# Patient Record
Sex: Male | Born: 1935 | Race: White | Hispanic: No | Marital: Married | Smoking: Former smoker
Health system: Southern US, Community
[De-identification: ages and names within clinical notes are randomized; demographics above are authoritative.]

## PROBLEM LIST (undated history)

## (undated) DIAGNOSIS — M25512 Pain in left shoulder: Secondary | ICD-10-CM

## (undated) DIAGNOSIS — I10 Essential (primary) hypertension: Secondary | ICD-10-CM

## (undated) DIAGNOSIS — M1990 Unspecified osteoarthritis, unspecified site: Secondary | ICD-10-CM

## (undated) DIAGNOSIS — I480 Paroxysmal atrial fibrillation: Secondary | ICD-10-CM

## (undated) DIAGNOSIS — H9190 Unspecified hearing loss, unspecified ear: Secondary | ICD-10-CM

## (undated) DIAGNOSIS — F419 Anxiety disorder, unspecified: Secondary | ICD-10-CM

## (undated) HISTORY — PX: HIP CLOSED REDUCTION: SHX983

## (undated) HISTORY — DX: Essential (primary) hypertension: I10

## (undated) HISTORY — PX: JOINT REPLACEMENT: SHX530

## (undated) HISTORY — DX: Paroxysmal atrial fibrillation: I48.0

## (undated) HISTORY — PX: APPENDECTOMY: SHX54

## (undated) HISTORY — PX: TONSILLECTOMY: SUR1361

## (undated) HISTORY — PX: HERNIA REPAIR: SHX51

---

## 2000-04-23 ENCOUNTER — Encounter: Payer: Self-pay | Admitting: Family Medicine

## 2000-04-23 ENCOUNTER — Encounter: Admission: RE | Admit: 2000-04-23 | Discharge: 2000-04-23 | Payer: Self-pay | Admitting: Family Medicine

## 2001-07-11 ENCOUNTER — Ambulatory Visit (HOSPITAL_COMMUNITY): Admission: RE | Admit: 2001-07-11 | Discharge: 2001-07-11 | Payer: Self-pay | Admitting: Gastroenterology

## 2001-07-11 ENCOUNTER — Encounter (INDEPENDENT_AMBULATORY_CARE_PROVIDER_SITE_OTHER): Payer: Self-pay

## 2001-10-31 ENCOUNTER — Ambulatory Visit (HOSPITAL_COMMUNITY): Admission: RE | Admit: 2001-10-31 | Discharge: 2001-10-31 | Payer: Self-pay | Admitting: Gastroenterology

## 2001-10-31 ENCOUNTER — Encounter: Payer: Self-pay | Admitting: Gastroenterology

## 2002-10-19 ENCOUNTER — Encounter: Payer: Self-pay | Admitting: Family Medicine

## 2002-10-19 ENCOUNTER — Ambulatory Visit (HOSPITAL_COMMUNITY): Admission: RE | Admit: 2002-10-19 | Discharge: 2002-10-19 | Payer: Self-pay | Admitting: Family Medicine

## 2002-11-27 ENCOUNTER — Ambulatory Visit (HOSPITAL_COMMUNITY): Admission: RE | Admit: 2002-11-27 | Discharge: 2002-11-27 | Payer: Self-pay

## 2004-03-25 ENCOUNTER — Encounter: Admission: RE | Admit: 2004-03-25 | Discharge: 2004-03-25 | Payer: Self-pay | Admitting: Family Medicine

## 2004-10-27 ENCOUNTER — Encounter: Admission: RE | Admit: 2004-10-27 | Discharge: 2004-10-27 | Payer: Self-pay | Admitting: Family Medicine

## 2004-11-18 ENCOUNTER — Encounter: Admission: RE | Admit: 2004-11-18 | Discharge: 2004-11-18 | Payer: Self-pay | Admitting: Family Medicine

## 2005-04-28 ENCOUNTER — Encounter: Admission: RE | Admit: 2005-04-28 | Discharge: 2005-04-28 | Payer: Self-pay | Admitting: Family Medicine

## 2005-06-10 ENCOUNTER — Inpatient Hospital Stay (HOSPITAL_COMMUNITY): Admission: RE | Admit: 2005-06-10 | Discharge: 2005-06-15 | Payer: Self-pay | Admitting: Orthopedic Surgery

## 2005-06-10 ENCOUNTER — Ambulatory Visit: Payer: Self-pay | Admitting: Physical Medicine & Rehabilitation

## 2007-01-28 ENCOUNTER — Encounter: Admission: RE | Admit: 2007-01-28 | Discharge: 2007-01-28 | Payer: Self-pay | Admitting: Family Medicine

## 2007-04-12 ENCOUNTER — Emergency Department (HOSPITAL_COMMUNITY): Admission: EM | Admit: 2007-04-12 | Discharge: 2007-04-12 | Payer: Self-pay | Admitting: Emergency Medicine

## 2007-09-12 ENCOUNTER — Ambulatory Visit (HOSPITAL_COMMUNITY): Admission: EM | Admit: 2007-09-12 | Discharge: 2007-09-12 | Payer: Self-pay | Admitting: Emergency Medicine

## 2007-10-28 ENCOUNTER — Ambulatory Visit (HOSPITAL_COMMUNITY): Admission: EM | Admit: 2007-10-28 | Discharge: 2007-10-28 | Payer: Self-pay | Admitting: Emergency Medicine

## 2007-11-17 ENCOUNTER — Inpatient Hospital Stay (HOSPITAL_COMMUNITY): Admission: RE | Admit: 2007-11-17 | Discharge: 2007-11-23 | Payer: Self-pay | Admitting: Orthopedic Surgery

## 2007-12-27 ENCOUNTER — Ambulatory Visit (HOSPITAL_COMMUNITY): Admission: EM | Admit: 2007-12-27 | Discharge: 2007-12-27 | Payer: Self-pay | Admitting: Emergency Medicine

## 2008-02-08 ENCOUNTER — Inpatient Hospital Stay (HOSPITAL_COMMUNITY): Admission: EM | Admit: 2008-02-08 | Discharge: 2008-02-14 | Payer: Self-pay | Admitting: Emergency Medicine

## 2008-02-14 ENCOUNTER — Ambulatory Visit: Payer: Self-pay | Admitting: Family Medicine

## 2010-03-16 HISTORY — PX: NM MYOVIEW LTD: HXRAD82

## 2010-07-29 NOTE — Op Note (Signed)
NAMECHOYA, TORNOW NO.:  [id number]   MEDICAL RECORD NO.:  [id number]          PATIENT TYPE:  INP   LOCATION:  0098                         FACILITY:  [facility]   PHYSICIAN:  Vanita Panda. Magnus Ivan, M.D.DATE OF BIRTH:  1927/03/21   DATE OF PROCEDURE:  10/28/2007  DATE OF DISCHARGE:  10/28/2007                               OPERATIVE REPORT   PREOPERATIVE DIAGNOSIS:  Right prosthetic hip dislocation.   POSTOPERATIVE DIAGNOSIS:  Right prosthetic hip dislocation.   PROCEDURE:  Closed reduction with manipulation of right prosthetic hip  dislocation under anesthesia.   ANESTHESIA:  General.   SURGEON:  Vanita Panda. Magnus Ivan, M.D.   COMPLICATIONS:  None.   INDICATIONS:  Briefly, Mr. Saunders is a 77 year old with a right total  hip replacement.  He has had three dislocations now of his right total  hip arthroplasty, with the last dislocation being in June 2009, and a  closed reduction with manipulation under anesthesia was performed then.  Today, he was sitting down on the ground to sit with a grandchild when  the hip popped out of place.  He had the inability to ambulate and  obvious dislocation of the hip, with shortening and internal rotation.  He was brought via EMS to the [facility] Emergency Room.  I saw him in  the emergency room and recommended that he have a closed reduction  performed.  X-rays were obtained and showed a prosthetic hip dislocation  with no obvious fracture.  Risks and benefits of closed reduction under  anesthesia were explained him, and he agreed to proceed with surgery.   PROCEDURE DESCRIPTION:  After informed consent was obtained, the  appropriate right leg was marked.  He was brought to the operating room  and placed supine on the operating room table.  General anesthesia was  then obtained.  Time-out was called, and he was identified as the  correct patient and the correct right hip.  Then, with his pelvis  stabilized, a  reduction maneuver was performed, and reduction was  performed with an audible and palpable click of the hip.  Under direct  fluoroscopic guidance, I then put the hip through a range of motion.  It  was stable.  His leg lengths were equal, and I placed his right knee in  a knee immobilizer.  He was awakened, extubated, and taken to the  recovery room in stable condition.  Postoperatively, we will allow him  to weightbear as tolerated with a knee immobilizer, with strict  adherence to posterior hip precautions.  Follow-up appointment will be  established in the office in 1-2 weeks.      Vanita Panda. Magnus Ivan, M.D.  Electronically Signed     CYB/MEDQ  D:  10/28/2007  T:  10/29/2007  Job:  [id number]

## 2010-07-29 NOTE — Op Note (Signed)
Tom Ortiz, Tom Ortiz              ACCOUNT NO.:  [id number]   MEDICAL RECORD NO.:  [id number]          PATIENT TYPE:  EMS   LOCATION:  ED                           FACILITY:  [facility]   PHYSICIAN:  Mark C. Ophelia Charter, M.D.    DATE OF BIRTH:  [birth year]   DATE OF PROCEDURE:  12/27/2007  DATE OF DISCHARGE:                               OPERATIVE REPORT   PREOPERATIVE DIAGNOSIS:  Right posterior hip dislocation.   POSTOPERATIVE DIAGNOSIS:  Right posterior hip dislocation.   PROCEDURE:  Closed reduction right total hip arthroplasty.   SURGEON:  Annell Greening, MD   ANESTHESIA:  GOT.   BRIEF HISTORY:  This 77 year old male had total hip arthroplasty by Dr.  Lajoyce Corners a couple years ago.  He has had a total of three dislocations  before this.  He has had a revision procedure with liner repositioning.  The patient was asleep lying on his left side and has right hip over the  front posture with his hip flexed and dislocated.   Preop x-ray showed posterior hip dislocation.  Good position of the  components by radiograph.   PROCEDURE:  After induction of general anesthesia with succinylcholine  given, longitudinal traction was applied with 90-90 positioning and the  hip popped in with a clunk.  There was good leg length, normal range of  motion, normal abduction, knee immobilizer applied and plain radiograph  showed the hip was reduced.  Knee immobilizer applied.  Transferred to  the recovery room.  He will stay in the knee immobilizer for 6 weeks.      Mark C. Ophelia Charter, M.D.  Electronically Signed     MCY/MEDQ  D:  12/27/2007  T:  12/28/2007  Job:  [id number]

## 2010-07-29 NOTE — Discharge Summary (Signed)
[name], Tom Ortiz              ACCOUNT NO.:  [id number]   MEDICAL RECORD NO.:  [id number]          PATIENT TYPE:  INP   LOCATION:  1306                         FACILITY:  [facility]   PHYSICIAN:  Hillery Aldo, M.D.   DATE OF BIRTH:  1927/10/28   DATE OF ADMISSION:  02/08/2008  DATE OF DISCHARGE:  02/14/2008                               DISCHARGE SUMMARY   PRIMARY CARE PHYSICIAN:  Shary Decamp, MD.   ORTHOPEDIC SURGEON:  Nadara Mustard, MD.   DISCHARGE DIAGNOSES:  1. T9-T10 compression fractures.  2. Frequent falls.  3. Failure to thrive with generalized weakness.  4. Altered mental status.  5. History of rheumatoid arthritis.  6. History of depression.  7. Small vessel cerebrovascular disease.   DISCHARGE MEDICATIONS:  1. Valium 2.5 mg p.o. b.i.d.  2. Senokot 1 tablet q.h.s.  3. Oxycodone 5 mg p.o. q.4 h. p.r.n.  4. Ambien 5 mg p.o. q.h.s. p.r.n.   CONSULTATIONS:  Nadara Mustard, MD of orthopedic surgery.   BRIEF ADMISSION HISTORY OF PRESENT ILLNESS:  The patient is a 77-year-  old male who presented to the hospital with a chief complaint of fall  and altered mental status.  The patient recently had a bilateral total  hip arthroplasty performed by Dr. Lajoyce Corners on November 17, 2007.  He was  found by his wife on the bathroom floor on the date of admission.  He  was subsequently brought to the emergency department where his wife  requested placement in a skilled nursing facility for rehab due to her  inability to adequately care for him at home.  The patient was noted to  have an unsteady gait and intermittent confusion, therefore was admitted  for further evaluation and workup.  For full details, please see the  dictated report done by Dr. Christella Noa.   PROCEDURES/DIAGNOSTIC STUDIES:  1. Two views of the thoracic spine on February 08, 2008 showed      possible new mild superior endplate compression deformities at T9-      T10 with normal alignment.  2. Right ankle films on  February 08, 2008 showed no acute osseous      findings.  3. Left hip films on February 08, 2008 showed no evidence of acute      fracture or dislocation status post bilateral total hip      arthroplasty.  4. Lumbar spine films on February 08, 2008 showed stable chronic      compression deformities and spondylosis with no evidence of acute      lumbar spine injury.  5. CT scan of the head on February 08, 2008 showed no acute      intracranial findings.  Mild chronic small vessel ischemic changes.  6. Chest x-ray on February 08, 2008 showed mild pulmonary vascular      prominence.   LABORATORY DATA:  Discharge laboratory values:  Vitamin B12 was 1296.  RBC folate was 773.  RPR was nonreactive.  TSH was 1.323.   HOSPITAL COURSE:  1. T9-T10 compression fracture:  The patient was evaluated in the      emergency  department by the orthopedic surgeon who recommended a      thoracic lumbar brace and VertAlign.  This was provided for the      patient and recommendations by physical and occupational therapist      were for skilled nursing home placement for intensive rehab.  No      further diagnostic imaging was recommended.  2. Frequent falls with failure to thrive:  Reversible causes of      dementia workup was unrevealing.  The patient's frequent falls and      failure to thrive were felt to be deconditioning in the setting of      a recent major surgery on his bilateral hips.  The patient would      benefit from skilled nursing home placement for rehab.  3. Altered mental status:  The patient's mental status alteration was      transient and likely related to pain or anxiolytic medications.  He      also has evidence of small vessel disease on CT scanning of the      head and likely has some underlying dementia.  4. History of rheumatoid arthritis:  The patient's pain is currently      well controlled.   DISPOSITION:  The patient is medically stable for transfer to a skilled  nursing  facility for further  intensive rehabilitation.   FOLLOW UP:  He should follow up with his primary care physician in 1-2  weeks' time.      Hillery Aldo, M.D.  Electronically Signed     CR/MEDQ  D:  02/14/2008  T:  02/14/2008  Job:  [id number]   cc:   Nadara Mustard, MD  Fax: [phone]   Shary Decamp, MD

## 2010-07-29 NOTE — Op Note (Signed)
NAMEJEFREY, RABURN NO.:  [id number]   MEDICAL RECORD NO.:  [id number]          PATIENT TYPE:  OBV   LOCATION:  0098                         FACILITY:  [facility]   PHYSICIAN:  Vanita Panda. Magnus Ivan, M.D.DATE OF BIRTH:  1927-11-18   DATE OF PROCEDURE:  09/12/2007  DATE OF DISCHARGE:                               OPERATIVE REPORT   PREOPERATIVE DIAGNOSIS:  Right prosthetic hip dislocation.   POSTOPERATIVE DIAGNOSIS:  Same.   PROCEDURE:  Closed reduction of right hip with manipulation under  general anesthesia.   SURGEON:  Doneen Poisson, MD   ANESTHESIA:  General.   COMPLICATIONS:  None.   INDICATIONS:  Briefly, Mr. Hausman is a 77 year old who has a right  total hip replacement that has become dislocated.  He had the original  hip placed in March 2007.  He had been doing well until it dislocated in  January 2009, and this required closed reduction maneuver.  He had been  doing well until today, when he was in his garage and he was slightly  bending over, getting onto a stool, when he felt a pop in his hip again.  He had inability to ambulate, and was brought via EMS to the [facility]  Emergency Room.  He was found to have a prosthetic hip dislocation  without evidence of fracture.  It was recommended he undergo closed  reduction of this under anesthesia.  The risks, benefits of this were  explained to him and well understood.  He agreed to proceed with  surgery.   PROCEDURE DESCRIPTION:  After informed consent was obtained, the  appropriate right leg was marked.  He was brought to the operating and  placed supine on the operating table.  General anesthesia was obtained  and a time-out was called to identify the correct patient, correct right  hip.  I then visualized the hip under direct fluoroscopy to look at the  position of the large hip ball.  I was able to perform a reduction  maneuver and get this reduced in the socket and direct  visualization,  under direct fluoroscopy put it through a range of motion.  It was  stable.  I then placed his leg in a knee immobilizer.  He was awakened,  extubated and taken to the recovery room in stable condition.  Postoperatively, I will have him follow up with Dr. Lajoyce Corners in the next  week.  He will continued the knee immobilizer at all times, except for a  shower, and will have him sleep with toes between his legs.      Vanita Panda. Magnus Ivan, M.D.  Electronically Signed     CYB/MEDQ  D:  09/12/2007  T:  09/12/2007  Job:  [id number]

## 2010-07-29 NOTE — H&P (Signed)
Tom Ortiz, Tom Ortiz              ACCOUNT NO.:  [id number]   MEDICAL RECORD NO.:  [id number]          PATIENT TYPE:  INP   LOCATION:  0113                         FACILITY:  [facility]   PHYSICIAN:  Herbie Saxon, MDDATE OF BIRTH:  September 23, 1927   DATE OF ADMISSION:  02/08/2008  DATE OF DISCHARGE:                              HISTORY & PHYSICAL   PRIMARY CARE PHYSICIAN:  Thayer Headings, M.D.   ORTHOPEDIC SURGEON:  Nadara Mustard, M.D.   CODE STATUS:  Full code.   PRESENTING COMPLAINT:  Fall with confusion, one day.   HISTORY OF PRESENTING COMPLAINT:  This 77 year old Caucasian male with a  history of rheumatoid arthritis, who is status post bilateral total hip  arthroplasty performed by Dr. Lajoyce Corners on November 17, 2007, was found by  his wife this morning on the bathroom floor.  He had apparently fallen  while trying to get to the bathroom the previous night.  He was confused  when found.  There was no obvious blunt trauma to the head, face, limbs,  or trunk.  Patient was subsequently put in the bed by the wife and  neighbor and brought to the emergency room subsequently.  Presented with  a complaint of any symptoms.  He denies any chest pain, shortness of  breath, palpitations.  No abdominal or genitourinary symptoms.  He has  had chronic intermittent right pedal swelling in the last 3 months.  Patient's wife lives alone with him.  She is keen on him being placed in  a nursing facility for rehab, as she cannot cope with him alone.  She  gives a history that he has frequent falls.  He has an unsteady gait and  is intermittently confused.   PAST MEDICAL HISTORY:  1. Rheumatoid arthritis.  2. Depression.   FAMILY HISTORY:  Mother had a CVA.  Father had rheumatoid arthritis.   SOCIAL HISTORY:  He lives with his wife.  There is no history of  alcohol, tobacco, or drug abuse.   MEDICATIONS:  Valium p.r.n.   ALLERGIES:  No known drug allergies.   PAST SURGICAL HISTORY:   Bilateral total hip arthroplasty, as previously  stated.   PHYSICAL EXAMINATION:  He is an elderly man not in acute distress.  Temperature is 100.5, pulse 95, respiratory rate 18, blood pressure  140/84.  Pupils are equal, round and reactive to light and accommodation.  Head  is normocephalic and atraumatic.  No scalp irregularity.  Oropharynx and  nasopharynx are clear.  Mucous membranes are moist.  NECK:  Supple.  No JVD or thyromegaly.  SPINE:  There is minimal tenderness over the thoracic spine.  Minimal  tenderness also over the lumbosacral spine.  Heart sounds 1 and 2, regular.  No murmurs, rubs or gallops.  CHEST:  Clinically clear.  No rales or rhonchi.  ABDOMEN:  Benign.  Bowel sounds present.  EXTREMITIES:  Reduced range of movement, especially in the right hip.  Power in the right lower extremity, 3/5.  Power in the left lower  extremity is +4/5.  Power in the right upper extremity is +4.  Power  in  the left upper extremity 4+.  There is no facial droop.  No nystagmus.  Speech is normal.  No pronator drift.  Patient is not oriented to time,  although he is oriented to person and place.  Peripheral pulses are  present.  Trace right pedal edema.   LABS:  WBC is 10.5, hematocrit 37, platelet count 256.  Troponin less  than 0.05.  INR is 1.4.  PTT is 41.  Urinalysis is negative.   X-RAYS:  1. The T-spine shows questionable new T9-10 compression fracture.  2. Right ankle x-ray is normal.  3. Left hip x-ray shows osteopenia.  Patient is status post bilateral      total hip arthroplasty.  4. Lumbosacral shows chronic compression deformities and spondylosis.  5. CT head is negative.  6. Chest x-ray shows no infiltrates.   EKG:  Normal sinus rhythm at 89 per minute.   CT head shows no acute abnormalities.   ASSESSMENT:  1. Altered mental status.  2. Failure to thrive.  3. Deconditioning.  4. Questionable new T9-10 compression fracture.  5. History of rheumatoid  arthritis.  6. Lumbosacral spondylosis.   Patient is to be admitted to a medical-surgical bed.  Will discuss the  patient's case with orthopedics, Dr. Lajoyce Corners, who advocating no MRI at  present.  He is advocating a lumbosacral spine brace and analgesics and  is also recommending rehab placement.  Patient is to be followed up with  him in a week or two as an outpatient once he is in a nursing facility.   MEDICATIONS:  1. Tylenol 650 mg p.o. p.r.n. for mild pain or fever.  2. Oxycodone 5 mg q.4-6h. p.r.n. moderate pain.  3. Dilaudid 1 mg IV q.4h. p.r.n. severe pain.  4. Lovenox 40 mg subcu daily for DVT prophylaxis.  5. Phenergan alternating with Reglan IV p.r.n. nausea.  6. Protonix 40 mg p.o. daily.   We will obtain his cardiac enzymes x2.  Check his EKG q.6h. x2.  Will  obtain his TSH, RPR, vitamin B12, and folate levels.  IV at keep vein  open.   DIET:  Regular as tolerated.   OXYGEN:  At 2-4 liters nasal cannula p.r.n.  Keep sats greater than 90.   He is to be on bedrest for the first 24 hours.  He is to be assessed for  PT/OT evaluation within the next 24 hours.  Also, speech therapy to  evaluate for increased swallowing evaluation.  He is to be on fall,  decubitus, aspiration, and seizure precautions.   His medications and tests were explained to his wife.  He and she  verbalized understanding.      Herbie Saxon, MD  Electronically Signed     MIO/MEDQ  D:  02/08/2008  T:  02/08/2008  Job:  [id number]   cc:   Thayer Headings, M.D.   Nadara Mustard, MD  Fax: [phone]

## 2010-07-29 NOTE — Op Note (Signed)
NAMEKALONJI, ZURAWSKI              ACCOUNT NO.:  [id number]   MEDICAL RECORD NO.:  [id number]          PATIENT TYPE:  INP   LOCATION:  5002                         FACILITY:  [facility]   PHYSICIAN:  Nadara Mustard, MD     DATE OF BIRTH:  [birth year]   DATE OF PROCEDURE:  11/17/2007  DATE OF DISCHARGE:                               OPERATIVE REPORT   PREOPERATIVE DIAGNOSIS:  Dislocating right hip 2 years status post total  hip arthroplasty.   POSTOPERATIVE DIAGNOSIS:  Dislocating right hip 2 years status post  total hip arthroplasty.   PROCEDURE:  Revision right total hip arthroplasty, 2 components, with a  62-mm DePuy Pinnacle acetabulum, 10-degree +4 poly liner, 36-mm head  plus a 12-mm neck.   SURGEON:  Nadara Mustard, MD   ASSISTANT:  Wende Neighbors, PA   ANESTHESIA:  General.   ESTIMATED BLOOD LOSS:  200 mL.   ANTIBIOTICS:  A 2 g of Kefzol.   DRAINS:  None.   COMPLICATIONS:  None.   DISPOSITION:  To PACU in stable condition.   INDICATIONS FOR PROCEDURE:  The patient is a 77 year old gentleman who  is 2 years status post total hip arthroplasty.  He has had 3 episodes of  dislocations most recently.  In the most recent one, the patient states  it curved when he went to sit down on the floor.  Due to his most recent  dislocations 2 years status post total hip arthroplasty, the patient  states he would like to proceed with revision total hip arthroplasty.  Risks and benefits were discussed including infection, neurovascular  injury, persistent pain, recurrent dislocation, need for additional  surgery.  The patient states he understands and wished to proceed at  this time.   DESCRIPTION OF PROCEDURE:  The patient was brought to the OR room #4 and  underwent a general anesthetic.  After adequate level of anesthesia was  obtained, the patient was placed in a left lateral decubitus position  with the right side up.  The right lower extremity was prepped using  DuraPrep and  draped in a sterile field.  Collier Flowers was used to cover all  exposed skin.  A posterolateral incision was made.  This was carried  down through the tensor fascia lata, which was split.  Examination  showed a significant amount of inflammation within the joint consistent  with the patient's history of rheumatoid arthritis.  The fluid was sent  for Gram-stain and cultures.  At the time of surgery, all cultures and  Gram-stains were negative.  The wound was irrigated with pulsatile  lavage and inflammatory tissue was removed from the knee joint.  The hip  was dislocated, and the femoral head and neck were removed.  Attention  was then focused on the acetabulum.  Using the acetabulum elevators, the  acetabulum was removed.  The acetabulum had a good coverage and the 62-  mm reamer was then used to ream down to bleeding viable subchondral  bone.  There was a good fit all the way around the acetabulum, and a 62-  mm Pinnacle acetabulum  was inserted with a trial 10-degree poly liner.  This was then tried with a 36-mm head with increasing femoral neck.  Neck offsets eventually went to a +12 head neck.  The patient had  flexion to 130 degrees.  This was stable.  He had full abduction and  internal rotation of 70 degrees and the hip was still stable.  Extension  and external rotation of the hip was also stable.  Shuck test showed  approximately 2 mm of shuck and this was stable with the shuck test.  The trial liner and trial femoral head and neck were removed.  The wound  was irrigated with pulsatile lavage throughout the case.  The 10-degree  poly liner was placed with the acetabulum at about 45 degrees of  abduction and 20 degrees of anteversion.  The poly liner was placed  superior and posterior with a 10-degree lip.  This was impacted.  The  head and neck were then inserted with a 36-mm head and a 12-mm neck.  This was inserted.  The hip was reduced.  Again placed through a full  range of motion, he  had flexion to 130 degrees.  He had full adduction  to 90 degrees and internal rotation of 70 degrees and the hip was  stable.  With the hip extended and externally rotated, the hip was also  stable.  Again, the wound was irrigated with pulsatile lavage.  The  capsule was closed.  The tensor fascia lata was closed using #1 Vicryl.  Subcu was closed using 2-0 Vicryl.  The skin was closed using Proximate  staples.  The wound was covered with a Mepilex dressing.  The patient  was extubated, transferred supine.  A pillow was placed between his  legs.  He was then taken to the PACU in stable condition.  Plan for  discharge to home.      Nadara Mustard, MD  Electronically Signed     MVD/MEDQ  D:  11/17/2007  T:  11/18/2007  Job:  [id number]

## 2010-08-01 NOTE — Discharge Summary (Signed)
Tom Ortiz, Tom Ortiz              ACCOUNT NO.:  [id number]   MEDICAL RECORD NO.:  [id number]          PATIENT TYPE:  INP   LOCATION:  5002                         FACILITY:  [facility]   PHYSICIAN:  Nadara Mustard, MD     DATE OF BIRTH:  Apr 02, 1927   DATE OF ADMISSION:  11/17/2007  DATE OF DISCHARGE:  11/23/2007                               DISCHARGE SUMMARY   FINAL DIAGNOSIS:  Unstable right total hip arthroplasty with multiple  dislocations.   PROCEDURE:  Revision total hip arthroplasty on the right with both the  acetabular and femoral components.   DISPOSITION:  Discharged to home in stable condition with home health  physical therapy.   PRESCRIPTION:  Vicodin and Coumadin.   FOLLOWUP:  In the office in 2 weeks.   HISTORY OF PRESENT ILLNESS:  The patient is a 77 year old gentleman who  is a several years status post total hip arthroplasty on the right.  The  patient started developing instability with dislocations of his hip, and  due to 3 dislocations, the patient was to proceed with revision total  hip arthroplasty.  The patient underwent a revision total hip  arthroplasty on November 17, 2007.  He had a 62-mm acetabulum placed  with a 10-degree liner with +4 polyethylene liner with a 36-mm head and  the posterior wall of the neck.  Cultures were obtained x2, which  ultimately were negative.   Postoperatively, the patient progressed slowly with his physical  therapy.  Radiographs showed stable alignment of his hip.  He continued  to work with physical therapy and was discharged to home in stable  condition on November 23, 2007, with follow up in the office in 2 weeks.      Nadara Mustard, MD  Electronically Signed     MVD/MEDQ  D:  01/04/2008  T:  01/04/2008  Job:  [id number]

## 2010-08-01 NOTE — Discharge Summary (Signed)
Tom Ortiz, Tom Ortiz              ACCOUNT NO.:  [id number]   MEDICAL RECORD NO.:  [id number]          PATIENT TYPE:  INP   LOCATION:  5023                         FACILITY:  [facility]   PHYSICIAN:  Nadara Mustard, MD     DATE OF BIRTH:  15-May-1927   DATE OF ADMISSION:  06/10/2005  DATE OF DISCHARGE:  06/15/2005                                 DISCHARGE SUMMARY   DIAGNOSIS:  Rheumatoid arthritis right hip.   PROCEDURE:  Right total hip arthroplasty.  Discharged to home in stable  condition.  Plan to follow up in the office in one week.   HISTORY OF PRESENT ILLNESS:  Patient is a 77 year old gentleman with  rheumatoid arthritis of the right hip.  Patient has failed conservative  care.  He is status post a left total hip arthroplasty in 1993 and presents  at this time for a right total hip arthroplasty.  Patient's hospital course  was essentially unremarkable.  He underwent a right total hip arthroplasty  on March 28.  He received DePuy components with metal on metal bearing  surfaces with a 62 mm acetabulum #14 femur 55 mm head with a +2 neck.  Patient received Kefzol for infection prophylaxis and Coumadin for DVT  prophylaxis.  Patient progressed well postoperatively with his physical  therapy.  Patient was felt to be safe for discharge to home with home health  therapy on April 2 with follow-up in the office in one week with Coumadin  for continued DVT prophylaxis.      Nadara Mustard, MD  Electronically Signed     MVD/MEDQ  D:  07/21/2005  T:  07/22/2005  Job:  [id number]

## 2010-08-01 NOTE — Op Note (Signed)
NAMEANTWANE, Tom Ortiz              ACCOUNT NO.:  [id number]   MEDICAL RECORD NO.:  [id number]          PATIENT TYPE:  INP   LOCATION:  2899                         FACILITY:  [facility]   PHYSICIAN:  Nadara Mustard, MD     DATE OF BIRTH:  Oct 16, 1927   DATE OF PROCEDURE:  06/10/2005  DATE OF DISCHARGE:                                 OPERATIVE REPORT   PREOP DIAGNOSIS:  Rheumatoid arthritis right hip.   POSTOP DIAGNOSIS:  Rheumatoid arthritis right hip.   PROCEDURE:  Right total hip arthroplasty with DePuy components 62 mm metal-  on-metal acetabulum.  A size 14 Corail femur 55 mm metal head with a +2 high  offset neck.   SURGEON:  Nadara Mustard, MD   ANESTHESIA:  General.   ESTIMATED BLOOD LOSS:  Minimal.   ANTIBIOTICS:  1 gram Kefzol.   DRAINS:  None.   COMPLICATIONS:  None.   DISPOSITION:  To PACU in stable condition.   INDICATIONS FOR PROCEDURE:  The patient is a 77 year old gentleman with  rheumatoid arthritis status post a left total hip arthroplasty.  The patient  has had persistent mechanical symptoms of his right hip unable to perform  activities of daily living due to pain and wish to proceed with total hip  arthroplasty at this time.  Risks and benefits were discussed including  infection, neurovascular injury, persistent pain, dislocation, DVT,  pulmonary embolus.  The patient states he understands and wished to proceed  at this time.   DESCRIPTION OF PROCEDURE:  The patient was brought to OR room 1 and  underwent a general anesthetic.  After adequate level of anesthesia  obtained, the patient was placed in the left lateral decubitus position with  the right side up and the right lower extremity was prepped using DuraPrep  and draped in a sterile field.  Collier Flowers was used to cover all exposed skin.  A  posterior lateral incision was made.  This was carried down.  The tensor  fascia lata was split and a Charnley retractor was placed.  The piriformis  and short  external rotators were tagged, cut and retracted.  The capsule was  teed, tagged, cut and retracted.  The sciatic nerve was protected throughout  the case.  The hip was dislocated and the femoral neck cut was made 1 cm  proximal to calcar in alignment with the femoral template.  Attention was  first paid to the acetabulum.  Large loose bodies were removed from the  acetabulum.  The labrum was excised.  The acetabulum was sequentially reamed  up to 62 mm.  The 62 mm ASR cup was inserted at approximately 45 degrees of  abduction and approximately a 20 degrees of flexion.  Attention was then  focused on the femur.  The femur was sequentially broached up to a size 14.  This was trialed and had good stability with flexion of the 110 degrees,  full abduction and internal rotation of approximately 50 degrees and this  was stable.  This was tried with both the standard and high offset and the  high offset  had more stability.  This trial was removed.  The final femoral  stem was inserted with a 55-mm head, high offset +2 neck.  Again the hip was  placed through full range of motion and there was improved range of motion.  There was flexion to 110 degrees, full abduction with internal rotation of  70 degrees and the hip was stable.  There was full extension and external  rotation.  The wound was irrigated with normal saline throughout the case.  The capsule was repaired using a #1 Ethibond, the piriformis and short  external rotators were reapproximated.  The tensor fascia lata was closed  using #1 Vicryl.  Subcu was closed using 2-0 Vicryl.  The skin was closed  using Proximate staples.  The wound was covered with Adaptic orthopedic  sponges, ABD dressing and Ioban drape.  The patient was extubated.  Knee  immobilizer was applied.  The leg lengths were equal and he was taken to  PACU in stable condition.      Nadara Mustard, MD  Electronically Signed     MVD/MEDQ  D:  06/10/2005  T:   06/11/2005  Job:  [id number]

## 2010-08-18 ENCOUNTER — Other Ambulatory Visit (HOSPITAL_COMMUNITY): Payer: Self-pay | Admitting: Orthopedic Surgery

## 2010-08-18 DIAGNOSIS — T84038A Mechanical loosening of other internal prosthetic joint, initial encounter: Secondary | ICD-10-CM

## 2010-08-18 DIAGNOSIS — Z96649 Presence of unspecified artificial hip joint: Secondary | ICD-10-CM

## 2010-08-18 DIAGNOSIS — M25551 Pain in right hip: Secondary | ICD-10-CM

## 2010-08-29 ENCOUNTER — Encounter (HOSPITAL_COMMUNITY)
Admission: RE | Admit: 2010-08-29 | Discharge: 2010-08-29 | Disposition: A | Discharge status: Home / Self Care | Payer: MEDICARE | Source: Ambulatory Visit | Attending: Orthopedic Surgery | Admitting: Orthopedic Surgery

## 2010-08-29 ENCOUNTER — Ambulatory Visit (HOSPITAL_COMMUNITY)
Admission: RE | Admit: 2010-08-29 | Discharge: 2010-08-29 | Disposition: A | Discharge status: Home / Self Care | Payer: MEDICARE | Source: Ambulatory Visit | Attending: Orthopedic Surgery | Admitting: Orthopedic Surgery

## 2010-08-29 DIAGNOSIS — M25559 Pain in unspecified hip: Secondary | ICD-10-CM | POA: Insufficient documentation

## 2010-08-29 DIAGNOSIS — Y831 Surgical operation with implant of artificial internal device as the cause of abnormal reaction of the patient, or of later complication, without mention of misadventure at the time of the procedure: Secondary | ICD-10-CM | POA: Insufficient documentation

## 2010-08-29 DIAGNOSIS — Z96649 Presence of unspecified artificial hip joint: Secondary | ICD-10-CM

## 2010-08-29 DIAGNOSIS — M25551 Pain in right hip: Secondary | ICD-10-CM

## 2010-08-29 DIAGNOSIS — T84039A Mechanical loosening of unspecified internal prosthetic joint, initial encounter: Secondary | ICD-10-CM | POA: Insufficient documentation

## 2010-08-29 DIAGNOSIS — T84038A Mechanical loosening of other internal prosthetic joint, initial encounter: Secondary | ICD-10-CM

## 2010-08-29 MED ORDER — TECHNETIUM TC 99M MEDRONATE IV KIT
25.0000 | PACK | Freq: Once | INTRAVENOUS | Status: AC | PRN
  Administered 2010-08-29: 25 via INTRAVENOUS

## 2010-12-12 LAB — DIFFERENTIAL
Basophils Absolute: 0
Basophils Relative: 0
Eosinophils Absolute: 0.1
Eosinophils Relative: 1
Lymphocytes Relative: 12
Lymphs Abs: 1.3
Monocytes Absolute: 0.7
Monocytes Relative: 6
Neutro Abs: 8.7 — ABNORMAL HIGH
Neutrophils Relative %: 81 — ABNORMAL HIGH

## 2010-12-12 LAB — CBC
HCT: 44.5
Hemoglobin: 14.8
MCHC: 33.1
MCV: 95.7
Platelets: 230
RBC: 4.65
RDW: 13.3
WBC: 10.7 — ABNORMAL HIGH

## 2010-12-12 LAB — POCT I-STAT, CHEM 8
BUN: 12
Calcium, Ion: 1.04 — ABNORMAL LOW
Chloride: 106
Creatinine, Ser: 1.2
Glucose, Bld: 120 — ABNORMAL HIGH
HCT: 44
Hemoglobin: 15
Potassium: 4.5
Sodium: 139
TCO2: 26

## 2010-12-12 LAB — PROTIME-INR
INR: 1.1
Prothrombin Time: 14.2

## 2010-12-12 LAB — APTT: aPTT: 34

## 2010-12-15 LAB — BASIC METABOLIC PANEL
BUN: 15
CO2: 30
Calcium: 8.3 — ABNORMAL LOW
Chloride: 107
Creatinine, Ser: 0.95
GFR calc Af Amer: >60
GFR calc non Af Amer: >60
Glucose, Bld: 96
Potassium: 4.7
Sodium: 142

## 2010-12-15 LAB — PROTIME-INR
INR: 1.3
Prothrombin Time: 16.1 — ABNORMAL HIGH

## 2010-12-15 LAB — CBC
HCT: 37 — ABNORMAL LOW
Hemoglobin: 12.3 — ABNORMAL LOW
MCHC: 33.2
MCV: 94.7
Platelets: 161
RBC: 3.91 — ABNORMAL LOW
RDW: 14.6
WBC: 8.8

## 2010-12-15 LAB — DIFFERENTIAL
Basophils Absolute: 0.1
Basophils Relative: 1
Eosinophils Absolute: 0
Eosinophils Relative: 0
Lymphocytes Relative: 15
Lymphs Abs: 1.3
Monocytes Absolute: 0.8
Monocytes Relative: 9
Neutro Abs: 6.5
Neutrophils Relative %: 74

## 2010-12-16 LAB — DIFFERENTIAL
Basophils Absolute: 0.1 10*3/uL (ref 0.0–0.1)
Basophils Relative: 1 % (ref 0–1)
Eosinophils Absolute: 0 10*3/uL (ref 0.0–0.7)
Eosinophils Relative: 0 % (ref 0–5)
Lymphocytes Relative: 12 % (ref 12–46)
Lymphs Abs: 1.3 10*3/uL (ref 0.7–4.0)
Monocytes Absolute: 1.2 10*3/uL — ABNORMAL HIGH (ref 0.1–1.0)
Monocytes Relative: 12 % (ref 3–12)
Neutro Abs: 7.9 10*3/uL — ABNORMAL HIGH (ref 1.7–7.7)
Neutrophils Relative %: 76 % (ref 43–77)

## 2010-12-16 LAB — URINALYSIS, ROUTINE W REFLEX MICROSCOPIC
Glucose, UA: NEGATIVE mg/dL
Hgb urine dipstick: NEGATIVE
Ketones, ur: NEGATIVE mg/dL
Leukocytes, UA: NEGATIVE
Nitrite: NEGATIVE
Protein, ur: 30 mg/dL — AB
Specific Gravity, Urine: 1.023 (ref 1.005–1.030)
Urobilinogen, UA: 1 mg/dL (ref 0.0–1.0)
pH: 6 (ref 5.0–8.0)

## 2010-12-16 LAB — VITAMIN B12 BINDING CAPACITY, BLOOD: Vitamin B12 Bind Capacity: 1296 pg/mL (ref 650–1340)

## 2010-12-16 LAB — BASIC METABOLIC PANEL
BUN: 12 mg/dL (ref 6–23)
CO2: 27 mEq/L (ref 19–32)
Calcium: 8.4 mg/dL (ref 8.4–10.5)
Chloride: 104 mEq/L (ref 96–112)
Creatinine, Ser: 0.9 mg/dL (ref 0.4–1.5)
GFR calc Af Amer: >60 mL/min (ref >60)
GFR calc non Af Amer: >60 mL/min (ref >60)
Glucose, Bld: 109 mg/dL — ABNORMAL HIGH (ref 70–99)
Potassium: 4.5 mEq/L (ref 3.5–5.1)
Sodium: 138 mEq/L (ref 135–145)

## 2010-12-16 LAB — CK TOTAL AND CKMB (NOT AT ARMC)
CK, MB: 0.4 ng/mL (ref 0.3–4.0)
CK, MB: 0.5 ng/mL (ref 0.3–4.0)
CK, MB: 0.7 ng/mL (ref 0.3–4.0)
CK, MB: 0.8 ng/mL (ref 0.3–4.0)
CK, MB: 1.1 ng/mL (ref 0.3–4.0)
Relative Index: INVALID (ref 0.0–2.5)
Relative Index: INVALID (ref 0.0–2.5)
Relative Index: INVALID (ref 0.0–2.5)
Relative Index: INVALID (ref 0.0–2.5)
Relative Index: INVALID (ref 0.0–2.5)
Total CK: 22 U/L (ref 7–232)
Total CK: 33 U/L (ref 7–232)
Total CK: 39 U/L (ref 7–232)
Total CK: 45 U/L (ref 7–232)
Total CK: 53 U/L (ref 7–232)

## 2010-12-16 LAB — CBC
HCT: 37.2 % — ABNORMAL LOW (ref 39.0–52.0)
Hemoglobin: 12.3 g/dL — ABNORMAL LOW (ref 13.0–17.0)
MCHC: 33.1 g/dL (ref 30.0–36.0)
MCV: 89.8 fL (ref 78.0–100.0)
Platelets: 256 10*3/uL (ref 150–400)
RBC: 4.15 MIL/uL — ABNORMAL LOW (ref 4.22–5.81)
RDW: 15.1 % (ref 11.5–15.5)
WBC: 10.5 10*3/uL (ref 4.0–10.5)

## 2010-12-16 LAB — POCT CARDIAC MARKERS
CKMB, poc: <1 ng/mL — ABNORMAL LOW (ref 1.0–8.0)
CKMB, poc: <1 ng/mL — ABNORMAL LOW (ref 1.0–8.0)
Myoglobin, poc: 134 ng/mL (ref 12–200)
Myoglobin, poc: 146 ng/mL (ref 12–200)
Troponin i, poc: <0.05 ng/mL (ref 0.00–0.09)
Troponin i, poc: <0.05 ng/mL (ref 0.00–0.09)

## 2010-12-16 LAB — RPR: RPR Ser Ql: NONREACTIVE

## 2010-12-16 LAB — APTT: aPTT: 41 seconds — ABNORMAL HIGH (ref 24–37)

## 2010-12-16 LAB — FOLATE RBC: RBC Folate: 773 ng/mL — ABNORMAL HIGH (ref 180–600)

## 2010-12-16 LAB — TSH: TSH: 1.323 u[IU]/mL (ref 0.350–4.500)

## 2010-12-16 LAB — PHOSPHORUS: Phosphorus: 3 mg/dL (ref 2.3–4.6)

## 2010-12-16 LAB — URINE MICROSCOPIC-ADD ON: Urine-Other: NONE SEEN

## 2010-12-16 LAB — MAGNESIUM: Magnesium: 2.1 mg/dL (ref 1.5–2.5)

## 2010-12-16 LAB — PROTIME-INR
INR: 1.4 (ref 0.00–1.49)
Prothrombin Time: 17.6 seconds — ABNORMAL HIGH (ref 11.6–15.2)

## 2010-12-16 LAB — CALCIUM: Calcium: 8.2 mg/dL — ABNORMAL LOW (ref 8.4–10.5)

## 2010-12-17 LAB — CBC
HCT: 30.2 — ABNORMAL LOW
HCT: 31.5 — ABNORMAL LOW
HCT: 33.7 — ABNORMAL LOW
Hemoglobin: 10.2 — ABNORMAL LOW
Hemoglobin: 10.4 — ABNORMAL LOW
Hemoglobin: 11.6 — ABNORMAL LOW
MCHC: 33.1
MCHC: 33.8
MCHC: 34.4
MCV: 93.3
MCV: 93.4
MCV: 95.5
Platelets: 163
Platelets: 169
Platelets: 207
RBC: 3.23 — ABNORMAL LOW
RBC: 3.3 — ABNORMAL LOW
RBC: 3.61 — ABNORMAL LOW
RDW: 13.4
RDW: 13.6
RDW: 13.9
WBC: 6.3
WBC: 7.2
WBC: 7.3

## 2010-12-17 LAB — BASIC METABOLIC PANEL
BUN: 10
BUN: 11
BUN: 12
CO2: 27
CO2: 28
CO2: 29
Calcium: 7.9 — ABNORMAL LOW
Calcium: 8 — ABNORMAL LOW
Calcium: 8.1 — ABNORMAL LOW
Chloride: 105
Chloride: 105
Chloride: 105
Creatinine, Ser: 0.93
Creatinine, Ser: 1.05
Creatinine, Ser: 1.15
GFR calc Af Amer: >60
GFR calc Af Amer: >60
GFR calc Af Amer: >60
GFR calc non Af Amer: >60
GFR calc non Af Amer: >60
GFR calc non Af Amer: >60
Glucose, Bld: 117 — ABNORMAL HIGH
Glucose, Bld: 125 — ABNORMAL HIGH
Glucose, Bld: 92
Potassium: 4.2
Potassium: 4.3
Potassium: 4.3
Sodium: 136
Sodium: 137
Sodium: 137

## 2010-12-17 LAB — BODY FLUID CULTURE
Culture: NO GROWTH
Gram Stain: NONE SEEN

## 2010-12-17 LAB — PROTIME-INR
INR: 1.3
INR: 2.2 — ABNORMAL HIGH
INR: 2.2 — ABNORMAL HIGH
INR: 2.7 — ABNORMAL HIGH
INR: 2.7 — ABNORMAL HIGH
INR: 3.3 — ABNORMAL HIGH
Prothrombin Time: 16.4 — ABNORMAL HIGH
Prothrombin Time: 25.5 — ABNORMAL HIGH
Prothrombin Time: 26.2 — ABNORMAL HIGH
Prothrombin Time: 30.8 — ABNORMAL HIGH
Prothrombin Time: 31 — ABNORMAL HIGH
Prothrombin Time: 36.4 — ABNORMAL HIGH

## 2010-12-17 LAB — SYNOVIAL CELL COUNT + DIFF, W/ CRYSTALS
Crystals, Fluid: NONE SEEN
Eosinophils-Synovial: 1
Lymphocytes-Synovial Fld: 28 — ABNORMAL HIGH
Monocyte-Macrophage-Synovial Fluid: 17 — ABNORMAL LOW
Neutrophil, Synovial: 54 — ABNORMAL HIGH
WBC, Synovial: 900 — ABNORMAL HIGH

## 2010-12-17 LAB — ANAEROBIC CULTURE: Gram Stain: NONE SEEN

## 2010-12-24 ENCOUNTER — Inpatient Hospital Stay (HOSPITAL_COMMUNITY): Admission: RE | Admit: 2010-12-24 | Payer: MEDICARE | Source: Ambulatory Visit | Admitting: Orthopedic Surgery

## 2010-12-26 NOTE — H&P (Signed)
NAMEKEY, CEN NO.:  [id number]  MEDICAL RECORD NO.:  [id number]  LOCATION:                               FACILITY:  [facility]  PHYSICIAN:  Ollen Gross, M.D.         DATE OF BIRTH:  DATE OF ADMISSION:  12/24/2010 DATE OF DISCHARGE:                             HISTORY & PHYSICAL   CHIEF COMPLAINT:  Right hip pain.  HISTORY OF PRESENT ILLNESS:  The patient is an 77 year old male who has been seen by Dr. Lequita Halt for ongoing continued right hip pain.  He has had previous right hip arthroplasty done about 6-7 years ago by Dr. Lajoyce Corners, ended up problems with multiple recurrent dislocations.  She had an acetabular revision done back in 2009 and still had subsequent dislocations following the revision.  His main problems, he has pain with weightbearing, has pain in his thigh and with any activity.  X-rays show what appears to be a loosening femoral component.  There is no migration, but lytic line around the entire stem, felt to be loose at this time.  Fortunately though he does not appear to be loosening any bone tissue, it is felt due to the loosening, he is definitely going to require some type of revision, revising the femoral component, possibly doing some to the acetabulum also if this is unstable on that side. Findings are discussed with the patient and he elected to proceed with surgery.  Risks and benefits have been discussed with Dr. Lequita Halt.  ALLERGIES:  No known drug allergies.  CURRENT MEDICATIONS:  He is on antidepressant pill, blood pressure pill in the form losartan, and also Valium.  PAST MEDICAL HISTORY: 1. Tinnitus. 2. Anxiety. 3. History of depression. 4. Hypertension. 5. Hemorrhoids. 6. Rheumatoid arthritis. 7. Childhood illnesses of measles and mumps.  PAST SURGICAL HISTORY:  Tonsillectomy in 1940, appendectomy in 1955, hernia repair in 1990.  He had a previous left total hip, had a right total hip done around 2007, and acetabular  revision of the right hip done in 2009.  FAMILY HISTORY:  Father with heart disease.  Mother with stroke.  SOCIAL HISTORY:  Married.  Past smoker.  No alcohol.  He does have a caregiver lined up.  He does have a living will healthcare power of attorney.  REVIEW OF SYSTEMS:  GENERAL APPEARANCE:  No fever, chills, or night sweats.  NEURO:  No seizure, syncope, or paralysis.  RESPIRATORY:  No shortness of breath, productive cough, or hemoptysis.  CARDIOVASCULAR: No chest pain, angina, or orthopnea.  GI:  No nausea, vomiting, diarrhea, or constipation.  GU:  No dysuria, hematuria, or discharge. MUSCULOSKELETAL:  Right hip.  PHYSICAL EXAMINATION:  VITAL SIGNS:  Pulse 76, respirations 12, blood pressure 136/82. GENERAL:  An 77 year old white male, well nourished, well developed, mildly anxious, accompanied by his wife.  He is alert, oriented, cooperative. HEENT:  Normocephalic, atraumatic.  Pupils are round and reactive.  EOMs intact. NECK:  Supple. CHEST:  Clear. HEART:  Regular rate and rhythm without murmur.  S1, S2 noted. ABDOMEN:  Soft, nontender.  Bowel sounds present. RECTAL/BREASTS/GENITALIA:  Not done, not pertinent to present illness. EXTREMITIES:  Right hip, flexion 90,  internal rotation 10, external rotation 10, abduction 20.  He does ambulate with an unsteady gait.  IMPRESSION:  Loosening right femoral component of right total hip.  PLAN:  The patient admitted to [facility] to undergo revision of the right total hip.  Surgery will be performed by Dr. Ollen Gross.     Alexzandrew L. Julien Girt, P.A.C.   ______________________________ Ollen Gross, M.D.    ALP/MEDQ  D:  12/24/2010  T:  12/25/2010  Job:  [id number]  cc:   Georgianne Fick, M.D. Fax: [phone]  Electronically Signed by Patrica Duel P.A.C. on 12/26/2010 06:43:33 AM Electronically Signed by Ollen Gross M.D. on 12/26/2010 04:28:33 PM

## 2011-03-25 ENCOUNTER — Emergency Department (HOSPITAL_COMMUNITY): Payer: MEDICARE | Admitting: *Deleted

## 2011-03-25 ENCOUNTER — Other Ambulatory Visit: Payer: Self-pay

## 2011-03-25 ENCOUNTER — Encounter (HOSPITAL_COMMUNITY)
Admission: EM | Disposition: A | Discharge status: Home / Self Care | Payer: Self-pay | Source: Home / Self Care | Attending: Emergency Medicine

## 2011-03-25 ENCOUNTER — Encounter (HOSPITAL_COMMUNITY): Payer: Self-pay | Admitting: Emergency Medicine

## 2011-03-25 ENCOUNTER — Ambulatory Visit (HOSPITAL_COMMUNITY)
Admission: EM | Admit: 2011-03-25 | Discharge: 2011-03-26 | Disposition: A | Discharge status: Home / Self Care | Payer: MEDICARE | Attending: Specialist | Admitting: Specialist

## 2011-03-25 ENCOUNTER — Encounter (HOSPITAL_COMMUNITY): Payer: Self-pay | Admitting: *Deleted

## 2011-03-25 ENCOUNTER — Emergency Department (HOSPITAL_COMMUNITY): Payer: MEDICARE

## 2011-03-25 DIAGNOSIS — S73006A Unspecified dislocation of unspecified hip, initial encounter: Secondary | ICD-10-CM

## 2011-03-25 DIAGNOSIS — T84029A Dislocation of unspecified internal joint prosthesis, initial encounter: Secondary | ICD-10-CM | POA: Insufficient documentation

## 2011-03-25 DIAGNOSIS — X58XXXA Exposure to other specified factors, initial encounter: Secondary | ICD-10-CM | POA: Insufficient documentation

## 2011-03-25 DIAGNOSIS — Z96649 Presence of unspecified artificial hip joint: Secondary | ICD-10-CM | POA: Insufficient documentation

## 2011-03-25 DIAGNOSIS — I10 Essential (primary) hypertension: Secondary | ICD-10-CM | POA: Insufficient documentation

## 2011-03-25 HISTORY — DX: Essential (primary) hypertension: I10

## 2011-03-25 LAB — PROTIME-INR
INR: 1.06 (ref 0.00–1.49)
Prothrombin Time: 14 seconds (ref 11.6–15.2)

## 2011-03-25 LAB — SAMPLE TO BLOOD BANK

## 2011-03-25 LAB — BASIC METABOLIC PANEL
BUN: 15 mg/dL (ref 6–23)
CO2: 26 mEq/L (ref 19–32)
Calcium: 8.9 mg/dL (ref 8.4–10.5)
Chloride: 99 mEq/L (ref 96–112)
Creatinine, Ser: 1.01 mg/dL (ref 0.50–1.35)
GFR calc Af Amer: 78 mL/min — ABNORMAL LOW (ref >90)
GFR calc non Af Amer: 68 mL/min — ABNORMAL LOW (ref >90)
Glucose, Bld: 95 mg/dL (ref 70–99)
Potassium: 4.5 mEq/L (ref 3.5–5.1)
Sodium: 135 mEq/L (ref 135–145)

## 2011-03-25 LAB — CBC
HCT: 38.5 % — ABNORMAL LOW (ref 39.0–52.0)
Hemoglobin: 12.4 g/dL — ABNORMAL LOW (ref 13.0–17.0)
MCH: 30.5 pg (ref 26.0–34.0)
MCHC: 32.2 g/dL (ref 30.0–36.0)
MCV: 94.6 fL (ref 78.0–100.0)
Platelets: 233 10*3/uL (ref 150–400)
RBC: 4.07 MIL/uL — ABNORMAL LOW (ref 4.22–5.81)
RDW: 13.3 % (ref 11.5–15.5)
WBC: 6.8 10*3/uL (ref 4.0–10.5)

## 2011-03-25 LAB — CARDIAC PANEL(CRET KIN+CKTOT+MB+TROPI)
CK, MB: 2.8 ng/mL (ref 0.3–4.0)
Relative Index: INVALID (ref 0.0–2.5)
Total CK: 41 U/L (ref 7–232)
Troponin I: <0.3 ng/mL (ref <0.30)

## 2011-03-25 LAB — APTT: aPTT: 36 seconds (ref 24–37)

## 2011-03-25 SURGERY — CLOSED MANIPULATION, JOINT, HIP
Anesthesia: General | Site: Hip | Laterality: Left | Wound class: Clean

## 2011-03-25 MED ORDER — SODIUM CHLORIDE 0.9 % IV SOLN: INTRAVENOUS | Status: DC

## 2011-03-25 MED ORDER — SUCCINYLCHOLINE CHLORIDE 20 MG/ML IJ SOLN
INTRAMUSCULAR | Status: DC | PRN
  Administered 2011-03-25: 150 mg via INTRAVENOUS

## 2011-03-25 MED ORDER — ONDANSETRON HCL 4 MG PO TABS
4.0000 mg | ORAL_TABLET | Freq: Four times a day (QID) | ORAL | Status: DC | PRN
  Filled 2011-03-25: qty 1

## 2011-03-25 MED ORDER — METHOCARBAMOL 500 MG PO TABS: 500.0000 mg | ORAL_TABLET | Freq: Four times a day (QID) | ORAL | Status: DC | PRN

## 2011-03-25 MED ORDER — METHOCARBAMOL 100 MG/ML IJ SOLN
500.0000 mg | Freq: Four times a day (QID) | INTRAVENOUS | Status: DC | PRN
  Filled 2011-03-25: qty 5

## 2011-03-25 MED ORDER — ASPIRIN 325 MG PO TABS
325.0000 mg | ORAL_TABLET | Freq: Every day | ORAL | Status: DC
  Administered 2011-03-26: 325 mg via ORAL
  Filled 2011-03-25 (×2): qty 1

## 2011-03-25 MED ORDER — MORPHINE SULFATE 2 MG/ML IJ SOLN: 1.0000 mg | INTRAMUSCULAR | Status: DC | PRN

## 2011-03-25 MED ORDER — PROPOFOL 10 MG/ML IV BOLUS
INTRAVENOUS | Status: DC | PRN
  Administered 2011-03-25: 20 mg via INTRAVENOUS
  Administered 2011-03-25: 180 mg via INTRAVENOUS

## 2011-03-25 MED ORDER — FENTANYL CITRATE 0.05 MG/ML IJ SOLN
50.0000 ug | Freq: Once | INTRAMUSCULAR | Status: AC
  Administered 2011-03-25: 50 ug via INTRAVENOUS
  Filled 2011-03-25: qty 2

## 2011-03-25 MED ORDER — METOCLOPRAMIDE HCL 5 MG PO TABS
5.0000 mg | ORAL_TABLET | Freq: Three times a day (TID) | ORAL | Status: DC | PRN
  Filled 2011-03-25: qty 2

## 2011-03-25 MED ORDER — METOCLOPRAMIDE HCL 5 MG/ML IJ SOLN: 5.0000 mg | Freq: Three times a day (TID) | INTRAMUSCULAR | Status: DC | PRN

## 2011-03-25 MED ORDER — ONDANSETRON HCL 4 MG/2ML IJ SOLN
4.0000 mg | Freq: Once | INTRAMUSCULAR | Status: AC
  Administered 2011-03-25: 4 mg via INTRAVENOUS
  Filled 2011-03-25: qty 2

## 2011-03-25 MED ORDER — MORPHINE SULFATE 4 MG/ML IJ SOLN
4.0000 mg | INTRAMUSCULAR | Status: AC
  Administered 2011-03-25: 4 mg via INTRAVENOUS
  Filled 2011-03-25: qty 1

## 2011-03-25 MED ORDER — ETOMIDATE 2 MG/ML IV SOLN
20.0000 mg | Freq: Once | INTRAVENOUS | Status: AC
  Administered 2011-03-25: 12 mg via INTRAVENOUS
  Filled 2011-03-25: qty 10

## 2011-03-25 MED ORDER — LACTATED RINGERS IV SOLN
INTRAVENOUS | Status: DC
  Administered 2011-03-25: via INTRAVENOUS

## 2011-03-25 MED ORDER — ONDANSETRON HCL 4 MG/2ML IJ SOLN
INTRAMUSCULAR | Status: DC | PRN
  Administered 2011-03-25: 4 mg via INTRAVENOUS

## 2011-03-25 MED ORDER — OXYCODONE-ACETAMINOPHEN 5-325 MG PO TABS: 1.0000 | ORAL_TABLET | ORAL | Status: DC | PRN

## 2011-03-25 MED ORDER — LIDOCAINE HCL (CARDIAC) 20 MG/ML IV SOLN
INTRAVENOUS | Status: DC | PRN
  Administered 2011-03-25: 50 mg via INTRAVENOUS

## 2011-03-25 MED ORDER — SODIUM CHLORIDE 0.9 % IV SOLN
Freq: Once | INTRAVENOUS | Status: AC
  Administered 2011-03-25: 20:00:00 via INTRAVENOUS

## 2011-03-25 MED ORDER — FENTANYL CITRATE 0.05 MG/ML IJ SOLN
INTRAMUSCULAR | Status: DC | PRN
  Administered 2011-03-25 (×2): 50 ug via INTRAVENOUS

## 2011-03-25 MED ORDER — ONDANSETRON HCL 4 MG/2ML IJ SOLN: 4.0000 mg | Freq: Four times a day (QID) | INTRAMUSCULAR | Status: DC | PRN

## 2011-03-25 MED ORDER — SODIUM CHLORIDE 0.9 % IV SOLN
INTRAVENOUS | Status: DC | PRN
  Administered 2011-03-25: 22:00:00 via INTRAVENOUS

## 2011-03-25 SURGICAL SUPPLY — 18 items
BANDAGE ADHESIVE 1X3 (GAUZE/BANDAGES/DRESSINGS) IMPLANT
CHLORAPREP W/TINT 26ML (MISCELLANEOUS) IMPLANT
CLOTH BEACON ORANGE TIMEOUT ST (SAFETY) ×2 IMPLANT
GLOVE BIO SURGEON STRL SZ 6.5 (GLOVE) ×1 IMPLANT
GLOVE BIOGEL PI IND STRL 6.5 (GLOVE) ×1 IMPLANT
GLOVE BIOGEL PI IND STRL 8 (GLOVE) ×1 IMPLANT
GLOVE BIOGEL PI INDICATOR 6.5 (GLOVE)
GLOVE BIOGEL PI INDICATOR 8 (GLOVE)
GLOVE ECLIPSE 6.5 STRL STRAW (GLOVE) ×1 IMPLANT
GLOVE SURG SS PI 8.0 STRL IVOR (GLOVE) ×2 IMPLANT
IMMOBILIZER KNEE 20 (SOFTGOODS) ×2
IMMOBILIZER KNEE 20 THIGH 36 (SOFTGOODS) IMPLANT
MANIFOLD NEPTUNE II (INSTRUMENTS) ×1 IMPLANT
NDL SAFETY ECLIPSE 18X1.5 (NEEDLE) IMPLANT
NEEDLE HYPO 18GX1.5 SHARP (NEEDLE)
POSITIONER SURGICAL ARM (MISCELLANEOUS) ×4 IMPLANT
SPONGE GAUZE 4X4 12PLY (GAUZE/BANDAGES/DRESSINGS) IMPLANT
SYR CONTROL 10ML LL (SYRINGE) IMPLANT

## 2011-03-25 NOTE — Op Note (Signed)
Preop: dislocated left hip  Postop: Same  Procedure: reduction of left hip  Beane  Dictation [id number]

## 2011-03-25 NOTE — ED Notes (Signed)
Pt was using toilet when tried to get up and fell back down onto toilet.  Pt has shortening and rotation of left leg.  Both hips have been replaced in past.  Has had multiple problems with right leg.

## 2011-03-25 NOTE — H&P (Signed)
Tom Ortiz is an 78 y.o. male.   Chief Complaint:l  Left hip pain HPI: dislocated left hip.  Past Medical History  Diagnosis Date  . Hypertension     Past Surgical History  Procedure Date  . Joint replacement     Bilateral hip    No family history on file. Social History:  does not have a smoking history on file. He does not have any smokeless tobacco history on file. His alcohol and drug histories not on file.  Allergies: No Known Allergies  Medications Prior to Admission  Medication Dose Route Frequency Provider Last Rate Last Dose  . 0.9 %  sodium chloride infusion   Intravenous Once Peter A. Patrica Duel, MD 75 mL/hr at 03/25/11 2001    . etomidate (AMIDATE) injection 20 mg  20 mg Intravenous Once Peter A. Patrica Duel, MD   12 mg at 03/25/11 2047  . fentaNYL (SUBLIMAZE) injection 50 mcg  50 mcg Intravenous Once Peter A. Patrica Duel, MD   50 mcg at 03/25/11 2031  . morphine 4 MG/ML injection 4 mg  4 mg Intravenous STAT Peter A. Patrica Duel, MD   4 mg at 03/25/11 2001  . ondansetron (ZOFRAN) injection 4 mg  4 mg Intravenous Once Peter A. Patrica Duel, MD   4 mg at 03/25/11 2000   No current outpatient prescriptions on file as of 03/25/2011.    Results for orders placed during the hospital encounter of 03/25/11 (from the past 48 hour(s))  CARDIAC PANEL(CRET KIN+CKTOT+MB+TROPI)     Status: Normal   Collection Time   03/25/11  8:00 PM      Component Value Range Comment   Total CK 41  7 - 232 (U/L)    CK, MB 2.8  0.3 - 4.0 (ng/mL)    Troponin I <0.30  <0.30 (ng/mL)    Relative Index RELATIVE INDEX IS INVALID  0.0 - 2.5    BASIC METABOLIC PANEL     Status: Abnormal   Collection Time   03/25/11  8:00 PM      Component Value Range Comment   Sodium 135  135 - 145 (mEq/L)    Potassium 4.5  3.5 - 5.1 (mEq/L)    Chloride 99  96 - 112 (mEq/L)    CO2 26  19 - 32 (mEq/L)    Glucose, Bld 95  70 - 99 (mg/dL)    BUN 15  6 - 23 (mg/dL)    Creatinine, Ser 1.61  0.50 - 1.35 (mg/dL)    Calcium 8.9  8.4 - 10.5  (mg/dL)    GFR calc non Af Amer 68 (*) >90 (mL/min)    GFR calc Af Amer 78 (*) >90 (mL/min)   CBC     Status: Abnormal   Collection Time   03/25/11  8:00 PM      Component Value Range Comment   WBC 6.8  4.0 - 10.5 (K/uL)    RBC 4.07 (*) 4.22 - 5.81 (MIL/uL)    Hemoglobin 12.4 (*) 13.0 - 17.0 (g/dL)    HCT 09.6 (*) 04.5 - 52.0 (%)    MCV 94.6  78.0 - 100.0 (fL)    MCH 30.5  26.0 - 34.0 (pg)    MCHC 32.2  30.0 - 36.0 (g/dL)    RDW 40.9  81.1 - 91.4 (%)    Platelets 233  150 - 400 (K/uL)   APTT     Status: Normal   Collection Time   03/25/11  8:00 PM  Component Value Range Comment   aPTT 36  24 - 37 (seconds)   PROTIME-INR     Status: Normal   Collection Time   03/25/11  8:00 PM      Component Value Range Comment   Prothrombin Time 14.0  11.6 - 15.2 (seconds)    INR 1.06  0.00 - 1.49    SAMPLE TO BLOOD BANK     Status: Normal   Collection Time   03/25/11  8:00 PM      Component Value Range Comment   Blood Bank Specimen SAMPLE AVAILABLE FOR TESTING      Sample Expiration 03/28/2011      Dg Chest 2 View  03/25/2011  *RADIOLOGY REPORT*  Clinical Data: Fall.  CHEST - 2 VIEW  Comparison: Chest x-ray from 02/08/2008  Findings: AP and lateral views of the chest shows low volumes. The lungs are clear without focal infiltrate, edema, pneumothorax or pleural effusion. Interstitial markings are diffusely coarsened with chronic features.  There is bibasilar atelectasis.  Low volume film accentuates the cardiopericardial silhouette. Bones are diffusely demineralized.  Compression deformity of 2 thoracic vertebral bodies is evident.  IMPRESSION: Underlying chronic interstitial changes.  No acute cardiopulmonary process.  Compression deformity to the thoracic vertebral bodies as seen on the T-spine study from 03/12/2011.  Original Report Authenticated By: ERIC A. MANSELL, M.D.   Dg Hip Complete Left  03/25/2011  *RADIOLOGY REPORT*  Clinical Data: Fall with left hip pain.  LEFT HIP - COMPLETE 2+ VIEW   Comparison: 02/08/2008  Findings: Frontal pelvis shows diffuse osteopenia.  The patient has bilateral hip replacements.  The femoral component of the left hip prosthesis is dislocated.  On the frontal projection, the femoral head component projects superior to the acetabular cup.  Cross- table lateral view again shows superior dislocation of the femoral component.  IMPRESSION: Dislocation of the femoral component of the left hip replacement.  Original Report Authenticated By: ERIC A. MANSELL, M.D.    Review of Systems  Constitutional: Negative.   HENT: Negative.   Eyes: Negative.   Respiratory: Negative.   Cardiovascular: Negative.   Gastrointestinal: Negative.   Genitourinary: Negative.   Musculoskeletal: Positive for joint pain.  Skin: Negative.   Neurological: Negative.   Endo/Heme/Allergies: Negative.   Psychiatric/Behavioral: Negative.     Blood pressure 166/93, pulse 88, temperature 97.9 F (36.6 C), temperature source Oral, resp. rate 18, SpO2 99.00%. Physical Exam  Constitutional: He appears well-developed.  HENT:  Head: Normocephalic.  Eyes: Pupils are equal, round, and reactive to light.  Neck: Normal range of motion.  Cardiovascular: Normal rate.   Respiratory: Effort normal.  GI: Soft.  Musculoskeletal: He exhibits tenderness.       Left hip short and externally rotated. NVI  Neurological: He is alert.  Skin: Skin is warm.  Psychiatric: He has a normal mood and affect.     Assessment/Plan Left hip dislocated. Plan relocation. BEANE,JEFFREY C 03/25/2011, 9:03 PM

## 2011-03-25 NOTE — ED Notes (Signed)
Attempt made for closed reduction in ED with etomidate.  Pt was arrousable with pain during procedure.  [place] used at 3 liters for supplemental oxygen.  Unsuccessful with multiple attempts.

## 2011-03-25 NOTE — Anesthesia Postprocedure Evaluation (Signed)
  Anesthesia Post-op Note  Patient: Tom Ortiz  Procedure(s) Performed:  CLOSED MANIPULATION HIP  Patient Location: PACU  Anesthesia Type: General  Level of Consciousness: awake, alert , oriented and patient cooperative  Airway and Oxygen Therapy: Patient Spontanous Breathing and Patient connected to nasal cannula oxygen  Post-op Pain: none  Post-op Assessment: Post-op Vital signs reviewed, Patient's Cardiovascular Status Stable, Respiratory Function Stable, Patent Airway, No signs of Nausea or vomiting, Adequate PO intake and Pain level controlled  Post-op Vital Signs: Reviewed and stable  Complications: No apparent anesthesia complications

## 2011-03-25 NOTE — Anesthesia Preprocedure Evaluation (Signed)
Anesthesia Evaluation  Patient identified by MRN, date of birth, ID band Patient awake  General Assessment Comment:Light lunch 13:30 Breakfast 11:30 Advanced years  Reviewed: Allergy & Precautions, H&P , NPO status , Patient's Chart, lab work & pertinent test results, reviewed documented beta blocker date and time   Airway Mallampati: II TM Distance: >3 FB Neck ROM: Full    Dental  (+) Teeth Intact   Pulmonary neg pulmonary ROS,  clear to auscultation        Cardiovascular hypertension, Pt. on medications Regular Normal Denies cardiac symptoms   Neuro/Psych Negative Neurological ROS  Negative Psych ROS   GI/Hepatic negative GI ROS, Neg liver ROS,   Endo/Other  Negative Endocrine ROS  Renal/GU negative Renal ROS  Genitourinary negative   Musculoskeletal  (+) Arthritis -, Rheumatoid disorders,    Abdominal   Peds negative pediatric ROS (+)  Hematology negative hematology ROS (+)   Anesthesia Other Findings   Reproductive/Obstetrics negative OB ROS                           Anesthesia Physical Anesthesia Plan  ASA: II  Anesthesia Plan: General   Post-op Pain Management:    Induction: Rapid sequence, Intravenous and Cricoid pressure planned  Airway Management Planned: Oral ETT  Additional Equipment:   Intra-op Plan:   Post-operative Plan: Extubation in OR  Informed Consent: I have reviewed the patients History and Physical, chart, labs and discussed the procedure including the risks, benefits and alternatives for the proposed anesthesia with the patient or authorized representative who has indicated his/her understanding and acceptance.     Plan Discussed with: CRNA and Surgeon  Anesthesia Plan Comments: (Pt refuses regional)        Anesthesia Quick Evaluation

## 2011-03-25 NOTE — Transfer of Care (Signed)
Immediate Anesthesia Transfer of Care Note  Patient: Tom Ortiz  Procedure(s) Performed:  CLOSED MANIPULATION HIP  Patient Location: PACU  Anesthesia Type: General  Level of Consciousness: sedated, patient cooperative and responds to stimulaton  Airway & Oxygen Therapy: Patient Spontanous Breathing and Patient connected to face mask oxgen  Post-op Assessment: Report given to PACU RN and Post -op Vital signs reviewed and stable  Post vital signs: Reviewed and stable  Complications: No apparent anesthesia complications

## 2011-03-25 NOTE — Preoperative (Signed)
Beta Blockers   Reason not to administer Beta Blockers:Not Applicable, pt not on home BB per pt and wife

## 2011-03-25 NOTE — ED Provider Notes (Signed)
History     CSN: [id number]  Arrival date & time 03/25/11  1912   First MD Initiated Contact with Patient 03/25/11 1914      No chief complaint on file. CC:  Fall with hip pain   Pleasant, 78 year old gentleman, who states he was outside in his back yard working and his friends. He had finished his work and had turned to walk back into the home. He states he slipped and fell down a hill landing on his left hip. He was unable to ambulate afterwards and complains of severe pain in his left hip. He does have previous hip or placement on the right that was done by Dr. Lajoyce Corners.   Patient denies any injury to his head or neck. He denies any preceding symptoms. Denies any numbness, weakness or tingling. Patient was brought in by ambulance and was given a total of 150 mcg of fentanyl with good relief of pain.  (Consider location/radiation/quality/duration/timing/severity/associated sxs/prior treatment) HPI  No past medical history on file.  No past surgical history on file.  No family history on file.  History  Substance Use Topics  . Smoking status: Not on file  . Smokeless tobacco: Not on file  . Alcohol Use: Not on file      Review of Systems  All other systems reviewed and are negative.    Allergies  Review of patient's allergies indicates not on file.  Home Medications  No current outpatient prescriptions on file.  There were no vitals taken for this visit.  Physical Exam  Nursing note and vitals reviewed. Constitutional: He is oriented to person, place, and time. He appears well-developed and well-nourished.  HENT:  Head: Normocephalic and atraumatic.  Eyes: Conjunctivae and EOM are normal. Pupils are equal, round, and reactive to light.  Neck: Neck supple.  Cardiovascular: Normal rate and regular rhythm.  Exam reveals no gallop and no friction rub.   No murmur heard. Pulmonary/Chest: Breath sounds normal. He has no wheezes. He has no rales. He exhibits no  tenderness.  Abdominal: Soft. Bowel sounds are normal. He exhibits no distension. There is no tenderness. There is no rebound and no guarding.  Musculoskeletal: Normal range of motion. He exhibits edema and tenderness.       Left hip is shortened and somewhat externally rotated. Skin is closed. Peripheral pulses are normal. Diffuse tenderness to palpation at the approximate left lateral hip.  Neurological: He is alert and oriented to person, place, and time. No cranial nerve deficit. Coordination normal.  Skin: Skin is warm and dry. No rash noted.  Psychiatric: He has a normal mood and affect.    ED Course  Procedures (including critical care time)  Labs Reviewed - No data to display No results found.   No diagnosis found.    MDM  Pt is seen and examined;  Initial history and physical completed.  Will follow.       Dg Chest 2 View  03/25/2011  *RADIOLOGY REPORT*  Clinical Data: Fall.  CHEST - 2 VIEW  Comparison: Chest x-ray from 02/08/2008  Findings: AP and lateral views of the chest shows low volumes. The lungs are clear without focal infiltrate, edema, pneumothorax or pleural effusion. Interstitial markings are diffusely coarsened with chronic features.  There is bibasilar atelectasis.  Low volume film accentuates the cardiopericardial silhouette. Bones are diffusely demineralized.  Compression deformity of 2 thoracic vertebral bodies is evident.  IMPRESSION: Underlying chronic interstitial changes.  No acute cardiopulmonary process.  Compression deformity to  the thoracic vertebral bodies as seen on the T-spine study from 03/12/2011.  Original Report Authenticated By: ERIC A. MANSELL, M.D.   Dg Hip Complete Left  03/25/2011  *RADIOLOGY REPORT*  Clinical Data: Fall with left hip pain.  LEFT HIP - COMPLETE 2+ VIEW  Comparison: 02/08/2008  Findings: Frontal pelvis shows diffuse osteopenia.  The patient has bilateral hip replacements.  The femoral component of the left hip prosthesis is  dislocated.  On the frontal projection, the femoral head component projects superior to the acetabular cup.  Cross- table lateral view again shows superior dislocation of the femoral component.  IMPRESSION: Dislocation of the femoral component of the left hip replacement.  Original Report Authenticated By: ERIC A. MANSELL, M.D.    X-rays reviewed. Dislocation of the left hip replacement. Will have patient consent for moderate sedation and attempt to relocate his prosthesis.    PROCEDURES:  Conscious sedation with etomidate.   Patient was consented, was performed during all risks, benefits, alternatives. Patient identification, was confirmed. Crash cart, suction cardiac continuous cardiac monitoring, and continuous pulse ox was done.  Other procedures:  Closed reduction of left hip prosthesis dislocation.  Unable to relocate   9:36 PM Discussed with Ortho. Will likely need replacement in the OR. Dr. Shelle Iron will be coming in to further evaluate. Patient    9:36 PM  Date: 03/25/2011  Rate: 78  Rhythm: none available  QRS Axis: normal  Intervals: normal  ST/T Wave abnormalities: normal  Conduction Disutrbances:none  Narrative Interpretation:   Old EKG Reviewed: none available    Results for orders placed during the hospital encounter of 03/25/11  CARDIAC PANEL(CRET KIN+CKTOT+MB+TROPI)      Component Value Range   Total CK 41  7 - 232 (U/L)   CK, MB 2.8  0.3 - 4.0 (ng/mL)   Troponin I <0.30  <0.30 (ng/mL)   Relative Index RELATIVE INDEX IS INVALID  0.0 - 2.5   BASIC METABOLIC PANEL      Component Value Range   Sodium 135  135 - 145 (mEq/L)   Potassium 4.5  3.5 - 5.1 (mEq/L)   Chloride 99  96 - 112 (mEq/L)   CO2 26  19 - 32 (mEq/L)   Glucose, Bld 95  70 - 99 (mg/dL)   BUN 15  6 - 23 (mg/dL)   Creatinine, Ser 0.86  0.50 - 1.35 (mg/dL)   Calcium 8.9  8.4 - 57.8 (mg/dL)   GFR calc non Af Amer 68 (*) >90 (mL/min)   GFR calc Af Amer 78 (*) >90 (mL/min)  CBC      Component  Value Range   WBC 6.8  4.0 - 10.5 (K/uL)   RBC 4.07 (*) 4.22 - 5.81 (MIL/uL)   Hemoglobin 12.4 (*) 13.0 - 17.0 (g/dL)   HCT 46.9 (*) 62.9 - 52.0 (%)   MCV 94.6  78.0 - 100.0 (fL)   MCH 30.5  26.0 - 34.0 (pg)   MCHC 32.2  30.0 - 36.0 (g/dL)   RDW 52.8  41.3 - 24.4 (%)   Platelets 233  150 - 400 (K/uL)  APTT      Component Value Range   aPTT 36  24 - 37 (seconds)  PROTIME-INR      Component Value Range   Prothrombin Time 14.0  11.6 - 15.2 (seconds)   INR 1.06  0.00 - 1.49   SAMPLE TO BLOOD BANK      Component Value Range   Blood Bank Specimen SAMPLE AVAILABLE FOR  TESTING     Sample Expiration 03/28/2011     Dg Chest 2 View  03/25/2011  *RADIOLOGY REPORT*  Clinical Data: Fall.  CHEST - 2 VIEW  Comparison: Chest x-ray from 02/08/2008  Findings: AP and lateral views of the chest shows low volumes. The lungs are clear without focal infiltrate, edema, pneumothorax or pleural effusion. Interstitial markings are diffusely coarsened with chronic features.  There is bibasilar atelectasis.  Low volume film accentuates the cardiopericardial silhouette. Bones are diffusely demineralized.  Compression deformity of 2 thoracic vertebral bodies is evident.  IMPRESSION: Underlying chronic interstitial changes.  No acute cardiopulmonary process.  Compression deformity to the thoracic vertebral bodies as seen on the T-spine study from 03/12/2011.  Original Report Authenticated By: ERIC A. MANSELL, M.D.   Dg Hip Complete Left  03/25/2011  *RADIOLOGY REPORT*  Clinical Data: Fall with left hip pain.  LEFT HIP - COMPLETE 2+ VIEW  Comparison: 02/08/2008  Findings: Frontal pelvis shows diffuse osteopenia.  The patient has bilateral hip replacements.  The femoral component of the left hip prosthesis is dislocated.  On the frontal projection, the femoral head component projects superior to the acetabular cup.  Cross- table lateral view again shows superior dislocation of the femoral component.  IMPRESSION: Dislocation of  the femoral component of the left hip replacement.  Original Report Authenticated By: ERIC A. MANSELL, M.D.          Peter A. Patrica Duel, MD 03/25/11 2138

## 2011-03-25 NOTE — ED Notes (Signed)
ZOX:[id number]<AV> Expected date:03/25/11<BR> Expected time: 6:52 PM<BR> Means of arrival:Ambulance<BR> Comments:<BR> EMS 41 GC, 84 yom L hip pain w shortening and rotation

## 2011-03-26 ENCOUNTER — Encounter (HOSPITAL_COMMUNITY): Payer: Self-pay | Admitting: *Deleted

## 2011-03-26 MED ORDER — TRAMADOL HCL 50 MG PO TABS: 50.0000 mg | ORAL_TABLET | Freq: Four times a day (QID) | ORAL | Status: DC | PRN

## 2011-03-26 MED ORDER — DIAZEPAM 5 MG PO TABS
5.0000 mg | ORAL_TABLET | Freq: Two times a day (BID) | ORAL | Status: DC
  Administered 2011-03-26: 5 mg via ORAL
  Filled 2011-03-26: qty 1

## 2011-03-26 MED ORDER — LOSARTAN POTASSIUM-HCTZ 50-12.5 MG PO TABS: 1.0000 | ORAL_TABLET | Freq: Every day | ORAL | Status: DC

## 2011-03-26 MED ORDER — PANTOPRAZOLE SODIUM 40 MG PO TBEC
40.0000 mg | DELAYED_RELEASE_TABLET | Freq: Every day | ORAL | Status: DC
  Administered 2011-03-26: 40 mg via ORAL
  Filled 2011-03-26: qty 1

## 2011-03-26 MED ORDER — PROMETHAZINE HCL 25 MG/ML IJ SOLN: 6.2500 mg | INTRAMUSCULAR | Status: DC | PRN

## 2011-03-26 MED ORDER — ASPIRIN 325 MG PO TABS: 325.0000 mg | ORAL_TABLET | Freq: Every day | ORAL | Status: DC

## 2011-03-26 MED ORDER — HYDROCHLOROTHIAZIDE 12.5 MG PO CAPS
12.5000 mg | ORAL_CAPSULE | Freq: Every day | ORAL | Status: DC
  Administered 2011-03-26: 12.5 mg via ORAL
  Filled 2011-03-26: qty 1

## 2011-03-26 MED ORDER — BUPROPION HCL ER (XL) 150 MG PO TB24
150.0000 mg | ORAL_TABLET | Freq: Two times a day (BID) | ORAL | Status: DC
Start: 2011-03-26 — End: 2011-03-26
  Administered 2011-03-26: 150 mg via ORAL
  Filled 2011-03-26 (×2): qty 1

## 2011-03-26 MED ORDER — HYDROMORPHONE HCL PF 1 MG/ML IJ SOLN: 0.2500 mg | INTRAMUSCULAR | Status: DC | PRN

## 2011-03-26 MED ORDER — PREDNISONE (PAK) 5 MG PO TABS: 5.0000 mg | ORAL_TABLET | Freq: Once | ORAL | Status: DC

## 2011-03-26 MED ORDER — PREDNISONE 5 MG PO TABS
5.0000 mg | ORAL_TABLET | Freq: Once | ORAL | Status: AC
  Administered 2011-03-26: 5 mg via ORAL
  Filled 2011-03-26: qty 1

## 2011-03-26 MED ORDER — LOSARTAN POTASSIUM 50 MG PO TABS
50.0000 mg | ORAL_TABLET | Freq: Every day | ORAL | Status: DC
  Administered 2011-03-26: 50 mg via ORAL
  Filled 2011-03-26: qty 1

## 2011-03-26 NOTE — Anesthesia Postprocedure Evaluation (Signed)
  Anesthesia Post-op Note  Patient: Tom Ortiz  Procedure(s) Performed:  CLOSED MANIPULATION HIP  Patient Location: PACU  Anesthesia Type: General  Level of Consciousness: oriented and sedated  Airway and Oxygen Therapy: Patient Spontanous Breathing and Patient connected to nasal cannula oxygen  Post-op Pain: mild  Post-op Assessment: Post-op Vital signs reviewed, Patient's Cardiovascular Status Stable, Respiratory Function Stable and Patent Airway  Post-op Vital Signs: stable  Complications: No apparent anesthesia complications

## 2011-03-26 NOTE — Progress Notes (Signed)
Physical Therapy Treatment Patient Details Name: Tom Ortiz MRN: [id number] DOB: 10/17/1927 Today's Date: 03/26/2011 [id number] 2 gt PT Assessment/Plan  PT - Assessment/Plan Comments on Treatment Session: reviewd THP with pt and wife; BP 158/90 pt slightly dizzy; RN notified PT Plan: Discharge plan remains appropriate PT Frequency: Min 6X/week Follow Up Recommendations: Home health PT Equipment Recommended: None recommended by PT PT Goals  Acute Rehab PT Goals PT Goal Formulation: With patient Time For Goal Achievement: 5 days Pt will go Supine/Side to Sit: with supervision PT Goal: Supine/Side to Sit - Progress: Progressing toward goal Pt will go Sit to Supine/Side: with supervision PT Goal: Sit to Supine/Side - Progress: Not met Pt will go Sit to Stand: with supervision PT Goal: Sit to Stand - Progress: Partly met Pt will go Stand to Sit: with supervision PT Goal: Stand to Sit - Progress: Partly met Pt will Ambulate: 51 - 150 feet;with supervision;with rolling walker PT Goal: Ambulate - Progress: Partly met Pt will Go Up / Down Stairs: 3-5 stairs;with min assist;with least restrictive assistive device;with rail(s) PT Goal: Up/Down Stairs - Progress: Met  PT Treatment Precautions/Restrictions  Precautions Precautions: Posterior Hip Precaution Booklet Issued:  (handout given) Precaution Comments: Educated pt on hip precautions Restrictions Weight Bearing Restrictions: Yes LLE Weight Bearing: Weight bearing as tolerated Mobility (including Balance) Bed Mobility Supine to Sit: 4: Min assist;3: Mod assist Supine to Sit Details (indicate cue type and reason): cues for THP Sitting - Scoot to Edge of Bed: 4: Min assist Transfers Sit to Stand: 4: Min assist;From chair/3-in-1;With upper extremity assist Sit to Stand Details (indicate cue type and reason): cues for hands and wt shift Stand to Sit: 4: Min assist;To chair/3-in-1 Stand to Sit Details: cues for THP and  hands Ambulation/Gait Ambulation/Gait Assistance: 4: Min assist Ambulation/Gait Assistance Details (indicate cue type and reason): cues for posture, RW distance from self Ambulation Distance (Feet): 50 Feet Assistive device: Rolling walker Gait Pattern: Step-to pattern Stairs: Yes Stairs Assistance: 4: Min assist Stairs Assistance Details (indicate cue type and reason): cues for sequence and THP Stair Management Technique: Two rails;Forwards;Step to pattern Number of Stairs: 4     Exercise    End of Session PT - End of Session Equipment Utilized During Treatment: Left knee immobilizer Activity Tolerance: Patient tolerated treatment well Patient left: in chair;with call bell in reach;with family/visitor present Nurse Communication:  (D/C needs) General Behavior During Session: [facility] for tasks performed Cognition: [facility] for tasks performed  [facility] 03/26/2011, 2:21 PM

## 2011-03-26 NOTE — Progress Notes (Signed)
Physical Therapy Evaluation Patient Details Name: Tom Ortiz MRN: [id number] DOB: 07-27-1927 Today's Date: 03/26/2011 [id number] Ev 2  Problem List: There is no problem list on file for this patient.   Past Medical History:  Past Medical History  Diagnosis Date  . Hypertension    Past Surgical History:  Past Surgical History  Procedure Date  . Joint replacement     Bilateral hip    PT Assessment/Plan/Recommendation PT Assessment Clinical Impression Statement: pt will benefit from PT to maximize independence PT Recommendation/Assessment: Patient will need skilled PT in the acute care venue PT Problem List: Decreased activity tolerance;Decreased mobility;Decreased knowledge of precautions;Decreased knowledge of use of DME PT Therapy Diagnosis : Difficulty walking PT Plan PT Frequency: Min 6X/week PT Treatment/Interventions: DME instruction;Gait training;Stair training;Functional mobility training;Therapeutic activities;Patient/family education PT Recommendation Follow Up Recommendations: Home health PT PT Goals  Acute Rehab PT Goals PT Goal Formulation: With patient Time For Goal Achievement: 5 days Pt will go Supine/Side to Sit: with supervision PT Goal: Supine/Side to Sit - Progress: Progressing toward goal Pt will go Sit to Supine/Side: with supervision PT Goal: Sit to Supine/Side - Progress: Not met Pt will go Sit to Stand: with supervision PT Goal: Sit to Stand - Progress: Progressing toward goal Pt will go Stand to Sit: with supervision PT Goal: Stand to Sit - Progress: Progressing toward goal Pt will Ambulate: 51 - 150 feet;with supervision;with rolling walker PT Goal: Ambulate - Progress: Progressing toward goal Pt will Go Up / Down Stairs: 3-5 stairs;with min assist;with least restrictive assistive device;with rail(s) PT Goal: Up/Down Stairs - Progress: Not met  PT Evaluation Precautions/Restrictions  Precautions Precautions: Posterior Hip Precaution  Comments: Educated pt on hip precautions Restrictions Weight Bearing Restrictions: Yes LLE Weight Bearing: Weight bearing as tolerated Prior Functioning  Home Living Lives With: Spouse Receives Help From: Family Type of Home: House Home Layout: One level Home Access: Stairs to enter Entrance Stairs-Rails: Can reach both Entrance Stairs-Number of Steps: 3 Home Adaptive Equipment: Walker - rolling;Bedside commode/3-in-1 Additional Comments: Has bedside commode from previous hip sx in 2007 Prior Function Level of Independence: Independent with basic ADLs;Requires assistive device for independence Driving: Yes Vocation: Retired Financial risk analyst Arousal/Alertness: Awake/alert Overall Cognitive Status: Appears within functional limits for tasks assessed Orientation Level: Oriented X4 Sensation/Coordination   Extremity Assessment RUE Assessment RUE Assessment: Exceptions to [facility] RUE Strength RLE Overall Strength Comments: Grossly WFL per functional assessment.  States that a nerve was severed during previous sx on R hip.  LLE Assessment LLE Assessment:  (grossly WFL for activities tested) Mobility (including Balance) Bed Mobility Supine to Sit: 4: Min assist;3: Mod assist Supine to Sit Details (indicate cue type and reason): cues for THP Sitting - Scoot to Edge of Bed: 4: Min assist Transfers Sit to Stand: 4: Min assist;From elevated surface;With upper extremity assist Sit to Stand Details (indicate cue type and reason): cues for hands and wt shift Stand to Sit: 4: Min assist;To chair/3-in-1 Stand to Sit Details: cues for THP and hands Ambulation/Gait Ambulation/Gait Assistance: 4: Min assist Ambulation/Gait Assistance Details (indicate cue type and reason): cues for posture, RW distance from self Ambulation Distance (Feet): 150 Feet Assistive device: Rolling walker Gait Pattern: Step-to pattern    Exercise    End of Session PT - End of Session Equipment Utilized  During Treatment: Left knee immobilizer Activity Tolerance: Patient tolerated treatment well Patient left: in chair General Behavior During Session: [facility] for tasks performed Cognition: [facility] for tasks performed  [facility]  03/26/2011, 11:06 AM

## 2011-03-26 NOTE — Progress Notes (Signed)
Physician Discharge Summary  Patient ID: Tom Ortiz MRN: [id number] DOB/AGE: Mar 27, 1927 78 y.o.  Admit date: 03/25/2011 Discharge date: 03/26/2011  Admission Diagnoses: HTN S/p THA dislocation  Discharge Diagnoses:  HTN S/p THA dislocation with closed reduction   Discharged Condition: good  Hospital Course: uneventful  Basename 03/25/11 2000  WBC 6.8  HCT 38.5*  PLT 233    Basename 03/25/11 2000  NA 135  K 4.5  CL 99  CO2 26  GLUCOSE 95  BUN 15  CREATININE 1.01  CALCIUM 8.9    Procedure Note: Preop: dislocated left hip  Postop: Same  Procedure: reduction of left hip  [place]  Dictation [id number]   Consults: PT   Disposition:   Discharge Orders    Future Orders Please Complete By Expires   Diet - low sodium heart healthy      Call MD / Call 911      Comments:   If you experience chest pain or shortness of breath, CALL 911 and be transported to the hospital emergency room.  If you develope a fever above 101 F, pus (white drainage) or increased drainage or redness at the wound, or calf pain, call your surgeon's office.   Constipation Prevention      Comments:   Drink plenty of fluids.  Prune juice may be helpful.  You may use a stool softener, such as Colace (over the counter) 100 mg twice a day.  Use MiraLax (over the counter) for constipation as needed.   Increase activity slowly as tolerated      Weight Bearing as taught in Physical Therapy      Comments:   Use a walker or crutches as instructed.   Discharge instructions      Comments:   Use knee immobilizer until follow up with Aluisio  Hip precautions.  Total Hip Protocol.      Current Discharge Medication List    START taking these medications   Details  aspirin 325 MG tablet Take 1 tablet (325 mg total) by mouth daily. Qty: 30 tablet, Refills: 0      CONTINUE these medications which have NOT CHANGED   Details  buPROPion (WELLBUTRIN XL) 150 MG 24 hr tablet Take 150 mg by mouth 2  (two) times daily.    cyclobenzaprine (FLEXERIL) 10 MG tablet Take 5-10 mg by mouth 3 (three) times daily as needed. Muscle spam    diazepam (VALIUM) 5 MG tablet Take 5 mg by mouth 2 (two) times daily.    losartan-hydrochlorothiazide (HYZAAR) 50-12.5 MG per tablet Take 1 tablet by mouth daily.    omeprazole (PRILOSEC) 20 MG capsule Take 20 mg by mouth daily.    predniSONE (STERAPRED UNI-PAK) 5 MG TABS Take 5 mg by mouth.    traMADol (ULTRAM) 50 MG tablet Take 50 mg by mouth every 6 (six) hours as needed.       Follow-up Information    Follow up with ALUISIO,FRANK V in 2 weeks.   Contact information:   [facility] [address] [place] [address] [phone]          Signed: Liam Graham. 03/26/2011, 9:44 AM

## 2011-03-26 NOTE — Progress Notes (Signed)
HOME HEALTH AGENCIES SERVING [place]   Agencies that are Medicare-Certified and are affiliated with [facility]  Telephone Number Address  [facility]   The [facility] has ownership interest in this company; however, you are under no obligation to use this agency. [phone] or  [phone] [address] [place], [place] [address]   Agencies that are Medicare-Certified and are not affiliated with [facility]                                                                                 Home Health Agency Telephone Number Address  [facility] [phone] Fax [phone] [address], [address] [place], [place]  [address]  [facility] [phone] or [phone] Fax [phone] [address] [place], [place] [address]  [facility] [phone] Fax [phone] [address] [place], [place] [address]  [facility] [phone] Fax [phone] [address], [address] [place], [place]  [address]  [facility] [phone] Fax [phone] [address], [address] [place], [place] [address]  [facility] [phone] [address] [place], [place] [address]  [facility] [phone]  [address] [place], [place] [address]  [facility] [phone] or [phone] Fax [phone] [address] [place], [place]  [address]  [facility] [phone] Fax [phone] [address] [place], [place]  [address]  [facility]  [phone] Fax [phone] [address] [place], [place] [address]               Agencies that are not Medicare-Certified and are not affiliated with [facility]   Home Health Agency Telephone Number Address  [facility] [phone] or [phone] Fax [phone] [address] [place], [place]  [address]  [facility] [phone] Fax [phone] [address] [place], [place]  [address]  [facility]  [phone] Fax [phone] [address] [place], [place] [address]  [facility] [phone] Fax [phone] [address] [place], [place] [address]  [facility] [phone] or [phone] Fax [phone] [address] [place], [place]  [address]  [facility] [phone] or [phone] Fax [phone] [address] [place], [place]  [address]  [facility] [phone] Fax [phone] [address] [place], [place]  [address]  [facility] [phone] [address] [place], [place]  [address]  [facility] [phone] Fax [phone] [address] [place], [place]  [address]  [facility] [phone] Fax [phone] [address] [place], [place]  [address]  [facility] [phone] Fax [phone] [address] [place], [place] [address]  [facility] [phone] Fax [phone] [address] [place],   [address]   

## 2011-03-26 NOTE — Progress Notes (Signed)
Pt and spouse verbalized understanding of discharge instructions. Pt assessment has not changed from am. Pt was given d/c forms and PT and case manager has seen pt.

## 2011-03-26 NOTE — Op Note (Signed)
NAMESAVIEN, MAMULA NO.:  [id number]  MEDICAL RECORD NO.:  [id number]  LOCATION:  [id number]                         FACILITY:  [facility]  PHYSICIAN:  Jene Every, M.D.    DATE OF BIRTH:  [birth year]  DATE OF PROCEDURE:  03/25/2011 DATE OF DISCHARGE:                              OPERATIVE REPORT   PREOPERATIVE DIAGNOSIS:  Dislocated left hip arthroplasty.  POSTOPERATIVE DIAGNOSIS:  Dislocated left hip arthroplasty.  PROCEDURE PERFORMED:  Closed reduction under anesthesia, left hip, stress radiographs.  ANESTHESIA:  General.  ASSISTANT:  None.  HISTORY:  78 year old status post total hip dislocated coming off the toilet today indicated for closed reduction.  Risks and benefits discussed of bleeding, including inability to reduce the dislocation, etc.  TECHNIQUE:  With the patient in supine position, after induction of adequate general anesthesia, I performed a gentle reduction maneuver on the left hip, which was externally rotated and shortened.  With gentle longitudinal traction and flexion and internal rotation, a gentle reduction was appreciated.  His leg-lengths were restored.  He had good pulses.  In the AP and frog leg lateral, there was concentric reduction of the components with no evidence of fracture or dissociation of the acetabular component or the femur.  An immobilizer was placed.  He was then extubated without difficulty and transported to recovery in satisfactory condition.  The patient tolerated the procedure.  No complications.     Jene Every, M.D.     Cordelia Pen  D:  03/25/2011  T:  03/26/2011  Job:  [id number]

## 2011-03-26 NOTE — Progress Notes (Signed)
Patient:  Tom Ortiz, Tom Ortiz   Account Number:  [id number]  Date Initiated:  03/26/2011  Documentation initiated by:  CRAFT,TERRI  Subjective/Objective Assessment:   78 yo male admitted 03/25/11 with hip pain  S/P hip reduction     Action/Plan:   D/C when medically stable   Anticipated DC Date:  03/26/2011   Anticipated DC Plan:  HOME W HOME HEALTH SERVICES      DC Planning Services  CM consult      [facility] Choice  HOME HEALTH   Choice offered to / List presented to:  C-1 Patient        HH arranged  HH-2 PT      [facility] agency  [facility]   Status of service:  Completed, signed off    Discharge Disposition:  HOME W HOME HEALTH SERVICES    Comments:  03/26/11, Kathi Der RNC-MNN, BSN, [phone], CM received referral.  CM spoke to pt and offered choice for [facility] services.  Pt has chosen [place] for [employer].  Debbie at [place] contacted and verbal confirmation of services received.

## 2011-03-27 NOTE — Discharge Summary (Signed)
Patient ID:  Tom Ortiz  MRN: [id number]  DOB/AGE: Jan 28, 1928 78 y.o.  Admit date: 03/25/2011  Discharge date: 03/26/2011  Admission Diagnoses:  HTN  S/p THA dislocation  Discharge Diagnoses:  HTN  S/p THA dislocation with closed reduction  Discharged Condition: good  Hospital Course: uneventful   Basename  03/25/11 2000   WBC  6.8   HCT  38.5*   PLT  233     Basename  03/25/11 2000   NA  135   K  4.5   CL  99   CO2  26   GLUCOSE  95   BUN  15   CREATININE  1.01   CALCIUM  8.9    Procedure Note:  Preop: dislocated left hip  Postop: Same  Procedure: reduction of left hip  [place]ane  Dictation [id number]  Consults: PT  Disposition:  Discharge Orders    Future Orders  Please Complete By  Expires    Diet - low sodium heart healthy      Call MD / Call 911      Comments:    If you experience chest pain or shortness of breath, CALL 911 and be transported to the hospital emergency room. If you develope a fever above 101 F, pus (white drainage) or increased drainage or redness at the wound, or calf pain, call your surgeon's office.    Constipation Prevention      Comments:    Drink plenty of fluids. Prune juice may be helpful. You may use a stool softener, such as Colace (over the counter) 100 mg twice a day. Use MiraLax (over the counter) for constipation as needed.    Increase activity slowly as tolerated      Weight Bearing as taught in Physical Therapy      Comments:    Use a walker or crutches as instructed.    Discharge instructions      Comments:    Use knee immobilizer until follow up with Aluisio  Hip precautions.  Total Hip Protocol.      Current Discharge Medication List     START taking these medications    Details   aspirin 325 MG tablet  Take 1 tablet (325 mg total) by mouth daily.  Qty: 30 tablet, Refills: 0       CONTINUE these medications which have NOT CHANGED    Details   buPROPion (WELLBUTRIN XL) 150 MG 24 hr tablet  Take 150 mg by mouth  2 (two) times daily.     cyclobenzaprine (FLEXERIL) 10 MG tablet  Take 5-10 mg by mouth 3 (three) times daily as needed. Muscle spam     diazepam (VALIUM) 5 MG tablet  Take 5 mg by mouth 2 (two) times daily.     losartan-hydrochlorothiazide (HYZAAR) 50-12.5 MG per tablet  Take 1 tablet by mouth daily.     omeprazole (PRILOSEC) 20 MG capsule  Take 20 mg by mouth daily.     predniSONE (STERAPRED UNI-PAK) 5 MG TABS  Take 5 mg by mouth.     traMADol (ULTRAM) 50 MG tablet  Take 50 mg by mouth every 6 (six) hours as needed.        Follow-up Information    Follow up with ALUISIO,FRANK V in 2 weeks.    Contact information:    [facility]  [address], [address]  [place] [address]  [phone]

## 2011-04-13 ENCOUNTER — Other Ambulatory Visit: Payer: Self-pay | Admitting: Internal Medicine

## 2011-04-13 DIAGNOSIS — R110 Nausea: Secondary | ICD-10-CM

## 2011-04-17 ENCOUNTER — Ambulatory Visit
Admission: RE | Admit: 2011-04-17 | Discharge: 2011-04-17 | Disposition: A | Discharge status: Home / Self Care | Payer: MEDICARE | Source: Ambulatory Visit | Attending: Internal Medicine | Admitting: Internal Medicine

## 2011-04-17 DIAGNOSIS — R110 Nausea: Secondary | ICD-10-CM

## 2011-06-11 ENCOUNTER — Encounter (HOSPITAL_COMMUNITY)
Admission: AD | Disposition: A | Discharge status: Home / Self Care | Payer: Self-pay | Source: Home / Self Care | Attending: Emergency Medicine

## 2011-06-11 ENCOUNTER — Emergency Department (HOSPITAL_COMMUNITY): Payer: MEDICARE

## 2011-06-11 ENCOUNTER — Observation Stay (HOSPITAL_COMMUNITY)
Admission: AD | Admit: 2011-06-11 | Discharge: 2011-06-12 | Disposition: A | Discharge status: Home / Self Care | Payer: MEDICARE | Attending: Emergency Medicine | Admitting: Emergency Medicine

## 2011-06-11 ENCOUNTER — Encounter (HOSPITAL_COMMUNITY): Payer: Self-pay | Admitting: Anesthesiology

## 2011-06-11 ENCOUNTER — Emergency Department (HOSPITAL_COMMUNITY): Payer: MEDICARE | Admitting: Anesthesiology

## 2011-06-11 ENCOUNTER — Encounter (HOSPITAL_COMMUNITY): Payer: Self-pay | Admitting: Emergency Medicine

## 2011-06-11 ENCOUNTER — Other Ambulatory Visit: Payer: Self-pay

## 2011-06-11 DIAGNOSIS — Z01812 Encounter for preprocedural laboratory examination: Secondary | ICD-10-CM | POA: Insufficient documentation

## 2011-06-11 DIAGNOSIS — Z01810 Encounter for preprocedural cardiovascular examination: Secondary | ICD-10-CM | POA: Insufficient documentation

## 2011-06-11 DIAGNOSIS — Z96649 Presence of unspecified artificial hip joint: Secondary | ICD-10-CM | POA: Insufficient documentation

## 2011-06-11 DIAGNOSIS — T84021A Dislocation of internal left hip prosthesis, initial encounter: Secondary | ICD-10-CM

## 2011-06-11 DIAGNOSIS — X500XXA Overexertion from strenuous movement or load, initial encounter: Secondary | ICD-10-CM | POA: Insufficient documentation

## 2011-06-11 DIAGNOSIS — Z79899 Other long term (current) drug therapy: Secondary | ICD-10-CM | POA: Insufficient documentation

## 2011-06-11 DIAGNOSIS — T84029A Dislocation of unspecified internal joint prosthesis, initial encounter: Principal | ICD-10-CM | POA: Insufficient documentation

## 2011-06-11 DIAGNOSIS — I10 Essential (primary) hypertension: Secondary | ICD-10-CM | POA: Insufficient documentation

## 2011-06-11 HISTORY — PX: HIP CLOSED REDUCTION: SHX983

## 2011-06-11 LAB — CBC
HCT: 40.8 % (ref 39.0–52.0)
Hemoglobin: 13.7 g/dL (ref 13.0–17.0)
MCH: 31.8 pg (ref 26.0–34.0)
MCHC: 33.6 g/dL (ref 30.0–36.0)
MCV: 94.7 fL (ref 78.0–100.0)
Platelets: 252 10*3/uL (ref 150–400)
RBC: 4.31 MIL/uL (ref 4.22–5.81)
RDW: 13.7 % (ref 11.5–15.5)
WBC: 8.4 10*3/uL (ref 4.0–10.5)

## 2011-06-11 LAB — BASIC METABOLIC PANEL
BUN: 17 mg/dL (ref 6–23)
CO2: 26 mEq/L (ref 19–32)
Calcium: 8.9 mg/dL (ref 8.4–10.5)
Chloride: 102 mEq/L (ref 96–112)
Creatinine, Ser: 1.01 mg/dL (ref 0.50–1.35)
GFR calc Af Amer: 78 mL/min — ABNORMAL LOW (ref >90)
GFR calc non Af Amer: 68 mL/min — ABNORMAL LOW (ref >90)
Glucose, Bld: 94 mg/dL (ref 70–99)
Potassium: 3.7 mEq/L (ref 3.5–5.1)
Sodium: 138 mEq/L (ref 135–145)

## 2011-06-11 LAB — DIFFERENTIAL
Basophils Absolute: 0 10*3/uL (ref 0.0–0.1)
Basophils Relative: 0 % (ref 0–1)
Eosinophils Absolute: 0 10*3/uL (ref 0.0–0.7)
Eosinophils Relative: 0 % (ref 0–5)
Lymphocytes Relative: 11 % — ABNORMAL LOW (ref 12–46)
Lymphs Abs: 0.9 10*3/uL (ref 0.7–4.0)
Monocytes Absolute: 0.5 10*3/uL (ref 0.1–1.0)
Monocytes Relative: 6 % (ref 3–12)
Neutro Abs: 7 10*3/uL (ref 1.7–7.7)
Neutrophils Relative %: 83 % — ABNORMAL HIGH (ref 43–77)

## 2011-06-11 SURGERY — CLOSED MANIPULATION, JOINT, HIP
Anesthesia: General | Site: Hip | Laterality: Left | Wound class: Clean

## 2011-06-11 MED ORDER — FENTANYL CITRATE 0.05 MG/ML IJ SOLN
INTRAMUSCULAR | Status: DC | PRN
  Administered 2011-06-11 (×4): 25 ug via INTRAVENOUS

## 2011-06-11 MED ORDER — SODIUM CHLORIDE 0.9 % IV SOLN
INTRAVENOUS | Status: DC
  Administered 2011-06-11: 21:00:00 via INTRAVENOUS

## 2011-06-11 MED ORDER — HYDROMORPHONE HCL PF 1 MG/ML IJ SOLN
1.0000 mg | Freq: Once | INTRAMUSCULAR | Status: AC
  Administered 2011-06-11: 1 mg via INTRAVENOUS
  Filled 2011-06-11: qty 1

## 2011-06-11 MED ORDER — LORAZEPAM 0.5 MG PO TABS
0.5000 mg | ORAL_TABLET | Freq: Every day | ORAL | Status: DC
  Administered 2011-06-11: 0.5 mg via ORAL
  Filled 2011-06-11: qty 1

## 2011-06-11 MED ORDER — ASPIRIN 325 MG PO TABS
325.0000 mg | ORAL_TABLET | Freq: Every day | ORAL | Status: DC
  Administered 2011-06-12: 325 mg via ORAL
  Filled 2011-06-11: qty 1

## 2011-06-11 MED ORDER — LIDOCAINE HCL (CARDIAC) 20 MG/ML IV SOLN
INTRAVENOUS | Status: DC | PRN
  Administered 2011-06-11: 100 mg via INTRAVENOUS

## 2011-06-11 MED ORDER — METOCLOPRAMIDE HCL 10 MG PO TABS: 5.0000 mg | ORAL_TABLET | Freq: Three times a day (TID) | ORAL | Status: DC | PRN

## 2011-06-11 MED ORDER — LACTATED RINGERS IV SOLN
INTRAVENOUS | Status: DC | PRN
  Administered 2011-06-11: 19:00:00 via INTRAVENOUS

## 2011-06-11 MED ORDER — ONDANSETRON HCL 4 MG PO TABS: 4.0000 mg | ORAL_TABLET | Freq: Four times a day (QID) | ORAL | Status: DC | PRN

## 2011-06-11 MED ORDER — FENTANYL CITRATE 0.05 MG/ML IJ SOLN
INTRAMUSCULAR | Status: AC
Start: 2011-06-11 — End: 2011-06-11
  Filled 2011-06-11: qty 2

## 2011-06-11 MED ORDER — METHOCARBAMOL 500 MG PO TABS
500.0000 mg | ORAL_TABLET | Freq: Four times a day (QID) | ORAL | Status: DC | PRN
  Administered 2011-06-12: 500 mg via ORAL
  Filled 2011-06-11: qty 1

## 2011-06-11 MED ORDER — OXYCODONE HCL 5 MG PO TABS: 5.0000 mg | ORAL_TABLET | ORAL | Status: DC | PRN

## 2011-06-11 MED ORDER — ONDANSETRON HCL 4 MG/2ML IJ SOLN: 4.0000 mg | Freq: Four times a day (QID) | INTRAMUSCULAR | Status: DC | PRN

## 2011-06-11 MED ORDER — FENTANYL CITRATE 0.05 MG/ML IJ SOLN
100.0000 ug | Freq: Once | INTRAMUSCULAR | Status: AC
  Administered 2011-06-11: 100 ug via INTRAVENOUS
  Filled 2011-06-11: qty 2

## 2011-06-11 MED ORDER — LOSARTAN POTASSIUM 50 MG PO TABS
50.0000 mg | ORAL_TABLET | Freq: Every day | ORAL | Status: DC
  Administered 2011-06-12: 50 mg via ORAL
  Filled 2011-06-11: qty 1

## 2011-06-11 MED ORDER — OXYCODONE HCL 5 MG PO CAPS: 5.0000 mg | ORAL_CAPSULE | ORAL | Status: DC | PRN

## 2011-06-11 MED ORDER — PROPOFOL 10 MG/ML IV EMUL
INTRAVENOUS | Status: DC | PRN
  Administered 2011-06-11: 200 mg via INTRAVENOUS

## 2011-06-11 MED ORDER — LACTATED RINGERS IV SOLN: INTRAVENOUS | Status: DC

## 2011-06-11 MED ORDER — OXYCODONE-ACETAMINOPHEN 5-325 MG PO TABS: 1.0000 | ORAL_TABLET | ORAL | Status: DC | PRN

## 2011-06-11 MED ORDER — METHOCARBAMOL 100 MG/ML IJ SOLN
500.0000 mg | Freq: Four times a day (QID) | INTRAVENOUS | Status: DC | PRN
  Filled 2011-06-11: qty 5

## 2011-06-11 MED ORDER — HYDROCODONE-ACETAMINOPHEN 5-325 MG PO TABS
1.0000 | ORAL_TABLET | ORAL | Status: DC | PRN
  Administered 2011-06-12: 1 via ORAL
  Filled 2011-06-11: qty 1

## 2011-06-11 MED ORDER — LOSARTAN POTASSIUM-HCTZ 50-12.5 MG PO TABS: 1.0000 | ORAL_TABLET | Freq: Every day | ORAL | Status: DC

## 2011-06-11 MED ORDER — ONDANSETRON HCL 4 MG/2ML IJ SOLN
4.0000 mg | Freq: Once | INTRAMUSCULAR | Status: AC
  Administered 2011-06-11: 4 mg via INTRAVENOUS
  Filled 2011-06-11: qty 2

## 2011-06-11 MED ORDER — MIRTAZAPINE 30 MG PO TABS
30.0000 mg | ORAL_TABLET | Freq: Every day | ORAL | Status: DC
  Administered 2011-06-11: 30 mg via ORAL
  Filled 2011-06-11 (×3): qty 1

## 2011-06-11 MED ORDER — HYDROCHLOROTHIAZIDE 12.5 MG PO CAPS
12.5000 mg | ORAL_CAPSULE | Freq: Every day | ORAL | Status: DC
  Administered 2011-06-12: 12.5 mg via ORAL
  Filled 2011-06-11: qty 1

## 2011-06-11 MED ORDER — METOCLOPRAMIDE HCL 5 MG/ML IJ SOLN
5.0000 mg | Freq: Three times a day (TID) | INTRAMUSCULAR | Status: DC | PRN
Start: 2011-06-11 — End: 2011-06-12

## 2011-06-11 MED ORDER — FENTANYL CITRATE 0.05 MG/ML IJ SOLN: 25.0000 ug | INTRAMUSCULAR | Status: DC | PRN

## 2011-06-11 SURGICAL SUPPLY — 1 items: IMMOBILIZER KNEE 22 UNIV (SOFTGOODS) ×2 IMPLANT

## 2011-06-11 NOTE — Brief Op Note (Signed)
06/11/2011  7:39 PM  PATIENT:  Marda Stalker Sippel  78 y.o. male  PRE-OPERATIVE DIAGNOSIS:  dislocated left total hip  POST-OPERATIVE DIAGNOSIS:  dislocated left hip  PROCEDURE:  Procedure(s) (LRB): CLOSED MANIPULATION HIP (Left)  SURGEON:  Surgeon(s) and Role:    * Drucilla Schmidt, MD - Primary  PHYSICIAN ASSISTANT:   ASSISTANTS: Mr [name] [facility]   ANESTHESIA:   general  EBL:     BLOOD ADMINISTERED:none  DRAINS: none   LOCAL MEDICATIONS USED:  MARCAINE     SPECIMEN:  No Specimen  DISPOSITION OF SPECIMEN:  N/A  COUNTS:  YES  TOURNIQUET:  * No tourniquets in log *  DICTATION: .Other Dictation: Dictation Number [id number]  PLAN OF CARE: Admit for overnight observation  PATIENT DISPOSITION:  PACU - hemodynamically stable.   Delay start of Pharmacological VTE agent (>24hrs) due to surgical blood loss or risk of bleeding: not applicabledislocation

## 2011-06-11 NOTE — Anesthesia Preprocedure Evaluation (Addendum)
Anesthesia Evaluation  Patient identified by MRN, date of birth, ID band Patient awake    Reviewed: Allergy & Precautions, H&P , NPO status , Patient's Chart, lab work & pertinent test results  Airway Mallampati: II TM Distance: >3 FB Neck ROM: Full    Dental No notable dental hx.    Pulmonary neg pulmonary ROS,  breath sounds clear to auscultation  Pulmonary exam normal       Cardiovascular hypertension, Pt. on medications Rhythm:Regular Rate:Normal     Neuro/Psych negative neurological ROS  negative psych ROS   GI/Hepatic Neg liver ROS, GERD-  Medicated and Controlled,  Endo/Other  negative endocrine ROS  Renal/GU negative Renal ROS  negative genitourinary   Musculoskeletal negative musculoskeletal ROS (+)   Abdominal   Peds negative pediatric ROS (+)  Hematology negative hematology ROS (+)   Anesthesia Other Findings   Reproductive/Obstetrics negative OB ROS                         Anesthesia Physical Anesthesia Plan  ASA: II  Anesthesia Plan: General   Post-op Pain Management:    Induction: Intravenous  Airway Management Planned: LMA  Additional Equipment:   Intra-op Plan:   Post-operative Plan: Extubation in OR  Informed Consent: I have reviewed the patients History and Physical, chart, labs and discussed the procedure including the risks, benefits and alternatives for the proposed anesthesia with the patient or authorized representative who has indicated his/her understanding and acceptance.   Dental advisory given  Plan Discussed with: CRNA  Anesthesia Plan Comments: (NPO after 10 AM)       Anesthesia Quick Evaluation

## 2011-06-11 NOTE — Discharge Summary (Signed)
I have personally examined the patient and discussed the treatment plan

## 2011-06-11 NOTE — ED Notes (Signed)
FAO:[id number]<MV> Expected date:06/11/11<BR> Expected time: 1:42 PM<BR> Means of arrival:Ambulance<BR> Comments:<BR> M41. 78 yo f. Trip and fall on concrete, hematoma to head, LSB, NO LOC, CAOX4. 10 mins

## 2011-06-11 NOTE — ED Provider Notes (Signed)
History     CSN: [id number]  Arrival date & time 06/11/11  1424   First MD Initiated Contact with Patient 06/11/11 1410      Chief Complaint  Patient presents with  . left hip injury     (Consider location/radiation/quality/duration/timing/severity/associated sxs/prior treatment) Patient is a 78 y.o. male presenting with hip pain. The history is provided by the patient.  Hip Pain This is a new problem. The current episode started today. The problem occurs constantly. Associated symptoms include arthralgias and joint swelling. Pertinent negatives include no chills, fever, numbness or weakness.  Pt with left hip prosthesis, states was leaning forward when left "pop" in left hip. States now unable to walk or move that hip. States hx of the same, at that time it was dislocated and he was taken to OR. Pt denies falling down or any other injuries. Denies hitting his head. Denies numbness or weakness in left leg or foot.  Past Medical History  Diagnosis Date  . Hypertension     Past Surgical History  Procedure Date  . Joint replacement     Bilateral hip  . Hip closed reduction 03/25/2011    Procedure: CLOSED MANIPULATION HIP;  Surgeon: Javier Docker;  Location: [facility];  Service: Orthopedics;  Laterality: Left;    History reviewed. No pertinent family history.  History  Substance Use Topics  . Smoking status: Never Smoker   . Smokeless tobacco: Never Used  . Alcohol Use: No      Review of Systems  Constitutional: Negative for fever and chills.  HENT: Negative.   Eyes: Negative.   Respiratory: Negative.   Cardiovascular: Negative.   Gastrointestinal: Negative.   Genitourinary: Negative.   Musculoskeletal: Positive for joint swelling and arthralgias.  Skin: Negative.   Neurological: Negative for weakness and numbness.  Psychiatric/Behavioral: Negative.     Allergies  Review of patient's allergies indicates no known allergies.  Home Medications   Current Outpatient  Rx  Name Route Sig Dispense Refill  . ASPIRIN 325 MG PO TABS Oral Take 1 tablet (325 mg total) by mouth daily. 30 tablet 0  . BUPROPION HCL ER (XL) 150 MG PO TB24 Oral Take 150 mg by mouth 2 (two) times daily.    . CYCLOBENZAPRINE HCL 10 MG PO TABS Oral Take 5-10 mg by mouth 3 (three) times daily as needed. Muscle spam    . DIAZEPAM 5 MG PO TABS Oral Take 5 mg by mouth 2 (two) times daily.    Marland Kitchen LOSARTAN POTASSIUM-HCTZ 50-12.5 MG PO TABS Oral Take 1 tablet by mouth daily.    Marland Kitchen OMEPRAZOLE 20 MG PO CPDR Oral Take 20 mg by mouth daily.    Marland Kitchen PREDNISONE (PAK) 5 MG PO TABS Oral Take 5 mg by mouth.    . TRAMADOL HCL 50 MG PO TABS Oral Take 50 mg by mouth every 6 (six) hours as needed.      BP 155/81  Pulse 90  Temp(Src) 97.7 F (36.5 C) (Oral)  SpO2 99%  Physical Exam  Nursing note and vitals reviewed. Constitutional: He is oriented to person, place, and time. He appears well-developed and well-nourished.  HENT:  Head: Normocephalic and atraumatic.  Eyes: Conjunctivae are normal.  Neck: Neck supple.  Cardiovascular: Normal rate, regular rhythm and normal heart sounds.   Pulmonary/Chest: Effort normal and breath sounds normal. No respiratory distress.  Abdominal: Soft. Bowel sounds are normal. He exhibits no distension. There is no tenderness.  Musculoskeletal: He exhibits tenderness.  Left hip deformity noted. Leg internally rotated. Good sensation in leg and foot, normal dorsal pedal pulse  Neurological: He is alert and oriented to person, place, and time.  Skin: Skin is warm and dry.  Psychiatric: He has a normal mood and affect.    ED Course  Procedures (including critical care time)  Left prosthesis is dislocated. Spoke with Dr. Leslee Home, they will be by to see pt, unsure how long will be, since in procedure right now.   Results for orders placed during the hospital encounter of 06/11/11  CBC      Component Value Range   WBC 8.4  4.0 - 10.5 (K/uL)   RBC 4.31  4.22 -  5.81 (MIL/uL)   Hemoglobin 13.7  13.0 - 17.0 (g/dL)   HCT 16.1  09.6 - 04.5 (%)   MCV 94.7  78.0 - 100.0 (fL)   MCH 31.8  26.0 - 34.0 (pg)   MCHC 33.6  30.0 - 36.0 (g/dL)   RDW 40.9  81.1 - 91.4 (%)   Platelets 252  150 - 400 (K/uL)  DIFFERENTIAL      Component Value Range   Neutrophils Relative 83 (*) 43 - 77 (%)   Neutro Abs 7.0  1.7 - 7.7 (K/uL)   Lymphocytes Relative 11 (*) 12 - 46 (%)   Lymphs Abs 0.9  0.7 - 4.0 (K/uL)   Monocytes Relative 6  3 - 12 (%)   Monocytes Absolute 0.5  0.1 - 1.0 (K/uL)   Eosinophils Relative 0  0 - 5 (%)   Eosinophils Absolute 0.0  0.0 - 0.7 (K/uL)   Basophils Relative 0  0 - 1 (%)   Basophils Absolute 0.0  0.0 - 0.1 (K/uL)  BASIC METABOLIC PANEL      Component Value Range   Sodium 138  135 - 145 (mEq/L)   Potassium 3.7  3.5 - 5.1 (mEq/L)   Chloride 102  96 - 112 (mEq/L)   CO2 26  19 - 32 (mEq/L)   Glucose, Bld 94  70 - 99 (mg/dL)   BUN 17  6 - 23 (mg/dL)   Creatinine, Ser 7.82  0.50 - 1.35 (mg/dL)   Calcium 8.9  8.4 - 95.6 (mg/dL)   GFR calc non Af Amer 68 (*) >90 (mL/min)   GFR calc Af Amer 78 (*) >90 (mL/min)   Dg Chest 2 View  06/11/2011  *RADIOLOGY REPORT*  Clinical Data: Preoperative respiratory films.  Hypertension.  CHEST - 2 VIEW  Comparison: PA and lateral chest 03/25/2011.  Findings: Minimal subsegmental atelectasis is seen in the left lung base.  Lungs otherwise clear.  Heart size is normal.  No pneumothorax.  Degenerative change about the shoulders noted.  IMPRESSION: No acute disease.  Original Report Authenticated By: Bernadene Bell. D'ALESSIO, M.D.    Dg Hip Complete Left  06/11/2011  *RADIOLOGY REPORT*  Clinical Data: Left hip pain and deformity following an injury.  LEFT HIP - COMPLETE 2+ VIEW  Comparison: 03/25/2011.  Findings: Again demonstrated is superior dislocation of the femoral component of a left total hip prosthesis.  No fractures are seen. Stable right hip prosthesis.  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  Original Report Authenticated By: Darrol Angel, M.D.      No diagnosis found.    MDM          Lottie Mussel, PA 06/12/11 2057

## 2011-06-11 NOTE — H&P (Signed)
Tom Ortiz is an 78 y.o. male.   Chief Complaint: pain in left hip. HPI: Recurrent total hip dislocations bilateral. Today he dislocated his left totl hip.  Past Medical History  Diagnosis Date  . Hypertension     Past Surgical History  Procedure Date  . Joint replacement     Bilateral hip  . Hip closed reduction 03/25/2011    Procedure: CLOSED MANIPULATION HIP;  Surgeon: Javier Docker;  Location: [facility];  Service: Orthopedics;  Laterality: Left;    History reviewed. No pertinent family history. Social History:  reports that he has never smoked. He has never used smokeless tobacco. He reports that he does not drink alcohol or use illicit drugs.  Allergies:  Allergies  Allergen Reactions  . Quinolones Hives and Rash    78 yr old allergy    Medications Prior to Admission  Medication Dose Route Frequency Provider Last Rate Last Dose  . fentaNYL (SUBLIMAZE) injection 100 mcg  100 mcg Intravenous Once Tatyana A Kirichenko, PA   100 mcg at 06/11/11 1514  . HYDROmorphone (DILAUDID) injection 1 mg  1 mg Intravenous Once Dione Booze, MD   1 mg at 06/11/11 1736  . ondansetron (ZOFRAN) injection 4 mg  4 mg Intravenous Once Dione Booze, MD   4 mg at 06/11/11 1735   Medications Prior to Admission  Medication Sig Dispense Refill  . aspirin 325 MG tablet Take 1 tablet (325 mg total) by mouth daily.  30 tablet  0  . losartan-hydrochlorothiazide (HYZAAR) 50-12.5 MG per tablet Take 1 tablet by mouth daily.      . mirtazapine (REMERON) 30 MG tablet Take 30 mg by mouth at bedtime.      . predniSONE (STERAPRED UNI-PAK) 5 MG TABS Take 5 mg by mouth.        Results for orders placed during the hospital encounter of 06/11/11 (from the past 48 hour(s))  CBC     Status: Normal   Collection Time   06/11/11  3:15 PM      Component Value Range Comment   WBC 8.4  4.0 - 10.5 (K/uL)    RBC 4.31  4.22 - 5.81 (MIL/uL)    Hemoglobin 13.7  13.0 - 17.0 (g/dL)    HCT 16.1  09.6 - 04.5 (%)    MCV  94.7  78.0 - 100.0 (fL)    MCH 31.8  26.0 - 34.0 (pg)    MCHC 33.6  30.0 - 36.0 (g/dL)    RDW 40.9  81.1 - 91.4 (%)    Platelets 252  150 - 400 (K/uL)   DIFFERENTIAL     Status: Abnormal   Collection Time   06/11/11  3:15 PM      Component Value Range Comment   Neutrophils Relative 83 (*) 43 - 77 (%)    Neutro Abs 7.0  1.7 - 7.7 (K/uL)    Lymphocytes Relative 11 (*) 12 - 46 (%)    Lymphs Abs 0.9  0.7 - 4.0 (K/uL)    Monocytes Relative 6  3 - 12 (%)    Monocytes Absolute 0.5  0.1 - 1.0 (K/uL)    Eosinophils Relative 0  0 - 5 (%)    Eosinophils Absolute 0.0  0.0 - 0.7 (K/uL)    Basophils Relative 0  0 - 1 (%)    Basophils Absolute 0.0  0.0 - 0.1 (K/uL)   BASIC METABOLIC PANEL     Status: Abnormal   Collection Time  06/11/11  3:15 PM      Component Value Range Comment   Sodium 138  135 - 145 (mEq/L)    Potassium 3.7  3.5 - 5.1 (mEq/L)    Chloride 102  96 - 112 (mEq/L)    CO2 26  19 - 32 (mEq/L)    Glucose, Bld 94  70 - 99 (mg/dL)    BUN 17  6 - 23 (mg/dL)    Creatinine, Ser 8.11  0.50 - 1.35 (mg/dL)    Calcium 8.9  8.4 - 10.5 (mg/dL)    GFR calc non Af Amer 68 (*) >90 (mL/min)    GFR calc Af Amer 78 (*) >90 (mL/min)    Dg Chest 2 View  06/11/2011  *RADIOLOGY REPORT*  Clinical Data: Preoperative respiratory films.  Hypertension.  CHEST - 2 VIEW  Comparison: PA and lateral chest 03/25/2011.  Findings: Minimal subsegmental atelectasis is seen in the left lung base.  Lungs otherwise clear.  Heart size is normal.  No pneumothorax.  Degenerative change about the shoulders noted.  IMPRESSION: No acute disease.  Original Report Authenticated By: Bernadene Bell. D'ALESSIO, M.D.   Dg Hip Complete Left  06/11/2011  *RADIOLOGY REPORT*  Clinical Data: Left hip pain and deformity following an injury.  LEFT HIP - COMPLETE 2+ VIEW  Comparison: 03/25/2011.  Findings: Again demonstrated is superior dislocation of the femoral component of a left total hip prosthesis.  No fractures are seen. Stable right hip  prosthesis.  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  Original Report Authenticated By: Darrol Angel, M.D.    Review of Systems  Constitutional: Negative.   HENT: Negative.   Eyes: Negative.   Cardiovascular: Negative.   Gastrointestinal: Negative.   Genitourinary: Positive for frequency.  Musculoskeletal: Positive for joint pain.  Skin: Negative.   Neurological: Negative.   Endo/Heme/Allergies: Negative.   Psychiatric/Behavioral: Negative.     Blood pressure 140/80, pulse 94, temperature 97.7 F (36.5 C), temperature source Oral, resp. rate 19, SpO2 89.00%. Physical Exam  Constitutional: He is oriented to person, place, and time. He appears well-developed and well-nourished.  HENT:  Head: Normocephalic and atraumatic.  Eyes: Conjunctivae are normal. Pupils are equal, round, and reactive to light.  Neck: Normal range of motion. Neck supple.  Cardiovascular: Normal rate and regular rhythm.   Respiratory: Effort normal and breath sounds normal.  GI: Soft. Bowel sounds are normal.  Musculoskeletal: He exhibits tenderness.  Neurological: He is alert and oriented to person, place, and time.  Skin: Skin is warm and dry.  Psychiatric: He has a normal mood and affect.     Assessment/Plan Plan to close reduce left total hip.  Britta Mccreedy 06/11/2011, 6:31 PM

## 2011-06-11 NOTE — Discharge Summary (Signed)
Physician Discharge Summary  Patient ID: Tom Ortiz MRN: [id number] DOB/AGE: Jan 08, 1928 78 y.o.  Admit date: 06/11/2011 Discharge date: 06/11/2011  Admission Diagnoses:Dislocated left total hip.  Discharge Diagnoses: Reduced left total hip. Active Problems:  * No active hospital problems. *    Discharged Condition: good  Hospital Course: Did well post reduction of left total hip.  Consults: None  Significant Diagnostic Studies: radiology: X-Ray: Dislocated left total hip.  Treatments: surgery: Closed reduction of left total hip.  Discharge Exam: Blood pressure 140/80, pulse 94, temperature 97.7 F (36.5 C), temperature source Oral, resp. rate 19, SpO2 89.00%. Neurologic: Sensory: normal  Disposition: 01-Home or Self Care   Medication List  As of 06/11/2011  6:41 PM   ASK your doctor about these medications         aspirin 325 MG tablet   Take 1 tablet (325 mg total) by mouth daily.      calcium carbonate-magnesium hydroxide 334 MG Chew   Commonly known as: ROLAIDS   Chew 2 tablets by mouth 2 (two) times daily with a meal.      LORazepam 0.5 MG tablet   Commonly known as: ATIVAN   Take 0.5 mg by mouth at bedtime.      losartan-hydrochlorothiazide 50-12.5 MG per tablet   Commonly known as: HYZAAR   Take 1 tablet by mouth daily.      mirtazapine 30 MG tablet   Commonly known as: REMERON   Take 30 mg by mouth at bedtime.      oxycodone 5 MG capsule   Commonly known as: OXY-IR   Take 5-10 mg by mouth every 4 (four) hours as needed. For pain      predniSONE 5 MG Tabs   Commonly known as: STERAPRED UNI-PAK   Take 5 mg by mouth.             Signed: Britta Mccreedy 06/11/2011, 6:41 PM

## 2011-06-11 NOTE — Preoperative (Signed)
Beta Blockers   Reason not to administer Beta Blockers:Not Applicable 

## 2011-06-11 NOTE — Anesthesia Procedure Notes (Signed)
Procedure Name: LMA Insertion Date/Time: 06/11/2011 7:25 PM Performed by: Catalina Pizza Pre-anesthesia Checklist: Patient identified, Emergency Drugs available, Suction available and Patient being monitored Patient Re-evaluated:Patient Re-evaluated prior to inductionOxygen Delivery Method: Circle system utilized Preoxygenation: Pre-oxygenation with 100% oxygen Intubation Type: IV induction Ventilation: Mask ventilation without difficulty LMA: LMA with gastric port inserted LMA Size: 5.0 Number of attempts: 1 Placement Confirmation: positive ETCO2 and breath sounds checked- equal and bilateral Tube secured with: Tape Dental Injury: Teeth and Oropharynx as per pre-operative assessment

## 2011-06-11 NOTE — Transfer of Care (Signed)
Immediate Anesthesia Transfer of Care Note  Patient: Tom Ortiz  Procedure(s) Performed: Procedure(s) (LRB): CLOSED MANIPULATION HIP (Left)  Patient Location: PACU  Anesthesia Type: General  Level of Consciousness: awake, alert  and oriented  Airway & Oxygen Therapy: Patient Spontanous Breathing and Patient connected to face mask oxygen  Post-op Assessment: Report given to PACU RN and Post -op Vital signs reviewed and stable  Post vital signs: Reviewed and stable  Complications: No apparent anesthesia complications

## 2011-06-11 NOTE — Discharge Instructions (Signed)
We will discuss your case with Dr. Lequita Halt and make a plan for your hips. Continue with hip precautions.Closed Reduction for Artificial Hip Dislocation  Care After Please read the instructions outlined below. Refer to these instructions for the next few weeks. These discharge instructions provide you with general information on caring for yourself after your closed reduction. Your caregiver may also give you specific instructions. If you have any problems or questions after discharge, please call your caregiver. HOME CARE INSTRUCTIONS   You may resume a normal diet and activities as directed or allowed.   Only take over-the-counter or prescription medicines for pain, discomfort, or fever as directed by your caregiver.   Use crutches as instructed. When you are allowed full weight bearing, begin slowly. Reduce the activity if it is causing pain or discomfort.   If you have been given a brace, wear it as instructed.   Apply ice to the injured hip for 15 to 20 minutes, 3 to 4 times per day while awake for 2 days. Put the ice in a plastic bag and place a thin towel between the bag of ice and your cast.   Do range of motion exercises only as instructed by your caregiver.  SEEK MEDICAL CARE IF:   You have swelling or tenderness of your calf or leg.   You develop shortness of breath.   You have increasing pain or discomfort in your hip which is not relieved with medicine.   You notice increasing swelling around the hip.   You have any numbness or tingling in your hip or leg.   You are not improving or are getting worse.   You have any other questions or concerns.  SEEK IMMEDIATE MEDICAL CARE IF:   Your hip re-dislocates.   You develop a rash.   You have difficulty breathing.   You develop chest pain.   You develop any reaction or side effects to medicines given.  MAKE SURE YOU:   Understand these instructions.   Will watch your condition.   Will get help right away if you are  not doing well or get worse.  Document Released: 09/19/2004 Document Revised: 02/19/2011 Document Reviewed: 03/02/2005 [facility] Patient Information 2012 [place], [place].

## 2011-06-11 NOTE — Anesthesia Postprocedure Evaluation (Signed)
  Anesthesia Post-op Note  Patient: Tom Ortiz  Procedure(s) Performed: Procedure(s) (LRB): CLOSED MANIPULATION HIP (Left)  Patient Location: PACU  Anesthesia Type: General  Level of Consciousness: awake and alert   Airway and Oxygen Therapy: Patient Spontanous Breathing  Post-op Pain: mild  Post-op Assessment: Post-op Vital signs reviewed, Patient's Cardiovascular Status Stable, Respiratory Function Stable, Patent Airway and No signs of Nausea or vomiting  Post-op Vital Signs: stable  Complications: No apparent anesthesia complications  

## 2011-06-12 ENCOUNTER — Encounter (HOSPITAL_COMMUNITY): Payer: Self-pay | Admitting: Orthopedic Surgery

## 2011-06-12 MED ORDER — METHOCARBAMOL 500 MG PO TABS: 500.0000 mg | ORAL_TABLET | Freq: Four times a day (QID) | ORAL | Status: AC | PRN

## 2011-06-12 NOTE — Progress Notes (Signed)
Pt for d/c home today w/ no PT per pt. IV d/c'd. No dressing noted. F/C d/c'd--DTV. Pt encouraged to drink to void prior to d/c. No changes in am assessments today. D/c instructions & Rx given with verbalized understanding. Wife will pick him up per pt--awaiting for wife.

## 2011-06-12 NOTE — Progress Notes (Signed)
Subjective: 1 Day Post-Op Procedure(s) (LRB): CLOSED MANIPULATION HIP (Left) Patient reports pain as mild.   Patient seen in rounds with Dr. Lequita Halt. Patient doing better now and ready to go home.  Objective: Vital signs in last 24 hours: Temp:  [97.6 F (36.4 C)-98.3 F (36.8 C)] 98.3 F (36.8 C) (03/29 0620) Pulse Rate:  [72-94] 72  (03/29 0620) Resp:  [16-21] 20  (03/29 0620) BP: (120-155)/(50-89) 120/71 mmHg (03/29 0620) SpO2:  [89 %-100 %] 90 % (03/29 0620) Weight:  [81.647 kg (180 lb)] 81.647 kg (180 lb) (03/28 2046)  Intake/Output from previous day:  Intake/Output Summary (Last 24 hours) at 06/12/11 0734 Last data filed at 06/12/11 0620  Gross per 24 hour  Intake 1216.67 ml  Output   2625 ml  Net -1408.33 ml    Intake/Output this shift:    Labs:  Basename 06/11/11 1515  HGB 13.7    Basename 06/11/11 1515  WBC 8.4  RBC 4.31  HCT 40.8  PLT 252    Basename 06/11/11 1515  NA 138  K 3.7  CL 102  CO2 26  BUN 17  CREATININE 1.01  GLUCOSE 94  CALCIUM 8.9   No results found for this basename: LABPT:2,INR:2 in the last 72 hours  Exam: Neurovascular intact Sensation intact distally  Motor function intact - moving foot and toes well on exam.   Assessment/Plan: 1 Day Post-Op Procedure(s) (LRB): CLOSED MANIPULATION HIP (Left) Procedure(s) (LRB): CLOSED MANIPULATION HIP (Left) Past Medical History  Diagnosis Date  . Hypertension    Active Problems:  * No active hospital problems. *    Up with therapy Discharge home with home health Diet - heart healthy Follow up - in 2 weeks Activity - WBAT Condition Upon Discharge - Good D/C Meds - See DC Summary    Tom Ortiz, Tom Ortiz 06/12/2011, 7:34 AM

## 2011-06-12 NOTE — Op Note (Signed)
Tom Ortiz, Tom Ortiz NO.:  [id number]  MEDICAL RECORD NO.:  [id number]  LOCATION:  1609                         FACILITY:  [facility]  PHYSICIAN:  Marlowe Kays, M.D.  DATE OF BIRTH:  1927-04-26  DATE OF PROCEDURE:  06/11/2011 DATE OF DISCHARGE:                              OPERATIVE REPORT   PREOPERATIVE DIAGNOSIS:  Recurrent dislocation, left total hip replacement.  POSTOPERATIVE DIAGNOSIS:  Recurrent dislocation, left total hip replacement.  OPERATION:  Closed reduction left total hip replacement dislocation.  SURGEON:  Marlowe Kays, M.D.  ASSISTANTDruscilla Brownie. Cherlynn June.  ANESTHESIA:  General.  PATHOLOGY AND JUSTIFICATION FOR PROCEDURE:  He was simply sitting on the bed earlier today, dropped something on the floor, then over to pick it up and dislocate the hip.  He had a previous dislocation in January.  PROCEDURE IN DETAIL:  Satisfied general anesthesia, he was moved from the gurney to the operating room, radi-op table, C-arm employed with a combination of traction and internal rotation with both Mr. [name] and me performing an assisting each other.  The hip was reduced.  This was confirmed with C-arm pictures.  He will be placed in knee immobilizer, and kept overnight because of lateness of the OR.  No operative complications.          ______________________________ Marlowe Kays, M.D.     JA/MEDQ  D:  06/11/2011  T:  06/12/2011  Job:  [id number]

## 2011-06-12 NOTE — ED Provider Notes (Signed)
Medical screening examination/treatment/procedure(s) were performed by non-physician practitioner and as supervising physician I was immediately available for consultation/collaboration.   Dayton Bailiff, MD 06/12/11 2203

## 2011-06-30 NOTE — Discharge Summary (Signed)
Physician Discharge Summary   Patient ID: Tom Ortiz MRN: [id number] DOB/AGE: [id number] 76 y.o.  Admit date: 06/11/2011 Discharge date: 06/12/2011  Primary Diagnosis: Dislocated left total hip  Admission Diagnoses: Past Medical History  Diagnosis Date  . Hypertension     Discharge Diagnoses:  Active Problems:  * No active hospital problems. *    Procedure: Procedure(s) (LRB): CLOSED MANIPULATION HIP (Left)   Consults: None  HPI: He was simply sitting on the bed earlier today, dropped something on the floor, then over to pick it up and dislocate the hip. He had a previous dislocation in January.  Laboratory Data: Admission on 03/25/2011, Discharged on 03/26/2011  Component Date Value Range Status  . Total CK (U/L) 03/25/2011 41  7-232 Final  . CK, MB (ng/mL) 03/25/2011 2.8  0.3-4.0 Final  . Troponin I (ng/mL) 03/25/2011 <0.30  <0.30 Final   Comment:                                 Due to the release kinetics of cTnI,                          a negative result within the first hours                          of the onset of symptoms does not rule out                          myocardial infarction with certainty.                          If myocardial infarction is still suspected,                          repeat the test at appropriate intervals.  . Relative Index  03/25/2011 RELATIVE INDEX IS INVALID  0.0-2.5 Final   Comment: WHEN CK < 100 U/L                                  . Sodium (mEq/L) 03/25/2011 135  135-145 Final  . Potassium (mEq/L) 03/25/2011 4.5  3.5-5.1 Final  . Chloride (mEq/L) 03/25/2011 99  96-112 Final  . CO2 (mEq/L) 03/25/2011 26  19-32 Final  . Glucose, Bld (mg/dL) 08/65/7846 95  96-29 Final  . BUN (mg/dL) 52/84/1324 15  4-01 Final  . Creatinine, Ser (mg/dL) 02/72/5366 4.40  3.47-4.25 Final  . Calcium (mg/dL) 95/63/8756 8.9  4.3-32.9 Final  . GFR calc non Af Amer (mL/min) 03/25/2011 68* >90 Final  . GFR calc Af Amer (mL/min) 03/25/2011 78*  >90 Final   Comment:                                 The eGFR has been calculated                          using the CKD EPI equation.                          This calculation has not been  validated in all clinical                          situations.                          eGFR's persistently                          <90 mL/min signify                          possible Chronic Kidney Disease.  . WBC (K/uL) 03/25/2011 6.8  4.0-10.5 Final  . RBC (MIL/uL) 03/25/2011 4.07* 4.22-5.81 Final  . Hemoglobin (g/dL) 82/95/6213 08.6* 57.8-46.9 Final  . HCT (%) 03/25/2011 38.5* 39.0-52.0 Final  . MCV (fL) 03/25/2011 94.6  78.0-100.0 Final  . MCH (pg) 03/25/2011 30.5  26.0-34.0 Final  . MCHC (g/dL) 62/95/2841 32.4  40.1-02.7 Final  . RDW (%) 03/25/2011 13.3  11.5-15.5 Final  . Platelets (K/uL) 03/25/2011 233  150-400 Final  . aPTT (seconds) 03/25/2011 36  24-37 Final  . Prothrombin Time (seconds) 03/25/2011 14.0  11.6-15.2 Final  . INR  03/25/2011 1.06  0.00-1.49 Final  . Blood Bank Specimen  03/25/2011 SAMPLE AVAILABLE FOR TESTING   Final  . Sample Expiration  03/25/2011 03/28/2011   Final   No results found for this basename: HGB:5 in the last 72 hours No results found for this basename: WBC:2,RBC:2,HCT:2,PLT:2 in the last 72 hours No results found for this basename: NA:2,K:2,CL:2,CO2:2,BUN:2,CREATININE:2,GLUCOSE:2,CALCIUM:2 in the last 72 hours No results found for this basename: LABPT:2,INR:2 in the last 72 hours  X-Rays:Dg Chest 2 View  06/11/2011  *RADIOLOGY REPORT*  Clinical Data: Preoperative respiratory films.  Hypertension.  CHEST - 2 VIEW  Comparison: PA and lateral chest 03/25/2011.  Findings: Minimal subsegmental atelectasis is seen in the left lung base.  Lungs otherwise clear.  Heart size is normal.  No pneumothorax.  Degenerative change about the shoulders noted.  IMPRESSION: No acute disease.  Original Report Authenticated By: Bernadene Bell. D'ALESSIO, M.D.     Dg Hip 1 View Left  06/12/2011  *RADIOLOGY REPORT*  Clinical Data: Closed reduction of left hip prosthesis dislocation.  LEFT HIP - 1 VIEW:  Comparison: Left hip radiographs performed earlier today at 02:57 p.m.  Findings: A single fluoroscopic C-arm image is provided from the OR.  This demonstrates successful reduction of the patient's left hip prosthesis dislocation.  No associated fracture is seen.  The left hip prosthesis is incompletely imaged on this study.  IMPRESSION: Successful reduction of left hip prosthesis dislocation.  Original Report Authenticated By: Tonia Ghent, M.D.   Dg Hip Complete Left  06/11/2011  *RADIOLOGY REPORT*  Clinical Data: Left hip pain and deformity following an injury.  LEFT HIP - COMPLETE 2+ VIEW  Comparison: 03/25/2011.  Findings: Again demonstrated is superior dislocation of the femoral component of a left total hip prosthesis.  No fractures are seen. Stable right hip prosthesis.  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  Original Report Authenticated By: Darrol Angel, M.D.   Dg C-arm 1-60 Min-no Report  06/23/2011  *RADIOLOGY REPORT*  Clinical Data: Closed reduction of left hip prosthesis dislocation.  LEFT HIP - 1 VIEW:  Comparison: Left hip radiographs performed earlier today at 02:57 p.m.  Findings: A single fluoroscopic C-arm image is provided from the OR.  This demonstrates successful  reduction of the patient's left hip prosthesis dislocation.  No associated fracture is seen.  The left hip prosthesis is incompletely imaged on this study.  IMPRESSION: Successful reduction of left hip prosthesis dislocation.  Original Report Authenticated By: Tonia Ghent, M.D.    EKG: Orders placed during the hospital encounter of 06/11/11  . EKG 12-LEAD  . EKG 12-LEAD  . EKG     Hospital Course: Patient came into the hospital for recurrent total hip dislocation. He was simply sitting on the bed earlier today, dropped something on the  floor, then over to pick it  up and dislocate the hip. He had a previous dislocation in January.  Discharge Medications: Prior to Admission medications   Medication Sig Start Date End Date Taking? Authorizing Provider  aspirin 325 MG tablet Take 1 tablet (325 mg total) by mouth daily. 03/26/11 03/25/12 Yes Liam Graham, PA  calcium carbonate-magnesium hydroxide (ROLAIDS) 334 MG CHEW Chew 2 tablets by mouth 2 (two) times daily with a meal.   Yes Historical Provider, MD  LORazepam (ATIVAN) 0.5 MG tablet Take 0.5 mg by mouth at bedtime.   Yes Historical Provider, MD  losartan-hydrochlorothiazide (HYZAAR) 50-12.5 MG per tablet Take 1 tablet by mouth daily.   Yes Historical Provider, MD  mirtazapine (REMERON) 30 MG tablet Take 30 mg by mouth at bedtime.   Yes Historical Provider, MD  oxycodone (OXY-IR) 5 MG capsule Take 5-10 mg by mouth every 4 (four) hours as needed. For pain   Yes Historical Provider, MD  predniSONE (STERAPRED UNI-PAK) 5 MG TABS Take 5 mg by mouth.   Yes Historical Provider, MD    Diet: heart healthy  Activity:WBAT No bending hip over 90 degrees- A "L" Angle Do not cross legs Do not let foot roll inward  When turning these patients a pillow should be placed between the patient's legs to prevent crossing.  Patients should have the affected knee fully extended when trying to sit or stand from all surfaces to prevent excessive hip flexion.  When ambulating and turning toward the affected side the affected leg should have the toes turned out prior to moving the walker and the rest of patient's body as to prevent internal rotation/ turning in of the leg.  Abduction pillows are the most effective way to prevent a patient from not crossing legs or turning toes in at rest. If an abduction pillow is not ordered placing a regular pillow length wise between the patient's legs is also an effective reminder.  It is imperative that these precautions be maintained so that the  surgical hip does not dislocate.    Follow-up:in 2 weeks  Disposition: home  Discharged Condition: good   Discharge Orders    Future Orders Please Complete By Expires   Diet - low sodium heart healthy      Call MD / Call 911      Comments:   If you experience chest pain or shortness of breath, CALL 911 and be transported to the hospital emergency room.  If you develope a fever above 101 F, pus (white drainage) or increased drainage or redness at the wound, or calf pain, call your surgeon's office.   Constipation Prevention      Comments:   Drink plenty of fluids.  Prune juice may be helpful.  You may use a stool softener, such as Colace (over the counter) 100 mg twice a day.  Use MiraLax (over the counter) for constipation as needed.   Increase activity slowly  as tolerated      Weight Bearing as taught in Physical Therapy      Comments:   Use a walker or crutches as instructed.   Discharge instructions      Comments:   Pick up stool softner and laxative for home. May shower. Continue to use ice for pain and swelling from surgery. Hip precautions.  Total Hip Protocol.    Lifting restrictions      Comments:   No lifting     Medication List  As of 06/30/2011 10:16 PM   TAKE these medications         aspirin 325 MG tablet   Take 1 tablet (325 mg total) by mouth daily.      calcium carbonate-magnesium hydroxide 334 MG Chew   Commonly known as: ROLAIDS   Chew 2 tablets by mouth 2 (two) times daily with a meal.      LORazepam 0.5 MG tablet   Commonly known as: ATIVAN   Take 0.5 mg by mouth at bedtime.      losartan-hydrochlorothiazide 50-12.5 MG per tablet   Commonly known as: HYZAAR   Take 1 tablet by mouth daily.      mirtazapine 30 MG tablet   Commonly known as: REMERON   Take 30 mg by mouth at bedtime.      oxycodone 5 MG capsule   Commonly known as: OXY-IR   Take 5-10 mg by mouth every 4 (four) hours as needed. For pain      predniSONE 5 MG Tabs   Commonly  known as: STERAPRED UNI-PAK   Take 5 mg by mouth.           Follow-up Information    Follow up with Thayer Headings, MD .         Signed: ARLYNN, MCDERMID 06/30/2011, 10:16 PM

## 2011-07-01 ENCOUNTER — Ambulatory Visit (HOSPITAL_COMMUNITY)
Admission: EM | Admit: 2011-07-01 | Discharge: 2011-07-01 | Disposition: A | Discharge status: Home / Self Care | Payer: MEDICARE | Attending: Emergency Medicine | Admitting: Emergency Medicine

## 2011-07-01 ENCOUNTER — Other Ambulatory Visit: Payer: Self-pay | Admitting: Surgical

## 2011-07-01 ENCOUNTER — Emergency Department (HOSPITAL_COMMUNITY): Payer: MEDICARE

## 2011-07-01 ENCOUNTER — Encounter (HOSPITAL_COMMUNITY)
Admission: EM | Disposition: A | Discharge status: Home / Self Care | Payer: Self-pay | Source: Home / Self Care | Attending: Emergency Medicine

## 2011-07-01 ENCOUNTER — Encounter (HOSPITAL_COMMUNITY): Payer: Self-pay | Admitting: Anesthesiology

## 2011-07-01 ENCOUNTER — Emergency Department (HOSPITAL_COMMUNITY): Payer: MEDICARE | Admitting: Anesthesiology

## 2011-07-01 DIAGNOSIS — T84029A Dislocation of unspecified internal joint prosthesis, initial encounter: Secondary | ICD-10-CM | POA: Insufficient documentation

## 2011-07-01 DIAGNOSIS — Z79899 Other long term (current) drug therapy: Secondary | ICD-10-CM | POA: Insufficient documentation

## 2011-07-01 DIAGNOSIS — Z96649 Presence of unspecified artificial hip joint: Secondary | ICD-10-CM

## 2011-07-01 DIAGNOSIS — I10 Essential (primary) hypertension: Secondary | ICD-10-CM | POA: Insufficient documentation

## 2011-07-01 DIAGNOSIS — X58XXXA Exposure to other specified factors, initial encounter: Secondary | ICD-10-CM | POA: Insufficient documentation

## 2011-07-01 DIAGNOSIS — Z7982 Long term (current) use of aspirin: Secondary | ICD-10-CM | POA: Insufficient documentation

## 2011-07-01 HISTORY — PX: HIP CLOSED REDUCTION: SHX983

## 2011-07-01 LAB — BASIC METABOLIC PANEL
BUN: 16 mg/dL (ref 6–23)
CO2: 25 mEq/L (ref 19–32)
Calcium: 8.8 mg/dL (ref 8.4–10.5)
Chloride: 104 mEq/L (ref 96–112)
Creatinine, Ser: 0.99 mg/dL (ref 0.50–1.35)
GFR calc Af Amer: 87 mL/min — ABNORMAL LOW (ref >90)
GFR calc non Af Amer: 75 mL/min — ABNORMAL LOW (ref >90)
Glucose, Bld: 90 mg/dL (ref 70–99)
Potassium: 4.3 mEq/L (ref 3.5–5.1)
Sodium: 139 mEq/L (ref 135–145)

## 2011-07-01 LAB — CBC
HCT: 38.8 % — ABNORMAL LOW (ref 39.0–52.0)
Hemoglobin: 12.9 g/dL — ABNORMAL LOW (ref 13.0–17.0)
MCH: 31.3 pg (ref 26.0–34.0)
MCHC: 33.2 g/dL (ref 30.0–36.0)
MCV: 94.2 fL (ref 78.0–100.0)
Platelets: 287 10*3/uL (ref 150–400)
RBC: 4.12 MIL/uL — ABNORMAL LOW (ref 4.22–5.81)
RDW: 13.7 % (ref 11.5–15.5)
WBC: 8.8 10*3/uL (ref 4.0–10.5)

## 2011-07-01 SURGERY — CLOSED MANIPULATION, JOINT, HIP
Anesthesia: General | Site: Hip | Laterality: Left | Wound class: Clean

## 2011-07-01 MED ORDER — FENTANYL CITRATE 0.05 MG/ML IJ SOLN
INTRAMUSCULAR | Status: DC | PRN
  Administered 2011-07-01 (×2): 50 ug via INTRAVENOUS

## 2011-07-01 MED ORDER — DIAZEPAM 5 MG/ML IJ SOLN
5.0000 mg | Freq: Once | INTRAMUSCULAR | Status: AC
  Administered 2011-07-01: 5 mg via INTRAVENOUS
  Filled 2011-07-01: qty 2

## 2011-07-01 MED ORDER — KETAMINE HCL 50 MG/ML IJ SOLN
INTRAMUSCULAR | Status: DC | PRN
  Administered 2011-07-01: 10 mg via INTRAMUSCULAR

## 2011-07-01 MED ORDER — ONDANSETRON HCL 4 MG/2ML IJ SOLN
4.0000 mg | Freq: Once | INTRAMUSCULAR | Status: AC
  Administered 2011-07-01: 4 mg via INTRAVENOUS
  Filled 2011-07-01: qty 2

## 2011-07-01 MED ORDER — PROMETHAZINE HCL 25 MG/ML IJ SOLN: 6.2500 mg | INTRAMUSCULAR | Status: DC | PRN

## 2011-07-01 MED ORDER — FENTANYL CITRATE 0.05 MG/ML IJ SOLN: 25.0000 ug | INTRAMUSCULAR | Status: DC | PRN

## 2011-07-01 MED ORDER — SODIUM CHLORIDE 0.9 % IV SOLN
INTRAVENOUS | Status: DC | PRN
  Administered 2011-07-01: 17:00:00 via INTRAVENOUS

## 2011-07-01 MED ORDER — HYDROMORPHONE HCL PF 1 MG/ML IJ SOLN
1.0000 mg | Freq: Once | INTRAMUSCULAR | Status: AC
  Administered 2011-07-01: 1 mg via INTRAVENOUS
  Filled 2011-07-01: qty 1

## 2011-07-01 MED ORDER — LACTATED RINGERS IV SOLN
INTRAVENOUS | Status: DC | PRN
  Administered 2011-07-01: 18:00:00 via INTRAVENOUS

## 2011-07-01 MED ORDER — ASPIRIN 325 MG PO TABS: 325.0000 mg | ORAL_TABLET | Freq: Every day | ORAL | Status: DC

## 2011-07-01 MED ORDER — ONDANSETRON HCL 4 MG/2ML IJ SOLN
INTRAMUSCULAR | Status: DC | PRN
  Administered 2011-07-01 (×2): 2 mg via INTRAVENOUS

## 2011-07-01 MED ORDER — MEPERIDINE HCL 50 MG/ML IJ SOLN: 6.2500 mg | INTRAMUSCULAR | Status: DC | PRN

## 2011-07-01 MED ORDER — PROPOFOL 10 MG/ML IV BOLUS
INTRAVENOUS | Status: DC | PRN
  Administered 2011-07-01: 200 mg via INTRAVENOUS

## 2011-07-01 SURGICAL SUPPLY — 13 items
BANDAGE ADHESIVE 1X3 (GAUZE/BANDAGES/DRESSINGS) IMPLANT
CLOTH BEACON ORANGE TIMEOUT ST (SAFETY) ×2 IMPLANT
GLOVE ECLIPSE 8.0 STRL XLNG CF (GLOVE) ×1 IMPLANT
GLOVE INDICATOR 8.0 STRL GRN (GLOVE) ×2 IMPLANT
IMMOBILIZER KNEE 20 (SOFTGOODS) ×2
IMMOBILIZER KNEE 20 THIGH 36 (SOFTGOODS) ×1 IMPLANT
MANIFOLD NEPTUNE II (INSTRUMENTS) ×1 IMPLANT
NDL SAFETY ECLIPSE 18X1.5 (NEEDLE) IMPLANT
NEEDLE HYPO 18GX1.5 SHARP (NEEDLE)
POSITIONER SURGICAL ARM (MISCELLANEOUS) ×1 IMPLANT
SPONGE GAUZE 4X4 12PLY (GAUZE/BANDAGES/DRESSINGS) IMPLANT
SYR CONTROL 10ML LL (SYRINGE) IMPLANT
TRAY FOLEY CATH 14FRSI W/METER (CATHETERS) ×2 IMPLANT

## 2011-07-01 NOTE — Anesthesia Preprocedure Evaluation (Addendum)
Anesthesia Evaluation  Patient identified by MRN, date of birth, ID band Patient awake    Reviewed: Allergy & Precautions, H&P , NPO status , Patient's Chart, lab work & pertinent test results  Airway Mallampati: II TM Distance: >3 FB Neck ROM: Full    Dental No notable dental hx.    Pulmonary neg pulmonary ROS,  breath sounds clear to auscultation  Pulmonary exam normal       Cardiovascular hypertension, Pt. on medications Rhythm:Regular Rate:Normal     Neuro/Psych negative neurological ROS  negative psych ROS   GI/Hepatic Neg liver ROS, GERD-  Medicated,  Endo/Other  negative endocrine ROS  Renal/GU negative Renal ROS  negative genitourinary   Musculoskeletal negative musculoskeletal ROS (+)   Abdominal   Peds negative pediatric ROS (+)  Hematology negative hematology ROS (+)   Anesthesia Other Findings   Reproductive/Obstetrics negative OB ROS                           Anesthesia Physical Anesthesia Plan  ASA: II  Anesthesia Plan: General   Post-op Pain Management:    Induction: Intravenous  Airway Management Planned: LMA and Mask  Additional Equipment:   Intra-op Plan:   Post-operative Plan:   Informed Consent: I have reviewed the patients History and Physical, chart, labs and discussed the procedure including the risks, benefits and alternatives for the proposed anesthesia with the patient or authorized representative who has indicated his/her understanding and acceptance.   Dental advisory given  Plan Discussed with: CRNA  Anesthesia Plan Comments:         Anesthesia Quick Evaluation

## 2011-07-01 NOTE — Anesthesia Postprocedure Evaluation (Signed)
  Anesthesia Post-op Note  Patient: Tom Ortiz  Procedure(s) Performed: Procedure(s) (LRB): CLOSED MANIPULATION HIP (Left)  Patient Location: PACU  Anesthesia Type: General  Level of Consciousness: awake and alert   Airway and Oxygen Therapy: Patient Spontanous Breathing  Post-op Pain: mild  Post-op Assessment: Post-op Vital signs reviewed, Patient's Cardiovascular Status Stable, Respiratory Function Stable, Patent Airway and No signs of Nausea or vomiting  Post-op Vital Signs: stable  Complications: No apparent anesthesia complications  

## 2011-07-01 NOTE — ED Provider Notes (Signed)
History    78 year old male with left hip pain. Acute onset shortly before arrival when trying to put on his socks. Patient with b/l hip prosthesis and subsequent prior dislocations. No numbness or tingling. Cant move hip because of pain. No other complaints.   CSN: [id number]  Arrival date & time 07/01/11  1206   First MD Initiated Contact with Patient 07/01/11 1227      Chief Complaint  Patient presents with  . Hip Pain    (Consider location/radiation/quality/duration/timing/severity/associated sxs/prior treatment) HPI  Past Medical History  Diagnosis Date  . Hypertension     Past Surgical History  Procedure Date  . Joint replacement     Bilateral hip  . Hip closed reduction 03/25/2011    Procedure: CLOSED MANIPULATION HIP;  Surgeon: Javier Docker;  Location: [facility]S;  Service: Orthopedics;  Laterality: Left;  . Hip closed reduction 06/11/2011    Procedure: CLOSED MANIPULATION HIP;  Surgeon: Drucilla Schmidt, MD;  Location: [facility];  Service: Orthopedics;  Laterality: Left;    No family history on file.  History  Substance Use Topics  . Smoking status: Never Smoker   . Smokeless tobacco: Never Used  . Alcohol Use: No      Review of Systems   Review of symptoms negative unless otherwise noted in HPI.   Allergies  Quinolones  Home Medications   Current Outpatient Rx  Name Route Sig Dispense Refill  . ASPIRIN 325 MG PO TABS Oral Take 1 tablet (325 mg total) by mouth daily. 30 tablet 0  . DIHYDROXYALUMINUM SOD CARB 334 MG PO CHEW Oral Chew 2 tablets by mouth 2 (two) times daily with a meal.    . LORAZEPAM 0.5 MG PO TABS Oral Take 0.5 mg by mouth at bedtime.    Marland Kitchen LOSARTAN POTASSIUM-HCTZ 50-12.5 MG PO TABS Oral Take 1 tablet by mouth daily.    Marland Kitchen MIRTAZAPINE 30 MG PO TABS Oral Take 30 mg by mouth at bedtime.    . OXYCODONE HCL 5 MG PO CAPS Oral Take 5-10 mg by mouth every 4 (four) hours as needed. For pain      BP 155/80  Pulse 74  Temp(Src) 97.6 F (36.4  C) (Oral)  Resp 24  SpO2 100%  Physical Exam  Nursing note and vitals reviewed. Constitutional: He appears well-developed and well-nourished. No distress.  HENT:  Head: Normocephalic and atraumatic.  Eyes: Conjunctivae are normal. Right eye exhibits no discharge. Left eye exhibits no discharge.  Neck: Neck supple.  Cardiovascular: Normal rate, regular rhythm and normal heart sounds.  Exam reveals no gallop and no friction rub.   No murmur heard. Pulmonary/Chest: Effort normal and breath sounds normal. No respiratory distress.  Abdominal: Soft. He exhibits no distension. There is no tenderness.  Musculoskeletal: He exhibits no edema and no tenderness.       Left lower extremity shortened and hip internally rotated. Range of motion limited by pain. Neurovascular intact distally  Neurological: He is alert.  Skin: Skin is warm and dry.  Psychiatric: He has a normal mood and affect. His behavior is normal. Thought content normal.    ED Course  Procedures (including critical care time)  Labs Reviewed - No data to display Dg Hip Complete Left  07/01/2011  *RADIOLOGY REPORT*  Clinical Data: Hip pain  LEFT HIP - COMPLETE 2+ VIEW  Comparison: 06/11/2011  Findings: Three views of the left hip submitted.  There is a dislocated left hip prosthesis with mild superior migration of  femoral component.  Right hip prosthesis  in anatomic alignment. No acute fracture is identified.  Diffuse osteopenia.  IMPRESSION: Diffuse osteopenia.  Dislocated left hip prosthesis.  Original Report Authenticated By: Natasha Mead, M.D.     1. Dislocation of hip prosthesis     2:46 PM Pt and wife updated that discussed with ortho and coming to assess. Currently in OR but will be here as soon as he can. Additional pain meds ordered.  MDM  78 year old male with left hip pain. X-rays are significant for a dislocation of his left hip prosthesis. Orthopedics was consulted for reduction. Pain medication as needed.        Raeford Razor, MD 07/01/11 [phone]

## 2011-07-01 NOTE — Preoperative (Signed)
Beta Blockers   Reason not to administer Beta Blockers:Not Applicable 

## 2011-07-01 NOTE — Consult Note (Signed)
I have seen and examined this patient and agree with ms constables' assessment

## 2011-07-01 NOTE — Discharge Instructions (Signed)
Closed Reduction for Artificial Hip Dislocation  Care After Please read the instructions outlined below. Refer to these instructions for the next few weeks. These discharge instructions provide you with general information on caring for yourself after your closed reduction. Your caregiver may also give you specific instructions. If you have any problems or questions after discharge, please call your caregiver. HOME CARE INSTRUCTIONS   You may resume a normal diet and activities as directed or allowed.   Only take over-the-counter or prescription medicines for pain, discomfort, or fever as directed by your caregiver.   Use crutches as instructed. When you are allowed full weight bearing, begin slowly. Reduce the activity if it is causing pain or discomfort.   If you have been given a brace, wear it as instructed.   Apply ice to the injured hip for 15 to 20 minutes, 3 to 4 times per day while awake for 2 days. Put the ice in a plastic bag and place a thin towel between the bag of ice and your cast.   Do range of motion exercises only as instructed by your caregiver.  SEEK MEDICAL CARE IF:   You have swelling or tenderness of your calf or leg.   You develop shortness of breath.   You have increasing pain or discomfort in your hip which is not relieved with medicine.   You notice increasing swelling around the hip.   You have any numbness or tingling in your hip or leg.   You are not improving or are getting worse.   You have any other questions or concerns.  SEEK IMMEDIATE MEDICAL CARE IF:   Your hip re-dislocates.   You develop a rash.   You have difficulty breathing.   You develop chest pain.   You develop any reaction or side effects to medicines given.  MAKE SURE YOU:   Understand these instructions.   Will watch your condition.   Will get help right away if you are not doing well or get worse.  Document Released: 09/19/2004 Document Revised: 02/19/2011 Document  Reviewed: 03/02/2005 [employer] Patient Information 2012 [employer]. 

## 2011-07-01 NOTE — Transfer of Care (Signed)
Immediate Anesthesia Transfer of Care Note  Patient: Tom Ortiz  Procedure(s) Performed: Procedure(s) (LRB): CLOSED MANIPULATION HIP (Left)  Patient Location: PACU  Anesthesia Type: General  Level of Consciousness: awake, alert , oriented, patient cooperative and responds to stimulation  Airway & Oxygen Therapy: Patient Spontanous Breathing and Patient connected to face mask oxygen  Post-op Assessment: Report given to PACU RN, Post -op Vital signs reviewed and stable and Patient moving all extremities  Post vital signs: Reviewed and stable  Complications: No apparent anesthesia complications

## 2011-07-01 NOTE — ED Notes (Signed)
Prosthetic hip (left) dislocated this am while attempting to put on socks. Hx of same in past on both sides. Sees Dr. Ulis Rias. Received 400mg  Fentanyl per EMS.

## 2011-07-01 NOTE — ED Notes (Signed)
Dr. Leslee Home requested to wait to get to OR to insert Foley.

## 2011-07-01 NOTE — ED Notes (Signed)
MD at bedside unable to insert foley catheter.

## 2011-07-01 NOTE — ED Notes (Signed)
To OR

## 2011-07-01 NOTE — Anesthesia Procedure Notes (Signed)
Date/Time: 07/01/2011 5:24 PM Performed by: Randon Goldsmith CATHERINE PAYNE Pre-anesthesia Checklist: Patient identified, Emergency Drugs available, Suction available and Patient being monitored Patient Re-evaluated:Patient Re-evaluated prior to inductionOxygen Delivery Method: Circle system utilized Preoxygenation: Pre-oxygenation with 100% oxygen Intubation Type: IV induction LMA: LMA inserted LMA Size: 6.0 Number of attempts: 1 Placement Confirmation: positive ETCO2,  CO2 detector and breath sounds checked- equal and bilateral Tube secured with: Tape Dental Injury: Teeth and Oropharynx as per pre-operative assessment

## 2011-07-01 NOTE — Addendum Note (Signed)
Addendum  created 07/01/11 1814 by Eilene Ghazi, MD   Modules edited:Orders, PRL Based Order Sets

## 2011-07-01 NOTE — Progress Notes (Signed)
Patient discharged via w/c with belongings. Given discharge instructions.

## 2011-07-01 NOTE — H&P (Signed)
Tom Ortiz is an 78 y.o. male.   Chief Complaint: Left hip pain  HPI: The patient is a 78 year old male with a history of multiple total hip dislocations. He last dislocated the left total hip in March of 2013 which was reduced by Dr. Simonne Come. He reports that since that time he has had no difficulty with the hip. He reports that earlier today he was sitting on his bed and leaned over in order to put on his socks and shoes when his left total hip dislocated. He denies a fall.   Past Medical History  Diagnosis Date  . Hypertension     Past Surgical History  Procedure Date  . Joint replacement     Bilateral hip  . Hip closed reduction 03/25/2011    Procedure: CLOSED MANIPULATION HIP;  Surgeon: Javier Docker;  Location: [facility];  Service: Orthopedics;  Laterality: Left;  . Hip closed reduction 06/11/2011    Procedure: CLOSED MANIPULATION HIP;  Surgeon: Drucilla Schmidt, MD;  Location: [facility];  Service: Orthopedics;  Laterality: Left;   Family history positive for CAD, CVA, hypertension, depression, and rheumatoid arthritis.  Social History:  reports that he has never smoked. He has never used smokeless tobacco. He reports that he does not drink alcohol or use illicit drugs.  Allergies:  Allergies  Allergen Reactions  . Quinolones Hives and Rash    78 yr old allergy    Medications Prior to Admission  Medication Dose Route Frequency Provider Last Rate Last Dose  . diazepam (VALIUM) injection 5 mg  5 mg Intravenous Once Raeford Razor, MD   5 mg at 07/01/11 1317  . HYDROmorphone (DILAUDID) injection 1 mg  1 mg Intravenous Once Raeford Razor, MD   1 mg at 07/01/11 1303  . HYDROmorphone (DILAUDID) injection 1 mg  1 mg Intravenous Once Raeford Razor, MD   1 mg at 07/01/11 1523  . ondansetron (ZOFRAN) injection 4 mg  4 mg Intravenous Once Raeford Razor, MD   4 mg at 07/01/11 1521   Medications Prior to Admission  Medication Sig Dispense Refill  . aspirin 325 MG tablet Take 1 tablet  (325 mg total) by mouth daily.  30 tablet  0  . calcium carbonate-magnesium hydroxide (ROLAIDS) 334 MG CHEW Chew 2 tablets by mouth 2 (two) times daily with a meal.      . LORazepam (ATIVAN) 0.5 MG tablet Take 0.5 mg by mouth at bedtime.      Marland Kitchen losartan-hydrochlorothiazide (HYZAAR) 50-12.5 MG per tablet Take 1 tablet by mouth daily.      . mirtazapine (REMERON) 30 MG tablet Take 30 mg by mouth at bedtime.      Marland Kitchen oxycodone (OXY-IR) 5 MG capsule Take 5-10 mg by mouth every 4 (four) hours as needed. For pain       Dg Hip Complete Left  07/01/2011  *RADIOLOGY REPORT*  Clinical Data: Hip pain  LEFT HIP - COMPLETE 2+ VIEW  Comparison: 06/11/2011  Findings: Three views of the left hip submitted.  There is a dislocated left hip prosthesis with mild superior migration of femoral component.  Right hip prosthesis  in anatomic alignment. No acute fracture is identified.  Diffuse osteopenia.  IMPRESSION: Diffuse osteopenia.  Dislocated left hip prosthesis.  Original Report Authenticated By: Natasha Mead, M.D.    Review of Systems  Constitutional: Negative for fever, chills and malaise/fatigue.  HENT: Negative for hearing loss, congestion, neck pain and tinnitus.   Eyes: Negative for blurred  vision, double vision and photophobia.  Respiratory: Negative for cough, hemoptysis, shortness of breath and wheezing.   Cardiovascular: Negative for chest pain, palpitations and leg swelling.  Gastrointestinal: Negative for heartburn, nausea, vomiting, abdominal pain, diarrhea and constipation.  Genitourinary: Negative for dysuria, urgency, frequency, hematuria and flank pain.  Musculoskeletal: Positive for joint pain. Negative for myalgias, back pain and falls.  Skin: Negative for itching and rash.  Neurological: Negative for dizziness, tingling, tremors, sensory change, seizures, loss of consciousness, weakness and headaches.  Endo/Heme/Allergies: Does not bruise/bleed easily.  Psychiatric/Behavioral: Negative for  depression and memory loss. The patient is not nervous/anxious and does not have insomnia.     Blood pressure 156/95, pulse 89, temperature 97.6 F (36.4 C), temperature source Oral, resp. rate 22, SpO2 100.00%. Physical Exam  Constitutional: He is oriented to person, place, and time. He appears well-developed and well-nourished.  HENT:  Head: Normocephalic and atraumatic.  Mouth/Throat: Oropharynx is clear and moist.  Eyes: Conjunctivae and EOM are normal. Right eye exhibits no discharge. Left eye exhibits no discharge.  Neck: Normal range of motion. Neck supple. No tracheal deviation present. No thyromegaly present.  Cardiovascular: Normal rate, regular rhythm, normal heart sounds and intact distal pulses.   Respiratory: Effort normal and breath sounds normal. No respiratory distress. He has no wheezes. He exhibits no tenderness.  GI: Soft. Bowel sounds are normal. He exhibits no distension and no mass. There is no tenderness.  Musculoskeletal:       Right hip: He exhibits decreased range of motion. He exhibits no deformity.       Left hip: He exhibits decreased range of motion and deformity.       Right knee: Normal.       Left knee: Normal.       Right ankle: Normal.       Left ankle: Normal.       Legs: Neurological: He is alert and oriented to person, place, and time. He has normal reflexes. No cranial nerve deficit.  Skin: Skin is warm. No rash noted. No erythema.  Psychiatric: He has a normal mood and affect. His behavior is normal.     Assessment/Plan Closed reduction of the dislocated left total hip Admit patient to orthopedic floor for observation Pt to follow-up with Dr. Lequita Halt in the office after discharge  Justyna Timoney LAUREN 07/01/2011, 4:21 PM

## 2011-07-01 NOTE — Brief Op Note (Signed)
07/01/2011  5:43 PM  PATIENT:  Tom Ortiz  78 y.o. male  PRE-OPERATIVE DIAGNOSIS:  dislocated left hip arthroplasty,recurrent,  POST-OPERATIVE DIAGNOSIS:  dislocated left hip arthroplasty, recurrent PROCEDURE:  Procedure(s) (LRB): CLOSED MANIPULATION HIP (Left)  SURGEON:  Surgeon(s) and Role:    * Drucilla Schmidt, MD - Primary  PHYSICIAN ASSISTANT:   ASSISTANTS:Ms constable PAC  ANESTHESIA:   general  EBL:  Total I/O In: 1000 [I.V.:1000] Out: 400 [Urine:400]  BLOOD ADMINISTERED:none  DRAINS: none   LOCAL MEDICATIONS USED:  NONE  SPECIMEN:  No Specimen  DISPOSITION OF SPECIMEN:  N/A  COUNTS:  YES  TOURNIQUET:  * No tourniquets in log *  DICTATION: .Other Dictation: Dictation Number [id number]  PLAN OF CARE: Discharge to home after PACU  PATIENT DISPOSITION:  PACU - hemodynamically stable.   Delay start of Pharmacological VTE agent (>24hrs) due to surgical blood loss or risk of bleeding: not applicable arthroplasty

## 2011-07-02 NOTE — Op Note (Signed)
NAMEHERBERTO, LEDWELL NO.:  [id number]  MEDICAL RECORD NO.:  [id number]  LOCATION:  [facility]                         FACILITY:  [facility]  PHYSICIAN:  Marlowe Kays, M.D.  DATE OF BIRTH:  Jun 02, 1927  DATE OF PROCEDURE:  07/01/2011 DATE OF DISCHARGE:                              OPERATIVE REPORT   PREOPERATIVE DIAGNOSIS:  Recurrent dislocation, left total hip arthroplasty.  POSTOPERATIVE DIAGNOSIS:  Recurrent dislocation, left total hip arthroplasty.  OPERATION:  Closed reduction left total hip arthroplasty with radio-op table and C-arm.  PATHOLOGY AND JUSTIFICATION FOR PROCEDURE:  This is his third dislocation since January.  PROCEDURE IN DETAIL:  Following satisfied general anesthesia and using C- arm and radio-op table, I was able to first rotate his hip internally with flexion and then when the femoral head had moved from superior to inferior and anterior, external rotation reducing the hip.  I was assisted by Dimitri Ped, PA, who helped to stabilize the pelvis and push on the femoral head.  He was then awakened.  At the time of dictation, the patient was on his way to recovery room in satisfactory condition with no known complications.          ______________________________ Marlowe Kays, M.D.     JA/MEDQ  D:  07/01/2011  T:  07/02/2011  Job:  [id number]

## 2011-07-05 ENCOUNTER — Other Ambulatory Visit: Payer: Self-pay | Admitting: Orthopedic Surgery

## 2011-07-05 MED ORDER — DEXAMETHASONE SODIUM PHOSPHATE 10 MG/ML IJ SOLN: 10.0000 mg | Freq: Once | INTRAMUSCULAR | Status: DC

## 2011-07-05 MED ORDER — BUPIVACAINE LIPOSOME 1.3 % IJ SUSP: 20.0000 mL | Freq: Once | INTRAMUSCULAR | Status: DC

## 2011-07-13 ENCOUNTER — Encounter (HOSPITAL_COMMUNITY): Payer: Self-pay | Admitting: Orthopedic Surgery

## 2011-09-18 ENCOUNTER — Encounter (HOSPITAL_COMMUNITY): Payer: Self-pay | Admitting: Pharmacy Technician

## 2011-09-28 ENCOUNTER — Encounter (HOSPITAL_COMMUNITY): Payer: Self-pay

## 2011-09-28 ENCOUNTER — Encounter (HOSPITAL_COMMUNITY)
Admission: RE | Admit: 2011-09-28 | Discharge: 2011-09-28 | Disposition: A | Discharge status: Home / Self Care | Payer: MEDICARE | Source: Ambulatory Visit | Attending: Orthopedic Surgery | Admitting: Orthopedic Surgery

## 2011-09-28 ENCOUNTER — Ambulatory Visit (HOSPITAL_COMMUNITY)
Admission: RE | Admit: 2011-09-28 | Discharge: 2011-09-28 | Disposition: A | Discharge status: Home / Self Care | Payer: MEDICARE | Source: Ambulatory Visit | Attending: Orthopedic Surgery | Admitting: Orthopedic Surgery

## 2011-09-28 DIAGNOSIS — Z01812 Encounter for preprocedural laboratory examination: Secondary | ICD-10-CM | POA: Insufficient documentation

## 2011-09-28 DIAGNOSIS — Z01818 Encounter for other preprocedural examination: Secondary | ICD-10-CM | POA: Insufficient documentation

## 2011-09-28 DIAGNOSIS — Z96649 Presence of unspecified artificial hip joint: Secondary | ICD-10-CM | POA: Insufficient documentation

## 2011-09-28 HISTORY — DX: Unspecified osteoarthritis, unspecified site: M19.90

## 2011-09-28 HISTORY — DX: Unspecified hearing loss, unspecified ear: H91.90

## 2011-09-28 HISTORY — DX: Anxiety disorder, unspecified: F41.9

## 2011-09-28 LAB — COMPREHENSIVE METABOLIC PANEL
ALT: 17 U/L (ref 0–53)
AST: 20 U/L (ref 0–37)
Albumin: 3.2 g/dL — ABNORMAL LOW (ref 3.5–5.2)
Alkaline Phosphatase: 67 U/L (ref 39–117)
BUN: 18 mg/dL (ref 6–23)
CO2: 29 mEq/L (ref 19–32)
Calcium: 8.7 mg/dL (ref 8.4–10.5)
Chloride: 103 mEq/L (ref 96–112)
Creatinine, Ser: 1.02 mg/dL (ref 0.50–1.35)
GFR calc Af Amer: 77 mL/min — ABNORMAL LOW (ref >90)
GFR calc non Af Amer: 66 mL/min — ABNORMAL LOW (ref >90)
Glucose, Bld: 99 mg/dL (ref 70–99)
Potassium: 4.4 mEq/L (ref 3.5–5.1)
Sodium: 140 mEq/L (ref 135–145)
Total Bilirubin: 0.3 mg/dL (ref 0.3–1.2)
Total Protein: 6.7 g/dL (ref 6.0–8.3)

## 2011-09-28 LAB — CBC
HCT: 42 % (ref 39.0–52.0)
Hemoglobin: 13.9 g/dL (ref 13.0–17.0)
MCH: 31.5 pg (ref 26.0–34.0)
MCHC: 33.1 g/dL (ref 30.0–36.0)
MCV: 95.2 fL (ref 78.0–100.0)
Platelets: 236 10*3/uL (ref 150–400)
RBC: 4.41 MIL/uL (ref 4.22–5.81)
RDW: 13.8 % (ref 11.5–15.5)
WBC: 6.7 10*3/uL (ref 4.0–10.5)

## 2011-09-28 LAB — URINALYSIS, ROUTINE W REFLEX MICROSCOPIC
Bilirubin Urine: NEGATIVE
Glucose, UA: NEGATIVE mg/dL
Hgb urine dipstick: NEGATIVE
Ketones, ur: NEGATIVE mg/dL
Leukocytes, UA: NEGATIVE
Nitrite: NEGATIVE
Protein, ur: NEGATIVE mg/dL
Specific Gravity, Urine: 1.026 (ref 1.005–1.030)
Urobilinogen, UA: 1 mg/dL (ref 0.0–1.0)
pH: 5.5 (ref 5.0–8.0)

## 2011-09-28 LAB — PROTIME-INR
INR: 1.03 (ref 0.00–1.49)
Prothrombin Time: 13.7 seconds (ref 11.6–15.2)

## 2011-09-28 LAB — SURGICAL PCR SCREEN
MRSA, PCR: NEGATIVE
Staphylococcus aureus: NEGATIVE

## 2011-09-28 LAB — ABO/RH: ABO/RH(D): O POS

## 2011-09-28 LAB — APTT: aPTT: 32 seconds (ref 24–37)

## 2011-09-28 NOTE — Patient Instructions (Addendum)
[name]  09/28/2011   Your procedure is scheduled on:  10-02-2011  Report to [facility] at  0515 AM.  Call this number if you have problems the morning of surgery: [phone]   Remember:   Do not eat food or drink liquids:After Midnight.  .  Take these medicines the morning of surgery with A SIP OF WATER: metoprolol tartrate, prednisone   Do not wear jewelry or make up.  Do not wear lotions, powders, or perfumes.Do not wear deodorant.    Do not bring valuables to the hospital.  Contacts, dentures or bridgework may not be worn into surgery.  Leave suitcase in the car. After surgery it may be brought to your room.  For patients admitted to the hospital, checkout time is 11:00 AM the day of    discharge                             Special Instructions: CHG Shower Use Special Wash: 1/2 bottle night before surgery and 1/2 bottle morning of surgery, use regular soap on face and front and back private area.   Please read over the following fact sheets that you were given: MRSA Information, blood fact sheet, incentive spirometer fact sheet  Cain Sieve [facility] pre op nurse phone number [phone], call if needed

## 2011-09-28 NOTE — Pre-Procedure Instructions (Signed)
ekg 09-07-2011 dr Ronne Binning on chart Chest 2 view xray 06-11-2011 epic Medical clearance note dr Ronne Binning on chart

## 2011-10-01 ENCOUNTER — Other Ambulatory Visit: Payer: Self-pay | Admitting: Orthopedic Surgery

## 2011-10-01 NOTE — H&P (Signed)
Tom Ortiz  DOB: 09/21/1927 Married / Language: English / Race: White / Male  Date of Admission: 10/02/2011  Chief Complaint:  Right hip pain  History of Present Illness The patient is a 78 year old male who comes in for a preoperative History and Physical. The patient is scheduled for a right right hip femoral revision versus total hip revision to be performed by Dr. Frank V. Aluisio, MD at [facility] on 10/02/2011. The patient is a 78 year old male who presents for a recheck of hip. The patient states that he is getting worse (increased pain in the right hip) at this time. The pain is under poor control at this time and describes their pain as moderate to severe. They are currently on no medication for their pain. The patient feels that they are progressing poorly at this time. Tom Ortiz comes in today with right hip pain. He dislocated his left total hip in early january. After the reduction, he reports that his left hip has been doing well. He has seen Dr. Aluisio previously for his right hip pain. He has his right hip replaced about 7 years ago by Dr. Duda, follwed by an acetabular revision in 2009 after he suffered from multiple dislocations. He was scheduled for surgery in October to have the right hip revised once again after x-rays and bone scan should that he has a loose femoral component. The patient cancelled the surgery because he was suffering from depression and did not feel mentally stable enough to have surgery. He reports that since last fall he has had an increase in pain in the right hip. He has severe pain with weightbearing as well as motion of the right hip even when nonweightbearing. He states that the pain is in the groin and over the anterior right thigh. He reports that he has not been taking anything for pain. He currently ambulates with a walker. He reports that at this time he is primarily lying around the house as any movement causes extreme pain. He comes in  with his wife and son in preparation of the upcoming surgery. He has been treated conservatively in the past for the above stated problem and despite conservative measures, they continue to have progressive pain and severe functional limitations and dysfunction. They have failed non-operative management. It is felt that they would benefit from undergoing revision total joint replacement. Risks and benefits of the procedure have been discussed with the patient and they elect to proceed with surgery. There are no active contraindications to surgery such as ongoing infection or rapidly progressive neurological disease.  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 06/23/2010 Depression Anxiety Disorder Rheumatoid Arthritis Osteoarthritis Tinnitus Kidney Stone  Allergies QuiNINE Sulfate *Antimalarials**. Rash.   Family History Osteoarthritis. father Hypertension. mother Rheumatoid Arthritis. father Heart Disease. father Father. Coronary artery disease. Depression. mother Mother. Cerebrovascular Accident.   Social History Drug/Alcohol Rehab (Previously). no Living situation. live with spouse Illicit drug use. no Children. 2 Alcohol use. former drinker Drug/Alcohol Rehab (Currently). no Current work status. retired Number of flights of stairs before winded. less than 1 Tobacco use. Former smoker. former smoker; smoke(d) 1/2 pack(s) per day Tobacco / smoke exposure. no Pain Contract. no No alcohol use Marital status. Married. Post-Surgical Plans. Plan is to go to [facility].   Medication History PredniSONE ( Oral) Specific dose unknown - Active. Losartan   Potassium-HCTZ ( Oral) Specific dose unknown - Active. LORazepam (0.5MG Tablet, Oral) Active. Mirtazapine (30MG  Tablet, Oral) Active. Metoprolol Tartrate (25MG Tablet, Oral) Active.   Past Surgical History Total Hip Replacement. bilateral Tonsillectomy. Date: 1940. Appendectomy. Date: 1955. Hernia Repair. Date: 1990.  Review of Systems General:Not Present- Chills, Fever, Night Sweats, Fatigue, Weight Gain, Weight Loss and Memory Loss. Skin:Not Present- Hives, Itching, Rash, Eczema and Lesions. HEENT:Not Present- Tinnitus, Headache, Double Vision, Visual Loss, Hearing Loss and Dentures. Respiratory:Not Present- Shortness of breath with exertion, Shortness of breath at rest, Allergies, Coughing up blood and Chronic Cough. Cardiovascular:Not Present- Chest Pain, Racing/skipping heartbeats, Difficulty Breathing Lying Down, Murmur, Swelling and Palpitations. Gastrointestinal:Not Present- Bloody Stool, Heartburn, Abdominal Pain, Vomiting, Nausea, Constipation, Diarrhea, Difficulty Swallowing, Jaundice and Loss of appetitie. Male Genitourinary:Not Present- Urinary frequency, Blood in Urine, Weak urinary stream, Discharge, Flank Pain, Incontinence, Painful Urination, Urgency, Urinary Retention and Urinating at Night. Musculoskeletal:Not Present- Muscle Weakness, Muscle Pain, Joint Swelling, Joint Pain, Back Pain, Morning Stiffness and Spasms. Neurological:Not Present- Tremor, Dizziness, Blackout spells, Paralysis, Difficulty with balance and Weakness. Psychiatric:Not Present- Insomnia.   Vitals Weight: 177 lb Height: 71 in Body Surface Area: 2.01 m Body Mass Index: 24.69 kg/m Pulse: 56 (Regular) Resp.: 12 (Unlabored) BP: 132/64 (Sitting, Left Arm, Standard)    Physical Exam The physical exam findings are as follows:  Patient is accompanied today by his wife and son on exam.   General Mental Status - Alert, cooperative and good historian. General Appearance- pleasant. Not in acute distress. Orientation- Oriented X3. Build & Nutrition- Well nourished and Well  developed.   Head and Neck Head- normocephalic, atraumatic . Neck Global Assessment- supple. no bruit auscultated on the right and no bruit auscultated on the left.   Eye Pupil- Bilateral- Regular and Round. Note: wears glasses Motion- Bilateral- EOMI.   Chest and Lung Exam Auscultation: Breath sounds:- clear at anterior chest wall and - clear at posterior chest wall. Adventitious sounds:- No Adventitious sounds.   Cardiovascular Auscultation:Rhythm- Regular rate and rhythm. Heart Sounds- S1 WNL and S2 WNL. Murmurs & Other Heart Sounds:Auscultation of the heart reveals - No Murmurs.   Abdomen Palpation/Percussion:Tenderness- Abdomen is non-tender to palpation. Rigidity (guarding)- Abdomen is soft. Auscultation:Auscultation of the abdomen reveals - Bowel sounds normal.   Male Genitourinary  Not done, not pertinent to present illness  Musculoskeletal  Alert and oriented. No acute distress. Left hip has painless ROM. Flexion 110, internal and external rotation 30, abduction 40 degrees. Severe pain with ROM of right hip. Knees have normal painless ROM. Sensation and circulation intact in the lower extremities.  Assessment & Plan Loosening of Prosthetic Right Total Hip  Patient is for a Revision Right Femoral Hip versus Total Hip by Dr. Aluisio.  The patient presented to the H&P appointment with his wife and son. I spent over an hour with them addressing their concerns and discussing the surgery, the risks and benefits associated with the procedure, the proposed therapy regimen, and possible medications used after surgery for postoperative pain and the potential side-effects.   Plan is to go to [facility] following surgery.  PCP - Dr. Brian MacKenzie - Patient has been seen preoperatively by Dr. MacKenzie and felt to be stable for the upcoming surgery. He was recently placed on Metoprolol for perioperative beta-blocker protection. He will stay on  the medication up until surgery, thru surgery, and into the postoperative period until discontinued by Dr. MacKenzie at a later point.  Signed electronically by DREW L PERKINS,   PA-C  

## 2011-10-02 ENCOUNTER — Encounter (HOSPITAL_COMMUNITY)
Admission: RE | Disposition: A | Discharge status: Skilled Nursing Facility | Payer: Self-pay | Source: Ambulatory Visit | Attending: Orthopedic Surgery

## 2011-10-02 ENCOUNTER — Ambulatory Visit (HOSPITAL_COMMUNITY): Payer: MEDICARE

## 2011-10-02 ENCOUNTER — Inpatient Hospital Stay (HOSPITAL_COMMUNITY)
Admission: RE | Admit: 2011-10-02 | Discharge: 2011-10-05 | DRG: 467 | Disposition: A | Discharge status: Skilled Nursing Facility | Payer: MEDICARE | Source: Ambulatory Visit | Attending: Orthopedic Surgery | Admitting: Orthopedic Surgery

## 2011-10-02 ENCOUNTER — Encounter (HOSPITAL_COMMUNITY): Payer: Self-pay | Admitting: *Deleted

## 2011-10-02 ENCOUNTER — Encounter (HOSPITAL_COMMUNITY): Payer: Self-pay | Admitting: Registered Nurse

## 2011-10-02 ENCOUNTER — Ambulatory Visit (HOSPITAL_COMMUNITY): Payer: MEDICARE | Admitting: Registered Nurse

## 2011-10-02 DIAGNOSIS — K219 Gastro-esophageal reflux disease without esophagitis: Secondary | ICD-10-CM | POA: Diagnosis present

## 2011-10-02 DIAGNOSIS — Y831 Surgical operation with implant of artificial internal device as the cause of abnormal reaction of the patient, or of later complication, without mention of misadventure at the time of the procedure: Secondary | ICD-10-CM | POA: Diagnosis present

## 2011-10-02 DIAGNOSIS — T84039A Mechanical loosening of unspecified internal prosthetic joint, initial encounter: Principal | ICD-10-CM | POA: Diagnosis present

## 2011-10-02 DIAGNOSIS — I10 Essential (primary) hypertension: Secondary | ICD-10-CM | POA: Diagnosis present

## 2011-10-02 DIAGNOSIS — T84018A Broken internal joint prosthesis, other site, initial encounter: Secondary | ICD-10-CM

## 2011-10-02 DIAGNOSIS — E871 Hypo-osmolality and hyponatremia: Secondary | ICD-10-CM

## 2011-10-02 DIAGNOSIS — Z96649 Presence of unspecified artificial hip joint: Secondary | ICD-10-CM

## 2011-10-02 HISTORY — PX: TOTAL HIP REVISION: SHX763

## 2011-10-02 LAB — TYPE AND SCREEN
ABO/RH(D): O POS
Antibody Screen: NEGATIVE

## 2011-10-02 SURGERY — TOTAL HIP REVISION
Anesthesia: Spinal | Site: Hip | Laterality: Right | Wound class: Clean

## 2011-10-02 MED ORDER — ONDANSETRON HCL 4 MG/2ML IJ SOLN
INTRAMUSCULAR | Status: DC | PRN
  Administered 2011-10-02: 4 mg via INTRAVENOUS

## 2011-10-02 MED ORDER — BUPIVACAINE LIPOSOME 1.3 % IJ SUSP
INTRAMUSCULAR | Status: DC | PRN
  Administered 2011-10-02: 20 mL

## 2011-10-02 MED ORDER — HYDROMORPHONE HCL PF 1 MG/ML IJ SOLN: 0.2500 mg | INTRAMUSCULAR | Status: DC | PRN

## 2011-10-02 MED ORDER — METOCLOPRAMIDE HCL 5 MG/ML IJ SOLN: 5.0000 mg | Freq: Three times a day (TID) | INTRAMUSCULAR | Status: DC | PRN

## 2011-10-02 MED ORDER — DEXTROSE 5 % IV SOLN
500.0000 mg | Freq: Four times a day (QID) | INTRAVENOUS | Status: DC | PRN
  Administered 2011-10-02 (×2): 500 mg via INTRAVENOUS
  Filled 2011-10-02 (×2): qty 5

## 2011-10-02 MED ORDER — MORPHINE SULFATE 2 MG/ML IJ SOLN: 1.0000 mg | INTRAMUSCULAR | Status: DC | PRN

## 2011-10-02 MED ORDER — METHOCARBAMOL 500 MG PO TABS
500.0000 mg | ORAL_TABLET | Freq: Four times a day (QID) | ORAL | Status: DC | PRN
  Administered 2011-10-03 – 2011-10-04 (×3): 500 mg via ORAL
  Filled 2011-10-02 (×3): qty 1

## 2011-10-02 MED ORDER — HYDROCHLOROTHIAZIDE 12.5 MG PO CAPS
12.5000 mg | ORAL_CAPSULE | Freq: Every day | ORAL | Status: DC
  Administered 2011-10-03 – 2011-10-05 (×3): 12.5 mg via ORAL
  Filled 2011-10-02 (×3): qty 1

## 2011-10-02 MED ORDER — ACETAMINOPHEN 650 MG RE SUPP: 650.0000 mg | Freq: Four times a day (QID) | RECTAL | Status: DC | PRN

## 2011-10-02 MED ORDER — RIVAROXABAN 10 MG PO TABS
10.0000 mg | ORAL_TABLET | Freq: Every day | ORAL | Status: DC
  Administered 2011-10-03 – 2011-10-05 (×3): 10 mg via ORAL
  Filled 2011-10-02 (×4): qty 1

## 2011-10-02 MED ORDER — CEFAZOLIN SODIUM 1-5 GM-% IV SOLN
1.0000 g | Freq: Four times a day (QID) | INTRAVENOUS | Status: AC
  Administered 2011-10-02 (×2): 1 g via INTRAVENOUS
  Filled 2011-10-02 (×2): qty 50

## 2011-10-02 MED ORDER — ONDANSETRON HCL 4 MG PO TABS
4.0000 mg | ORAL_TABLET | Freq: Four times a day (QID) | ORAL | Status: DC | PRN
  Administered 2011-10-05: 4 mg via ORAL
  Filled 2011-10-02: qty 1

## 2011-10-02 MED ORDER — LACTATED RINGERS IV SOLN: INTRAVENOUS | Status: DC

## 2011-10-02 MED ORDER — SODIUM CHLORIDE 0.9 % IV SOLN: INTRAVENOUS | Status: DC

## 2011-10-02 MED ORDER — EPHEDRINE SULFATE 50 MG/ML IJ SOLN
INTRAMUSCULAR | Status: DC | PRN
  Administered 2011-10-02 (×2): 5 mg via INTRAVENOUS

## 2011-10-02 MED ORDER — LACTATED RINGERS IV SOLN
INTRAVENOUS | Status: DC | PRN
  Administered 2011-10-02: 07:00:00 via INTRAVENOUS

## 2011-10-02 MED ORDER — DEXTROSE-NACL 5-0.9 % IV SOLN
INTRAVENOUS | Status: DC
  Administered 2011-10-02: 1000 mL via INTRAVENOUS
  Administered 2011-10-02 – 2011-10-03 (×2): via INTRAVENOUS

## 2011-10-02 MED ORDER — LORAZEPAM 0.5 MG PO TABS
0.5000 mg | ORAL_TABLET | Freq: Every day | ORAL | Status: DC
  Administered 2011-10-02 – 2011-10-04 (×3): 0.5 mg via ORAL
  Filled 2011-10-02 (×3): qty 1

## 2011-10-02 MED ORDER — ONDANSETRON HCL 4 MG/2ML IJ SOLN
4.0000 mg | Freq: Four times a day (QID) | INTRAMUSCULAR | Status: DC | PRN
  Administered 2011-10-05: 4 mg via INTRAVENOUS
  Filled 2011-10-02: qty 2

## 2011-10-02 MED ORDER — CEFAZOLIN SODIUM-DEXTROSE 2-3 GM-% IV SOLR
INTRAVENOUS | Status: AC
  Filled 2011-10-02: qty 50

## 2011-10-02 MED ORDER — LIDOCAINE HCL (CARDIAC) 20 MG/ML IV SOLN
INTRAVENOUS | Status: DC | PRN
  Administered 2011-10-02: 100 mg via INTRAVENOUS

## 2011-10-02 MED ORDER — MEPERIDINE HCL 50 MG/ML IJ SOLN: 6.2500 mg | INTRAMUSCULAR | Status: DC | PRN

## 2011-10-02 MED ORDER — DOCUSATE SODIUM 100 MG PO CAPS
100.0000 mg | ORAL_CAPSULE | Freq: Two times a day (BID) | ORAL | Status: DC
  Administered 2011-10-02 – 2011-10-05 (×6): 100 mg via ORAL

## 2011-10-02 MED ORDER — 0.9 % SODIUM CHLORIDE (POUR BTL) OPTIME
TOPICAL | Status: DC | PRN
  Administered 2011-10-02: 1000 mL

## 2011-10-02 MED ORDER — BUPIVACAINE HCL (PF) 0.5 % IJ SOLN
INTRAMUSCULAR | Status: AC
  Filled 2011-10-02: qty 30

## 2011-10-02 MED ORDER — FENTANYL CITRATE 0.05 MG/ML IJ SOLN
INTRAMUSCULAR | Status: DC | PRN
  Administered 2011-10-02: 50 ug via INTRAVENOUS

## 2011-10-02 MED ORDER — DIPHENHYDRAMINE HCL 12.5 MG/5ML PO ELIX
12.5000 mg | ORAL_SOLUTION | ORAL | Status: DC | PRN
  Administered 2011-10-02: 25 mg via ORAL
  Filled 2011-10-02: qty 10

## 2011-10-02 MED ORDER — METOCLOPRAMIDE HCL 10 MG PO TABS
5.0000 mg | ORAL_TABLET | Freq: Three times a day (TID) | ORAL | Status: DC | PRN
  Administered 2011-10-05: 10 mg via ORAL
  Filled 2011-10-02: qty 1

## 2011-10-02 MED ORDER — PHENYLEPHRINE HCL 10 MG/ML IJ SOLN
20.0000 mg | INTRAVENOUS | Status: DC | PRN
  Administered 2011-10-02: 20 ug/min via INTRAVENOUS

## 2011-10-02 MED ORDER — TRAMADOL HCL 50 MG PO TABS: 50.0000 mg | ORAL_TABLET | Freq: Four times a day (QID) | ORAL | Status: DC | PRN

## 2011-10-02 MED ORDER — OXYCODONE HCL 5 MG PO TABS
5.0000 mg | ORAL_TABLET | ORAL | Status: DC | PRN
  Administered 2011-10-02: 10 mg via ORAL
  Administered 2011-10-02 – 2011-10-03 (×2): 5 mg via ORAL
  Administered 2011-10-03 (×2): 10 mg via ORAL
  Administered 2011-10-04 (×2): 5 mg via ORAL
  Filled 2011-10-02: qty 2
  Filled 2011-10-02: qty 1
  Filled 2011-10-02: qty 2
  Filled 2011-10-02 (×3): qty 1
  Filled 2011-10-02: qty 2

## 2011-10-02 MED ORDER — PHENOL 1.4 % MT LIQD
1.0000 | OROMUCOSAL | Status: DC | PRN
  Filled 2011-10-02: qty 177

## 2011-10-02 MED ORDER — SODIUM CHLORIDE 0.9 % IJ SOLN
INTRAMUSCULAR | Status: DC | PRN
  Administered 2011-10-02: 50 mL via INTRAVENOUS

## 2011-10-02 MED ORDER — MIDAZOLAM HCL 5 MG/5ML IJ SOLN
INTRAMUSCULAR | Status: DC | PRN
  Administered 2011-10-02 (×2): 1 mg via INTRAVENOUS

## 2011-10-02 MED ORDER — PROMETHAZINE HCL 25 MG/ML IJ SOLN: 6.2500 mg | INTRAMUSCULAR | Status: DC | PRN

## 2011-10-02 MED ORDER — BUPIVACAINE HCL (PF) 0.5 % IJ SOLN
INTRAMUSCULAR | Status: DC | PRN
  Administered 2011-10-02: 3 mL

## 2011-10-02 MED ORDER — PREDNISONE 20 MG PO TABS
20.0000 mg | ORAL_TABLET | Freq: Two times a day (BID) | ORAL | Status: AC
  Administered 2011-10-03: 20 mg via ORAL
  Administered 2011-10-03: 10 mg via ORAL
  Filled 2011-10-02 (×3): qty 1

## 2011-10-02 MED ORDER — PHENYLEPHRINE HCL 10 MG/ML IJ SOLN
INTRAMUSCULAR | Status: DC | PRN
  Administered 2011-10-02 (×2): 80 ug via INTRAVENOUS

## 2011-10-02 MED ORDER — POLYETHYLENE GLYCOL 3350 17 G PO PACK: 17.0000 g | PACK | Freq: Every day | ORAL | Status: DC | PRN

## 2011-10-02 MED ORDER — LOSARTAN POTASSIUM-HCTZ 50-12.5 MG PO TABS: 1.0000 | ORAL_TABLET | Freq: Every day | ORAL | Status: DC

## 2011-10-02 MED ORDER — FLEET ENEMA 7-19 GM/118ML RE ENEM: 1.0000 | ENEMA | Freq: Once | RECTAL | Status: AC | PRN

## 2011-10-02 MED ORDER — ACETAMINOPHEN 10 MG/ML IV SOLN
1000.0000 mg | Freq: Four times a day (QID) | INTRAVENOUS | Status: AC
  Administered 2011-10-02 – 2011-10-03 (×4): 1000 mg via INTRAVENOUS
  Filled 2011-10-02 (×5): qty 100

## 2011-10-02 MED ORDER — MIRTAZAPINE 30 MG PO TABS
30.0000 mg | ORAL_TABLET | Freq: Every day | ORAL | Status: DC
  Administered 2011-10-02 – 2011-10-04 (×3): 30 mg via ORAL
  Filled 2011-10-02 (×4): qty 1

## 2011-10-02 MED ORDER — CEFAZOLIN SODIUM-DEXTROSE 2-3 GM-% IV SOLR
2.0000 g | INTRAVENOUS | Status: AC
  Administered 2011-10-02: 2 g via INTRAVENOUS

## 2011-10-02 MED ORDER — PREDNISONE 10 MG PO TABS
10.0000 mg | ORAL_TABLET | Freq: Two times a day (BID) | ORAL | Status: AC
  Administered 2011-10-03 – 2011-10-04 (×3): 10 mg via ORAL
  Filled 2011-10-02 (×2): qty 1

## 2011-10-02 MED ORDER — ACETAMINOPHEN 325 MG PO TABS: 650.0000 mg | ORAL_TABLET | Freq: Four times a day (QID) | ORAL | Status: DC | PRN

## 2011-10-02 MED ORDER — PROPOFOL 10 MG/ML IV EMUL
INTRAVENOUS | Status: DC | PRN
  Administered 2011-10-02: 50 ug/kg/min via INTRAVENOUS

## 2011-10-02 MED ORDER — PREDNISONE 20 MG PO TABS
20.0000 mg | ORAL_TABLET | Freq: Once | ORAL | Status: AC
  Administered 2011-10-02: 20 mg via ORAL
  Filled 2011-10-02: qty 1

## 2011-10-02 MED ORDER — LOSARTAN POTASSIUM 50 MG PO TABS
50.0000 mg | ORAL_TABLET | Freq: Every day | ORAL | Status: DC
  Administered 2011-10-03 – 2011-10-05 (×3): 50 mg via ORAL
  Filled 2011-10-02 (×3): qty 1

## 2011-10-02 MED ORDER — ACETAMINOPHEN 10 MG/ML IV SOLN
INTRAVENOUS | Status: AC
  Filled 2011-10-02: qty 100

## 2011-10-02 MED ORDER — PREDNISONE 5 MG PO TABS
5.0000 mg | ORAL_TABLET | Freq: Every day | ORAL | Status: DC
  Filled 2011-10-02: qty 1

## 2011-10-02 MED ORDER — GLYCOPYRROLATE 0.2 MG/ML IJ SOLN
INTRAMUSCULAR | Status: DC | PRN
  Administered 2011-10-02: 0.4 mg via INTRAVENOUS

## 2011-10-02 MED ORDER — METOPROLOL TARTRATE 25 MG PO TABS
25.0000 mg | ORAL_TABLET | Freq: Every day | ORAL | Status: DC
  Administered 2011-10-03 – 2011-10-05 (×3): 25 mg via ORAL
  Filled 2011-10-02 (×4): qty 1

## 2011-10-02 MED ORDER — ACETAMINOPHEN 10 MG/ML IV SOLN
1000.0000 mg | Freq: Once | INTRAVENOUS | Status: AC
  Administered 2011-10-02: 1000 mg via INTRAVENOUS
  Filled 2011-10-02: qty 100

## 2011-10-02 MED ORDER — MENTHOL 3 MG MT LOZG
1.0000 | LOZENGE | OROMUCOSAL | Status: DC | PRN
  Filled 2011-10-02: qty 9

## 2011-10-02 MED ORDER — BISACODYL 10 MG RE SUPP: 10.0000 mg | Freq: Every day | RECTAL | Status: DC | PRN

## 2011-10-02 MED ORDER — PREDNISONE 5 MG PO TABS
5.0000 mg | ORAL_TABLET | Freq: Two times a day (BID) | ORAL | Status: DC
  Administered 2011-10-04 – 2011-10-05 (×2): 5 mg via ORAL
  Filled 2011-10-02 (×3): qty 1

## 2011-10-02 MED ORDER — BUPIVACAINE LIPOSOME 1.3 % IJ SUSP
20.0000 mL | Freq: Once | INTRAMUSCULAR | Status: DC
  Filled 2011-10-02: qty 20

## 2011-10-02 SURGICAL SUPPLY — 59 items
BAG SPEC THK2 15X12 ZIP CLS (MISCELLANEOUS) ×3
BAG ZIPLOCK 12X15 (MISCELLANEOUS) ×6 IMPLANT
BIT DRILL 2.8X128 (BIT) ×2 IMPLANT
BLADE EXTENDED COATED 6.5IN (ELECTRODE) ×2 IMPLANT
BLADE SAW SAG 73X25 THK (BLADE) ×1
BLADE SAW SGTL 73X25 THK (BLADE) ×1 IMPLANT
CATH KIT ON-Q SILVERSOAK 5 (CATHETERS) ×1 IMPLANT
CATH KIT ON-Q SILVERSOAK 5IN (CATHETERS) ×2 IMPLANT
CLOTH BEACON ORANGE TIMEOUT ST (SAFETY) ×2 IMPLANT
CONT SPECI 4OZ STER CLIK (MISCELLANEOUS) IMPLANT
DRAPE INCISE IOBAN 66X45 STRL (DRAPES) ×2 IMPLANT
DRAPE ORTHO SPLIT 77X108 STRL (DRAPES) ×4
DRAPE POUCH INSTRU U-SHP 10X18 (DRAPES) ×2 IMPLANT
DRAPE SURG ORHT 6 SPLT 77X108 (DRAPES) ×2 IMPLANT
DRAPE U-SHAPE 47X51 STRL (DRAPES) ×2 IMPLANT
DRSG ADAPTIC 3X8 NADH LF (GAUZE/BANDAGES/DRESSINGS) ×1 IMPLANT
DRSG EMULSION OIL 3X16 NADH (GAUZE/BANDAGES/DRESSINGS) ×2 IMPLANT
DRSG MEPILEX BORDER 4X4 (GAUZE/BANDAGES/DRESSINGS) ×3 IMPLANT
DRSG MEPILEX BORDER 4X8 (GAUZE/BANDAGES/DRESSINGS) ×2 IMPLANT
DURAPREP 26ML APPLICATOR (WOUND CARE) ×2 IMPLANT
ELECT REM PT RETURN 9FT ADLT (ELECTROSURGICAL) ×2
ELECTRODE REM PT RTRN 9FT ADLT (ELECTROSURGICAL) ×1 IMPLANT
EVACUATOR 1/8 PVC DRAIN (DRAIN) ×2 IMPLANT
FACESHIELD LNG OPTICON STERILE (SAFETY) ×8 IMPLANT
FEM HEAD METAL/METAL 36PLUS6 (Orthopedic Implant) ×1 IMPLANT
GLOVE BIO SURGEON STRL SZ7.5 (GLOVE) ×2 IMPLANT
GLOVE BIO SURGEON STRL SZ8 (GLOVE) ×2 IMPLANT
GLOVE BIOGEL PI IND STRL 8 (GLOVE) ×2 IMPLANT
GLOVE BIOGEL PI INDICATOR 8 (GLOVE) ×2
GOWN STRL NON-REIN LRG LVL3 (GOWN DISPOSABLE) ×2 IMPLANT
GOWN STRL REIN XL XLG (GOWN DISPOSABLE) ×2 IMPLANT
IMMOBILIZER KNEE 20 (SOFTGOODS) ×2
IMMOBILIZER KNEE 20 THIGH 36 (SOFTGOODS) ×1 IMPLANT
KIT BASIN OR (CUSTOM PROCEDURE TRAY) ×2 IMPLANT
MANIFOLD NEPTUNE II (INSTRUMENTS) ×2 IMPLANT
NDL SAFETY ECLIPSE 18X1.5 (NEEDLE) ×2 IMPLANT
NEEDLE HYPO 18GX1.5 SHARP (NEEDLE) ×4
NS IRRIG 1000ML POUR BTL (IV SOLUTION) ×2 IMPLANT
PACK TOTAL JOINT (CUSTOM PROCEDURE TRAY) ×2 IMPLANT
PASSER SUT SWANSON 36MM LOOP (INSTRUMENTS) ×2 IMPLANT
POSITIONER SURGICAL ARM (MISCELLANEOUS) ×2 IMPLANT
SLEEVE FEM PROX 18D LRG (Hips) ×1 IMPLANT
SPONGE GAUZE 4X4 12PLY (GAUZE/BANDAGES/DRESSINGS) ×2 IMPLANT
SPONGE LAP 18X18 X RAY DECT (DISPOSABLE) ×2 IMPLANT
STAPLER VISISTAT 35W (STAPLE) ×2 IMPLANT
STEM LG SROM 36/8 18X13X215R (Hips) ×1 IMPLANT
SUCTION FRAZIER TIP 10 FR DISP (SUCTIONS) ×2 IMPLANT
SUT ETHIBOND NAB CT1 #1 30IN (SUTURE) ×4 IMPLANT
SUT VIC AB 1 CT1 27 (SUTURE) ×6
SUT VIC AB 1 CT1 27XBRD ANTBC (SUTURE) ×3 IMPLANT
SUT VIC AB 2-0 CT1 27 (SUTURE) ×6
SUT VIC AB 2-0 CT1 TAPERPNT 27 (SUTURE) ×3 IMPLANT
SUT VLOC 180 0 24IN GS25 (SUTURE) ×4 IMPLANT
SWAB COLLECTION DEVICE MRSA (MISCELLANEOUS) ×2 IMPLANT
SYR 50ML LL SCALE MARK (SYRINGE) ×1 IMPLANT
TOWEL OR 17X26 10 PK STRL BLUE (TOWEL DISPOSABLE) ×4 IMPLANT
TRAY FOLEY CATH 14FRSI W/METER (CATHETERS) ×2 IMPLANT
TUBE ANAEROBIC SPECIMEN COL (MISCELLANEOUS) IMPLANT
WATER STERILE IRR 1500ML POUR (IV SOLUTION) ×2 IMPLANT

## 2011-10-02 NOTE — Anesthesia Postprocedure Evaluation (Signed)
  Anesthesia Post-op Note  Patient: Tom Ortiz  Procedure(s) Performed: Procedure(s) (LRB): TOTAL HIP REVISION (Right)  Patient Location: PACU  Anesthesia Type: Spinal  Level of Consciousness: awake and alert   Airway and Oxygen Therapy: Patient Spontanous Breathing  Post-op Pain: mild  Post-op Assessment: Post-op Vital signs reviewed, Patient's Cardiovascular Status Stable, Respiratory Function Stable, Patent Airway and No signs of Nausea or vomiting  Post-op Vital Signs: stable  Complications: No apparent anesthesia complications

## 2011-10-02 NOTE — Progress Notes (Signed)
Utilization review completed. Referral made to social worker as plan is for skilled nursing at discharge.  Anticipate discharge Monday

## 2011-10-02 NOTE — Interval H&P Note (Signed)
History and Physical Interval Note:  10/02/2011 7:15 AM  Tom Ortiz  has presented today for surgery, with the diagnosis of loose right total hip  The various methods of treatment have been discussed with the patient and family. After consideration of risks, benefits and other options for treatment, the patient has consented to  Procedure(s) (LRB): TOTAL HIP REVISION (Right) as a surgical intervention .  The patient's history has been reviewed, patient examined, no change in status, stable for surgery.  I have reviewed the patient's chart and labs.  Questions were answered to the patient's satisfaction.     Loanne Drilling

## 2011-10-02 NOTE — Brief Op Note (Signed)
10/02/2011  9:02 AM  PATIENT:  Tom Ortiz  78 y.o. male  PRE-OPERATIVE DIAGNOSIS:  loose right total hip  POST-OPERATIVE DIAGNOSIS:  Loose right total hip  PROCEDURE:  Procedure(s) (LRB): RIGHT FEMORAL REVISION OF TOTAL HIP ARTHROPLASTY  SURGEON:  Surgeon(s) and Role:    * Loanne Drilling, MD - Primary  PHYSICIAN ASSISTANT:   ASSISTANTS: Tish Men   ANESTHESIA:   spinal  EBL:  Total I/O In: 1000 [I.V.:1000] Out: 350 [Urine:75; Blood:275]  BLOOD ADMINISTERED:none  DRAINS: (medium) Hemovact drain(s) in the right hip with  Suction Open   LOCAL MEDICATIONS USED:  OTHER Exparel  COUNTS:  YES  DICTATION: .Other Dictation: Dictation Number [id number]  PLAN OF CARE: Admit to inpatient   PATIENT DISPOSITION:  PACU - hemodynamically stable.  Gus Rankin Aluisio, MD    10/02/2011, 9:09 AM

## 2011-10-02 NOTE — Anesthesia Procedure Notes (Signed)
Spinal  Patient location during procedure: OR Staffing Anesthesiologist: Phillips Grout Performed by: anesthesiologist  Preanesthetic Checklist Completed: patient identified, site marked, surgical consent, pre-op evaluation, timeout performed, IV checked, risks and benefits discussed and monitors and equipment checked Spinal Block Patient position: sitting Prep: Betadine Patient monitoring: heart rate, continuous pulse ox and blood pressure Location: L2-3 Injection technique: single-shot Needle Needle type: Spinocan  Needle gauge: 22 G Needle length: 9 cm Additional Notes Expiration date of kit checked and confirmed. Patient tolerated procedure well, without complications.

## 2011-10-02 NOTE — Anesthesia Preprocedure Evaluation (Addendum)
Anesthesia Evaluation  Patient identified by MRN, date of birth, ID band Patient awake    Reviewed: Allergy & Precautions, H&P , NPO status , Patient's Chart, lab work & pertinent test results  History of Anesthesia Complications (+) AWARENESS UNDER ANESTHESIA  Airway Mallampati: II TM Distance: >3 FB Neck ROM: Full    Dental No notable dental hx.    Pulmonary neg pulmonary ROS,  breath sounds clear to auscultation  Pulmonary exam normal       Cardiovascular hypertension, Pt. on medications Rhythm:Regular Rate:Normal     Neuro/Psych negative neurological ROS  negative psych ROS   GI/Hepatic Neg liver ROS, GERD-  Medicated,  Endo/Other  negative endocrine ROS  Renal/GU negative Renal ROS  negative genitourinary   Musculoskeletal negative musculoskeletal ROS (+)   Abdominal   Peds negative pediatric ROS (+)  Hematology negative hematology ROS (+)   Anesthesia Other Findings   Reproductive/Obstetrics negative OB ROS                          Anesthesia Physical  Anesthesia Plan  ASA: II  Anesthesia Plan: Spinal   Post-op Pain Management:    Induction:   Airway Management Planned: Simple Face Mask  Additional Equipment:   Intra-op Plan:   Post-operative Plan:   Informed Consent: I have reviewed the patients History and Physical, chart, labs and discussed the procedure including the risks, benefits and alternatives for the proposed anesthesia with the patient or authorized representative who has indicated his/her understanding and acceptance.   Dental advisory given  Plan Discussed with: CRNA  Anesthesia Plan Comments:        Anesthesia Quick Evaluation

## 2011-10-02 NOTE — Preoperative (Signed)
Beta Blockers   Reason not to administer Beta Blockers:Metoprolol talken 10-02-11 at 0445 per pt

## 2011-10-02 NOTE — Transfer of Care (Signed)
Immediate Anesthesia Transfer of Care Note  Patient: Tom Ortiz  Procedure(s) Performed: Procedure(s) (LRB): TOTAL HIP REVISION (Right)  Patient Location: PACU  Anesthesia Type: MAC and Spinal  Level of Consciousness: awake, alert , oriented and patient cooperative  Airway & Oxygen Therapy: Patient Spontanous Breathing and Patient connected to face mask oxygen  Post-op Assessment: Report given to PACU RN and Post -op Vital signs reviewed and stable  Post vital signs: Reviewed and stable  Complications: No apparent anesthesia complications

## 2011-10-02 NOTE — Op Note (Signed)
Tom Ortiz, Tom Ortiz NO.:  [id number]  MEDICAL RECORD NO.:  [id number]  LOCATION:  [facility]                         FACILITY:  [facility]  PHYSICIAN:  Ollen Gross, M.D.    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  PROCEDURE:  Right femoral revision of total hip arthroplasty.  SURGEON:  Ollen Gross, MD  ASSISTANT:  Avel Peace, PAC.  ANESTHESIA:  Spinal.  ESTIMATED BLOOD LOSS:  300 mL.  DRAINS:  Hemovac x1.  COMPLICATIONS:  None.  CONDITION:  Stable to recovery.  BRIEF CLINICAL NOTE:  Mr. Tom Ortiz is an 78 year old male who has had a complex history in regard to his hip replacement.  He has had hip replacement on the right with several dislocations necessitating an acetabular revision several years ago.  He presented to me last year with severe right thigh pain.  Exam and history suggested loosening of his femoral component, which was suggestive on the plain x-rays confirmed by the bone scan.  He presents now for femoral versus total hip arthroplasty revision.  PROCEDURE IN DETAIL:  After successful administration of spinal anesthetic, the patient is placed on the operating table in the left lateral decubitus position with the right side up and held with the hip positioner.  His right lower extremity was isolated from his perineum with plastic drapes and prepped and draped in the usual sterile fashion. Previous posterolateral incisions were utilized.  Skin cut with a 10 blade through the subcutaneous tissue to the level of the fascia lata, which was incised in line with the skin incision.  The sciatic nerve was palpated and protected, and the posterior pseudocapsule excised off  of the femur.  Hip was identified and was dislocated.  Femoral head is removed from the trunnion of the femoral prosthesis.  There is some motion present in the femoral prosthesis consistent with a loose femoral component.  This was consistent with his clinical presentation also.  We subsequently isolated the soft tissue off of the femoral neck in order to expose the stem.  At this point, I was able to basically wiggle the stem within the femoral canal.  The loop extraction device was placed around the femoral neck and the stem easily removed without any bone loss.  I placed a canal finder down into the femoral canal and the pedestal was present distally.  We drilled through the pedestal and then placed the long canal finder to confirm that we remained within the femoral canal.  Canal was then thoroughly irrigated with saline solution to remove the fatty elements.  We then began flexible reaming starting at 10 mm coursing in increments of 0.5 mm up to 14.5 mm, which allowed for placement of a long 13 mm stem.  The reaming is performed and then we did a proximal reaming for the S-ROM.  We went up to 18 D, and the sleeve was machined to a 18 D large trial sleeve was placed.  I then inspected the acetabular component which is in good position, well fixed and the acetabular liner was not worn at all.  I thus decided to leave that intact.  We then placed a trial femoral stem which was an 18 x 13 right long stem with a 36+8 neck.  It went in and went to about 20 degrees of anteversion which I felt was ideal.  36+0 head was placed. It reduces easily went to 36+6 head which had excellent tension on reduction.  A great stability with full extension and full external rotation, 70 degrees flexion, 40 degrees adduction, 90 degrees internal rotation, 90 degrees of flexion, 70 degrees of internal rotation.  By placing the right leg on top of the left, I felt the leg lengths were equal.  The hip was  then dislocated and the trials are removed.  The permanent 18 D large sleeve was placed into proximal femur and then 18 x 13 right long stem with 36+8 neck was placed and ended up being about 20 degrees of anteversion.  The permanent 36+6 head was placed and the hip was reduced same stability parameters.  We used a metal head.  The wound was then copiously irrigated with saline solution.  X-ray was taken AP and lateral, confirmed that the stem was within the femoral canal with no perforation of the femur.  The wound was further irrigated and short rotators and capsule reattached to the femur through drill holes. Fascia lata was closed over Hemovac drain with a running #1 V-Loc suture.  Subcu was closed with 2-0 Vicryl in 2 layers, and skin closed with staples.  Prior to closure of the deep tissues, I infiltrated the posterior capsule.  The gluteal muscles, fascia lata and subcu tissues with a total of 20 mL of Exparel mixed in 50 mL of saline.  Once the incision was cleaned and dried, then bulky sterile dressing was applied and he was placed into a knee immobilizer awakened and transported to recovery in stable condition.     Ollen Gross, M.D.     FA/MEDQ  D:  10/02/2011  T:  10/02/2011  Job:  [id number]

## 2011-10-02 NOTE — H&P (View-Only) (Signed)
Tom Ortiz  DOB: [birth year] Married / Language: English / Race: White / Male  Date of Admission: 10/02/2011  Chief Complaint:  Right hip pain  History of Present Illness The patient is a 78 year old male who comes in for a preoperative History and Physical. The patient is scheduled for a right right hip femoral revision versus total hip revision to be performed by Dr. Gus Rankin. Aluisio, MD at [facility] on 10/02/2011. The patient is a 78 year old male who presents for a recheck of hip. The patient states that he is getting worse (increased pain in the right hip) at this time. The pain is under poor control at this time and describes their pain as moderate to severe. They are currently on no medication for their pain. The patient feels that they are progressing poorly at this time. Tom Ortiz comes in today with right hip pain. He dislocated his left total hip in early january. After the reduction, he reports that his left hip has been doing well. He has seen Dr. Lequita Halt previously for his right hip pain. He has his right hip replaced about 7 years ago by Dr. Lajoyce Corners, follwed by an acetabular revision in 2009 after he suffered from multiple dislocations. He was scheduled for surgery in October to have the right hip revised once again after x-rays and bone scan should that he has a loose femoral component. The patient cancelled the surgery because he was suffering from depression and did not feel mentally stable enough to have surgery. He reports that since last fall he has had an increase in pain in the right hip. He has severe pain with weightbearing as well as motion of the right hip even when nonweightbearing. He states that the pain is in the groin and over the anterior right thigh. He reports that he has not been taking anything for pain. He currently ambulates with a walker. He reports that at this time he is primarily lying around the house as any movement causes extreme pain. He comes in  with his wife and son in preparation of the upcoming surgery. He has been treated conservatively in the past for the above stated problem and despite conservative measures, they continue to have progressive pain and severe functional limitations and dysfunction. They have failed non-operative management. It is felt that they would benefit from undergoing revision total joint replacement. Risks and benefits of the procedure have been discussed with the patient and they elect to proceed with surgery. There are no active contraindications to surgery such as ongoing infection or rapidly progressive neurological disease.  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 06/23/2010 Depression Anxiety Disorder Rheumatoid Arthritis Osteoarthritis Tinnitus Kidney Stone  Allergies QuiNINE Sulfate *Antimalarials**. Rash.   Family History Osteoarthritis. father Hypertension. mother Rheumatoid Arthritis. father Heart Disease. father Father. Coronary artery disease. Depression. mother Mother. Cerebrovascular Accident.   Social History Drug/Alcohol Rehab (Previously). no Living situation. live with spouse Illicit drug use. no Children. 2 Alcohol use. former drinker Drug/Alcohol Rehab (Currently). no Current work status. retired Number of flights of stairs before winded. less than 1 Tobacco use. Former smoker. former smoker; smoke(d) 1/2 pack(s) per day Tobacco / smoke exposure. no Pain Contract. no No alcohol use Marital status. Married. Post-Surgical Plans. Plan is to go to [facility].   Medication History PredniSONE ( Oral) Specific dose unknown - Active. Losartan  Potassium-HCTZ ( Oral) Specific dose unknown - Active. LORazepam (0.5MG  Tablet, Oral) Active. Mirtazapine (30MG   Tablet, Oral) Active. Metoprolol Tartrate (25MG  Tablet, Oral) Active.   Past Surgical History Total Hip Replacement. bilateral Tonsillectomy. Date: 97. Appendectomy. Date: 31. Hernia Repair. Date: 40.  Review of Systems General:Not Present- Chills, Fever, Night Sweats, Fatigue, Weight Gain, Weight Loss and Memory Loss. Skin:Not Present- Hives, Itching, Rash, Eczema and Lesions. HEENT:Not Present- Tinnitus, Headache, Double Vision, Visual Loss, Hearing Loss and Dentures. Respiratory:Not Present- Shortness of breath with exertion, Shortness of breath at rest, Allergies, Coughing up blood and Chronic Cough. Cardiovascular:Not Present- Chest Pain, Racing/skipping heartbeats, Difficulty Breathing Lying Down, Murmur, Swelling and Palpitations. Gastrointestinal:Not Present- Bloody Stool, Heartburn, Abdominal Pain, Vomiting, Nausea, Constipation, Diarrhea, Difficulty Swallowing, Jaundice and Loss of appetitie. Male Genitourinary:Not Present- Urinary frequency, Blood in Urine, Weak urinary stream, Discharge, Flank Pain, Incontinence, Painful Urination, Urgency, Urinary Retention and Urinating at Night. Musculoskeletal:Not Present- Muscle Weakness, Muscle Pain, Joint Swelling, Joint Pain, Back Pain, Morning Stiffness and Spasms. Neurological:Not Present- Tremor, Dizziness, Blackout spells, Paralysis, Difficulty with balance and Weakness. Psychiatric:Not Present- Insomnia.   Vitals Weight: 177 lb Height: 71 in Body Surface Area: 2.01 m Body Mass Index: 24.69 kg/m Pulse: 56 (Regular) Resp.: 12 (Unlabored) BP: 132/64 (Sitting, Left Arm, Standard)    Physical Exam The physical exam findings are as follows:  Patient is accompanied today by his wife and son on exam.   General Mental Status - Alert, cooperative and good historian. General Appearance- pleasant. Not in acute distress. Orientation- Oriented X3. Build & Nutrition- Well nourished and Well  developed.   Head and Neck Head- normocephalic, atraumatic . Neck Global Assessment- supple. no bruit auscultated on the right and no bruit auscultated on the left.   Eye Pupil- Bilateral- Regular and Round. Note: wears glasses Motion- Bilateral- EOMI.   Chest and Lung Exam Auscultation: Breath sounds:- clear at anterior chest wall and - clear at posterior chest wall. Adventitious sounds:- No Adventitious sounds.   Cardiovascular Auscultation:Rhythm- Regular rate and rhythm. Heart Sounds- S1 WNL and S2 WNL. Murmurs & Other Heart Sounds:Auscultation of the heart reveals - No Murmurs.   Abdomen Palpation/Percussion:Tenderness- Abdomen is non-tender to palpation. Rigidity (guarding)- Abdomen is soft. Auscultation:Auscultation of the abdomen reveals - Bowel sounds normal.   Male Genitourinary  Not done, not pertinent to present illness  Musculoskeletal  Alert and oriented. No acute distress. Left hip has painless ROM. Flexion 110, internal and external rotation 30, abduction 40 degrees. Severe pain with ROM of right hip. Knees have normal painless ROM. Sensation and circulation intact in the lower extremities.  Assessment & Plan Loosening of Prosthetic Right Total Hip  Patient is for a Revision Right Femoral Hip versus Total Hip by Dr. Lequita Halt.  The patient presented to the H&P appointment with his wife and son. I spent over an hour with them addressing their concerns and discussing the surgery, the risks and benefits associated with the procedure, the proposed therapy regimen, and possible medications used after surgery for postoperative pain and the potential side-effects.   Plan is to go to [facility] following surgery.  PCP - Dr. Thayer Headings - Patient has been seen preoperatively by Dr. Thea Silversmith and felt to be stable for the upcoming surgery. He was recently placed on Metoprolol for perioperative beta-blocker protection. He will stay on  the medication up until surgery, thru surgery, and into the postoperative period until discontinued by Dr. Thea Silversmith at a later point.  Signed electronically by Roberts Gaudy,  PA-C

## 2011-10-03 LAB — CBC
HCT: 33.6 % — ABNORMAL LOW (ref 39.0–52.0)
Hemoglobin: 11.3 g/dL — ABNORMAL LOW (ref 13.0–17.0)
MCH: 31.4 pg (ref 26.0–34.0)
MCHC: 33.6 g/dL (ref 30.0–36.0)
MCV: 93.3 fL (ref 78.0–100.0)
Platelets: 192 10*3/uL (ref 150–400)
RBC: 3.6 MIL/uL — ABNORMAL LOW (ref 4.22–5.81)
RDW: 14.1 % (ref 11.5–15.5)
WBC: 8.1 10*3/uL (ref 4.0–10.5)

## 2011-10-03 LAB — BASIC METABOLIC PANEL
BUN: 14 mg/dL (ref 6–23)
CO2: 26 mEq/L (ref 19–32)
Calcium: 8.2 mg/dL — ABNORMAL LOW (ref 8.4–10.5)
Chloride: 101 mEq/L (ref 96–112)
Creatinine, Ser: 1 mg/dL (ref 0.50–1.35)
GFR calc Af Amer: 79 mL/min — ABNORMAL LOW (ref >90)
GFR calc non Af Amer: 68 mL/min — ABNORMAL LOW (ref >90)
Glucose, Bld: 154 mg/dL — ABNORMAL HIGH (ref 70–99)
Potassium: 4.5 mEq/L (ref 3.5–5.1)
Sodium: 133 mEq/L — ABNORMAL LOW (ref 135–145)

## 2011-10-03 NOTE — Evaluation (Signed)
Physical Therapy Evaluation Patient Details Name: Tom Ortiz MRN: [id number] DOB: Jun 12, 1927 Today's Date: 10/03/2011 Time: 1005-     PT Assessment / Plan / Recommendation Clinical Impression  pt is s/p right THP revision    PT Assessment  Patient needs continued PT services    Follow Up Recommendations  Skilled nursing facility    Barriers to Discharge        Equipment Recommendations  Defer to next venue    Recommendations for Other Services     Frequency 7X/week    Precautions / Restrictions Precautions Precautions: Posterior Hip Precaution Comments: no orders to D/C KI; pt with hx of multiple dislocations; pt unable to recall THP(0/3) Required Braces or Orthoses: Knee Immobilizer - Right Restrictions Weight Bearing Restrictions: Yes RLE Weight Bearing: Partial weight bearing RLE Partial Weight Bearing Percentage or Pounds: 25-50%   Pertinent Vitals/Pain       Mobility  Bed Mobility Bed Mobility: Supine to Sit Supine to Sit: 4: Min assist Details for Bed Mobility Assistance: verbal cues for technique and THP Transfers Transfers: Sit to Stand;Stand to Sit Sit to Stand: 4: Min assist Stand to Sit: 4: Min assist Details for Transfer Assistance: cues for hand placement and THP Ambulation/Gait Ambulation/Gait Assistance: 4: Min Environmental consultant (Feet): 45 Feet Assistive device: Rolling walker Ambulation/Gait Assistance Details: cues for sequence,  PWB, safety Gait Pattern: Step-to pattern    Exercises Total Joint Exercises Ankle Circles/Pumps: AROM;10 reps   PT Diagnosis: Difficulty walking  PT Problem List: Decreased strength;Decreased range of motion;Decreased activity tolerance;Decreased balance;Decreased mobility;Decreased knowledge of use of DME;Decreased knowledge of precautions PT Treatment Interventions: DME instruction;Gait training;Functional mobility training;Therapeutic activities;Therapeutic exercise;Balance training;Patient/family  education   PT Goals Acute Rehab PT Goals PT Goal Formulation: With patient Time For Goal Achievement: 10/10/11 Potential to Achieve Goals: Good Pt will go Supine/Side to Sit: with supervision PT Goal: Supine/Side to Sit - Progress: Goal set today Pt will go Sit to Stand: with supervision PT Goal: Sit to Stand - Progress: Goal set today Pt will Ambulate: 51 - 150 feet;with supervision;with rolling walker PT Goal: Ambulate - Progress: Goal set today Pt will Perform Home Exercise Program: with min assist PT Goal: Perform Home Exercise Program - Progress: Goal set today  Visit Information  Last PT Received On: 10/03/11 Assistance Needed: +1    Subjective Data  Subjective: not much pain Patient Stated Goal: home   Prior Functioning  Home Living Lives With: Spouse Available Help at Discharge: Family;Skilled Nursing Facility (pt planning to go to rehab at [facility])    Cognition  Overall Cognitive Status: Appears within functional limits for tasks assessed/performed Arousal/Alertness: Awake/alert Orientation Level: Appears intact for tasks assessed Behavior During Session: [facility] for tasks performed    Extremity/Trunk Assessment Right Upper Extremity Assessment RUE ROM/Strength/Tone: [facility] for tasks assessed Left Upper Extremity Assessment LUE ROM/Strength/Tone: [facility] for tasks assessed Right Lower Extremity Assessment RLE ROM/Strength/Tone: Deficits;Unable to fully assess;Due to pain RLE ROM/Strength/Tone Deficits: forefoot inversion, full ankle AROM, df/pf grossly 4/5 Left Lower Extremity Assessment LLE ROM/Strength/Tone: WFL for tasks assessed   Balance    End of Session PT - End of Session Equipment Utilized During Treatment: Right knee immobilizer Activity Tolerance: Patient tolerated treatment well Patient left: in chair Nurse Communication: Mobility status  GP     [facility] 10/03/2011, 10:56 AM

## 2011-10-03 NOTE — Progress Notes (Signed)
10/03/11 1500  PT Visit Information  Last PT Received On 10/03/11  Assistance Needed +1  PT Time Calculation  PT Start Time 1511  PT Stop Time 1537  PT Time Calculation (min) 26 min  Subjective Data  Subjective a little stiff  Precautions  Precautions Posterior Hip;Fall  Precaution Comments no orders to D/C KI; pt with hx of multiple dislocations; pt unable to recall THP(0/3)  Required Braces or Orthoses Knee Immobilizer - Right  Restrictions  RLE Weight Bearing PWB  RLE Partial Weight Bearing Percentage or Pounds 25-50%  Cognition  Overall Cognitive Status Appears within functional limits for tasks assessed/performed  Arousal/Alertness Awake/alert  Orientation Level Appears intact for tasks assessed  Behavior During Session [facility] for tasks performed  Bed Mobility  Bed Mobility Sit to Supine  Sit to Supine 4: Min assist  Details for Bed Mobility Assistance verbal cues for technique and THP  Transfers  Transfers Sit to Stand;Stand to Sit  Sit to Stand 4: Min assist  Stand to Sit 4: Min assist  Details for Transfer Assistance cues for hand placement and THP  Ambulation/Gait  Ambulation/Gait Assistance 4: Min assist  Ambulation Distance (Feet) 75 Feet  Assistive device Rolling walker  Ambulation/Gait Assistance Details cues for sequence, PWB, safety  Gait Pattern Step-to pattern  PT - End of Session  Equipment Utilized During Treatment Right knee immobilizer  Activity Tolerance Patient tolerated treatment well  Patient left in bed  PT - Assessment/Plan  Comments on Treatment Session pt progressing well  PT Plan Discharge plan remains appropriate;Frequency remains appropriate  PT Frequency 7X/week  Follow Up Recommendations Skilled nursing facility  Equipment Recommended Defer to next venue  Acute Rehab PT Goals  Time For Goal Achievement 10/10/11  Potential to Achieve Goals Good  Pt will go Supine/Side to Sit with supervision  PT Goal: Supine/Side to Sit - Progress  Progressing toward goal  Pt will go Sit to Stand with supervision  PT Goal: Sit to Stand - Progress Progressing toward goal  Pt will Ambulate 51 - 150 feet;with supervision;with rolling walker  PT Goal: Ambulate - Progress Progressing toward goal  PT General Charges  $$ ACUTE PT VISIT 1 Procedure  PT Treatments  $Gait Training 23-37 mins

## 2011-10-03 NOTE — Progress Notes (Signed)
Clinical Social Work Department BRIEF PSYCHOSOCIAL ASSESSMENT 10/03/2011  Patient:  Tom Ortiz, Tom Ortiz     Account Number:  [id number]     Admit date:  10/02/2011  Clinical Social Worker:  Leron Croak, CLINICAL SOCIAL WORKER  Date/Time:  10/03/2011 12:00 M  Referred by:  Physician  Date Referred:  10/03/2011 Referred for  SNF Placement   Other Referral:   Interview type:  Patient Other interview type:    PSYCHOSOCIAL DATA Living Status:  WIFE Admitted from facility:   Level of care:   Primary support name:  Adriana Bennette Primary support relationship to patient:  SPOUSE Degree of support available:    CURRENT CONCERNS Current Concerns  Post-Acute Placement   Other Concerns:    SOCIAL WORK ASSESSMENT / PLAN CSW met with Pt to discuss s/c planning. Pt is agreeable to SNF placement and would prefer to go to [facility]. Pt does not want to go to [facility] or [facility].   Assessment/plan status:  Information/Referral to [facility] Other assessment/ plan:   Information/referral to community resources:   CSW provided Pt with list of SNF facilities in [facility].    PATIENT'S/FAMILY'S RESPONSE TO PLAN OF CARE: Pt is appreciative for assistance with placement.      Leron Croak, LCSWA [employer] [phone]

## 2011-10-03 NOTE — Progress Notes (Signed)
Orthopedics Progress Note  Subjective: Pt resting with only minimal pain to right hip today Happy with his progress thus far  Objective:  Filed Vitals:   10/03/11 0653  BP: 127/74  Pulse: 75  Temp: 97.9 F (36.6 C)  Resp: 16    General: Awake and alert  Musculoskeletal: right hip incision intact, drain pulled, nv intact distally Neurovascularly intact  Lab Results  Component Value Date   WBC 8.1 10/03/2011   HGB 11.3* 10/03/2011   HCT 33.6* 10/03/2011   MCV 93.3 10/03/2011   PLT 192 10/03/2011       Component Value Date/Time   NA 133* 10/03/2011 0456   K 4.5 10/03/2011 0456   CL 101 10/03/2011 0456   CO2 26 10/03/2011 0456   GLUCOSE 154* 10/03/2011 0456   BUN 14 10/03/2011 0456   CREATININE 1.00 10/03/2011 0456   CALCIUM 8.2* 10/03/2011 0456   GFRNONAA 68* 10/03/2011 0456   GFRAA 79* 10/03/2011 0456    Lab Results  Component Value Date   INR 1.03 09/28/2011   INR 1.06 03/25/2011   INR 1.4 02/08/2008    Assessment/Plan: POD #1 s/p Procedure(s):right TOTAL HIP REVISION  PT/OT D/c planning Pulmonary toilet  Viviann Spare R. Ranell Patrick, MD 10/03/2011 7:10 AM

## 2011-10-04 LAB — CBC
HCT: 32.2 % — ABNORMAL LOW (ref 39.0–52.0)
Hemoglobin: 10.7 g/dL — ABNORMAL LOW (ref 13.0–17.0)
MCH: 31.4 pg (ref 26.0–34.0)
MCHC: 33.2 g/dL (ref 30.0–36.0)
MCV: 94.4 fL (ref 78.0–100.0)
Platelets: 198 10*3/uL (ref 150–400)
RBC: 3.41 MIL/uL — ABNORMAL LOW (ref 4.22–5.81)
RDW: 14.2 % (ref 11.5–15.5)
WBC: 9.8 10*3/uL (ref 4.0–10.5)

## 2011-10-04 LAB — BASIC METABOLIC PANEL
BUN: 14 mg/dL (ref 6–23)
CO2: 25 mEq/L (ref 19–32)
Calcium: 8.1 mg/dL — ABNORMAL LOW (ref 8.4–10.5)
Chloride: 105 mEq/L (ref 96–112)
Creatinine, Ser: 0.98 mg/dL (ref 0.50–1.35)
GFR calc Af Amer: 86 mL/min — ABNORMAL LOW (ref >90)
GFR calc non Af Amer: 74 mL/min — ABNORMAL LOW (ref >90)
Glucose, Bld: 161 mg/dL — ABNORMAL HIGH (ref 70–99)
Potassium: 4.2 mEq/L (ref 3.5–5.1)
Sodium: 136 mEq/L (ref 135–145)

## 2011-10-04 NOTE — Progress Notes (Signed)
10/04/11 1442  PT Visit Information  Last PT Received On 10/04/11  Assistance Needed +1  PT Time Calculation  PT Start Time 1420  PT Stop Time 1442  PT Time Calculation (min) 22 min  Precautions  Precautions Posterior Hip;Fall  Precaution Comments no orders to D/C KI; pt with hx of multiple dislocations)  Required Braces or Orthoses Knee Immobilizer - Right  Restrictions  Weight Bearing Restrictions Yes  RLE Weight Bearing PWB  RLE Partial Weight Bearing Percentage or Pounds 25-50%  Cognition  Cognition - Other Comments pt recalls 2 of 3 THP; reviewed hip precautions with pt and wife  Total Joint Exercises  Ankle Circles/Pumps AROM;10 reps  Quad Sets AROM;Both;15 reps  Short Arc Quad AROM;Right;10 reps  Heel Slides AAROM;Right;10 reps  Hip ABduction/ADduction AAROM;Right;15 reps  PT - End of Session  Equipment Utilized During Treatment Right knee immobilizer  Activity Tolerance Patient tolerated treatment well  Patient left in chair;with call bell/phone within reach;with family/visitor present  PT - Assessment/Plan  Comments on Treatment Session pt progressing well  PT Plan Discharge plan remains appropriate;Frequency remains appropriate  PT Frequency 7X/week  Follow Up Recommendations Skilled nursing facility  Equipment Recommended Defer to next venue  Acute Rehab PT Goals  Time For Goal Achievement 10/10/11  Potential to Achieve Goals Good  Pt will Perform Home Exercise Program with min assist  PT Goal: Perform Home Exercise Program - Progress Progressing toward goal  PT General Charges  $$ ACUTE PT VISIT 1 Procedure  PT Treatments  $Therapeutic Exercise 8-22 mins

## 2011-10-04 NOTE — Progress Notes (Signed)
Subjective: Patient feels like he is doing well minimal amount of pain medicines at night for sleep   Objective: Vital signs in last 24 hours: Temp:  [97.4 F (36.3 C)-98.5 F (36.9 C)] 97.4 F (36.3 C) (07/21 0532) Pulse Rate:  [63-70] 70  (07/21 0532) Resp:  [16-20] 18  (07/21 0532) BP: (113-135)/(72-77) 128/73 mmHg (07/21 0532) SpO2:  [94 %-95 %] 95 % (07/21 0532)  Intake/Output from previous day: 07/20 0701 - 07/21 0700 In: 2661.3 [P.O.:720; I.V.:1941.3] Out: 1800 [Urine:1800] Intake/Output this shift:     Basename 10/04/11 0426 10/03/11 0456  HGB 10.7* 11.3*    Basename 10/04/11 0426 10/03/11 0456  WBC 9.8 8.1  RBC 3.41* 3.60*  HCT 32.2* 33.6*  PLT 198 192    Basename 10/04/11 0426 10/03/11 0456  NA 136 133*  K 4.2 4.5  CL 105 101  CO2 25 26  BUN 14 14  CREATININE 0.98 1.00  GLUCOSE 161* 154*  CALCIUM 8.1* 8.2*   No results found for this basename: LABPT:2,INR:2 in the last 72 hours  Patient's conscious alert appropriate appears to start appears to be in no distress. His right hip is well proximal posterior with staples there is no erythema no blisters no drainage thigh and calf are soft nontender his leg is neuromotor vascularly intact is a long-leg immobilizer  Assessment/Plan: Postop day #3 right hip revision doing very well  Plan at time continue with physical therapy total hip protocol she continued doing well probable discharge home on Monday   KEEN,ROBERT W 10/04/2011, 8:14 AM

## 2011-10-04 NOTE — Evaluation (Signed)
Occupational Therapy Evaluation Patient Details Name: Tom Ortiz MRN: [id number] DOB: 05-19-1927 Today's Date: 10/04/2011 Time: 4098-1191 OT Time Calculation (min): 16 min  OT Assessment / Plan / Recommendation Clinical Impression  This 78 y.o. male admitted for revision Rt. THA.  Pt. with history of multiple dislocations.  Pt. with poor awareness of THA precautions.  Agree with SNF level rehab at D/C.   Will defer OT to SNF    OT Assessment  All further OT needs can be met in the next venue of care    Follow Up Recommendations  Skilled nursing facility;Supervision/Assistance - 24 hour    Barriers to Discharge      Equipment Recommendations  Defer to next venue    Recommendations for Other Services    Frequency       Precautions / Restrictions Precautions Precautions: Posterior Hip;Fall Precaution Booklet Issued: Yes (comment) (handout posted in room) Precaution Comments: no orders to D/C KI; pt with hx of multiple dislocations) Required Braces or Orthoses: Knee Immobilizer - Right Restrictions Weight Bearing Restrictions: Yes RLE Weight Bearing: Partial weight bearing RLE Partial Weight Bearing Percentage or Pounds: 25-50%       ADL  Eating/Feeding: Simulated;Independent Where Assessed - Eating/Feeding: Chair Grooming: Performed;Wash/dry hands;Minimal assistance Where Assessed - Grooming: Supported standing Upper Body Bathing: Simulated;Supervision/safety Where Assessed - Upper Body Bathing: Supported sitting Lower Body Bathing: Simulated;Moderate assistance Where Assessed - Lower Body Bathing: Supported sit to stand Upper Body Dressing: Simulated;Supervision/safety Where Assessed - Upper Body Dressing: Unsupported sitting Lower Body Dressing: Simulated;Maximal assistance Where Assessed - Lower Body Dressing: Supported sit to stand Toilet Transfer: Performed;Moderate assistance Toilet Transfer Method: Sit to stand Toilet Transfer Equipment: Raised toilet seat  with arms (or 3-in-1 over toilet) Toileting - Clothing Manipulation and Hygiene: Performed;Moderate assistance Where Assessed - Toileting Clothing Manipulation and Hygiene: Standing Equipment Used: Rolling walker;Knee Immobilizer Transfers/Ambulation Related to ADLs: Pt. required mod A to transfer to 3-in-1 in order to maintain precautions - attempted to bend to far, and unable to correct with only verbal cues - required physical assistance.  Pt. requires mod verbal cues to maintain WBing precautions during ADLs ADL Comments: Pt. self distracts easily.  Pt. able to state THA precautions, but requires verbal and physical cues to adhere to them.  Also requires physical and verbal cues to maintain PWB    OT Diagnosis: Generalized weakness;Cognitive deficits;Acute pain  OT Problem List: Decreased strength;Decreased activity tolerance;Impaired balance (sitting and/or standing);Decreased cognition;Decreased safety awareness;Decreased knowledge of use of DME or AE;Decreased knowledge of precautions;Pain OT Treatment Interventions:     OT Goals    Visit Information  Last OT Received On: 10/04/11 Assistance Needed: +1    Subjective Data  Subjective: "It has a mind of it's own"  Re: Rt. hip turning in Patient Stated Goal: "To get this behind me"   Prior Functioning  Vision/Perception  Home Living Lives With: Spouse Available Help at Discharge: Family;Skilled Nursing Facility Prior Function Level of Independence: Needs assistance Needs Assistance: Dressing;Light Housekeeping;Meal Prep Dressing: Moderate Meal Prep: Moderate Light Housekeeping: Maximal Vocation: Retired Comments: Pt reports wife had to assist him with dressing PTA due to pain Communication Communication: No difficulties Dominant Hand: Right      Cognition  Overall Cognitive Status: Impaired Area of Impairment: Memory;Problem solving;Awareness of errors;Safety/judgement Arousal/Alertness: Awake/alert Orientation Level:  Appears intact for tasks assessed Behavior During Session: [facility] for tasks performed Memory: Decreased recall of precautions Safety/Judgement: Decreased awareness of safety precautions;Impulsive;Decreased awareness of need for assistance Awareness of  Errors: Assistance required to identify errors made;Assistance required to correct errors made    Extremity/Trunk Assessment Right Upper Extremity Assessment RUE ROM/Strength/Tone: [facility] for tasks assessed RUE Coordination: WFL - gross/fine motor Left Upper Extremity Assessment LUE ROM/Strength/Tone: Within functional levels LUE Coordination: WFL - gross/fine motor Trunk Assessment Trunk Assessment: Normal   Mobility Transfers Transfers: Sit to Stand;Stand to Sit Sit to Stand: 4: Min assist;With upper extremity assist;From chair/3-in-1 Stand to Sit: 3: Mod assist;To chair/3-in-1;With upper extremity assist Details for Transfer Assistance: assist to prevent pt. from bending past 90   Exercise    Balance    End of Session OT - End of Session Activity Tolerance: Patient tolerated treatment well Patient left: in chair;with call bell/phone within reach  GO     Conarpe, Wendi M 10/04/2011, 2:29 PM

## 2011-10-04 NOTE — Progress Notes (Signed)
Physical Therapy Treatment Patient Details Name: Tom Ortiz MRN: [id number] DOB: [birth year] Today's Date: 10/04/2011 Time: 1040-1057 PT Time Calculation (min): 17 min  PT Assessment / Plan / Recommendation Comments on Treatment Session  pt progressing well    Follow Up Recommendations  Skilled nursing facility    Barriers to Discharge        Equipment Recommendations  Defer to next venue    Recommendations for Other Services    Frequency 7X/week   Plan Discharge plan remains appropriate;Frequency remains appropriate    Precautions / Restrictions Precautions Precautions: Posterior Hip;Fall Precaution Comments: no orders to D/C KI; pt with hx of multiple dislocations; pt unable to recall THP(0/3) Required Braces or Orthoses: Knee Immobilizer - Right Restrictions Weight Bearing Restrictions: Yes RLE Weight Bearing: Partial weight bearing RLE Partial Weight Bearing Percentage or Pounds: 25-50%   Pertinent Vitals/Pain     Mobility  Bed Mobility Bed Mobility: Sit to Supine Supine to Sit: 4: Min assist Details for Bed Mobility Assistance: verbal cues for technique and THP Transfers Transfers: Sit to Stand;Stand to Sit Sit to Stand: 4: Min assist Stand to Sit: 4: Min assist Details for Transfer Assistance: cues for hand placement and THP Ambulation/Gait Ambulation/Gait Assistance: 4: Min assist;4: Min [employer] social research officer (Feet): 130 Feet Assistive device: Rolling walker Ambulation/Gait Assistance Details: cues for sequence, PWB, safety Gait Pattern: Step-to pattern    Exercises     PT Diagnosis:    PT Problem List:   PT Treatment Interventions:     PT Goals Acute Rehab PT Goals PT Goal Formulation: With patient Time For Goal Achievement: 10/10/11 Potential to Achieve Goals: Good Pt will go Supine/Side to Sit: with supervision PT Goal: Supine/Side to Sit - Progress: Progressing toward goal Pt will go Sit to Stand: with supervision PT Goal: Sit to  Stand - Progress: Progressing toward goal Pt will Ambulate: 51 - 150 feet;with supervision;with rolling walker PT Goal: Ambulate - Progress: Progressing toward goal  Visit Information  Last PT Received On: 10/04/11 Assistance Needed: +1    Subjective Data  Subjective: i woke up every hour but i slept Patient Stated Goal: home after rehab   Cognition  Overall Cognitive Status: Appears within functional limits for tasks assessed/performed Arousal/Alertness: Awake/alert Orientation Level: Appears intact for tasks assessed Behavior During Session: [facility] for tasks performed    Balance     End of Session PT - End of Session Equipment Utilized During Treatment: Right knee immobilizer Activity Tolerance: Patient tolerated treatment well Patient left: in chair;with call bell/phone within reach   GP     [facility] 10/04/2011, 10:59 AM

## 2011-10-05 ENCOUNTER — Encounter (HOSPITAL_COMMUNITY): Payer: Self-pay | Admitting: Orthopedic Surgery

## 2011-10-05 DIAGNOSIS — E871 Hypo-osmolality and hyponatremia: Secondary | ICD-10-CM

## 2011-10-05 LAB — CBC
HCT: 31.4 % — ABNORMAL LOW (ref 39.0–52.0)
Hemoglobin: 10.5 g/dL — ABNORMAL LOW (ref 13.0–17.0)
MCH: 31.4 pg (ref 26.0–34.0)
MCHC: 33.4 g/dL (ref 30.0–36.0)
MCV: 94 fL (ref 78.0–100.0)
Platelets: 203 10*3/uL (ref 150–400)
RBC: 3.34 MIL/uL — ABNORMAL LOW (ref 4.22–5.81)
RDW: 14.3 % (ref 11.5–15.5)
WBC: 8.8 10*3/uL (ref 4.0–10.5)

## 2011-10-05 MED ORDER — BISACODYL 10 MG RE SUPP: 10.0000 mg | Freq: Every day | RECTAL | Status: AC | PRN

## 2011-10-05 MED ORDER — POLYETHYLENE GLYCOL 3350 17 G PO PACK: 17.0000 g | PACK | Freq: Every day | ORAL | Status: AC | PRN

## 2011-10-05 MED ORDER — METHOCARBAMOL 500 MG PO TABS: 500.0000 mg | ORAL_TABLET | Freq: Four times a day (QID) | ORAL | Status: AC | PRN

## 2011-10-05 MED ORDER — DSS 100 MG PO CAPS: 100.0000 mg | ORAL_CAPSULE | Freq: Two times a day (BID) | ORAL | Status: AC

## 2011-10-05 MED ORDER — PREDNISONE 5 MG PO TABS: 5.0000 mg | ORAL_TABLET | Freq: Two times a day (BID) | ORAL | Status: AC

## 2011-10-05 MED ORDER — RIVAROXABAN 10 MG PO TABS: 10.0000 mg | ORAL_TABLET | Freq: Every day | ORAL | Status: DC

## 2011-10-05 MED ORDER — OXYCODONE HCL 5 MG PO TABS: 5.0000 mg | ORAL_TABLET | ORAL | Status: AC | PRN

## 2011-10-05 MED ORDER — PREDNISONE 5 MG PO TABS: 5.0000 mg | ORAL_TABLET | Freq: Every day | ORAL | Status: AC

## 2011-10-05 MED ORDER — ACETAMINOPHEN 325 MG PO TABS: 650.0000 mg | ORAL_TABLET | Freq: Four times a day (QID) | ORAL | Status: DC | PRN

## 2011-10-05 MED ORDER — ONDANSETRON HCL 4 MG PO TABS: 4.0000 mg | ORAL_TABLET | Freq: Four times a day (QID) | ORAL | Status: AC | PRN

## 2011-10-05 NOTE — Discharge Summary (Signed)
Physician Discharge Summary   Patient ID: Tom Ortiz MRN: [id number] DOB/AGE: 08-22-1927 78 y.o.  Admit date: 10/02/2011 Discharge date: 10/05/2011  Primary Diagnosis: Loosening of Prosthetic Right Total Hip  Admission Diagnoses:  Past Medical History  Diagnosis Date  . Hypertension   . Anxiety   . Arthritis   . Hard of hearing     tinnitus both ears   Discharge Diagnoses:   Principal Problem:  *Failed total hip arthroplasty Active Problems:  Postop Hyponatremia  Procedure: Procedure(s) (LRB): TOTAL HIP REVISION (Right)   Consults: None  HPI: Mr. Budzynski is an 78 year old male who has had a  complex history in regard to his hip replacement. He has had hip  replacement on the right with several dislocations necessitating an  acetabular revision several years ago. He presented to me last year  with severe right thigh pain. Exam and history suggested loosening of  his femoral component, which was suggestive on the plain x-rays  confirmed by the bone scan. He presents now for femoral versus total  hip arthroplasty revision.       Laboratory Data: Hospital Outpatient Visit on 09/28/2011  Component Date Value Range Status  . aPTT 09/28/2011 32  24 - 37 seconds Final  . WBC 09/28/2011 6.7  4.0 - 10.5 K/uL Final  . RBC 09/28/2011 4.41  4.22 - 5.81 MIL/uL Final  . Hemoglobin 09/28/2011 13.9  13.0 - 17.0 g/dL Final  . HCT 62/95/2841 42.0  39.0 - 52.0 % Final  . MCV 09/28/2011 95.2  78.0 - 100.0 fL Final  . MCH 09/28/2011 31.5  26.0 - 34.0 pg Final  . MCHC 09/28/2011 33.1  30.0 - 36.0 g/dL Final  . RDW 32/44/0102 13.8  11.5 - 15.5 % Final  . Platelets 09/28/2011 236  150 - 400 K/uL Final  . Sodium 09/28/2011 140  135 - 145 mEq/L Final  . Potassium 09/28/2011 4.4  3.5 - 5.1 mEq/L Final  . Chloride 09/28/2011 103  96 - 112 mEq/L Final  . CO2 09/28/2011 29  19 - 32 mEq/L Final  . Glucose, Bld 09/28/2011 99  70 - 99 mg/dL Final  . BUN 72/53/6644 18  6 - 23 mg/dL Final   . Creatinine, Ser 09/28/2011 1.02  0.50 - 1.35 mg/dL Final  . Calcium 03/47/4259 8.7  8.4 - 10.5 mg/dL Final  . Total Protein 09/28/2011 6.7  6.0 - 8.3 g/dL Final  . Albumin 56/38/7564 3.2* 3.5 - 5.2 g/dL Final  . AST 33/29/5188 20  0 - 37 U/L Final  . ALT 09/28/2011 17  0 - 53 U/L Final  . Alkaline Phosphatase 09/28/2011 67  39 - 117 U/L Final  . Total Bilirubin 09/28/2011 0.3  0.3 - 1.2 mg/dL Final  . GFR calc non Af Amer 09/28/2011 66* >90 mL/min Final  . GFR calc Af Amer 09/28/2011 77* >90 mL/min Final   Comment:                                 The eGFR has been calculated                          using the CKD EPI equation.                          This calculation has not been  validated in all clinical                          situations.                          eGFR's persistently                          <90 mL/min signify                          possible Chronic Kidney Disease.  Marland Kitchen Prothrombin Time 09/28/2011 13.7  11.6 - 15.2 seconds Final  . INR 09/28/2011 1.03  0.00 - 1.49 Final  . ABO/RH(D) 09/28/2011 O POS   Final  . Antibody Screen 09/28/2011 NEG   Final  . Sample Expiration 09/28/2011 10/05/2011   Final  . Color, Urine 09/28/2011 YELLOW  YELLOW Final  . APPearance 09/28/2011 CLEAR  CLEAR Final  . Specific Gravity, Urine 09/28/2011 1.026  1.005 - 1.030 Final  . pH 09/28/2011 5.5  5.0 - 8.0 Final  . Glucose, UA 09/28/2011 NEGATIVE  NEGATIVE mg/dL Final  . Hgb urine dipstick 09/28/2011 NEGATIVE  NEGATIVE Final  . Bilirubin Urine 09/28/2011 NEGATIVE  NEGATIVE Final  . Ketones, ur 09/28/2011 NEGATIVE  NEGATIVE mg/dL Final  . Protein, ur 16/12/9602 NEGATIVE  NEGATIVE mg/dL Final  . Urobilinogen, UA 09/28/2011 1.0  0.0 - 1.0 mg/dL Final  . Nitrite 54/11/8117 NEGATIVE  NEGATIVE Final  . Leukocytes, UA 09/28/2011 NEGATIVE  NEGATIVE Final   MICROSCOPIC NOT DONE ON URINES WITH NEGATIVE PROTEIN, BLOOD, LEUKOCYTES, NITRITE, OR GLUCOSE <1000 mg/dL.    Marland Kitchen MRSA, PCR 09/28/2011 NEGATIVE  NEGATIVE Final  . Staphylococcus aureus 09/28/2011 NEGATIVE  NEGATIVE Final   Comment:                                 The Xpert SA Assay (FDA                          approved for NASAL specimens                          only), is one component of                          a comprehensive surveillance                          program.  It is not intended                          to diagnose infection nor to                          guide or monitor treatment.  . ABO/RH(D) 09/28/2011 O POS   Final    Basename 10/05/11 0411 10/04/11 0426 10/03/11 0456  HGB 10.5* 10.7* 11.3*    Basename 10/05/11 0411 10/04/11 0426  WBC 8.8 9.8  RBC 3.34* 3.41*  HCT 31.4* 32.2*  PLT 203 198    Basename 10/04/11 0426 10/03/11 0456  NA 136 133*  K 4.2 4.5  CL  105 101  CO2 25 26  BUN 14 14  CREATININE 0.98 1.00  GLUCOSE 161* 154*  CALCIUM 8.1* 8.2*   No results found for this basename: LABPT:2,INR:2 in the last 72 hours  X-Rays:Dg Hip Complete Right  10/02/2011  *RADIOLOGY REPORT*  Clinical Data: Postop right total hip revision  RIGHT HIP - COMPLETE 2+ VIEW  Comparison: Right hip radiographs - 09/28/2011  Findings:  Interval replacement of the femoral stem component of previously identified right total hip arthroplasty.  There is no definite evidence of hardware failure or loosening, though the superior aspect of the hardware is excluded from view on the lateral radiograph. No definite fracture.  There is an expected minimal amount of subcutaneous emphysema about the operative site.  Limited visualization of the knees suggests mild patellofemoral joint degenerative change.  IMPRESSION: Interval revision of the femoral stem component of the right total hip replacement without definite evidence of hardware failure or loosening.  Original Report Authenticated By: Waynard Reeds, M.D.   Dg Hip Complete Right  09/28/2011  *RADIOLOGY REPORT*  Clinical Data: Preoperative  evaluation for right hip revision  RIGHT HIP - COMPLETE 2+ VIEW  Comparison: 12/27/2007  Findings: There are bilateral total hip arthroplasties.  Different devices have been employed.  On the right, there is no specific radiographic finding such as of loosening or peri prostatic fracture.  IMPRESSION: Bilateral total hip arthroplasties.  No abnormality identifiable at plain radiography on the right.  Original Report Authenticated By: Thomasenia Sales, M.D.   Dg Pelvis Portable  10/02/2011  *RADIOLOGY REPORT*  Clinical Data: Right total hip revision.  PORTABLE PELVIS  Comparison: Right femur films 10/02/2011.  Findings: Post right total hip prosthesis revision.  No complication noted.  Surgical drain in place.  Remote left hip prosthesis placement.  Mild degenerative changes lower lumbar spine.  IMPRESSION: Revision right total hip prosthesis without complication noted.  Original Report Authenticated By: Fuller Canada, M.D.    EKG: Orders placed during the hospital encounter of 07/01/11  . ED EKG  . ED EKG  . EKG 12-LEAD  . EKG 12-LEAD  . EKG 12-LEAD  . EKG 12-LEAD  . EKG     Hospital Course: Patient was admitted to [facility] and taken to the OR and underwent the above state procedure without complications.  Patient tolerated the procedure well and was later transferred to the recovery room and then to the orthopaedic floor for postoperative care.  They were given PO and IV analgesics for pain control following their surgery.  They were given 24 hours of postoperative antibiotics and started on DVT prophylaxis in the form of Xarelto.   PT and OT were ordered for total hip protocol.  The patient was allowed to be WBAT with therapy. Discharge planning was consulted to help with postop disposition and equipment needs.  Patient had a decent night on the evening of surgery and started to get up OOB with therapy on day one. He was feeling good on day one.  Hemovac drain was pulled without  difficulty.  The knee immobilizer was removed and discontinued.  Continued to work with therapy into day two and was doing well with minimal amount of pain medicines.  Dressing was changed on day two and the incision was healing well.  By day three, the patient had progressed with therapy and meeting their goals.  Incision was healing well.  Patient was seen in rounds and was ready to go to SNF.  Doing very well overall. Already has less pain than pre-op.   Discharge Medications: Prior to Admission medications   Medication Sig Start Date End Date Taking? Authorizing Provider  LORazepam (ATIVAN) 0.5 MG tablet Take 0.5 mg by mouth at bedtime.   Yes Historical Provider, MD  losartan-hydrochlorothiazide (HYZAAR) 50-12.5 MG per tablet Take 1 tablet by mouth daily with breakfast.    Yes Historical Provider, MD  metoprolol tartrate (LOPRESSOR) 25 MG tablet Take 25 mg by mouth daily with breakfast.   Yes Historical Provider, MD  mirtazapine (REMERON) 30 MG tablet Take 30 mg by mouth at bedtime.   Yes Historical Provider, MD  acetaminophen (TYLENOL) 325 MG tablet Take 2 tablets (650 mg total) by mouth every 6 (six) hours as needed (or Fever >/= 101). 10/05/11 10/04/12  Alexzandrew Perkins, PA  bisacodyl (DULCOLAX) 10 MG suppository Place 1 suppository (10 mg total) rectally daily as needed. 10/05/11 10/15/11  Alexzandrew Perkins, PA  docusate sodium 100 MG CAPS Take 100 mg by mouth 2 (two) times daily. 10/05/11 10/15/11  Alexzandrew Perkins, PA  methocarbamol (ROBAXIN) 500 MG tablet Take 1 tablet (500 mg total) by mouth every 6 (six) hours as needed. 10/05/11 10/15/11  Alexzandrew Perkins, PA  ondansetron (ZOFRAN) 4 MG tablet Take 1 tablet (4 mg total) by mouth every 6 (six) hours as needed for nausea. 10/05/11 10/12/11  Alexzandrew Perkins, PA  oxyCODONE (OXY IR/ROXICODONE) 5 MG immediate release tablet Take 1-2 tablets (5-10 mg total) by mouth every 3 (three) hours as needed. 10/05/11 10/15/11  Alexzandrew Perkins, PA    polyethylene glycol (MIRALAX / GLYCOLAX) packet Take 17 g by mouth daily as needed. 10/05/11 10/08/11  Alexzandrew Julien Girt, PA  predniSONE (DELTASONE) 5 MG tablet Take 1 tablet (5 mg total) by mouth 2 (two) times daily with a meal for one more day only to finish taper. 10/05/11 10/15/11  Alexzandrew Julien Girt, PA  predniSONE (DELTASONE) 5 MG tablet Take 1 tablet (5 mg total) by mouth daily with breakfast. 10/06/11 10/15/11  Alexzandrew Julien Girt, PA  rivaroxaban (XARELTO) 10 MG TABS tablet Take 1 tablet (10 mg total) by mouth daily with breakfast. Take Xarelto for two and a half more weeks, then discontinue Xarelto. Once the patient has completed the Xarelto, they may resume the 325 mg Aspirin. 10/05/11   Alexzandrew Julien Girt, PA    Diet: Cardiac diet Activity: Partial Weight Bearing 25-50% No bending hip over 90 degrees- A "L" Angle Do not cross legs Do not let foot roll inward When turning these patients a pillow should be placed between the patient's legs to prevent crossing. Patients should have the affected knee fully extended when trying to sit or stand from all surfaces to prevent excessive hip flexion. When ambulating and turning toward the affected side the affected leg should have the toes turned out prior to moving the walker and the rest of patient's body as to prevent internal rotation/ turning in of the leg. Abduction pillows are the most effective way to prevent a patient from not crossing legs or turning toes in at rest. If an abduction pillow is not ordered placing a regular pillow length wise between the patient's legs is also an effective reminder. It is imperative that these precautions be maintained so that the surgical hip does not dislocate. Follow-up:in 2 weeks Disposition - Skilled nursing facility Discharged Condition: good   Discharge Orders    Future Orders Please Complete By Expires   Diet - low sodium heart healthy      Call  MD / Call 911      Comments:   If you  experience chest pain or shortness of breath, CALL 911 and be transported to the hospital emergency room.  If you develope a fever above 101 F, pus (white drainage) or increased drainage or redness at the wound, or calf pain, call your surgeon's office.   Discharge instructions      Comments:   Pick up stool softner and laxative for home. Do not submerge incision under water. May shower. Continue to use ice for pain and swelling from surgery. Hip precautions.  Total Hip Protocol.  Take Xarelto for two and a half more weeks, then discontinue Xarelto. Once the patient has completed the Xarelto, they may resume the 325 mg Aspirin.  When discharged from the skilled rehab facility, please have the facility set up the patient's Home Health Physical Therapy prior to being released.  Also provide the patient with their medications at time of release from the facility to include their pain medication, the muscle relaxants, and their blood thinner medication.  If the patient is still at the rehab facility at time of follow up appointment, please also assist the patient in arranging follow up appointment in our office and any transportation needs.   Constipation Prevention      Comments:   Drink plenty of fluids.  Prune juice may be helpful.  You may use a stool softener, such as Colace (over the counter) 100 mg twice a day.  Use MiraLax (over the counter) for constipation as needed.   Increase activity slowly as tolerated      Patient may shower      Comments:   You may shower without a dressing once there is no drainage.  Do not wash over the wound.  If drainage remains, do not shower until drainage stops.   Driving restrictions      Comments:   No driving until released by the physician.   Lifting restrictions      Comments:   No lifting until released by the physician.   Follow the hip precautions as taught in Physical Therapy      Change dressing      Comments:   You may change your dressing  dressing daily with sterile 4 x 4 inch gauze dressing and paper tape.  Do not submerge the incision under water.   TED hose      Comments:   Use stockings (TED hose) for 3 weeks on both leg(s).  You may remove them at night for sleeping.   Do not sit on low chairs, stoools or toilet seats, as it may be difficult to get up from low surfaces        Medication List  As of 10/05/2011  9:21 AM   STOP taking these medications         aspirin 325 MG tablet      calcium carbonate-magnesium hydroxide 334 MG Chew      cholecalciferol 1000 UNITS tablet         TAKE these medications         acetaminophen 325 MG tablet   Commonly known as: TYLENOL   Take 2 tablets (650 mg total) by mouth every 6 (six) hours as needed (or Fever >/= 101).      bisacodyl 10 MG suppository   Commonly known as: DULCOLAX   Place 1 suppository (10 mg total) rectally daily as needed.      DSS 100 MG Caps  Take 100 mg by mouth 2 (two) times daily.      LORazepam 0.5 MG tablet   Commonly known as: ATIVAN   Take 0.5 mg by mouth at bedtime.      losartan-hydrochlorothiazide 50-12.5 MG per tablet   Commonly known as: HYZAAR   Take 1 tablet by mouth daily with breakfast.      methocarbamol 500 MG tablet   Commonly known as: ROBAXIN   Take 1 tablet (500 mg total) by mouth every 6 (six) hours as needed.      metoprolol tartrate 25 MG tablet   Commonly known as: LOPRESSOR   Take 25 mg by mouth daily with breakfast.      mirtazapine 30 MG tablet   Commonly known as: REMERON   Take 30 mg by mouth at bedtime.      ondansetron 4 MG tablet   Commonly known as: ZOFRAN   Take 1 tablet (4 mg total) by mouth every 6 (six) hours as needed for nausea.      oxyCODONE 5 MG immediate release tablet   Commonly known as: Oxy IR/ROXICODONE   Take 1-2 tablets (5-10 mg total) by mouth every 3 (three) hours as needed.      polyethylene glycol packet   Commonly known as: MIRALAX / GLYCOLAX   Take 17 g by mouth daily as  needed.      predniSONE 5 MG tablet   Commonly known as: DELTASONE   Take 1 tablet (5 mg total) by mouth 2 (two) times daily with a meal.      predniSONE 5 MG tablet   Commonly known as: DELTASONE   Take 1 tablet (5 mg total) by mouth daily with breakfast.      rivaroxaban 10 MG Tabs tablet   Commonly known as: XARELTO   Take 1 tablet (10 mg total) by mouth daily with breakfast. Take Xarelto for two and a half more weeks, then discontinue Xarelto.  Once the patient has completed the Xarelto, they may resume the 325 mg Aspirin.           Follow-up Information    Follow up with Loanne Drilling, MD. Schedule an appointment as soon as possible for a visit in 2 weeks.   Contact information:   [facility] [address] [place] [address] [phone]          Signed: NAVIN, DOGAN 10/05/2011, 9:21 AM

## 2011-10-05 NOTE — Progress Notes (Signed)
Patient cleared for discharge. Patient accepted to [facility]. Packet copied and placed in [facility]. ptar called for transportation.  Bethany C. Corcoran MSW, LCSW [phone]

## 2011-10-05 NOTE — Progress Notes (Signed)
   Subjective: 3 Days Post-Op Procedure(s) (LRB): TOTAL HIP REVISION (Right) Patient reports pain as mild.   Doing very well overall. Already has less pain than pre-op Plan is to go Skilled nursing facility after hospital stay.  Objective: Vital signs in last 24 hours: Temp:  [98.1 F (36.7 C)] 98.1 F (36.7 C) (07/22 0540) Pulse Rate:  [60] 60  (07/22 0540) Resp:  [16-20] 20  (07/22 0540) BP: (134)/(80) 134/80 mmHg (07/22 0540) SpO2:  [92 %] 92 % (07/22 0540)  Intake/Output from previous day:  Intake/Output Summary (Last 24 hours) at 10/05/11 0647 Last data filed at 10/05/11 0540  Gross per 24 hour  Intake 366.25 ml  Output    900 ml  Net -533.75 ml    Intake/Output this shift: Total I/O In: -  Out: 400 [Urine:400]  Labs:  [facility] 10/05/11 0411 10/04/11 0426 10/03/11 0456  HGB 10.5* 10.7* 11.3*    Basename 10/05/11 0411 10/04/11 0426  WBC 8.8 9.8  RBC 3.34* 3.41*  HCT 31.4* 32.2*  PLT 203 198    Basename 10/04/11 0426 10/03/11 0456  NA 136 133*  K 4.2 4.5  CL 105 101  CO2 25 26  BUN 14 14  CREATININE 0.98 1.00  GLUCOSE 161* 154*  CALCIUM 8.1* 8.2*   No results found for this basename: LABPT:2,INR:2 in the last 72 hours  EXAM General - Patient is Alert, Appropriate and Oriented Extremity - Neurologically intact Neurovascular intact Incision: dressing C/D/I No cellulitis present Compartment soft Dressing/Incision - clean, dry, no drainage Motor Function - intact, moving foot and toes well on exam.   Past Medical History  Diagnosis Date  . Hypertension   . Anxiety   . Arthritis   . Hard of hearing     tinnitus both ears    Assessment/Plan: 3 Days Post-Op Procedure(s) (LRB): TOTAL HIP REVISION (Right) Principal Problem:  *Failed total hip arthroplasty   Up with therapy Discharge to SNF  DVT Prophylaxis - Xarelto 25-50% PWB right Leg  ALUISIO,FRANK V 10/05/2011, 6:47 AM

## 2011-10-05 NOTE — Progress Notes (Signed)
Physical Therapy Treatment Patient Details Name: Tom Ortiz MRN: [id number] DOB: 03/30/1927 Today's Date: 10/05/2011 Time: 9147-8295 PT Time Calculation (min): 10 min  PT Assessment / Plan / Recommendation Comments on Treatment Session  Pt feeling nauseated this morning and did not wish to ambulate today but agreeable to exercises in bed.  Pt reports d/c to SNF later today.    Follow Up Recommendations  Skilled nursing facility    Barriers to Discharge        Equipment Recommendations  Defer to next venue    Recommendations for Other Services    Frequency     Plan Discharge plan remains appropriate;Frequency remains appropriate    Precautions / Restrictions Precautions Precautions: Posterior Hip;Fall Precaution Comments: no orders to D/C KI; pt with hx of multiple dislocations) Required Braces or Orthoses: Knee Immobilizer - Right Restrictions Weight Bearing Restrictions: Yes RLE Weight Bearing: Partial weight bearing RLE Partial Weight Bearing Percentage or Pounds: 25-50%   Pertinent Vitals/Pain 3/10 R hip, repositioned    Mobility       Exercises Total Joint Exercises Ankle Circles/Pumps: AROM;Both;20 reps Quad Sets: AROM;Both;20 reps Gluteal Sets: AROM;Both;20 reps Short Arc Quad: AROM;Strengthening;Right;20 reps Heel Slides: AAROM;Right;20 reps Hip ABduction/ADduction: AAROM;Right;20 reps   PT Diagnosis:    PT Problem List:   PT Treatment Interventions:     PT Goals Acute Rehab PT Goals PT Goal: Perform Home Exercise Program - Progress: Progressing toward goal  Visit Information  Last PT Received On: 10/05/11 Assistance Needed: +1    Subjective Data  Subjective: I just started feeling better. I'd rather not walk.   Cognition  Overall Cognitive Status: Appears within functional limits for tasks assessed/performed    Balance     End of Session PT - End of Session Activity Tolerance: Patient tolerated treatment well Patient left: in bed;with  call bell/phone within reach   GP     [facility] E 10/05/2011, 1:55 PM Pager: [phone]

## 2011-12-08 ENCOUNTER — Encounter (HOSPITAL_COMMUNITY): Payer: Self-pay | Admitting: *Deleted

## 2011-12-08 ENCOUNTER — Encounter (HOSPITAL_COMMUNITY)
Admission: EM | Disposition: A | Discharge status: Home / Self Care | Payer: Self-pay | Source: Home / Self Care | Attending: Emergency Medicine

## 2011-12-08 ENCOUNTER — Emergency Department (HOSPITAL_COMMUNITY): Payer: MEDICARE

## 2011-12-08 ENCOUNTER — Emergency Department (HOSPITAL_COMMUNITY)
Admission: EM | Admit: 2011-12-08 | Discharge: 2011-12-08 | Disposition: A | Discharge status: Home / Self Care | Payer: MEDICARE | Attending: Emergency Medicine | Admitting: Emergency Medicine

## 2011-12-08 ENCOUNTER — Emergency Department (HOSPITAL_COMMUNITY): Payer: MEDICARE | Admitting: Anesthesiology

## 2011-12-08 ENCOUNTER — Encounter (HOSPITAL_COMMUNITY): Payer: Self-pay | Admitting: Anesthesiology

## 2011-12-08 DIAGNOSIS — X58XXXA Exposure to other specified factors, initial encounter: Secondary | ICD-10-CM | POA: Insufficient documentation

## 2011-12-08 DIAGNOSIS — Z79899 Other long term (current) drug therapy: Secondary | ICD-10-CM | POA: Insufficient documentation

## 2011-12-08 DIAGNOSIS — Z96649 Presence of unspecified artificial hip joint: Secondary | ICD-10-CM | POA: Insufficient documentation

## 2011-12-08 DIAGNOSIS — I10 Essential (primary) hypertension: Secondary | ICD-10-CM | POA: Insufficient documentation

## 2011-12-08 DIAGNOSIS — T84029A Dislocation of unspecified internal joint prosthesis, initial encounter: Secondary | ICD-10-CM | POA: Insufficient documentation

## 2011-12-08 DIAGNOSIS — R269 Unspecified abnormalities of gait and mobility: Secondary | ICD-10-CM | POA: Insufficient documentation

## 2011-12-08 HISTORY — PX: HIP CLOSED REDUCTION: SHX983

## 2011-12-08 SURGERY — CLOSED MANIPULATION, JOINT, HIP
Anesthesia: General | Site: Hip | Laterality: Left | Wound class: Clean

## 2011-12-08 MED ORDER — HYDROMORPHONE HCL PF 1 MG/ML IJ SOLN: 0.2500 mg | INTRAMUSCULAR | Status: DC | PRN

## 2011-12-08 MED ORDER — LIDOCAINE HCL (CARDIAC) 10 MG/ML IV SOLN
INTRAVENOUS | Status: DC | PRN
  Administered 2011-12-08: 60 mg via INTRAVENOUS

## 2011-12-08 MED ORDER — HYDROMORPHONE HCL PF 1 MG/ML IJ SOLN
1.0000 mg | Freq: Once | INTRAMUSCULAR | Status: AC
  Administered 2011-12-08: 1 mg via INTRAVENOUS
  Filled 2011-12-08: qty 1

## 2011-12-08 MED ORDER — ONDANSETRON HCL 4 MG/2ML IJ SOLN
INTRAMUSCULAR | Status: DC | PRN
  Administered 2011-12-08: 4 mg via INTRAVENOUS

## 2011-12-08 MED ORDER — FENTANYL CITRATE 0.05 MG/ML IJ SOLN
INTRAMUSCULAR | Status: DC | PRN
  Administered 2011-12-08: 100 ug via INTRAVENOUS
  Administered 2011-12-08: 50 ug via INTRAVENOUS

## 2011-12-08 MED ORDER — PROMETHAZINE HCL 25 MG/ML IJ SOLN: 6.2500 mg | INTRAMUSCULAR | Status: DC | PRN

## 2011-12-08 MED ORDER — METHOCARBAMOL 500 MG PO TABS: 500.0000 mg | ORAL_TABLET | Freq: Four times a day (QID) | ORAL | Status: DC | PRN

## 2011-12-08 MED ORDER — MIDAZOLAM HCL 5 MG/5ML IJ SOLN
INTRAMUSCULAR | Status: DC | PRN
  Administered 2011-12-08: 1 mg via INTRAVENOUS

## 2011-12-08 MED ORDER — ONDANSETRON HCL 4 MG/2ML IJ SOLN
4.0000 mg | Freq: Once | INTRAMUSCULAR | Status: AC
  Administered 2011-12-08: 4 mg via INTRAVENOUS
  Filled 2011-12-08: qty 2

## 2011-12-08 MED ORDER — PROPOFOL 10 MG/ML IV BOLUS
INTRAVENOUS | Status: DC | PRN
  Administered 2011-12-08: 150 mg via INTRAVENOUS

## 2011-12-08 MED ORDER — LACTATED RINGERS IV SOLN
INTRAVENOUS | Status: DC | PRN
  Administered 2011-12-08: 20:00:00 via INTRAVENOUS

## 2011-12-08 MED ORDER — LACTATED RINGERS IV SOLN: INTRAVENOUS | Status: DC

## 2011-12-08 MED ORDER — FENTANYL CITRATE 0.05 MG/ML IJ SOLN
50.0000 ug | Freq: Once | INTRAMUSCULAR | Status: AC
  Administered 2011-12-08: 50 ug via INTRAVENOUS

## 2011-12-08 MED ORDER — METHOCARBAMOL 100 MG/ML IJ SOLN
500.0000 mg | Freq: Once | INTRAMUSCULAR | Status: DC
  Filled 2011-12-08: qty 5

## 2011-12-08 MED ORDER — PROPOFOL 10 MG/ML IV BOLUS: 0.5000 mg/kg | Freq: Once | INTRAVENOUS | Status: DC

## 2011-12-08 MED ORDER — ACETAMINOPHEN 10 MG/ML IV SOLN
INTRAVENOUS | Status: AC
  Filled 2011-12-08: qty 100

## 2011-12-08 MED ORDER — METHOCARBAMOL 100 MG/ML IJ SOLN
500.0000 mg | Freq: Once | INTRAVENOUS | Status: DC
  Filled 2011-12-08: qty 5

## 2011-12-08 MED ORDER — FENTANYL CITRATE 0.05 MG/ML IJ SOLN
50.0000 ug | Freq: Once | INTRAMUSCULAR | Status: AC
  Administered 2011-12-08: 50 ug via INTRAVENOUS
  Filled 2011-12-08: qty 2

## 2011-12-08 MED ORDER — OXYCODONE HCL 5 MG PO TABS: 5.0000 mg | ORAL_TABLET | ORAL | Status: DC | PRN

## 2011-12-08 MED ORDER — ACETAMINOPHEN 10 MG/ML IV SOLN
INTRAVENOUS | Status: DC | PRN
  Administered 2011-12-08: 1000 mg via INTRAVENOUS

## 2011-12-08 SURGICAL SUPPLY — 3 items
IMMOBILIZER KNEE 22  40 CIR (ORTHOPEDIC SUPPLIES) ×1
IMMOBILIZER KNEE 22 40 CIR (ORTHOPEDIC SUPPLIES) ×1 IMPLANT
IMMOBILIZER KNEE 22 UNIV (SOFTGOODS) ×1 IMPLANT

## 2011-12-08 NOTE — H&P (Signed)
Tom Ortiz is an 78 y.o. male.   Chief Complaint: Severe left hip pain HPI: 78 year old gentleman with history of bilateral total hip arthroplasties developed severe left hip pain today while getting out of the car in the usual manner. He was unable to get up and walk around move around he was brought back to the emergency room today by private vehicle for evaluation of his left hip pain. X-rays revealed a dislocated left total hip arthroplasty. Patient recently went through a revision of his right total hip arthroplasty by Dr. Lequita Halt due to multiple dislocations of his right hip. Patient now reports this is the fourth dislocation of his left hip and it is the most painful. He denies any recent illnesses. Last meal was before the dislocation which was about 2:15 this afternoon  Past Medical History  Diagnosis Date  . Hypertension   . Anxiety   . Arthritis   . Hard of hearing     tinnitus both ears    Past Surgical History  Procedure Date  . Hip closed reduction 07/01/2011, left    Procedure: CLOSED MANIPULATION HIP;  Surgeon: Drucilla Schmidt, MD;  Location: [facility];  Service: Orthopedics;  Laterality: Left;  . Hip closed reduction 06/11/2011    Procedure: CLOSED MANIPULATION HIP;  Surgeon: Drucilla Schmidt, MD;  Location: [facility];  Service: Orthopedics;  Laterality: Left;  . Hip closed reduction 07/01/2011    Procedure: CLOSED MANIPULATION HIP;  Surgeon: Drucilla Schmidt, MD;  Location: [facility];  Service: Orthopedics;  Laterality: Left;  . Joint replacement 2007 right, left 17 yrs ago    Bilateral hip  . Appendectomy yrs ago  . Hernia repair yrs ago  . Tonsillectomy as child  . Total hip revision 10/02/2011    Procedure: TOTAL HIP REVISION;  Surgeon: Loanne Drilling, MD;  Location: [facility];  Service: Orthopedics;  Laterality: Right;    No family history on file. Social History:  reports that he has never smoked. He has never used smokeless tobacco. He reports that he does not drink  alcohol or use illicit drugs.  Allergies:  Allergies  Allergen Reactions  . Quinolones Hives and Rash    78 yr old allergy to quinine     (Not in a hospital admission)  No results found for this or any previous visit (from the past 48 hour(s)). Dg Chest 1 View  12/08/2011  *RADIOLOGY REPORT*  Clinical Data: Preop  CHEST - 1 VIEW  Comparison: 06/11/2011  Findings: Normal heart size.  Left basilar scar.  No consolidation or mass.  No acute bony deformity.  No pneumothorax.  Bony densities about the proximal left humerus are not significantly changed.  IMPRESSION: No active cardiopulmonary disease.   Original Report Authenticated By: Donavan Burnet, M.D.    Dg Hip Complete Left  12/08/2011  *RADIOLOGY REPORT*  Clinical Data: Hip pain, history of dislocated hip prosthesis  LEFT HIP - COMPLETE 2+ VIEW  Comparison: 07/01/2011  Findings: Left hip femoral component is dislocated superiorly in relation to the acetabular component.  This appears similar to the 07/01/2011 dislocation.  Right hip prosthesis has anatomic alignment in the frontal plane.  Bones appear osteopenic.  No displaced acute fracture noted.  IMPRESSION: Recurrent dislocated left hip prosthesis.   Original Report Authenticated By: Judie Petit. Ruel Favors, M.D.     Review of Systems  Musculoskeletal: Positive for joint pain.       Left hip pain  All other systems reviewed and  are negative.    Blood pressure 145/72, pulse 80, temperature 97.3 F (36.3 C), temperature source Oral, resp. rate 24, weight 80.3 kg (177 lb 0.5 oz), SpO2 100.00%. Physical Exam  patient is a conscious alert and appropriate age-appropriate gentleman appears to be moderately uncomfortable in a hospital gurney. Patient has significant increased discomfort with any attempts at or any in involuntary motions of his left leg. He has good range of motion of his neck without any discomfort is got full range of motion of his upper tremoring is without discomfort his lungs  were clear throughout his heart was regular rate and rhythm his abdomen was soft nontender his left leg did have significant pain with any attempts at motion it was shortened with toes pointing straight up he had good distal pulses good sensation lower extremities. His right lower extremity was not move due to any pain with movement of the lower extremity is caused severe left hip pain but he was neurologically and vascular intact in the foot. Patient had no discomfort in his left lower tremoring with palpation.   Assessment/Plan  severe left hip pain with dislocated left total hip arthroplasty Hypertension History depression History anxiety History of rheumatoid arthritis History of tinnitus  Plan take to the OR for a closed reduction of his left total hip arthroplasty under general anesthesia due to the severe pain. Discharge probable discharge home this evening with a long-leg immobilizer on the left and followup with Dr.Aluisio in the next week for further discussion to treat himself his left total hip arthroplasty   Jamelle Rushing 12/08/2011, 7:04 PM

## 2011-12-08 NOTE — ED Notes (Signed)
Pt in by private vehicle. Was getting out of car this afternoon approx 1430. Felt "an explosion" in L hip. Hx of 10 previous hip dislocations. However, pt sts pain feels as though it is "deeper" in thigh and worries prosthetic may be messed up.

## 2011-12-08 NOTE — ED Notes (Signed)
Dr Thomasena Edis orthopedic surgeon called, will do closed reduction L hip 1 hour.

## 2011-12-08 NOTE — ED Notes (Signed)
Pt requesting in and out cath 

## 2011-12-08 NOTE — Anesthesia Postprocedure Evaluation (Signed)
  Anesthesia Post-op Note  Patient: Tom Ortiz  Procedure(s) Performed: Procedure(s) (LRB): CLOSED MANIPULATION HIP (Left)  Patient Location: PACU  Anesthesia Type: General  Level of Consciousness: awake and alert   Airway and Oxygen Therapy: Patient Spontanous Breathing  Post-op Pain: mild  Post-op Assessment: Post-op Vital signs reviewed, Patient's Cardiovascular Status Stable, Respiratory Function Stable, Patent Airway and No signs of Nausea or vomiting  Post-op Vital Signs: stable  Complications: No apparent anesthesia complications

## 2011-12-08 NOTE — ED Provider Notes (Signed)
History     CSN: [id number]  Arrival date & time 12/08/11  1601   First MD Initiated Contact with Patient 12/08/11 1609      Chief Complaint  Patient presents with  . Hip Dislocation     (Consider location/radiation/quality/duration/timing/severity/associated sxs/prior treatment) The history is provided by the patient.   patient with left hip pain. States she's trying to get out of his car and felt pop in his left hip. His multiple previous hip dislocations, he states this feels different. She's been unable walk. He states that last dislocated had to be reduced in the operating room. No numbness or weakness. No other injury.  Past Medical History  Diagnosis Date  . Hypertension   . Anxiety   . Arthritis   . Hard of hearing     tinnitus both ears    Past Surgical History  Procedure Date  . Hip closed reduction 07/01/2011, left    Procedure: CLOSED MANIPULATION HIP;  Surgeon: Drucilla Schmidt, MD;  Location: [facility];  Service: Orthopedics;  Laterality: Left;  . Hip closed reduction 06/11/2011    Procedure: CLOSED MANIPULATION HIP;  Surgeon: Drucilla Schmidt, MD;  Location: [facility];  Service: Orthopedics;  Laterality: Left;  . Hip closed reduction 07/01/2011    Procedure: CLOSED MANIPULATION HIP;  Surgeon: Drucilla Schmidt, MD;  Location: [facility];  Service: Orthopedics;  Laterality: Left;  . Joint replacement 2007 right, left 17 yrs ago    Bilateral hip  . Appendectomy yrs ago  . Hernia repair yrs ago  . Tonsillectomy as child  . Total hip revision 10/02/2011    Procedure: TOTAL HIP REVISION;  Surgeon: Loanne Drilling, MD;  Location: [facility];  Service: Orthopedics;  Laterality: Right;    No family history on file.  History  Substance Use Topics  . Smoking status: Never Smoker   . Smokeless tobacco: Never Used  . Alcohol Use: No      Review of Systems  Constitutional: Negative for appetite change.  Respiratory: Negative for chest tightness and shortness of breath.     Cardiovascular: Negative for leg swelling.  Gastrointestinal: Negative for abdominal pain.  Musculoskeletal: Positive for gait problem. Negative for back pain.  Neurological: Negative for weakness and numbness.  Hematological: Negative for adenopathy.    Allergies  Quinolones  Home Medications   Current Outpatient Rx  Name Route Sig Dispense Refill  . ACETAMINOPHEN 325 MG PO TABS Oral Take 650 mg by mouth every 6 (six) hours as needed. Fever    . LORAZEPAM 0.5 MG PO TABS Oral Take 0.5 mg by mouth at bedtime.    Marland Kitchen LOSARTAN POTASSIUM-HCTZ 50-12.5 MG PO TABS Oral Take 1 tablet by mouth daily with breakfast.     . MIRTAZAPINE 30 MG PO TABS Oral Take 30 mg by mouth at bedtime.    Marland Kitchen ONDANSETRON HCL 4 MG PO TABS Oral Take 4 mg by mouth every 8 (eight) hours as needed. Nausea    . OXYCODONE HCL 5 MG PO TABS Oral Take 5 mg by mouth every 4 (four) hours as needed. Pain    . PREDNISOLONE 5 MG PO TABS Oral Take 5 mg by mouth as needed.    . METHOCARBAMOL 500 MG PO TABS Oral Take 1 tablet (500 mg total) by mouth every 6 (six) hours as needed. 40 tablet 4  . OXYCODONE HCL 5 MG PO TABS Oral Take 1-2 tablets (5-10 mg total) by mouth every 4 (four) hours as needed  for pain. 30 tablet 0    BP 159/88  Pulse 75  Temp 98 F (36.7 C) (Oral)  Resp 18  Wt 177 lb 0.5 oz (80.3 kg)  SpO2 95%  Physical Exam  Constitutional: He is oriented to person, place, and time. He appears well-developed.  HENT:  Head: Normocephalic.  Eyes: Pupils are equal, round, and reactive to light.  Cardiovascular: Normal rate and regular rhythm.   Pulmonary/Chest: Effort normal.  Abdominal: Soft. Bowel sounds are normal.  Musculoskeletal:       Tenderness to left hip laterally and somewhat medially. Decreased range of motion. Pain with any movement of lower extremity neurovascular intact distally..  Neurological: He is alert and oriented to person, place, and time.  Skin: Skin is warm.    ED Course  Procedures  (including critical care time)  Labs Reviewed - No data to display Dg Chest 1 View  12/08/2011  *RADIOLOGY REPORT*  Clinical Data: Preop  CHEST - 1 VIEW  Comparison: 06/11/2011  Findings: Normal heart size.  Left basilar scar.  No consolidation or mass.  No acute bony deformity.  No pneumothorax.  Bony densities about the proximal left humerus are not significantly changed.  IMPRESSION: No active cardiopulmonary disease.   Original Report Authenticated By: Donavan Burnet, M.D.    Dg Hip Complete Left  12/08/2011  *RADIOLOGY REPORT*  Clinical Data: Hip pain, history of dislocated hip prosthesis  LEFT HIP - COMPLETE 2+ VIEW  Comparison: 07/01/2011  Findings: Left hip femoral component is dislocated superiorly in relation to the acetabular component.  This appears similar to the 07/01/2011 dislocation.  Right hip prosthesis has anatomic alignment in the frontal plane.  Bones appear osteopenic.  No displaced acute fracture noted.  IMPRESSION: Recurrent dislocated left hip prosthesis.   Original Report Authenticated By: Judie Petit. Ruel Favors, M.D.    Dg Hip Operative Left  12/08/2011  *RADIOLOGY REPORT*  Clinical Data: 78 year old male undergoing closed reduction of the left hip.  OPERATIVE LEFT HIP  Comparison: 1651 hours the same day.  Fluoroscopy time of 0.2 minutes was utilized.  Findings: Two intraoperative fluoroscopic views demonstrate reduced left hip arthroplasty dislocation.  Visualized hardware components appear intact normally aligned.  IMPRESSION: Interval reduced left hip arthroplasty dislocation.   Original Report Authenticated By: Harley Hallmark, M.D.      1. Dislocation of hip prosthesis       MDM  Patient with hip prosthesis dislocation. History of same. Seen in the ER by portal and will be taken to or for reduction.        Juliet Rude. Rubin Payor, MD 12/09/11 0003

## 2011-12-08 NOTE — Op Note (Signed)
Preop diagnosis dislocated left total hip arthroplasty Postoperative diagnosis same Procedure closed reduction of dislocated left total hip arthroplasty. Stress radiography C-arm. Surgeon Valma Cava M.D. Assistant Kingsley Spittle Anesthesia Gen. History blood loss none Complications none Disposition PACU stable  Operative details Patient was counseled in the holding area cracks out was marked appropriately. Taken operating room placed supine position under general anesthesia. Gently placed on the OR table. Utilizing gentle longitudinal traction flexion gentle internal/external rotation a palpable reduction couldn't palpated relatively easily. Leg lengths were symmetric at this. C-arm confirmed anatomic reduction of the dislocated total hip and no complicating features. Stress radiography revealed him to be stable to 90 of flexion 30 of abduction and 20 extra rotation. A knee immobilizer was applied. He was awakened. No complications or problems. He we stabilized the PACU and discharge to home tonight.

## 2011-12-08 NOTE — Transfer of Care (Signed)
Immediate Anesthesia Transfer of Care Note  Patient: Tom Ortiz  Procedure(s) Performed: Procedure(s) (LRB) with comments: CLOSED MANIPULATION HIP (Left)  Patient Location: PACU  Anesthesia Type: General  Level of Consciousness: awake, alert , oriented and patient cooperative  Airway & Oxygen Therapy: Patient Spontanous Breathing and Patient connected to face mask oxygen  Post-op Assessment: Report given to PACU RN, Post -op Vital signs reviewed and stable and Patient moving all extremities X 4  Post vital signs: stable  Complications: No apparent anesthesia complications

## 2011-12-08 NOTE — Anesthesia Preprocedure Evaluation (Signed)
Anesthesia Evaluation  Patient identified by MRN, date of birth, ID band Patient awake    Reviewed: Allergy & Precautions, H&P , NPO status , Patient's Chart, lab work & pertinent test results  Airway Mallampati: II TM Distance: >3 FB Neck ROM: Full    Dental No notable dental hx.    Pulmonary neg pulmonary ROS,  breath sounds clear to auscultation  Pulmonary exam normal       Cardiovascular hypertension, Pt. on medications Rhythm:Regular Rate:Normal     Neuro/Psych Anxiety negative neurological ROS     GI/Hepatic negative GI ROS, Neg liver ROS,   Endo/Other  negative endocrine ROS  Renal/GU negative Renal ROS  negative genitourinary   Musculoskeletal negative musculoskeletal ROS (+)   Abdominal   Peds negative pediatric ROS (+)  Hematology negative hematology ROS (+)   Anesthesia Other Findings   Reproductive/Obstetrics negative OB ROS                           Anesthesia Physical Anesthesia Plan  ASA: II  Anesthesia Plan: General   Post-op Pain Management:    Induction: Intravenous  Airway Management Planned: LMA  Additional Equipment:   Intra-op Plan:   Post-operative Plan: Extubation in OR  Informed Consent: I have reviewed the patients History and Physical, chart, labs and discussed the procedure including the risks, benefits and alternatives for the proposed anesthesia with the patient or authorized representative who has indicated his/her understanding and acceptance.   Dental advisory given  Plan Discussed with: CRNA  Anesthesia Plan Comments:         Anesthesia Quick Evaluation

## 2011-12-09 ENCOUNTER — Encounter (HOSPITAL_COMMUNITY): Payer: Self-pay | Admitting: Specialist

## 2011-12-27 ENCOUNTER — Other Ambulatory Visit: Payer: Self-pay | Admitting: Orthopedic Surgery

## 2011-12-27 MED ORDER — DEXAMETHASONE SODIUM PHOSPHATE 10 MG/ML IJ SOLN: 10.0000 mg | Freq: Once | INTRAMUSCULAR | Status: DC

## 2011-12-27 MED ORDER — BUPIVACAINE LIPOSOME 1.3 % IJ SUSP: 20.0000 mL | Freq: Once | INTRAMUSCULAR | Status: DC

## 2011-12-27 NOTE — Progress Notes (Signed)
Preoperative surgical orders have been place into the [facility] hospital system for DAM ASHRAF on 12/27/2011, 10:57 AM  by Patrica Duel for surgery on 01/27/12.  Preop Total Hip orders including Experel Injecion, IV Tylenol, and IV Decadron as long as there are no contraindications to the above medications. Avel Peace, PA-C

## 2012-01-22 ENCOUNTER — Ambulatory Visit (HOSPITAL_COMMUNITY)
Admission: RE | Admit: 2012-01-22 | Discharge: 2012-01-22 | Disposition: A | Discharge status: Home / Self Care | Payer: MEDICARE | Source: Ambulatory Visit | Attending: Orthopedic Surgery | Admitting: Orthopedic Surgery

## 2012-01-22 ENCOUNTER — Encounter (HOSPITAL_COMMUNITY): Payer: Self-pay

## 2012-01-22 ENCOUNTER — Encounter (HOSPITAL_COMMUNITY)
Admission: RE | Admit: 2012-01-22 | Discharge: 2012-01-22 | Disposition: A | Discharge status: Home / Self Care | Payer: MEDICARE | Source: Ambulatory Visit | Attending: Orthopedic Surgery | Admitting: Orthopedic Surgery

## 2012-01-22 DIAGNOSIS — Z96649 Presence of unspecified artificial hip joint: Secondary | ICD-10-CM | POA: Insufficient documentation

## 2012-01-22 DIAGNOSIS — Z01812 Encounter for preprocedural laboratory examination: Secondary | ICD-10-CM | POA: Insufficient documentation

## 2012-01-22 DIAGNOSIS — Z01818 Encounter for other preprocedural examination: Secondary | ICD-10-CM | POA: Insufficient documentation

## 2012-01-22 LAB — CBC
HCT: 38.8 % — ABNORMAL LOW (ref 39.0–52.0)
Hemoglobin: 12.6 g/dL — ABNORMAL LOW (ref 13.0–17.0)
MCH: 30.1 pg (ref 26.0–34.0)
MCHC: 32.5 g/dL (ref 30.0–36.0)
MCV: 92.8 fL (ref 78.0–100.0)
Platelets: 303 10*3/uL (ref 150–400)
RBC: 4.18 MIL/uL — ABNORMAL LOW (ref 4.22–5.81)
RDW: 14 % (ref 11.5–15.5)
WBC: 5.8 10*3/uL (ref 4.0–10.5)

## 2012-01-22 LAB — URINALYSIS, ROUTINE W REFLEX MICROSCOPIC
Bilirubin Urine: NEGATIVE
Glucose, UA: NEGATIVE mg/dL
Hgb urine dipstick: NEGATIVE
Ketones, ur: NEGATIVE mg/dL
Leukocytes, UA: NEGATIVE
Nitrite: NEGATIVE
Protein, ur: NEGATIVE mg/dL
Specific Gravity, Urine: 1.025 (ref 1.005–1.030)
Urobilinogen, UA: 0.2 mg/dL (ref 0.0–1.0)
pH: 5.5 (ref 5.0–8.0)

## 2012-01-22 LAB — COMPREHENSIVE METABOLIC PANEL
ALT: 12 U/L (ref 0–53)
AST: 21 U/L (ref 0–37)
Albumin: 3.3 g/dL — ABNORMAL LOW (ref 3.5–5.2)
Alkaline Phosphatase: 78 U/L (ref 39–117)
BUN: 21 mg/dL (ref 6–23)
CO2: 30 mEq/L (ref 19–32)
Calcium: 9.2 mg/dL (ref 8.4–10.5)
Chloride: 101 mEq/L (ref 96–112)
Creatinine, Ser: 1.05 mg/dL (ref 0.50–1.35)
GFR calc Af Amer: 74 mL/min — ABNORMAL LOW (ref >90)
GFR calc non Af Amer: 64 mL/min — ABNORMAL LOW (ref >90)
Glucose, Bld: 92 mg/dL (ref 70–99)
Potassium: 5.1 mEq/L (ref 3.5–5.1)
Sodium: 138 mEq/L (ref 135–145)
Total Bilirubin: 0.3 mg/dL (ref 0.3–1.2)
Total Protein: 7.2 g/dL (ref 6.0–8.3)

## 2012-01-22 LAB — APTT: aPTT: 36 seconds (ref 24–37)

## 2012-01-22 LAB — SURGICAL PCR SCREEN
MRSA, PCR: NEGATIVE
Staphylococcus aureus: NEGATIVE

## 2012-01-22 LAB — PROTIME-INR
INR: 1.07 (ref 0.00–1.49)
Prothrombin Time: 13.8 seconds (ref 11.6–15.2)

## 2012-01-22 NOTE — Progress Notes (Signed)
01/22/12 1228  OBSTRUCTIVE SLEEP APNEA  Have you ever been diagnosed with sleep apnea through a sleep study? No  Do you snore loudly (loud enough to be heard through closed doors)?  0  Do you often feel tired, fatigued, or sleepy during the daytime? 1  Has anyone observed you stop breathing during your sleep? 0  Do you have, or are you being treated for high blood pressure? 1  BMI more than 35 kg/m2? 0  Age over 76 years old? 1  Neck circumference greater than 40 cm/18 inches? 0  Gender: 1  Obstructive Sleep Apnea Score 4   Score 4 or greater  Results sent to PCP

## 2012-01-22 NOTE — Patient Instructions (Addendum)
20      Your procedure is scheduled on:  Wednesday 01/27/2012 at 315 pm  Report to [facility] at 1245 pm  Call this number if you have problems the morning of surgery: [phone]   Remember:   Do not eat food after midnight! MAY HAVE CLEAR LIQUIDS FROM MIDNIGHT UP UNTIL 0915 AM THEN NOTHING UNTIL AFTER SURGERY!  Take these medicines the morning of surgery with A SIP OF WATER: Ativan if needed   Do not bring valuables to the hospital.  .  Leave suitcase in the car. After surgery it may be brought to your room.  For patients admitted to the hospital, checkout time is 11:00 AM the day of              Discharge.    Special Instructions: See [facility] Preparing  For Surgery Instruction Sheet.  Do not wear jewelry, lotions powders, perfumes. Women do not shave  legs or underarms for 12 hours before showers. Contacts, partial plates, or dentures may not be worn into surgery.                          Patients discharged the day of surgery will not be allowed to drive home. If going home the same day of surgery, must have someone stay with you first 24 hrs.at home and arrange for someone to drive you home from the Hospital. YOUR DRIVER IS :spouse Ricki Rodriguez               Please read over the following fact sheets that you were given: MRSA INFORMATION,INCENTIVE SPIROMETRY SHEET, SLEEP APNEA SHEET, Blood Transfusion sheet                            Telford Nab.Nanney,RN,BSN     [phone]

## 2012-01-27 ENCOUNTER — Encounter (HOSPITAL_COMMUNITY)
Admission: RE | Disposition: A | Discharge status: Home Health | Payer: Self-pay | Source: Ambulatory Visit | Attending: Orthopedic Surgery

## 2012-01-27 ENCOUNTER — Encounter (HOSPITAL_COMMUNITY): Payer: Self-pay | Admitting: *Deleted

## 2012-01-27 ENCOUNTER — Inpatient Hospital Stay (HOSPITAL_COMMUNITY): Payer: MEDICARE

## 2012-01-27 ENCOUNTER — Inpatient Hospital Stay (HOSPITAL_COMMUNITY)
Admission: RE | Admit: 2012-01-27 | Discharge: 2012-01-29 | DRG: 467 | Disposition: A | Discharge status: Home Health | Payer: MEDICARE | Source: Ambulatory Visit | Attending: Orthopedic Surgery | Admitting: Orthopedic Surgery

## 2012-01-27 ENCOUNTER — Inpatient Hospital Stay (HOSPITAL_COMMUNITY): Payer: MEDICARE | Admitting: Anesthesiology

## 2012-01-27 ENCOUNTER — Encounter (HOSPITAL_COMMUNITY): Payer: Self-pay | Admitting: Anesthesiology

## 2012-01-27 ENCOUNTER — Other Ambulatory Visit: Payer: Self-pay | Admitting: Orthopedic Surgery

## 2012-01-27 DIAGNOSIS — D62 Acute posthemorrhagic anemia: Secondary | ICD-10-CM

## 2012-01-27 DIAGNOSIS — Z96649 Presence of unspecified artificial hip joint: Secondary | ICD-10-CM

## 2012-01-27 DIAGNOSIS — T84018A Broken internal joint prosthesis, other site, initial encounter: Secondary | ICD-10-CM

## 2012-01-27 DIAGNOSIS — E871 Hypo-osmolality and hyponatremia: Secondary | ICD-10-CM

## 2012-01-27 DIAGNOSIS — F411 Generalized anxiety disorder: Secondary | ICD-10-CM | POA: Diagnosis present

## 2012-01-27 DIAGNOSIS — I10 Essential (primary) hypertension: Secondary | ICD-10-CM | POA: Diagnosis present

## 2012-01-27 DIAGNOSIS — M1610 Unilateral primary osteoarthritis, unspecified hip: Secondary | ICD-10-CM | POA: Diagnosis present

## 2012-01-27 DIAGNOSIS — M169 Osteoarthritis of hip, unspecified: Secondary | ICD-10-CM | POA: Diagnosis present

## 2012-01-27 DIAGNOSIS — Y831 Surgical operation with implant of artificial internal device as the cause of abnormal reaction of the patient, or of later complication, without mention of misadventure at the time of the procedure: Secondary | ICD-10-CM | POA: Diagnosis present

## 2012-01-27 DIAGNOSIS — T84029A Dislocation of unspecified internal joint prosthesis, initial encounter: Principal | ICD-10-CM | POA: Diagnosis present

## 2012-01-27 HISTORY — PX: TOTAL HIP REVISION: SHX763

## 2012-01-27 LAB — TYPE AND SCREEN
ABO/RH(D): O POS
Antibody Screen: NEGATIVE

## 2012-01-27 SURGERY — TOTAL HIP REVISION
Anesthesia: General | Site: Hip | Laterality: Left | Wound class: Clean

## 2012-01-27 MED ORDER — ONDANSETRON HCL 4 MG/2ML IJ SOLN: 4.0000 mg | Freq: Four times a day (QID) | INTRAMUSCULAR | Status: DC | PRN

## 2012-01-27 MED ORDER — GLYCOPYRROLATE 0.2 MG/ML IJ SOLN
INTRAMUSCULAR | Status: DC | PRN
  Administered 2012-01-27: 0.4 mg via INTRAVENOUS

## 2012-01-27 MED ORDER — METHOCARBAMOL 500 MG PO TABS
500.0000 mg | ORAL_TABLET | Freq: Four times a day (QID) | ORAL | Status: DC | PRN
  Administered 2012-01-27 – 2012-01-28 (×2): 500 mg via ORAL
  Filled 2012-01-27 (×2): qty 1

## 2012-01-27 MED ORDER — HYDROCHLOROTHIAZIDE 25 MG PO TABS
25.0000 mg | ORAL_TABLET | Freq: Every day | ORAL | Status: DC
  Administered 2012-01-28 – 2012-01-29 (×2): 25 mg via ORAL
  Filled 2012-01-27 (×2): qty 1

## 2012-01-27 MED ORDER — ACETAMINOPHEN 325 MG PO TABS: 650.0000 mg | ORAL_TABLET | Freq: Four times a day (QID) | ORAL | Status: DC | PRN

## 2012-01-27 MED ORDER — LOSARTAN POTASSIUM 50 MG PO TABS
50.0000 mg | ORAL_TABLET | Freq: Every day | ORAL | Status: DC
  Administered 2012-01-28 – 2012-01-29 (×2): 50 mg via ORAL
  Filled 2012-01-27 (×2): qty 1

## 2012-01-27 MED ORDER — CISATRACURIUM BESYLATE (PF) 10 MG/5ML IV SOLN
INTRAVENOUS | Status: DC | PRN
  Administered 2012-01-27: 10 mg via INTRAVENOUS

## 2012-01-27 MED ORDER — FENTANYL CITRATE 0.05 MG/ML IJ SOLN
INTRAMUSCULAR | Status: DC | PRN
  Administered 2012-01-27 (×3): 25 ug via INTRAVENOUS
  Administered 2012-01-27 (×3): 50 ug via INTRAVENOUS
  Administered 2012-01-27: 25 ug via INTRAVENOUS

## 2012-01-27 MED ORDER — DEXAMETHASONE 4 MG PO TABS
10.0000 mg | ORAL_TABLET | Freq: Once | ORAL | Status: AC
  Administered 2012-01-28: 10 mg via ORAL
  Filled 2012-01-27: qty 2.5

## 2012-01-27 MED ORDER — 0.9 % SODIUM CHLORIDE (POUR BTL) OPTIME
TOPICAL | Status: DC | PRN
  Administered 2012-01-27: 1000 mL

## 2012-01-27 MED ORDER — MORPHINE SULFATE 2 MG/ML IJ SOLN
1.0000 mg | INTRAMUSCULAR | Status: DC | PRN
  Administered 2012-01-27: 1 mg via INTRAVENOUS
  Filled 2012-01-27: qty 1

## 2012-01-27 MED ORDER — ACETAMINOPHEN 650 MG RE SUPP: 650.0000 mg | Freq: Four times a day (QID) | RECTAL | Status: DC | PRN

## 2012-01-27 MED ORDER — DIPHENHYDRAMINE HCL 12.5 MG/5ML PO ELIX: 12.5000 mg | ORAL_SOLUTION | ORAL | Status: DC | PRN

## 2012-01-27 MED ORDER — DOCUSATE SODIUM 100 MG PO CAPS
100.0000 mg | ORAL_CAPSULE | Freq: Two times a day (BID) | ORAL | Status: DC
  Administered 2012-01-27 – 2012-01-29 (×4): 100 mg via ORAL

## 2012-01-27 MED ORDER — BUPIVACAINE LIPOSOME 1.3 % IJ SUSP
INTRAMUSCULAR | Status: DC | PRN
  Administered 2012-01-27: 20 mL

## 2012-01-27 MED ORDER — MIRTAZAPINE 30 MG PO TABS
30.0000 mg | ORAL_TABLET | Freq: Every day | ORAL | Status: DC
  Administered 2012-01-27 – 2012-01-28 (×2): 30 mg via ORAL
  Filled 2012-01-27 (×3): qty 1

## 2012-01-27 MED ORDER — LOSARTAN POTASSIUM-HCTZ 50-12.5 MG PO TABS: 1.0000 | ORAL_TABLET | Freq: Every day | ORAL | Status: DC

## 2012-01-27 MED ORDER — HYDROMORPHONE HCL PF 1 MG/ML IJ SOLN
0.2500 mg | INTRAMUSCULAR | Status: DC | PRN
  Administered 2012-01-27 (×2): 0.25 mg via INTRAVENOUS
  Administered 2012-01-27: 0.5 mg via INTRAVENOUS
  Administered 2012-01-27: 0.25 mg via INTRAVENOUS

## 2012-01-27 MED ORDER — DEXAMETHASONE SODIUM PHOSPHATE 10 MG/ML IJ SOLN
10.0000 mg | Freq: Once | INTRAMUSCULAR | Status: AC
  Filled 2012-01-27: qty 1

## 2012-01-27 MED ORDER — DEXTROSE-NACL 5-0.9 % IV SOLN
INTRAVENOUS | Status: DC
  Administered 2012-01-27 – 2012-01-28 (×2): via INTRAVENOUS

## 2012-01-27 MED ORDER — PHENOL 1.4 % MT LIQD: 1.0000 | OROMUCOSAL | Status: DC | PRN

## 2012-01-27 MED ORDER — METOCLOPRAMIDE HCL 10 MG PO TABS: 5.0000 mg | ORAL_TABLET | Freq: Three times a day (TID) | ORAL | Status: DC | PRN

## 2012-01-27 MED ORDER — SODIUM CHLORIDE 0.9 % IV SOLN: INTRAVENOUS | Status: DC

## 2012-01-27 MED ORDER — OXYCODONE HCL 5 MG PO TABS
5.0000 mg | ORAL_TABLET | ORAL | Status: DC | PRN
  Administered 2012-01-27 – 2012-01-28 (×3): 5 mg via ORAL
  Administered 2012-01-29: 10 mg via ORAL
  Filled 2012-01-27 (×2): qty 1
  Filled 2012-01-27: qty 2
  Filled 2012-01-27: qty 1

## 2012-01-27 MED ORDER — POLYETHYLENE GLYCOL 3350 17 G PO PACK: 17.0000 g | PACK | Freq: Every day | ORAL | Status: DC | PRN

## 2012-01-27 MED ORDER — NEOSTIGMINE METHYLSULFATE 1 MG/ML IJ SOLN
INTRAMUSCULAR | Status: DC | PRN
  Administered 2012-01-27: 3 mg via INTRAVENOUS

## 2012-01-27 MED ORDER — LORAZEPAM 0.5 MG PO TABS
0.5000 mg | ORAL_TABLET | Freq: Every day | ORAL | Status: DC
  Administered 2012-01-27: 0.5 mg via ORAL
  Filled 2012-01-27: qty 1

## 2012-01-27 MED ORDER — ONDANSETRON HCL 4 MG PO TABS: 4.0000 mg | ORAL_TABLET | Freq: Four times a day (QID) | ORAL | Status: DC | PRN

## 2012-01-27 MED ORDER — SUCCINYLCHOLINE CHLORIDE 20 MG/ML IJ SOLN
INTRAMUSCULAR | Status: DC | PRN
  Administered 2012-01-27: 100 mg via INTRAVENOUS

## 2012-01-27 MED ORDER — METOCLOPRAMIDE HCL 5 MG/ML IJ SOLN: 5.0000 mg | Freq: Three times a day (TID) | INTRAMUSCULAR | Status: DC | PRN

## 2012-01-27 MED ORDER — MENTHOL 3 MG MT LOZG: 1.0000 | LOZENGE | OROMUCOSAL | Status: DC | PRN

## 2012-01-27 MED ORDER — PROMETHAZINE HCL 25 MG/ML IJ SOLN: 6.2500 mg | INTRAMUSCULAR | Status: DC | PRN

## 2012-01-27 MED ORDER — PROPOFOL 10 MG/ML IV EMUL
INTRAVENOUS | Status: DC | PRN
  Administered 2012-01-27: 130 mg via INTRAVENOUS

## 2012-01-27 MED ORDER — CHLORHEXIDINE GLUCONATE 4 % EX LIQD
60.0000 mL | Freq: Once | CUTANEOUS | Status: DC
  Filled 2012-01-27: qty 60

## 2012-01-27 MED ORDER — METHOCARBAMOL 100 MG/ML IJ SOLN
500.0000 mg | Freq: Four times a day (QID) | INTRAVENOUS | Status: DC | PRN
  Administered 2012-01-27: 500 mg via INTRAVENOUS
  Filled 2012-01-27: qty 5

## 2012-01-27 MED ORDER — BISACODYL 10 MG RE SUPP: 10.0000 mg | Freq: Every day | RECTAL | Status: DC | PRN

## 2012-01-27 MED ORDER — ONDANSETRON HCL 4 MG/2ML IJ SOLN
INTRAMUSCULAR | Status: DC | PRN
  Administered 2012-01-27 (×2): 2 mg via INTRAVENOUS

## 2012-01-27 MED ORDER — SODIUM CHLORIDE 0.9 % IJ SOLN
INTRAMUSCULAR | Status: DC | PRN
  Administered 2012-01-27: 50 mL

## 2012-01-27 MED ORDER — FLEET ENEMA 7-19 GM/118ML RE ENEM: 1.0000 | ENEMA | Freq: Once | RECTAL | Status: AC | PRN

## 2012-01-27 MED ORDER — BUPIVACAINE LIPOSOME 1.3 % IJ SUSP
20.0000 mL | Freq: Once | INTRAMUSCULAR | Status: DC
  Filled 2012-01-27: qty 20

## 2012-01-27 MED ORDER — ACETAMINOPHEN 10 MG/ML IV SOLN: 1000.0000 mg | Freq: Once | INTRAVENOUS | Status: DC

## 2012-01-27 MED ORDER — ACETAMINOPHEN 10 MG/ML IV SOLN
1000.0000 mg | Freq: Four times a day (QID) | INTRAVENOUS | Status: AC
  Administered 2012-01-27 – 2012-01-28 (×4): 1000 mg via INTRAVENOUS
  Filled 2012-01-27 (×7): qty 100

## 2012-01-27 MED ORDER — LIDOCAINE HCL (CARDIAC) 20 MG/ML IV SOLN
INTRAVENOUS | Status: DC | PRN
  Administered 2012-01-27: 50 mg via INTRAVENOUS

## 2012-01-27 MED ORDER — TRAMADOL HCL 50 MG PO TABS: 50.0000 mg | ORAL_TABLET | Freq: Four times a day (QID) | ORAL | Status: DC | PRN

## 2012-01-27 MED ORDER — CEFAZOLIN SODIUM-DEXTROSE 2-3 GM-% IV SOLR
2.0000 g | INTRAVENOUS | Status: AC
  Administered 2012-01-27: 2 g via INTRAVENOUS

## 2012-01-27 MED ORDER — LACTATED RINGERS IV SOLN
INTRAVENOUS | Status: DC | PRN
  Administered 2012-01-27 (×2): via INTRAVENOUS

## 2012-01-27 MED ORDER — EPHEDRINE SULFATE 50 MG/ML IJ SOLN
INTRAMUSCULAR | Status: DC | PRN
  Administered 2012-01-27: 10 mg via INTRAVENOUS

## 2012-01-27 MED ORDER — CEFAZOLIN SODIUM 1-5 GM-% IV SOLN
1.0000 g | Freq: Four times a day (QID) | INTRAVENOUS | Status: AC
  Administered 2012-01-27 – 2012-01-28 (×2): 1 g via INTRAVENOUS
  Filled 2012-01-27 (×2): qty 50

## 2012-01-27 MED ORDER — RIVAROXABAN 10 MG PO TABS
10.0000 mg | ORAL_TABLET | Freq: Every day | ORAL | Status: DC
  Administered 2012-01-28 – 2012-01-29 (×2): 10 mg via ORAL
  Filled 2012-01-27 (×3): qty 1

## 2012-01-27 SURGICAL SUPPLY — 61 items
BAG SPEC THK2 15X12 ZIP CLS (MISCELLANEOUS) ×3
BAG ZIPLOCK 12X15 (MISCELLANEOUS) ×6 IMPLANT
BIT DRILL 2.8X128 (BIT) ×2 IMPLANT
BLADE EXTENDED COATED 6.5IN (ELECTRODE) ×2 IMPLANT
BLADE SAW SAG 73X25 THK (BLADE) ×1
BLADE SAW SGTL 73X25 THK (BLADE) ×1 IMPLANT
CLOTH BEACON ORANGE TIMEOUT ST (SAFETY) ×2 IMPLANT
CONT SPECI 4OZ STER CLIK (MISCELLANEOUS) IMPLANT
DRAPE INCISE IOBAN 66X45 STRL (DRAPES) ×2 IMPLANT
DRAPE ORTHO SPLIT 77X108 STRL (DRAPES) ×4
DRAPE POUCH INSTRU U-SHP 10X18 (DRAPES) ×2 IMPLANT
DRAPE SURG ORHT 6 SPLT 77X108 (DRAPES) ×2 IMPLANT
DRAPE U-SHAPE 47X51 STRL (DRAPES) ×2 IMPLANT
DRSG EMULSION OIL 3X16 NADH (GAUZE/BANDAGES/DRESSINGS) ×2 IMPLANT
DRSG MEPILEX BORDER 4X4 (GAUZE/BANDAGES/DRESSINGS) ×2 IMPLANT
DRSG MEPILEX BORDER 4X8 (GAUZE/BANDAGES/DRESSINGS) ×2 IMPLANT
DURAPREP 26ML APPLICATOR (WOUND CARE) ×2 IMPLANT
ELECT REM PT RETURN 9FT ADLT (ELECTROSURGICAL) ×2
ELECTRODE REM PT RTRN 9FT ADLT (ELECTROSURGICAL) ×1 IMPLANT
EVACUATOR 1/8 PVC DRAIN (DRAIN) ×2 IMPLANT
FACESHIELD LNG OPTICON STERILE (SAFETY) ×8 IMPLANT
GLOVE BIO SURGEON STRL SZ8 (GLOVE) ×2 IMPLANT
GLOVE BIOGEL PI IND STRL 8 (GLOVE) ×2 IMPLANT
GLOVE BIOGEL PI INDICATOR 8 (GLOVE) ×2
GLOVE ECLIPSE 8.0 STRL XLNG CF (GLOVE) ×2 IMPLANT
GLOVE SURG SS PI 6.5 STRL IVOR (GLOVE) IMPLANT
GLOVE SURG SS PI 7.5 STRL IVOR (GLOVE) ×4 IMPLANT
GOWN STRL NON-REIN LRG LVL3 (GOWN DISPOSABLE) ×4 IMPLANT
GOWN STRL REIN XL XLG (GOWN DISPOSABLE) ×2 IMPLANT
HEAD OSTEO CTAPER L FIT (Orthopedic Implant) ×2 IMPLANT
HEAD OSTEO CTAPER L FIT 22 +5 (Orthopedic Implant) ×1 IMPLANT
IMMOBILIZER KNEE 20 (SOFTGOODS)
IMMOBILIZER KNEE 20 THIGH 36 (SOFTGOODS) IMPLANT
INSERT ACTB 54X22XCNSTRN HIP (Orthopedic Implant) IMPLANT
INSERT OSTEO ACET (Orthopedic Implant) ×2 IMPLANT
INSRT ACTB 54X22XCNSTRN HIP (Orthopedic Implant) ×1 IMPLANT
KIT BASIN OR (CUSTOM PROCEDURE TRAY) ×2 IMPLANT
MANIFOLD NEPTUNE II (INSTRUMENTS) ×2 IMPLANT
NDL SAFETY ECLIPSE 18X1.5 (NEEDLE) IMPLANT
NEEDLE HYPO 18GX1.5 SHARP (NEEDLE)
NS IRRIG 1000ML POUR BTL (IV SOLUTION) ×2 IMPLANT
PACK TOTAL JOINT (CUSTOM PROCEDURE TRAY) ×2 IMPLANT
PASSER SUT SWANSON 36MM LOOP (INSTRUMENTS) ×2 IMPLANT
POSITIONER SURGICAL ARM (MISCELLANEOUS) ×2 IMPLANT
SPONGE GAUZE 4X4 12PLY (GAUZE/BANDAGES/DRESSINGS) ×2 IMPLANT
SPONGE LAP 18X18 X RAY DECT (DISPOSABLE) ×2 IMPLANT
STAPLER VISISTAT 35W (STAPLE) ×2 IMPLANT
STRIP CLOSURE SKIN 1/2X4 (GAUZE/BANDAGES/DRESSINGS) ×2 IMPLANT
SUCTION FRAZIER TIP 10 FR DISP (SUCTIONS) ×2 IMPLANT
SUT ETHIBOND NAB CT1 #1 30IN (SUTURE) ×4 IMPLANT
SUT VIC AB 1 CT1 27 (SUTURE) ×6
SUT VIC AB 1 CT1 27XBRD ANTBC (SUTURE) ×3 IMPLANT
SUT VIC AB 2-0 CT1 27 (SUTURE) ×6
SUT VIC AB 2-0 CT1 TAPERPNT 27 (SUTURE) ×3 IMPLANT
SUT VLOC 180 0 24IN GS25 (SUTURE) ×4 IMPLANT
SWAB COLLECTION DEVICE MRSA (MISCELLANEOUS) IMPLANT
SYR 50ML LL SCALE MARK (SYRINGE) IMPLANT
TOWEL OR 17X26 10 PK STRL BLUE (TOWEL DISPOSABLE) ×4 IMPLANT
TRAY FOLEY CATH 14FRSI W/METER (CATHETERS) ×2 IMPLANT
TUBE ANAEROBIC SPECIMEN COL (MISCELLANEOUS) IMPLANT
WATER STERILE IRR 1500ML POUR (IV SOLUTION) ×2 IMPLANT

## 2012-01-27 NOTE — Brief Op Note (Signed)
01/27/2012  3:45 PM  PATIENT:  Tom Ortiz  78 y.o. male  PRE-OPERATIVE DIAGNOSIS:  Unstable left total hip arthroplasty  POST-OPERATIVE DIAGNOSIS:  Unstable left total hip arthroplasty  PROCEDURE:  Procedure(s) (LRB) with comments: TOTAL HIP REVISION (Left)  SURGEON:  Surgeon(s) and Role:    * Loanne Drilling, MD - Primary  PHYSICIAN ASSISTANT:   ASSISTANTS: Leilani Able, PA-C   ANESTHESIA:   general  EBL:  Total I/O In: -  Out: 150 [Urine:100; Blood:50]  BLOOD ADMINISTERED:none  DRAINS: (Medium) Hemovact drain(s) in the left hip with  Suction Open   LOCAL MEDICATIONS USED:  OTHER Exparel 20ml mixed with 50 ml saline  COUNTS:  YES  TOURNIQUET:  * No tourniquets in log *  DICTATION: .Other Dictation: Dictation Number [id number]  PLAN OF CARE: Admit to inpatient   PATIENT DISPOSITION:  PACU - hemodynamically stable.

## 2012-01-27 NOTE — H&P (View-Only) (Signed)
Tom Ortiz  DOB: 04/28/1927 Married / Language: English / Race: White Male  Date of Admission:  01/27/12  Chief Complaint:  Recurrent Dislocation of left hip  History of Present Illness The patient is a 78 year old male who comes in  for a preoperative History and Physical. The patient is scheduled for a left revision hip replacement - constrained liner to be performed by Dr. Frank V. Aluisio, MD at [facility] on 01/27/2012. He states the right hip is doing great at this time. He is very pleased with that. Unfortunately, he had another dislocation of the left hip. This is his 3rd or 4th dislocation. He had this reduced by Dr. Collins and he feels better now. He states he is frightened by any kind of movement. It is felt that he would best be served by convertin over to a constrained liner hip. He is ready to proceed with surgery. They have been treated conservatively in the past for the above stated problem and despite conservative measures, they continue to have progressive pain and severe functional limitations and dysfunction. They have failed non-operative management. It is felt that they would benefit from undergoing total joint replacement. Risks and benefits of the procedure have been discussed with the patient and they elect to proceed with surgery. There are no active contraindications to surgery such as ongoing infection or rapidly progressive neurological disease.     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 Rash.   Family History Father. Coronary artery disease. Heart Disease. father Depression. mother Mother. Cerebrovascular Accident. Rheumatoid Arthritis. father Osteoarthritis. father Hypertension. mother   Social History Drug/Alcohol Rehab (Currently). no Alcohol use. former drinker Current work status. retired Number of flights  of stairs before winded. less than 1 Children. 2 Drug/Alcohol Rehab (Previously). no Illicit drug use. no Living situation. live with spouse No alcohol use Tobacco use. Former smoker. former smoker; smoke(d) 1/2 pack(s) per day Pain Contract. no Tobacco / smoke exposure. no Exercise. Exercises weekly; does gym / weights Marital status. Married. Post-Surgical Plans. Plan is to go to [place].   Medication History LORazepam (0.5MG Tablet, Oral) Active. Losartan Potassium-HCTZ ( Oral) Specific dose unknown - Active. PredniSONE ( Oral) Specific dose unknown - Active. Remeron (30MG Tablet, Oral) Active.   Past Surgical History Total Hip Replacement. bilateral Tonsillectomy. Date: 1940. Appendectomy. Date: 1955. Hernia Repair. Date: 1990.   Medical history hip instability Depression Kidney Stone Tinnitus Rheumatoid Arthritis Osteoarthritis Anxiety Disorder   Review of Systems General:Not Present- Chills, Fever, Night Sweats, Fatigue, Weight Gain, Weight Loss and Memory Loss. Skin:Not Present- Hives, Itching, Rash, Eczema and Lesions. HEENT:Not Present- Tinnitus, Headache, Double Vision, Visual Loss, Hearing Loss and Dentures. Respiratory:Not Present- Shortness of breath with exertion, Shortness of breath at rest, Allergies, Coughing up blood and Chronic Cough. Cardiovascular:Not Present- Chest Pain, Racing/skipping heartbeats, Difficulty Breathing Lying Down, Murmur, Swelling and Palpitations. Gastrointestinal:Not Present- Bloody Stool, Heartburn, Abdominal Pain, Vomiting, Nausea, Constipation, Diarrhea, Difficulty Swallowing, Jaundice and Loss of appetitie. Male Genitourinary:Not Present- Urinary frequency, Blood in Urine, Weak urinary stream, Discharge, Flank Pain, Incontinence, Painful Urination, Urgency, Urinary Retention and Urinating at Night. Musculoskeletal:Present- Joint Pain and Back Pain. Not Present- Muscle Weakness, Muscle Pain,  Joint Swelling, Morning Stiffness and Spasms. Neurological:Not Present- Tremor, Dizziness, Blackout spells, Paralysis, Difficulty with balance and Weakness. Psychiatric:Not Present- Insomnia.   Vitals Weight: 177 lb Height: 70 in Weight was reported by patient. Height was reported by patient.   Body Surface Area: 1.99 m Body Mass Index: 25.4 kg/m Pulse: 80 (Regular) Resp.: 14 (Unlabored) BP: 132/74 (Sitting, Left Arm, Standard)    Physical Exam The physical exam findings are as follows:  Note: Patient is accompanied today by his wife on exam.   General Mental Status - Alert, cooperative and good historian. General Appearance- pleasant. Not in acute distress. Orientation- Oriented X3. Build & Nutrition- Well nourished and Well developed.   Head and Neck Head- normocephalic, atraumatic . Neck Global Assessment- supple. no bruit auscultated on the right and no bruit auscultated on the left.   Eye Pupil- Bilateral- Regular and Round. Note: wears glasses Motion- Bilateral- EOMI.   Chest and Lung Exam Auscultation: Breath sounds:- clear at anterior chest wall and - clear at posterior chest wall. Adventitious sounds:- No Adventitious sounds.   Cardiovascular Auscultation:Rhythm- Regular rate and rhythm. Heart Sounds- S1 WNL and S2 WNL. Murmurs & Other Heart Sounds:Auscultation of the heart reveals - No Murmurs.   Abdomen Palpation/Percussion:Tenderness- Abdomen is non-tender to palpation. Rigidity (guarding)- Abdomen is soft. Auscultation:Auscultation of the abdomen reveals - Bowel sounds normal.   Male Genitourinary   Note: Not done, not pertinent to present illness  Musculoskeletal   Note: He is alert and oriented. No apparent distress. His left hip can be flexed to about 100. Rotated in 10, out 20, abduct 20 without discomfort. Right hip flexion to 120, rotation in 30, out 40 and abduction to 40 without  discomfort.   RADIOGRAPHS: AP of the pelvis. This shows he has what appears to be an osteonic prosthesis with everything in decent position.  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 Aluisio.  Plan is for SNF.   Signed electronically by DREW L PERKINS, PA-C  

## 2012-01-27 NOTE — Anesthesia Postprocedure Evaluation (Signed)
  Anesthesia Post-op Note  Patient: Tom Ortiz  Procedure(s) Performed: Procedure(s) (LRB): TOTAL HIP REVISION (Left)  Patient Location: PACU  Anesthesia Type: General  Level of Consciousness: awake and alert   Airway and Oxygen Therapy: Patient Spontanous Breathing  Post-op Pain: mild  Post-op Assessment: Post-op Vital signs reviewed, Patient's Cardiovascular Status Stable, Respiratory Function Stable, Patent Airway and No signs of Nausea or vomiting  Post-op Vital Signs: stable  Complications: No apparent anesthesia complications

## 2012-01-27 NOTE — Transfer of Care (Signed)
Immediate Anesthesia Transfer of Care Note  Patient: Tom Ortiz  Procedure(s) Performed: Procedure(s) (LRB) with comments: TOTAL HIP REVISION (Left)  Patient Location: PACU  Anesthesia Type:General  Level of Consciousness: awake, alert , oriented and patient cooperative  Airway & Oxygen Therapy: Patient Spontanous Breathing and Patient connected to face mask oxygen  Post-op Assessment: Report given to PACU RN and Patient moving all extremities X 4  Post vital signs: Reviewed and stable  Complications: No apparent anesthesia complications

## 2012-01-27 NOTE — Transfer of Care (Signed)
Immediate Anesthesia Transfer of Care Note  Patient: Tom Ortiz  Procedure(s) Performed: Procedure(s) (LRB) with comments: TOTAL HIP REVISION (Left)  Patient Location: PACU  Anesthesia Type:General  Level of Consciousness: awake, alert , oriented and patient cooperative  Airway & Oxygen Therapy: Patient Spontanous Breathing and Patient connected to face mask oxygen  Post-op Assessment: Report given to PACU RN, Post -op Vital signs reviewed and stable and Patient moving all extremities X 4  Post vital signs: Reviewed and stable  Complications: No apparent anesthesia complications

## 2012-01-27 NOTE — H&P (Signed)
Tom Ortiz  DOB: Jun 25, 1927 Married / Language: English / Race: White Male  Date of Admission:  01/27/12  Chief Complaint:  Recurrent Dislocation of left hip  History of Present Illness The patient is a 78 year old male who comes in  for a preoperative History and Physical. The patient is scheduled for a left revision hip replacement - constrained liner to be performed by Dr. Gus Rankin. Aluisio, MD at [facility] on 01/27/2012. He states the right hip is doing great at this time. He is very pleased with that. Unfortunately, he had another dislocation of the left hip. This is his 3rd or 4th dislocation. He had this reduced by Dr. Thomasena Edis and he feels better now. He states he is frightened by any kind of movement. It is felt that he would best be served by convertin over to a constrained liner hip. He is ready to proceed with surgery. They have been treated conservatively in the past for the above stated problem and despite conservative measures, they continue to have progressive pain and severe functional limitations and dysfunction. They have failed non-operative management. It is felt that they would benefit from undergoing total joint replacement. Risks and benefits of the procedure have been discussed with the patient and they elect to proceed with surgery. There are no active contraindications to surgery such as ongoing infection or rapidly progressive neurological disease.     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 Rash.   Family History Father. Coronary artery disease. Heart Disease. father Depression. mother Mother. Cerebrovascular Accident. Rheumatoid Arthritis. father Osteoarthritis. father Hypertension. mother   Social History Drug/Alcohol Rehab (Currently). no Alcohol use. former drinker Current work status. retired Number of flights  of stairs before winded. less than 1 Children. 2 Drug/Alcohol Rehab (Previously). no Illicit drug use. no Living situation. live with spouse No alcohol use Tobacco use. Former smoker. former smoker; smoke(d) 1/2 pack(s) per day Pain Contract. no Tobacco / smoke exposure. no Exercise. Exercises weekly; does gym / weights Marital status. Married. Post-Surgical Plans. Plan is to go to [facility].   Medication History LORazepam (0.5MG  Tablet, Oral) Active. Losartan Potassium-HCTZ ( Oral) Specific dose unknown - Active. PredniSONE ( Oral) Specific dose unknown - Active. Remeron (30MG  Tablet, Oral) Active.   Past Surgical History Total Hip Replacement. bilateral Tonsillectomy. Date: 49. Appendectomy. Date: 48. Hernia Repair. Date: 21.   Medical history hip instability Depression Kidney Stone Tinnitus Rheumatoid Arthritis Osteoarthritis Anxiety Disorder   Review of Systems General:Not Present- Chills, Fever, Night Sweats, Fatigue, Weight Gain, Weight Loss and Memory Loss. Skin:Not Present- Hives, Itching, Rash, Eczema and Lesions. HEENT:Not Present- Tinnitus, Headache, Double Vision, Visual Loss, Hearing Loss and Dentures. Respiratory:Not Present- Shortness of breath with exertion, Shortness of breath at rest, Allergies, Coughing up blood and Chronic Cough. Cardiovascular:Not Present- Chest Pain, Racing/skipping heartbeats, Difficulty Breathing Lying Down, Murmur, Swelling and Palpitations. Gastrointestinal:Not Present- Bloody Stool, Heartburn, Abdominal Pain, Vomiting, Nausea, Constipation, Diarrhea, Difficulty Swallowing, Jaundice and Loss of appetitie. Male Genitourinary:Not Present- Urinary frequency, Blood in Urine, Weak urinary stream, Discharge, Flank Pain, Incontinence, Painful Urination, Urgency, Urinary Retention and Urinating at Night. Musculoskeletal:Present- Joint Pain and Back Pain. Not Present- Muscle Weakness, Muscle Pain,  Joint Swelling, Morning Stiffness and Spasms. Neurological:Not Present- Tremor, Dizziness, Blackout spells, Paralysis, Difficulty with balance and Weakness. Psychiatric:Not Present- Insomnia.   Vitals Weight: 177 lb Height: 70 in Weight was reported by patient. Height was reported by patient.  Body Surface Area: 1.99 m Body Mass Index: 25.4 kg/m Pulse: 80 (Regular) Resp.: 14 (Unlabored) BP: 132/74 (Sitting, Left Arm, Standard)    Physical Exam The physical exam findings are as follows:  Note: Patient is accompanied today by his wife on exam.   General Mental Status - Alert, cooperative and good historian. General Appearance- pleasant. Not in acute distress. Orientation- Oriented X3. Build & Nutrition- Well nourished and Well developed.   Head and Neck Head- normocephalic, atraumatic . Neck Global Assessment- supple. no bruit auscultated on the right and no bruit auscultated on the left.   Eye Pupil- Bilateral- Regular and Round. Note: wears glasses Motion- Bilateral- EOMI.   Chest and Lung Exam Auscultation: Breath sounds:- clear at anterior chest wall and - clear at posterior chest wall. Adventitious sounds:- No Adventitious sounds.   Cardiovascular Auscultation:Rhythm- Regular rate and rhythm. Heart Sounds- S1 WNL and S2 WNL. Murmurs & Other Heart Sounds:Auscultation of the heart reveals - No Murmurs.   Abdomen Palpation/Percussion:Tenderness- Abdomen is non-tender to palpation. Rigidity (guarding)- Abdomen is soft. Auscultation:Auscultation of the abdomen reveals - Bowel sounds normal.   Male Genitourinary   Note: Not done, not pertinent to present illness  Musculoskeletal   Note: He is alert and oriented. No apparent distress. His left hip can be flexed to about 100. Rotated in 10, out 20, abduct 20 without discomfort. Right hip flexion to 120, rotation in 30, out 40 and abduction to 40 without  discomfort.   RADIOGRAPHS: AP of the pelvis. This shows he has what appears to be an osteonic prosthesis with everything in decent position.  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 Lequita Halt.  Plan is for SNF.   Signed electronically by Roberts Gaudy, PA-C

## 2012-01-27 NOTE — Anesthesia Preprocedure Evaluation (Addendum)
Anesthesia Evaluation  Patient identified by MRN, date of birth, ID band Patient awake    Reviewed: Allergy & Precautions, H&P , NPO status , Patient's Chart, lab work & pertinent test results  Airway Mallampati: II TM Distance: >3 FB Neck ROM: Full    Dental No notable dental hx.    Pulmonary neg pulmonary ROS,  breath sounds clear to auscultation  Pulmonary exam normal       Cardiovascular hypertension, Pt. on medications Rhythm:Regular Rate:Normal     Neuro/Psych negative neurological ROS  negative psych ROS   GI/Hepatic negative GI ROS, Neg liver ROS,   Endo/Other  negative endocrine ROS  Renal/GU negative Renal ROS  negative genitourinary   Musculoskeletal negative musculoskeletal ROS (+)   Abdominal   Peds negative pediatric ROS (+)  Hematology negative hematology ROS (+)   Anesthesia Other Findings   Reproductive/Obstetrics negative OB ROS                           Anesthesia Physical Anesthesia Plan  ASA: II  Anesthesia Plan: General   Post-op Pain Management:    Induction: Intravenous  Airway Management Planned: Oral ETT  Additional Equipment:   Intra-op Plan:   Post-operative Plan:   Informed Consent: I have reviewed the patients History and Physical, chart, labs and discussed the procedure including the risks, benefits and alternatives for the proposed anesthesia with the patient or authorized representative who has indicated his/her understanding and acceptance.   Dental advisory given  Plan Discussed with: CRNA and Surgeon  Anesthesia Plan Comments:        Anesthesia Quick Evaluation

## 2012-01-27 NOTE — Interval H&P Note (Signed)
History and Physical Interval Note:  01/27/2012 2:29 PM  Tom Ortiz  has presented today for surgery, with the diagnosis of Unstable left total hip arthroplasty  The various methods of treatment have been discussed with the patient and family. After consideration of risks, benefits and other options for treatment, the patient has consented to  Procedure(s) (LRB) with comments: TOTAL HIP REVISION (Left) as a surgical intervention .  The patient's history has been reviewed, patient examined, no change in status, stable for surgery.  I have reviewed the patient's chart and labs.  Questions were answered to the patient's satisfaction.     Loanne Drilling

## 2012-01-28 ENCOUNTER — Encounter (HOSPITAL_COMMUNITY): Payer: Self-pay | Admitting: Orthopedic Surgery

## 2012-01-28 LAB — BASIC METABOLIC PANEL
BUN: 14 mg/dL (ref 6–23)
CO2: 28 mEq/L (ref 19–32)
Calcium: 7.8 mg/dL — ABNORMAL LOW (ref 8.4–10.5)
Chloride: 102 mEq/L (ref 96–112)
Creatinine, Ser: 0.88 mg/dL (ref 0.50–1.35)
GFR calc Af Amer: >90 mL/min (ref >90)
GFR calc non Af Amer: 78 mL/min — ABNORMAL LOW (ref >90)
Glucose, Bld: 118 mg/dL — ABNORMAL HIGH (ref 70–99)
Potassium: 3.5 mEq/L (ref 3.5–5.1)
Sodium: 136 mEq/L (ref 135–145)

## 2012-01-28 LAB — CBC
HCT: 30.6 % — ABNORMAL LOW (ref 39.0–52.0)
Hemoglobin: 10.2 g/dL — ABNORMAL LOW (ref 13.0–17.0)
MCH: 30.7 pg (ref 26.0–34.0)
MCHC: 33.3 g/dL (ref 30.0–36.0)
MCV: 92.2 fL (ref 78.0–100.0)
Platelets: 231 10*3/uL (ref 150–400)
RBC: 3.32 MIL/uL — ABNORMAL LOW (ref 4.22–5.81)
RDW: 14.1 % (ref 11.5–15.5)
WBC: 6.8 10*3/uL (ref 4.0–10.5)

## 2012-01-28 MED ORDER — LORAZEPAM 0.5 MG PO TABS
0.5000 mg | ORAL_TABLET | Freq: Two times a day (BID) | ORAL | Status: DC
  Administered 2012-01-28 – 2012-01-29 (×3): 0.5 mg via ORAL
  Filled 2012-01-28 (×3): qty 1

## 2012-01-28 NOTE — Op Note (Signed)
NAMEAAREON, DRASS NO.:  [id number]  MEDICAL RECORD NO.:  [id number]  LOCATION:  1602                         FACILITY:  [facility]  PHYSICIAN:  Ollen Gross, M.D.    DATE OF BIRTH:  [birth year]  DATE OF PROCEDURE:  01/27/2012 DATE OF DISCHARGE:                              OPERATIVE REPORT   PREOPERATIVE DIAGNOSIS:  Recurrent instability, left total hip arthroplasty.  POSTOPERATIVE DIAGNOSIS:  Recurrent instability, left total hip arthroplasty.  PROCEDURE:  Left hip acetabular revision to constrain liner.  SURGEON:  Ollen Gross, M.D.  ASSISTANT:  Jaquelyn Bitter. Chabon, P.A.  ANESTHESIA:  General.  ESTIMATED BLOOD LOSS:  Less than 100.  DRAINS:  Hemovac x1.  COMPLICATIONS:  None.  CONDITION:  Stable to recovery.  BRIEF CLINICAL NOTE:  Mr. Tom Ortiz is an 78 year old male who has had a series of dislocations of the left total hip arthroplasty, which was done approximately 15-20 years ago.  He has components, which appeared to be in good position, and there was some wear of the acetabular liner. He has a 22-mm head in place.  It was felt that given he has had recurrent instability with good position of the components that his interest will be served with revision surgery probably to a constrained liner versus acetabular component revision if the components were not in good position.  PROCEDURE IN DETAIL:  After successful administration of general anesthetic, the patient was placed in the right lateral decubitus position with the left side up and held with the hip positioner.  The left lower extremity was isolated from his perineum with plastic drapes and prepped and draped in usual sterile fashion.  Short posterolateral incision was made with a 10-blade through the subcutaneous tissue to the fascia lata, which was incised in line with the skin incision.  Sciatic nerve was palpated and protected.  Posterior pseudocapsule was excised off the femur.   Joint was entered.  The hip was dislocated posteriorly with about 60 degrees of flexion, 40 degrees of adduction and 20 degrees of internal rotation.  A 22-mm femoral head was removed from the femoral neck.  The femoral component was in good position.  The trunnion has minimal wear.  Femur was then retracted anteriorly to gain acetabular exposure.  The acetabular liner was removed from the acetabular shell. The shell was well fixed in excellent position.  There was some wear of the acetabular liner.  It was felt that since the components were well fixed and in good position that his interest will be best served by placing a constrained liner.  He had a 54-mm [employer] acetabular shell.  The 54-mm constrained liner was then impacted into the shell with excellent circumferential purchase.  I tested it and found that it was well fixed to the shell. The head removed with a 28+ 5 C taper femoral head.  The new 28+ 5 C taper femoral head was impacted onto the femoral neck.  The hip was then reduced into the tripolar component of the constrained liner.  The range of motion was full extension, about 70 degrees of flexion, 40 degrees of adduction and about 50 degrees of internal rotation, 90 degrees of flexion,  and 50 degrees of internal rotation.  I was able to flex to about 110 without impingement.  Wound was then copiously irrigated with saline solution and the posterior pseudocapsule reattached to the femur through drill holes with Ethibond suture.  Exparel was injected into the pseudocapsule, the gluteal muscles, ID band, and subcu tissues.  This was 20 mL of Exparel mixed with 50 mL of saline.  The fascia lata was then closed over a Hemovac drain with running #1 V-Loc suture.  Subcu was closed with interrupted 2-0 Vicryl and subcuticular running 4-0 Monocryl.  Incision was cleaned and dried and Steri-Strips and a bulky sterile dressing applied.  Drains hooked to suction and he was  placed into a knee immobilizer, awakened and transported to recovery in stable condition.     Ollen Gross, M.D.     FA/MEDQ  D:  01/27/2012  T:  01/28/2012  Job:  [id number]

## 2012-01-28 NOTE — Progress Notes (Signed)
Clinical Social Work Department BRIEF PSYCHOSOCIAL ASSESSMENT 01/28/2012  Patient:  Tom Ortiz, Tom Ortiz     Account Number:  [id number]     Admit date:  01/27/2012  Clinical Social Worker:  Candie Chroman  Date/Time:  01/28/2012 02:33 PM  Referred by:  Physician  Date Referred:  01/28/2012 Referred for  SNF Placement   Other Referral:   Interview type:  Patient Other interview type:    PSYCHOSOCIAL DATA Living Status:  WIFE Admitted from facility:   Level of care:   Primary support name:  Adriana Caccamo Primary support relationship to patient:  SPOUSE Degree of support available:   supportive    CURRENT CONCERNS Current Concerns  Post-Acute Placement   Other Concerns:    SOCIAL WORK ASSESSMENT / PLAN Pt is an 78 yr old gentleman living at home prior to hospitalization. CSW met with pt to assist with d/c planing needs. Pt hopes to return home with [facility] following hospitalization. If ST Rehab is needed pt has been to [facility] in the past and would like to return. SNF contacted and is able to admit Sat if rehab is needed.   Assessment/plan status:  Psychosocial Support/Ongoing Assessment of Needs Other assessment/ plan:   Information/referral to community resources:   none needed at this time.    PATIENT'S/FAMILY'S RESPONSE TO PLAN OF CARE: Pt is hopeful that he will return home , but will accept ST SNF placement at [facility] if necessary.    Cori Razor LCSW [phone]

## 2012-01-28 NOTE — Progress Notes (Signed)
Physical Therapy Treatment Note   01/28/12 1400  PT Visit Information  Last PT Received On 01/28/12  Assistance Needed +1  PT Time Calculation  PT Start Time 1358  PT Stop Time 1422  PT Time Calculation (min) 24 min  Subjective Data  Subjective It's feeling good.  I'll think I'll be ready to go tomorrow.  Precautions  Precautions Posterior Hip  Precaution Comments reviewed PHP again with pt  Restrictions  LLE Weight Bearing WBAT  Cognition  Overall Cognitive Status Appears within functional limits for tasks assessed/performed  Transfers  Transfers Stand to Sit;Sit to Stand  Sit to Stand 4: Min assist;With upper extremity assist;From chair/3-in-1  Stand to Sit To chair/3-in-1;4: Min assist;With upper extremity assist  Details for Transfer Assistance verbal cues for safe technique, L LE forward, hand placement, assist to rise and control descent  Ambulation/Gait  Ambulation/Gait Assistance 4: Min guard  Ambulation Distance (Feet) 60 Feet (x2)  Assistive device Rolling walker  Ambulation/Gait Assistance Details pt ambulated in hallway before and then after exercises in the recliner.  pt able to recall turning toward unaffected LE  Gait Pattern Step-to pattern;Antalgic  Exercises  Exercises Total Joint  Total Joint Exercises  Ankle Circles/Pumps AROM;20 reps;Both  [employer] reps;Both  Gluteal Sets AROM;20 reps;Both  Towel Squeeze AROM;20 reps;Both  Short Arc [employer];Left  Heel Slides AROM;20 reps;Other (comment);Left (within precautions)  Hip ABduction/ADduction AROM;20 reps;Left  Straight Leg Raises 15 reps;Left;AAROM  Long [employer] AROM;15 reps;Left  PT - End of Session  Activity Tolerance Patient tolerated treatment well  Patient left in chair;with call bell/phone within reach;with family/visitor present  PT - Assessment/Plan  Comments on Treatment Session Pt doing very well with mobility and performed exercises in recliner.  Reviewed hip precautions  with pt especially during mobility.  PT Plan Discharge plan remains appropriate;Frequency remains appropriate  Follow Up Recommendations Home health PT  Equipment Recommended None recommended by OT;None recommended by PT  Acute Rehab PT Goals  PT Goal: Sit to Stand - Progress Progressing toward goal  PT Goal: Stand to Sit - Progress Progressing toward goal  PT Goal: Ambulate - Progress Progressing toward goal  PT Goal: Perform Home Exercise Program - Progress Progressing toward goal  PT General Charges  $$ ACUTE PT VISIT 1 Procedure  PT Treatments  $Gait Training 8-22 mins  $Therapeutic Exercise 8-22 mins  Pt reports no pain at rest just sore incision site.   Zenovia Jarred, PT Pager: [phone]

## 2012-01-28 NOTE — Evaluation (Signed)
Physical Therapy Evaluation Patient Details Name: Tom Ortiz MRN: [id number] DOB: 1928/01/04 Today's Date: 01/28/2012 Time: 4098-1191 PT Time Calculation (min): 14 min  PT Assessment / Plan / Recommendation Clinical Impression  Pt s/p L hip acetabular revision.  Pt would benefit from acute PT services in order to improve independence with transfers, ambulation and stairs for safe d/c home with spouse.  Pt educated again on posterior hip precautions and handout posted in room.    PT Assessment  Patient needs continued PT services    Follow Up Recommendations  Home health PT    Does the patient have the potential to tolerate intense rehabilitation      Barriers to Discharge        Equipment Recommendations  None recommended by PT    Recommendations for Other Services     Frequency 7X/week    Precautions / Restrictions Precautions Precautions: Posterior Hip Precaution Comments: handout posted in room Restrictions LLE Weight Bearing: Weight bearing as tolerated   Pertinent Vitals/Pain Pt reports no pain only soreness from incision.  Ice applied, repositioned      Mobility  Bed Mobility Bed Mobility: Supine to Sit Supine to Sit: 4: Min assist;HOB elevated Details for Bed Mobility Assistance: verbal cues for safe technique, assist for L LE Transfers Transfers: Stand to Sit;Sit to Stand Sit to Stand: 4: Min assist;With upper extremity assist;From bed Stand to Sit: 4: Min assist;With upper extremity assist;To chair/3-in-1 Details for Transfer Assistance: verbal cues for safe technique, assist to rise and control descent Ambulation/Gait Ambulation/Gait Assistance: 4: Min guard Ambulation Distance (Feet): 120 Feet Assistive device: Rolling walker Ambulation/Gait Assistance Details: verbal cues for sequence, RW distance, step length, turning towards unaffected LE Gait Pattern: Step-to pattern;Antalgic    Shoulder Instructions     Exercises     PT Diagnosis:  Difficulty walking  PT Problem List: Decreased strength;Decreased activity tolerance;Decreased mobility;Decreased knowledge of precautions PT Treatment Interventions: DME instruction;Gait training;Stair training;Functional mobility training;Therapeutic activities;Therapeutic exercise;Patient/family education   PT Goals Acute Rehab PT Goals PT Goal Formulation: With patient Time For Goal Achievement: 02/04/12 Potential to Achieve Goals: Good Pt will go Supine/Side to Sit: with modified independence PT Goal: Supine/Side to Sit - Progress: Goal set today Pt will go Sit to Supine/Side: with modified independence PT Goal: Sit to Supine/Side - Progress: Goal set today Pt will go Sit to Stand: with modified independence PT Goal: Sit to Stand - Progress: Goal set today Pt will go Stand to Sit: with modified independence PT Goal: Stand to Sit - Progress: Goal set today Pt will Ambulate: 51 - 150 feet;with modified independence;with least restrictive assistive device PT Goal: Ambulate - Progress: Goal set today Pt will Go Up / Down Stairs: 3-5 stairs;with supervision;with rail(s) PT Goal: Up/Down Stairs - Progress: Goal set today Pt will Perform Home Exercise Program: with supervision, verbal cues required/provided PT Goal: Perform Home Exercise Program - Progress: Goal set today  Visit Information  Last PT Received On: 01/28/12 Assistance Needed: +1    Subjective Data  Subjective: This is my 12th time. (multiple hip dislocations/surgeries)   Prior Functioning  Home Living Lives With: Spouse Available Help at Discharge: Family Type of Home: House Home Access: Stairs to enter Secretary/administrator of Steps: 4 Entrance Stairs-Rails: Can reach both Home Layout: One level Bathroom Shower/Tub: Health visitor: Handicapped height Home Adaptive Equipment: Shower chair without back;Walker - rolling;Straight cane Prior Function Level of Independence: Independent with  assistive device(s) Able to Take Stairs?: Yes  Driving: Yes Vocation: Retired Musician: No difficulties Dominant Hand: Right    Cognition  Overall Cognitive Status: Appears within functional limits for tasks assessed/performed Arousal/Alertness: Awake/alert Orientation Level: Appears intact for tasks assessed Behavior During Session: [facility] for tasks performed    Extremity/Trunk Assessment Right Upper Extremity Assessment RUE ROM/Strength/Tone: [facility] for tasks assessed Left Upper Extremity Assessment LUE ROM/Strength/Tone: WFL for tasks assessed Right Lower Extremity Assessment RLE ROM/Strength/Tone: [facility] for tasks assessed Left Lower Extremity Assessment LLE ROM/Strength/Tone: Deficits LLE ROM/Strength/Tone Deficits: assist for bed mobility required   Balance    End of Session PT - End of Session Activity Tolerance: Patient tolerated treatment well Patient left: in chair;with call bell/phone within reach  GP     Tom Ortiz 01/28/2012, 12:24 PM Pager: [phone]

## 2012-01-28 NOTE — Progress Notes (Signed)
Utilization review completed.  

## 2012-01-28 NOTE — Progress Notes (Signed)
Clinical Social Work Department CLINICAL SOCIAL WORK PLACEMENT NOTE 01/28/2012  Patient:  RAINN, SHATTO  Account Number:  [id number] Admit date:  01/27/2012  Clinical Social Worker:  Cori Razor, LCSW  Date/time:  01/28/2012 02:21 PM  Clinical Social Work is seeking post-discharge placement for this patient at the following level of care:   SKILLED NURSING   (*CSW will update this form in Epic as items are completed)     Patient/family provided with [facility] Department of Clinical Social Work's list of facilities offering this level of care within the geographic area requested by the patient (or if unable, by the patient's family).    Patient/family informed of their freedom to choose among providers that offer the needed level of care, that participate in Medicare, Medicaid or managed care program needed by the patient, have an available bed and are willing to accept the patient.    Patient/family informed of [facility]' ownership interest in [facility], as well as of the fact that they are under no obligation to receive care at this facility.  PASARR submitted to [employer] on  PASARR number received from [employer] on 02/12/2008  FL2 transmitted to all facilities in geographic area requested by pt/family on  01/28/2012 FL2 transmitted to all facilities within larger geographic area on   Patient informed that his/her managed care company has contracts with or will negotiate with  certain facilities, including the following:     Patient/family informed of bed offers received:  01/28/2012 Patient chooses bed at [facility] Physician recommends and patient chooses bed at    Patient to be transferred to  on   Patient to be transferred to facility by   The following physician request were entered in Epic:   Additional Comments:  Cori Razor LCSW [phone]

## 2012-01-28 NOTE — Evaluation (Signed)
Occupational Therapy Evaluation Patient Details Name: Tom Ortiz MRN: [id number] DOB: 03-11-1928 Today's Date: 01/28/2012 Time: 7829-5621 OT Time Calculation (min): 27 min  OT Assessment / Plan / Recommendation Clinical Impression  Pt s/p L acetabular revision. Skilled OT recommended to maximize independence with BADLs to supervision level in prep for safe d/c home.    OT Assessment  Patient needs continued OT Services    Follow Up Recommendations  No OT follow up    Barriers to Discharge      Equipment Recommendations  None recommended by OT    Recommendations for Other Services    Frequency  Min 2X/week    Precautions / Restrictions Precautions Precautions: Posterior Hip Precaution Comments: handout posted in room Restrictions Weight Bearing Restrictions: No LLE Weight Bearing: Weight bearing as tolerated   Pertinent Vitals/Pain Reported 5/10 pain in L hip. Repositioned for comfort.    ADL  Grooming: Simulated;Min guard Where Assessed - Grooming: Supported standing Upper Body Bathing: Simulated;Set up Where Assessed - Upper Body Bathing: Unsupported sitting Lower Body Bathing: Simulated;Minimal assistance Where Assessed - Lower Body Bathing: Supported sit to stand Upper Body Dressing: Simulated;Set up Where Assessed - Upper Body Dressing: Unsupported sitting Lower Body Dressing: Simulated;Maximal assistance Where Assessed - Lower Body Dressing: Supported sit to stand Toilet Transfer: Performed;Min guard Statistician Method: Sit to Barista: Raised toilet seat with arms (or 3-in-1 over toilet) Toileting - Clothing Manipulation and Hygiene: Simulated;Minimal assistance Where Assessed - Engineer, mining and Hygiene: Sit to stand from 3-in-1 or toilet Tub/Shower Transfer: Performed;Min guard Tub/Shower Transfer Method: Science writer: Walk in Scientist, research (physical sciences) Used: Rolling walker;Gait  belt Transfers/Ambulation Related to ADLs: Pt ambulated to the bathroom with minguard A. Educated pt how to safely step into shower. ADL Comments: Pt stated he has been bending over to don clothing since 1st hip sx. REINFORCED hip precautions. Pt will need practice with AE.    OT Diagnosis: Generalized weakness  OT Problem List: Decreased activity tolerance;Decreased safety awareness;Decreased knowledge of use of DME or AE;Decreased knowledge of precautions OT Treatment Interventions: Self-care/ADL training;Therapeutic activities;DME and/or AE instruction;Patient/family education   OT Goals Acute Rehab OT Goals OT Goal Formulation: With patient Time For Goal Achievement: 02/04/12 Potential to Achieve Goals: Good ADL Goals Pt Will Perform Grooming: with supervision;Standing at sink ADL Goal: Grooming - Progress: Goal set today Pt Will Perform Lower Body Bathing: with supervision;Sit to stand from chair;Sit to stand from bed;with adaptive equipment ADL Goal: Lower Body Bathing - Progress: Goal set today Pt Will Perform Lower Body Dressing: with supervision;Sit to stand from chair;Sit to stand from bed;with adaptive equipment ADL Goal: Lower Body Dressing - Progress: Goal set today Pt Will Transfer to Toilet: with supervision;with DME;Ambulation;Maintaining hip precautions ADL Goal: Toilet Transfer - Progress: Goal set today Pt Will Perform Toileting - Clothing Manipulation: with supervision;Sitting on 3-in-1 or toilet;Standing ADL Goal: Toileting - Clothing Manipulation - Progress: Goal set today Pt Will Perform Toileting - Hygiene: with supervision;Sit to stand from 3-in-1/toilet ADL Goal: Toileting - Hygiene - Progress: Goal set today Pt Will Perform Tub/Shower Transfer: Shower transfer;with supervision;Ambulation ADL Goal: Tub/Shower Transfer - Progress: Goal set today  Visit Information  Last OT Received On: 01/28/12 Assistance Needed: +1    Subjective Data  Subjective: Alusio  told me I could bend over to tie my shoe... Patient Stated Goal: Not asked   Prior Functioning     Home Living Lives With: Spouse Available Help at Discharge: Family  Type of Home: House Home Access: Stairs to enter [employer] of Steps: 4 Entrance Stairs-Rails: Can reach both Home Layout: One level Bathroom Shower/Tub: Health visitor: Handicapped height Home Adaptive Equipment: Shower chair without back;Walker - rolling;Straight cane Prior Function Level of Independence: Independent with assistive device(s) Able to Take Stairs?: Yes Driving: Yes Vocation: Retired Musician: No difficulties Dominant Hand: Right         Vision/Perception     Cognition  Overall Cognitive Status: Appears within functional limits for tasks assessed/performed Arousal/Alertness: Awake/alert Orientation Level: Appears intact for tasks assessed Behavior During Session: [facility] for tasks performed    Extremity/Trunk Assessment Right Upper Extremity Assessment RUE ROM/Strength/Tone: [facility] for tasks assessed Left Upper Extremity Assessment LUE ROM/Strength/Tone: WFL for tasks assessed     Mobility  Transfers Sit to Stand: 4: Min guard;4: Min assist;With upper extremity assist;With armrests;From chair/3-in-1 Stand to Sit: 4: Min guard;4: Min assist;With upper extremity assist;With armrests;To chair/3-in-1 Details for Transfer Assistance: verbal cues for safe technique, assist to rise and control descent      Shoulder Instructions     Exercise     Balance     End of Session OT - End of Session Equipment Utilized During Treatment: Gait belt Activity Tolerance: Patient tolerated treatment well Patient left: in chair;with call bell/phone within reach  GO     PONTONERO,LAURIE A OTR/L [phone] 01/28/2012, 12:35 PM

## 2012-01-28 NOTE — Progress Notes (Signed)
   Subjective: 1 Day Post-Op Procedure(s) (LRB): TOTAL HIP REVISION (Left) Patient reports pain as mild.   Patient seen in rounds with Dr. Lequita Halt. Patient is well, and has had no acute complaints or problems We will start therapy today.  Plan is to go Home after hospital stay.  Objective: Vital signs in last 24 hours: Temp:  [96.5 F (35.8 C)-98.3 F (36.8 C)] 98.3 F (36.8 C) (11/14 0552) Pulse Rate:  [59-95] 72  (11/14 0552) Resp:  [13-20] 14  (11/14 0552) BP: (108-183)/(65-90) 108/65 mmHg (11/14 0552) SpO2:  [95 %-100 %] 99 % (11/14 0552) Weight:  [79.833 kg (176 lb)] 79.833 kg (176 lb) (11/13 1720)  Intake/Output from previous day:  Intake/Output Summary (Last 24 hours) at 01/28/12 0857 Last data filed at 01/28/12 7829  Gross per 24 hour  Intake   2685 ml  Output   1375 ml  Net   1310 ml    Intake/Output this shift: Total I/O In: -  Out: 50 [Urine:50]  Labs:  [facility] 01/28/12 0424  HGB 10.2*    Basename 01/28/12 0424  WBC 6.8  RBC 3.32*  HCT 30.6*  PLT 231    Basename 01/28/12 0424  NA 136  K 3.5  CL 102  CO2 28  BUN 14  CREATININE 0.88  GLUCOSE 118*  CALCIUM 7.8*   No results found for this basename: LABPT:2,INR:2 in the last 72 hours  EXAM General - Patient is Alert, Appropriate and Oriented Extremity - Neurovascular intact Sensation intact distally Dressing - dressing C/D/I Motor Function - intact, moving foot and toes well on exam.  Hemovac pulled without difficulty.  Past Medical History  Diagnosis Date  . Hypertension   . Anxiety   . Arthritis   . Hard of hearing     tinnitus both ears    Assessment/Plan: 1 Day Post-Op Procedure(s) (LRB): TOTAL HIP REVISION (Left) Active Problems:  * No active hospital problems. *   Estimated Body mass index is 25.25 kg/(m^2) as calculated from the following:   Height as of this encounter: 5\' 10" (1.778 m).   Weight as of this encounter: 176 lb(79.833 kg). Advance diet Up with  therapy Plan for discharge tomorrow Discharge home with home health  DVT Prophylaxis - Xarelto Weight Bearing As Tolerated left Leg D/C Knee Immobilizer Hemovac Pulled Begin Therapy Hip Preacutions No vaccines.  Tom Ortiz, Tom Ortiz 01/28/2012, 8:57 AM

## 2012-01-29 DIAGNOSIS — D62 Acute posthemorrhagic anemia: Secondary | ICD-10-CM

## 2012-01-29 LAB — BASIC METABOLIC PANEL
BUN: 18 mg/dL (ref 6–23)
CO2: 26 mEq/L (ref 19–32)
Calcium: 8.4 mg/dL (ref 8.4–10.5)
Chloride: 103 mEq/L (ref 96–112)
Creatinine, Ser: 1.12 mg/dL (ref 0.50–1.35)
GFR calc Af Amer: 69 mL/min — ABNORMAL LOW (ref >90)
GFR calc non Af Amer: 59 mL/min — ABNORMAL LOW (ref >90)
Glucose, Bld: 158 mg/dL — ABNORMAL HIGH (ref 70–99)
Potassium: 4.1 mEq/L (ref 3.5–5.1)
Sodium: 134 mEq/L — ABNORMAL LOW (ref 135–145)

## 2012-01-29 LAB — CBC
HCT: 30 % — ABNORMAL LOW (ref 39.0–52.0)
Hemoglobin: 10.1 g/dL — ABNORMAL LOW (ref 13.0–17.0)
MCH: 31 pg (ref 26.0–34.0)
MCHC: 33.7 g/dL (ref 30.0–36.0)
MCV: 92 fL (ref 78.0–100.0)
Platelets: 235 10*3/uL (ref 150–400)
RBC: 3.26 MIL/uL — ABNORMAL LOW (ref 4.22–5.81)
RDW: 14.2 % (ref 11.5–15.5)
WBC: 11.6 10*3/uL — ABNORMAL HIGH (ref 4.0–10.5)

## 2012-01-29 MED ORDER — RIVAROXABAN 10 MG PO TABS: 10.0000 mg | ORAL_TABLET | Freq: Every day | ORAL | Status: DC

## 2012-01-29 MED ORDER — OXYCODONE HCL 5 MG PO TABS: 5.0000 mg | ORAL_TABLET | ORAL | Status: DC | PRN

## 2012-01-29 MED ORDER — METHOCARBAMOL 500 MG PO TABS: 500.0000 mg | ORAL_TABLET | Freq: Four times a day (QID) | ORAL | Status: DC | PRN

## 2012-01-29 NOTE — Care Management Note (Signed)
    Page 1 of 2   01/29/2012     5:28:31 PM   CARE MANAGEMENT NOTE 01/29/2012  Patient:  Tom Ortiz, Tom Ortiz   Account Number:  [id number]  Date Initiated:  01/29/2012  Documentation initiated by:  Colleen Can  Subjective/Objective Assessment:   dx unstable left total hip arthroplasty; revision hip     Action/Plan:   CM spoke with patient and spouse. plans are for patient to return to his home in [place] where spouse will be caregiver. He already has DME from previous surgery.Genevieve Norlander will provide [facility]   Anticipated DC Date:  01/29/2012   Anticipated DC Plan:  HOME W HOME HEALTH SERVICES  In-house referral  Clinical Social Worker      DC Planning Services  CM consult      [facility] Choice  HOME HEALTH   Choice offered to / List presented to:  C-1 Patient   DME arranged  NA      DME agency  NA     HH arranged  HH-2 PT      [facility] agency  [facility]   Status of service:  Completed, signed off Medicare Important Message given?  NA - LOS <3 / Initial given by admissions (If response is "NO", the following Medicare IM given date fields will be blank) Date Medicare IM given:   Date Additional Medicare IM given:    Discharge Disposition:  HOME W HOME HEALTH SERVICES  Per UR Regulation:    If discussed at Long Length of Stay Meetings, dates discussed:    Comments:  01/29/2012 Dory Peru RN CCM [phone] Genevieve Norlander will start [facility] services tomorrow

## 2012-01-29 NOTE — Progress Notes (Signed)
Pt to d/c home. AVS reviewed and "My Chart" discussed with pt. Pt capable of verbalizing medications and follow-up appointments. Remains hemodynamically stable. No signs and symptoms of distress. Educated pt to return to ER in the case of SOB, dizziness, or chest pain.  

## 2012-01-29 NOTE — Discharge Summary (Signed)
Physician Discharge Summary   Patient ID: Tom Ortiz MRN: [id number] DOB/AGE: 09/01/1927 78 y.o.  Admit date: 01/27/2012 Discharge date: 01/29/2012  Primary Diagnosis: Recurrent instability, left total hip  Admission Diagnoses:  Past Medical History  Diagnosis Date  . Hypertension   . Anxiety   . Arthritis   . Hard of hearing     tinnitus both ears   Discharge Diagnoses:   Active Problems:  Postop Acute blood loss anemia  Estimated Body mass index is 25.25 kg/(m^2) as calculated from the following:   Height as of this encounter: 5\' 10" (1.778 m).   Weight as of this encounter: 176 lb(79.833 kg).  Classification of overweight in adults according to BMI (WHO, 1998)   Procedure: Procedure(s) (LRB): TOTAL HIP REVISION (Left)   Consults: None  HPI: Tom Ortiz is an 78 year old male who has had a  series of dislocations of the left total hip arthroplasty, which was  done approximately 15-20 years ago. He has components, which appeared  to be in good position, and there was some wear of the acetabular liner.  He has a 22-mm head in place. It was felt that given he has had  recurrent instability with good position of the components that his  interest will be served with revision surgery probably to a constrained  liner versus acetabular component revision if the components were not in  good position.  Laboratory Data: Admission on 01/27/2012  Component Date Value Range Status  . ABO/RH(D) 01/27/2012 O POS   Final  . Antibody Screen 01/27/2012 NEG   Final  . Sample Expiration 01/27/2012 01/30/2012   Final  . WBC 01/28/2012 6.8  4.0 - 10.5 K/uL Final  . RBC 01/28/2012 3.32* 4.22 - 5.81 MIL/uL Final  . Hemoglobin 01/28/2012 10.2* 13.0 - 17.0 g/dL Final  . HCT 91/47/8295 30.6* 39.0 - 52.0 % Final  . MCV 01/28/2012 92.2  78.0 - 100.0 fL Final  . MCH 01/28/2012 30.7  26.0 - 34.0 pg Final  . MCHC 01/28/2012 33.3  30.0 - 36.0 g/dL Final  . RDW 62/13/0865 14.1  11.5 -  15.5 % Final  . Platelets 01/28/2012 231  150 - 400 K/uL Final  . Sodium 01/28/2012 136  135 - 145 mEq/L Final  . Potassium 01/28/2012 3.5  3.5 - 5.1 mEq/L Final  . Chloride 01/28/2012 102  96 - 112 mEq/L Final  . CO2 01/28/2012 28  19 - 32 mEq/L Final  . Glucose, Bld 01/28/2012 118* 70 - 99 mg/dL Final  . BUN 78/46/9629 14  6 - 23 mg/dL Final  . Creatinine, Ser 01/28/2012 0.88  0.50 - 1.35 mg/dL Final  . Calcium 52/84/1324 7.8* 8.4 - 10.5 mg/dL Final  . GFR calc non Af Amer 01/28/2012 78* >90 mL/min Final  . GFR calc Af Amer 01/28/2012 >90  >90 mL/min Final   Comment:                                 The eGFR has been calculated                          using the CKD EPI equation.                          This calculation has not been  validated in all clinical                          situations.                          eGFR's persistently                          <90 mL/min signify                          possible Chronic Kidney Disease.  . WBC 01/29/2012 11.6* 4.0 - 10.5 K/uL Final  . RBC 01/29/2012 3.26* 4.22 - 5.81 MIL/uL Final  . Hemoglobin 01/29/2012 10.1* 13.0 - 17.0 g/dL Final  . HCT 16/12/9602 30.0* 39.0 - 52.0 % Final  . MCV 01/29/2012 92.0  78.0 - 100.0 fL Final  . MCH 01/29/2012 31.0  26.0 - 34.0 pg Final  . MCHC 01/29/2012 33.7  30.0 - 36.0 g/dL Final  . RDW 54/11/8117 14.2  11.5 - 15.5 % Final  . Platelets 01/29/2012 235  150 - 400 K/uL Final  . Sodium 01/29/2012 134* 135 - 145 mEq/L Final  . Potassium 01/29/2012 4.1  3.5 - 5.1 mEq/L Final  . Chloride 01/29/2012 103  96 - 112 mEq/L Final  . CO2 01/29/2012 26  19 - 32 mEq/L Final  . Glucose, Bld 01/29/2012 158* 70 - 99 mg/dL Final  . BUN 14/78/2956 18  6 - 23 mg/dL Final  . Creatinine, Ser 01/29/2012 1.12  0.50 - 1.35 mg/dL Final  . Calcium 21/30/8657 8.4  8.4 - 10.5 mg/dL Final  . GFR calc non Af Amer 01/29/2012 59* >90 mL/min Final  . GFR calc Af Amer 01/29/2012 69* >90 mL/min Final    Comment:                                 The eGFR has been calculated                          using the CKD EPI equation.                          This calculation has not been                          validated in all clinical                          situations.                          eGFR's persistently                          <90 mL/min signify                          possible Chronic Kidney Disease.  Hospital Outpatient Visit on 01/22/2012  Component Date Value Range Status  . MRSA, PCR 01/22/2012 NEGATIVE  NEGATIVE Final  . Staphylococcus aureus 01/22/2012 NEGATIVE  NEGATIVE Final   Comment:  The Xpert SA Assay (FDA                          approved for NASAL specimens                          in patients over 103 years of age),                          is one component of                          a comprehensive surveillance                          program.  Test performance has                          been validated by [employer] for patients greater                          than or equal to 38 year old.                          It is not intended                          to diagnose infection nor to                          guide or monitor treatment.  Marland Kitchen aPTT 01/22/2012 36  24 - 37 seconds Final  . WBC 01/22/2012 5.8  4.0 - 10.5 K/uL Final  . RBC 01/22/2012 4.18* 4.22 - 5.81 MIL/uL Final  . Hemoglobin 01/22/2012 12.6* 13.0 - 17.0 g/dL Final  . HCT 19/14/7829 38.8* 39.0 - 52.0 % Final  . MCV 01/22/2012 92.8  78.0 - 100.0 fL Final  . MCH 01/22/2012 30.1  26.0 - 34.0 pg Final  . MCHC 01/22/2012 32.5  30.0 - 36.0 g/dL Final  . RDW 56/21/3086 14.0  11.5 - 15.5 % Final  . Platelets 01/22/2012 303  150 - 400 K/uL Final  . Sodium 01/22/2012 138  135 - 145 mEq/L Final  . Potassium 01/22/2012 5.1  3.5 - 5.1 mEq/L Final  . Chloride 01/22/2012 101  96 - 112 mEq/L Final  . CO2 01/22/2012 30  19 - 32 mEq/L Final  .  Glucose, Bld 01/22/2012 92  70 - 99 mg/dL Final  . BUN 57/84/6962 21  6 - 23 mg/dL Final  . Creatinine, Ser 01/22/2012 1.05  0.50 - 1.35 mg/dL Final  . Calcium 95/28/4132 9.2  8.4 - 10.5 mg/dL Final  . Total Protein 01/22/2012 7.2  6.0 - 8.3 g/dL Final  . Albumin 44/03/270 3.3* 3.5 - 5.2 g/dL Final  . AST 53/66/4403 21  0 - 37 U/L Final  . ALT 01/22/2012 12  0 - 53 U/L Final  . Alkaline Phosphatase 01/22/2012 78  39 - 117 U/L Final  . Total Bilirubin 01/22/2012 0.3  0.3 - 1.2 mg/dL Final  . GFR  calc non Af Amer 01/22/2012 64* >90 mL/min Final  . GFR calc Af Amer 01/22/2012 74* >90 mL/min Final   Comment:                                 The eGFR has been calculated                          using the CKD EPI equation.                          This calculation has not been                          validated in all clinical                          situations.                          eGFR's persistently                          <90 mL/min signify                          possible Chronic Kidney Disease.  Marland Kitchen Prothrombin Time 01/22/2012 13.8  11.6 - 15.2 seconds Final  . INR 01/22/2012 1.07  0.00 - 1.49 Final  . Color, Urine 01/22/2012 YELLOW  YELLOW Final  . APPearance 01/22/2012 CLEAR  CLEAR Final  . Specific Gravity, Urine 01/22/2012 1.025  1.005 - 1.030 Final  . pH 01/22/2012 5.5  5.0 - 8.0 Final  . Glucose, UA 01/22/2012 NEGATIVE  NEGATIVE mg/dL Final  . Hgb urine dipstick 01/22/2012 NEGATIVE  NEGATIVE Final  . Bilirubin Urine 01/22/2012 NEGATIVE  NEGATIVE Final  . Ketones, ur 01/22/2012 NEGATIVE  NEGATIVE mg/dL Final  . Protein, ur 29/56/2130 NEGATIVE  NEGATIVE mg/dL Final  . Urobilinogen, UA 01/22/2012 0.2  0.0 - 1.0 mg/dL Final  . Nitrite 86/57/8469 NEGATIVE  NEGATIVE Final  . Leukocytes, UA 01/22/2012 NEGATIVE  NEGATIVE Final   MICROSCOPIC NOT DONE ON URINES WITH NEGATIVE PROTEIN, BLOOD, LEUKOCYTES, NITRITE, OR GLUCOSE <1000 mg/dL.     X-Rays:Dg Hip Complete  Left  01/22/2012  *RADIOLOGY REPORT*  Clinical Data: Preoperative assessment for left total hip replacement  LEFT HIP - COMPLETE 2+ VIEW  Comparison: 12/08/2011  Findings: Osseous demineralization. Acetabular and femoral components of bilateral total hip arthroplasties identified. No acute fracture or dislocation. No significant periprosthetic lucency. Pelvis intact.  IMPRESSION: Bilateral hip prostheses. Osseous demineralization. No acute abnormalities.   Original Report Authenticated By: Ulyses Southward, M.D.    Dg Pelvis Portable  01/27/2012  *RADIOLOGY REPORT*  Clinical Data: Left total hip prosthesis revision  PORTABLE PELVIS  Comparison: Radiographs dated 01/22/2012  Findings: A new femoral head component has been inserted and appears in good position in the AP projection.  No fractures. Right total hip prosthesis is unchanged.  IMPRESSION: Revision of left total hip prosthesis as described.   Original Report Authenticated By: Francene Boyers, M.D.     EKG: Orders placed during the hospital encounter of 07/01/11  . ED EKG  . ED EKG  . EKG 12-LEAD  . EKG 12-LEAD  . EKG 12-LEAD  .  EKG 12-LEAD  . EKG     Hospital Course: Patient was admitted to [facility] and taken to the OR and underwent the above state procedure without complications.  Patient tolerated the procedure well and was later transferred to the recovery room and then to the orthopaedic floor for postoperative care.  They were given PO and IV analgesics for pain control following their surgery.  They were given 24 hours of postoperative antibiotics of  Anti-infectives     Start     Dose/Rate Route Frequency Ordered Stop   01/27/12 2100   ceFAZolin (ANCEF) IVPB 1 g/50 mL premix        1 g 100 mL/hr over 30 Minutes Intravenous Every 6 hours 01/27/12 1736 01/28/12 0443   01/27/12 1325   ceFAZolin (ANCEF) IVPB 2 g/50 mL premix        2 g 100 mL/hr over 30 Minutes Intravenous 60 min pre-op 01/27/12 1325 01/27/12 1440          and started on DVT prophylaxis in the form of Xarelto.   PT and OT were ordered for total hip protocol.  The patient was allowed to be WBAT with therapy. Discharge planning was consulted to help with postop disposition and equipment needs.  Patient had a good night on the evening of surgery and started to get up OOB with therapy on day one. He walked over 100 feet. Hemovac drain was pulled without difficulty.  The knee immobilizer was removed and discontinued.  Continued to work with therapy into day two walking over 150 feet.  Dressing was changed on day two and the incision was healing well.  Patient was seen in rounds and was ready to go home later that day on POD 2.   Discharge Medications: Prior to Admission medications   Medication Sig Start Date End Date Taking? Authorizing Provider  acetaminophen (TYLENOL) 325 MG tablet Take 650 mg by mouth every 6 (six) hours as needed. Fever 10/05/11 10/04/12 Yes Alexzandrew Julien Girt, PA  LORazepam (ATIVAN) 0.5 MG tablet Take 0.5 mg by mouth at bedtime.   Yes Historical Provider, MD  losartan-hydrochlorothiazide (HYZAAR) 50-12.5 MG per tablet Take 1 tablet by mouth daily with breakfast.    Yes Historical Provider, MD  mirtazapine (REMERON) 30 MG tablet Take 30 mg by mouth at bedtime.   Yes Historical Provider, MD  predniSONE (DELTASONE) 5 MG tablet Take 5 mg by mouth once a week. As needed   Yes Historical Provider, MD  methocarbamol (ROBAXIN) 500 MG tablet Take 1 tablet (500 mg total) by mouth every 6 (six) hours as needed. 01/29/12   Alexzandrew Perkins, PA  oxyCODONE (OXY IR/ROXICODONE) 5 MG immediate release tablet Take 1-2 tablets (5-10 mg total) by mouth every 4 (four) hours as needed for pain. 01/29/12   Alexzandrew Julien Girt, PA  rivaroxaban (XARELTO) 10 MG TABS tablet Take 1 tablet (10 mg total) by mouth daily with breakfast. Take Xarelto for two and a half more weeks, then discontinue Xarelto. 01/29/12   Alexzandrew Julien Girt, PA    Diet: Regular  diet Activity:WBAT No bending hip over 90 degrees- A "L" Angle Do not cross legs Do not let foot roll inward When turning these patients a pillow should be placed between the patient's legs to prevent crossing. Patients should have the affected knee fully extended when trying to sit or stand from all surfaces to prevent excessive hip flexion. When ambulating and turning toward the affected side the affected leg should have the toes turned  out prior to moving the walker and the rest of patient's body as to prevent internal rotation/ turning in of the leg. Abduction pillows are the most effective way to prevent a patient from not crossing legs or turning toes in at rest. If an abduction pillow is not ordered placing a regular pillow length wise between the patient's legs is also an effective reminder. It is imperative that these precautions be maintained so that the surgical hip does not dislocate. Follow-up:in 2 weeks Disposition - Home Discharged Condition: good   Discharge Orders    Future Orders Please Complete By Expires   Diet general      Call MD / Call 911      Comments:   If you experience chest pain or shortness of breath, CALL 911 and be transported to the hospital emergency room.  If you develope a fever above 101 F, pus (white drainage) or increased drainage or redness at the wound, or calf pain, call your surgeon's office.   Discharge instructions      Comments:   Pick up stool softner and laxative for home. Do not submerge incision under water. May shower. Continue to use ice for pain and swelling from surgery.  Take Xarelto for two and a half more weeks, then discontinue Xarelto.   Constipation Prevention      Comments:   Drink plenty of fluids.  Prune juice may be helpful.  You may use a stool softener, such as Colace (over the counter) 100 mg twice a day.  Use MiraLax (over the counter) for constipation as needed.   Increase activity slowly as tolerated      Patient  may shower      Comments:   You may shower without a dressing once there is no drainage.  Do not wash over the wound.  If drainage remains, do not shower until drainage stops.   Weight bearing as tolerated      Driving restrictions      Comments:   No driving until released by the physician.   Lifting restrictions      Comments:   No lifting until released by the physician.   Follow the hip precautions as taught in Physical Therapy      Change dressing      Comments:   You may change your dressing dressing daily with sterile 4 x 4 inch gauze dressing and paper tape.  Do not submerge the incision under water.   TED hose      Comments:   Use stockings (TED hose) for 3 weeks on both leg(s).  You may remove them at night for sleeping.   Do not sit on low chairs, stoools or toilet seats, as it may be difficult to get up from low surfaces          Medication List     As of 01/29/2012  7:25 AM    TAKE these medications         acetaminophen 325 MG tablet   Commonly known as: TYLENOL   Take 650 mg by mouth every 6 (six) hours as needed. Fever      LORazepam 0.5 MG tablet   Commonly known as: ATIVAN   Take 0.5 mg by mouth at bedtime.      losartan-hydrochlorothiazide 50-12.5 MG per tablet   Commonly known as: HYZAAR   Take 1 tablet by mouth daily with breakfast.      methocarbamol 500 MG tablet   Commonly known as:  ROBAXIN   Take 1 tablet (500 mg total) by mouth every 6 (six) hours as needed.      mirtazapine 30 MG tablet   Commonly known as: REMERON   Take 30 mg by mouth at bedtime.      oxyCODONE 5 MG immediate release tablet   Commonly known as: Oxy IR/ROXICODONE   Take 1-2 tablets (5-10 mg total) by mouth every 4 (four) hours as needed for pain.      predniSONE 5 MG tablet   Commonly known as: DELTASONE   Take 5 mg by mouth once a week. As needed      rivaroxaban 10 MG Tabs tablet   Commonly known as: XARELTO   Take 1 tablet (10 mg total) by mouth daily with  breakfast. Take Xarelto for two and a half more weeks, then discontinue Xarelto.           Follow-up Information    Follow up with Loanne Drilling, MD. Schedule an appointment as soon as possible for a visit in 2 weeks.   Contact information:   [address], [address] [place] [address] [phone]          Signed: WRIGHT, GRAVELY 01/29/2012, 7:25 AM

## 2012-01-29 NOTE — Progress Notes (Signed)
Physical Therapy Treatment Patient Details Name: Tom Ortiz MRN: [id number] DOB: [birth year] Today's Date: 01/29/2012 Time: 1035-1100 PT Time Calculation (min): 25 min  PT Assessment / Plan / Recommendation Comments on Treatment Session  Pt ambulated, practiced stairs and performed exercises in preparation for d/c home with spouse today.  Also reviewed car transfer, posterior hip precautions thoroughly.    Follow Up Recommendations  Home health PT     Does the patient have the potential to tolerate intense rehabilitation     Barriers to Discharge        Equipment Recommendations  None recommended by OT;None recommended by PT    Recommendations for Other Services    Frequency     Plan Discharge plan remains appropriate;Frequency remains appropriate    Precautions / Restrictions Precautions Precautions: Posterior Hip Precaution Comments: reviewed PHP again with pt Restrictions LLE Weight Bearing: Weight bearing as tolerated   Pertinent Vitals/Pain Pt denies pain, just reports sore incision site.    Mobility  Transfers Transfers: Stand to Sit;Sit to Stand Sit to Stand: 4: Min guard;With upper extremity assist;From chair/3-in-1 Stand to Sit: 4: Min guard;To chair/3-in-1;With upper extremity assist Details for Transfer Assistance: verbal cues for safe technique, L LE forward, hand placement Ambulation/Gait Ambulation/Gait Assistance: 4: Min guard Ambulation Distance (Feet): 160 Feet Assistive device: Rolling walker Ambulation/Gait Assistance Details: verbal cues for RW distance and step length Gait Pattern: Step-through pattern;Antalgic Stairs: Yes Stairs Assistance: 4: Min guard Stairs Assistance Details (indicate cue type and reason): verbal cues for sequence Stair Management Technique: Two rails;Step to pattern;Forwards Number of Stairs: 2  (performed twice)    Exercises Total Joint Exercises Ankle Circles/Pumps: AROM;20 reps;Both Quad Sets: AROM;20  reps;Both Gluteal Sets: AROM;20 reps;Both Towel Squeeze: AROM;20 reps;Both Short Arc Quad: AROM;20 reps;Left Heel Slides: AROM;20 reps;Other (comment);Left (within precautions) Hip ABduction/ADduction: AROM;20 reps;Left Straight Leg Raises: 15 reps;Left;AAROM [employer]: AROM;15 reps;Left   PT Diagnosis:    PT Problem List:   PT Treatment Interventions:     PT Goals Acute Rehab PT Goals PT Goal: Sit to Stand - Progress: Progressing toward goal PT Goal: Stand to Sit - Progress: Progressing toward goal PT Goal: Ambulate - Progress: Progressing toward goal PT Goal: Up/Down Stairs - Progress: Progressing toward goal PT Goal: Perform Home Exercise Program - Progress: Progressing toward goal  Visit Information  Last PT Received On: 01/29/12 Assistance Needed: +1    Subjective Data  Subjective: You've been so nice to work with.   Cognition  Overall Cognitive Status: Appears within functional limits for tasks assessed/performed    Balance     End of Session PT - End of Session Activity Tolerance: Patient tolerated treatment well Patient left: in chair;with call bell/phone within reach;with family/visitor present   GP     LEMYRE,[name] 01/29/2012, 2:16 PM Pager: [phone]

## 2012-01-29 NOTE — Progress Notes (Signed)
OT Cancellation Note  Patient Details Name: Tom Ortiz MRN: [id number] DOB: Aug 30, 1927   Cancelled Treatment:    Reason Eval/Treat Not Completed: Other (comment) (pts refusal to participate)  PONTONERO,LAURIE A OTR/L [phone] 01/29/2012, 10:23 AM

## 2012-01-29 NOTE — Progress Notes (Signed)
   Subjective: 2 Days Post-Op Procedure(s) (LRB): TOTAL HIP REVISION (Left) Patient reports pain as mild.   Patient seen in rounds for Dr. Lequita Halt. Patient is well, and has had no acute complaints or problems Patient is ready to go home today.  Objective: Vital signs in last 24 hours: Temp:  [98 F (36.7 C)-98.9 F (37.2 C)] 98 F (36.7 C) (11/15 0600) Pulse Rate:  [73-93] 75  (11/15 0600) Resp:  [15-20] 20  (11/15 0600) BP: (115-138)/(60-67) 123/67 mmHg (11/15 0600) SpO2:  [91 %-97 %] 91 % (11/15 0600)  Intake/Output from previous day:  Intake/Output Summary (Last 24 hours) at 01/29/12 0720 Last data filed at 01/29/12 0600  Gross per 24 hour  Intake   1792 ml  Output   1600 ml  Net    192 ml    Intake/Output this shift:    Labs:  Basename 01/29/12 0417 01/28/12 0424  HGB 10.1* 10.2*    Basename 01/29/12 0417 01/28/12 0424  WBC 11.6* 6.8  RBC 3.26* 3.32*  HCT 30.0* 30.6*  PLT 235 231    Basename 01/29/12 0417 01/28/12 0424  NA 134* 136  K 4.1 3.5  CL 103 102  CO2 26 28  BUN 18 14  CREATININE 1.12 0.88  GLUCOSE 158* 118*  CALCIUM 8.4 7.8*   No results found for this basename: LABPT:2,INR:2 in the last 72 hours  EXAM: General - Patient is Alert, Appropriate and Oriented Extremity - Neurovascular intact Sensation intact distally Dorsiflexion/Plantar flexion intact No cellulitis present Incision - clean, dry, no drainage, healing Motor Function - intact, moving foot and toes well on exam.   Assessment/Plan: 2 Days Post-Op Procedure(s) (LRB): TOTAL HIP REVISION (Left) Procedure(s) (LRB): TOTAL HIP REVISION (Left) Past Medical History  Diagnosis Date  . Hypertension   . Anxiety   . Arthritis   . Hard of hearing     tinnitus both ears   Active Problems:  Postop Acute blood loss anemia  Estimated Body mass index is 25.25 kg/(m^2) as calculated from the following:   Height as of this encounter: 5\' 10" (1.778 m).   Weight as of this encounter:  176 lb(79.833 kg). Discharge home with home health Diet - Regular diet Follow up - in 2 weeks Activity - WBAT Disposition - Home Condition Upon Discharge - Good D/C Meds - See DC Summary DVT Prophylaxis - Xarelto  PERKINS, ALEXZANDREW 01/29/2012, 7:20 AM

## 2013-10-05 ENCOUNTER — Other Ambulatory Visit: Payer: Self-pay | Admitting: Internal Medicine

## 2013-10-05 DIAGNOSIS — R110 Nausea: Secondary | ICD-10-CM

## 2013-10-11 ENCOUNTER — Ambulatory Visit
Admission: RE | Admit: 2013-10-11 | Discharge: 2013-10-11 | Disposition: A | Discharge status: Home / Self Care | Payer: PRIVATE HEALTH INSURANCE | Source: Ambulatory Visit | Attending: Internal Medicine | Admitting: Internal Medicine

## 2013-10-11 DIAGNOSIS — R110 Nausea: Secondary | ICD-10-CM

## 2014-04-12 ENCOUNTER — Telehealth: Payer: Self-pay | Admitting: Cardiovascular Disease

## 2014-04-12 NOTE — Telephone Encounter (Signed)
Received records from [facility] (Dr Richmond Campbell) for appointment on 04/13/14 with Dr Sallyanne Kuster.  Records given to [facility] (medical records) for Dr Croitoru's schedule on 04/13/14. lp

## 2014-04-13 ENCOUNTER — Ambulatory Visit (INDEPENDENT_AMBULATORY_CARE_PROVIDER_SITE_OTHER): Payer: Medicare Other | Admitting: Cardiovascular Disease

## 2014-04-13 ENCOUNTER — Encounter: Payer: Self-pay | Admitting: Cardiovascular Disease

## 2014-04-13 VITALS — BP 128/82 | HR 85 | Resp 16 | Ht 70.0 in | Wt 172.7 lb

## 2014-04-13 DIAGNOSIS — I499 Cardiac arrhythmia, unspecified: Secondary | ICD-10-CM

## 2014-04-13 DIAGNOSIS — F419 Anxiety disorder, unspecified: Secondary | ICD-10-CM

## 2014-04-13 DIAGNOSIS — I10 Essential (primary) hypertension: Secondary | ICD-10-CM

## 2014-04-13 DIAGNOSIS — I4891 Unspecified atrial fibrillation: Secondary | ICD-10-CM

## 2014-04-13 DIAGNOSIS — R110 Nausea: Secondary | ICD-10-CM

## 2014-04-13 NOTE — Patient Instructions (Signed)
Your physician has requested that you have an echocardiogram. Echocardiography is a painless test that uses sound waves to create images of your heart. It provides your doctor with information about the size and shape of your heart and how well your heart's chambers and valves are working. This procedure takes approximately one hour. There are no restrictions for this procedure.  Your physician has recommended that you wear an event monitor. Event monitors are medical devices that record the heart's electrical activity. Doctors most often [place] these monitors to diagnose arrhythmias. Arrhythmias are problems with the speed or rhythm of the heartbeat. The monitor is a small, portable device. You can wear one while you do your normal daily activities. This is usually used to diagnose what is causing palpitations/syncope (passing out).  Follow up AS NEEDED with Dr.Croitoru

## 2014-04-16 ENCOUNTER — Encounter: Payer: Self-pay | Admitting: Cardiovascular Disease

## 2014-04-16 ENCOUNTER — Telehealth: Payer: Self-pay | Admitting: Cardiovascular Disease

## 2014-04-16 DIAGNOSIS — R110 Nausea: Secondary | ICD-10-CM | POA: Insufficient documentation

## 2014-04-16 DIAGNOSIS — I499 Cardiac arrhythmia, unspecified: Secondary | ICD-10-CM | POA: Insufficient documentation

## 2014-04-16 DIAGNOSIS — F419 Anxiety disorder, unspecified: Secondary | ICD-10-CM | POA: Insufficient documentation

## 2014-04-16 DIAGNOSIS — I10 Essential (primary) hypertension: Secondary | ICD-10-CM | POA: Insufficient documentation

## 2014-04-16 HISTORY — DX: Essential (primary) hypertension: I10

## 2014-04-16 MED ORDER — METOPROLOL SUCCINATE ER 25 MG PO TB24: 25.0000 mg | ORAL_TABLET | Freq: Every day | ORAL | Status: DC

## 2014-04-16 NOTE — Telephone Encounter (Signed)
Patient has new-onset AF, auto-trigger, average HR 129bpm 04/14/14 @ 12:44pm Tom Ortiz is faxing over monitor strips.   Patient was seen in office 04/13/14 for new-onset AF, referred by PCP Patient has echo scheduled for 04/19/14 Patient DOES NOT have follow up scheduled  Patient IS NOT on anticoagulation

## 2014-04-16 NOTE — Telephone Encounter (Signed)
Pt called in stating that he was just speaking with [place] about some medications and he had one more question to ask, should he continue to take the Losartan in addition to the other meds that were prescribed. Please call back  Thanks

## 2014-04-16 NOTE — Telephone Encounter (Signed)
Spoke with patient and communicated medication changes per DOD, Dr. Stanford Breed. Patient agreed with plan and voiced understanding of med changes. He is not symptomatic from his heart. He complains of his stomach being upset only. Patient will pick up medication from pharmacy today.   EKG and strips given to B. Lassiter to show to Dr. Loletha Grayer tomorrow 2/2

## 2014-04-16 NOTE — Progress Notes (Signed)
Patient ID: Tom Ortiz, male   DOB: September 19, 1927, 81 y.o.   MRN: [id number]      Reason for office visit Arrhythmia  Tom Ortiz is referred in consultation by Dr. Earlean Shawl after his physical exam is interested in irregular rhythm consistent with atrial fibrillation. An electrocardiogram was not performed.  The patient complains of shortness of breath and his wife describes him as being very inactive but these are not his biggest complaints. For roughly 6 years now he has had severe daily nausea that is typically worse when he wakes up in the morning and gradually improves as the day goes by. This is associated with extreme anxiety and a sensation of pulsation in his abdomen sometimes also pounding in his chest. Symptoms are not associated either with activity or with meals. He has lost about 15 pounds since last summer. He has undergone upper endoscopy and abdominal ultrasonography which have been unrevealing.  It is difficult to obtain a very detailed review of systems from him because he appears very anxious and repeatedly returns to his complaints of nausea no matter what the question. It seems that he has obtained relief from benzodiazepines in the past. He did inquire about prescribed this for him today.  The pounding sensation that he experienced yesterday, when his rhythm was regular, is still present today but his electrocardiogram shows normal sinus rhythm at this time.  He denies any chest pain either at rest or with activity or during his periods of nausea.  He has systemic hypertension with does not have other known risk factors for cardiac illness. He has intermittent arthralgias for which she will occasionally take prednisone 5 mg less frequently than weekly. He underwent a vasodilator nuclear perfusion study in 2012 that showed no evidence of ischemia, LVEF 62%.  Allergies  Allergen Reactions  . Quinolones Hives and Rash    81 yr old allergy to quinine    Current Outpatient  Prescriptions  Medication Sig Dispense Refill  . losartan-hydrochlorothiazide (HYZAAR) 50-12.5 MG per tablet Take 1 tablet by mouth daily with breakfast.     . predniSONE (DELTASONE) 5 MG tablet Take 5 mg by mouth once a week. As needed    . aspirin EC 325 MG tablet Take 325 mg by mouth daily.    . Cholecalciferol (VITAMIN D PO) Take 1 capsule by mouth daily.    . metoprolol succinate (TOPROL-XL) 25 MG 24 hr tablet Take 1 tablet (25 mg total) by mouth daily. 30 tablet 6   No current facility-administered medications for this visit.    Past Medical History  Diagnosis Date  . Hypertension   . Anxiety   . Arthritis   . Hard of hearing     tinnitus both ears    Past Surgical History  Procedure Laterality Date  . Hip closed reduction  07/01/2011, left    Procedure: [place];  Surgeon: Magnus Sinning, MD;  Location: [facility];  Service: Orthopedics;  Laterality: Left;  . Hip closed reduction  06/11/2011    Procedure: CLOSED MANIPULATION HIP;  Surgeon: Magnus Sinning, MD;  Location: [facility];  Service: Orthopedics;  Laterality: Left;  . Hip closed reduction  07/01/2011    Procedure: CLOSED MANIPULATION HIP;  Surgeon: Magnus Sinning, MD;  Location: [facility];  Service: Orthopedics;  Laterality: Left;  . Joint replacement  2007 right, left 17 yrs ago    Bilateral hip  . Appendectomy  yrs ago  . Hernia repair  yrs ago  .  Tonsillectomy  as child  . Total hip revision  10/02/2011    Procedure: TOTAL HIP REVISION;  Surgeon: Gearlean Alf, MD;  Location: [facility];  Service: Orthopedics;  Laterality: Right;  . Hip closed reduction  12/08/2011    Procedure: CLOSED MANIPULATION HIP;  Surgeon: Sydnee Cabal, MD;  Location: [facility];  Service: Orthopedics;  Laterality: Left;  . Total hip revision  01/27/2012    Procedure: TOTAL HIP REVISION;  Surgeon: Gearlean Alf, MD;  Location: [facility];  Service: Orthopedics;  Laterality: Left;    No family history on file.  History   Social  History  . Marital Status: Married    Spouse Name: N/A    Number of Children: N/A  . Years of Education: N/A   Occupational History  . Not on file.   Social History Main Topics  . Smoking status: Former Smoker -- 0.50 packs/day for 20 years    Types: Cigarettes  . Smokeless tobacco: Former Systems developer    Quit date: 01/21/1962  . Alcohol Use: No  . Drug Use: No  . Sexual Activity: No   Other Topics Concern  . Not on file   Social History Narrative    Review of systems: Severe daily nausea, palpitations associated with pounding in his abdomen, exertional dyspnea (but more prominently fatigue and dyspnea) The patient specifically denies any chest pain at rest or with exertion, dyspnea at rest, orthopnea, paroxysmal nocturnal dyspnea, syncope, palpitations, focal neurological deficits, intermittent claudication, lower extremity edema, unexplained weight gain, cough, hemoptysis or wheezing.  The patient also denies abdominal pain, dysphagia, diarrhea, constipation, polyuria, polydipsia, dysuria, hematuria, frequency, urgency, abnormal bleeding or bruising, fever, chills, unexpected weight changes, mood swings, change in skin or hair texture, change in voice quality, auditory or visual problems, allergic reactions or rashes, new musculoskeletal complaints other than usual "aches and pains".   PHYSICAL EXAM BP 128/82 mmHg  Pulse 85  Resp 16  Ht 5\' 10"  (1.778 m)  Wt 172 lb 11.2 oz (78.336 kg)  BMI 24.78 kg/m2  General: Alert, oriented x3, no distress Head: no evidence of trauma, PERRL, EOMI, no exophtalmos or lid lag, no myxedema, no xanthelasma; normal ears, nose and oropharynx Neck: normal jugular venous pulsations and no hepatojugular reflux; brisk carotid pulses without delay and no carotid bruits Chest: clear to auscultation, no signs of consolidation by percussion or palpation, normal fremitus, symmetrical and full respiratory excursions Cardiovascular: normal position and quality  of the apical impulse, regular rhythm, normal first and second heart sounds, no murmurs, rubs or gallops Abdomen: no tenderness or distention, no masses by palpation, no abnormal pulsatility or arterial bruits, normal bowel sounds, no hepatosplenomegaly Extremities: no clubbing, cyanosis or edema; 2+ radial, ulnar and brachial pulses bilaterally; 2+ right femoral, posterior tibial and dorsalis pedis pulses; 2+ left femoral, posterior tibial and dorsalis pedis pulses; no subclavian or femoral bruits Neurological: grossly nonfocal   EKG: Normal sinus rhythm  Lipid Panel  No results found for: CHOL, TRIG, HDL, CHOLHDL, VLDL, LDLCALC, LDLDIRECT  BMET    Component Value Date/Time   NA 134* 01/29/2012 0417   K 4.1 01/29/2012 0417   CL 103 01/29/2012 0417   CO2 26 01/29/2012 0417   GLUCOSE 158* 01/29/2012 0417   BUN 18 01/29/2012 0417   CREATININE 1.12 01/29/2012 0417   CALCIUM 8.4 01/29/2012 0417   GFRNONAA 59* 01/29/2012 0417   GFRAA 69* 01/29/2012 0417     ASSESSMENT AND PLAN I think it is likely that  we are dealing with more than 1 problem. It is quite possible that he has atrial fibrillation, but this needs to be from documented before he recommended treatment with anticoagulants and rate control medications.  Since his symptoms are present today, while he is in sinus rhythm, I doubt that they are explained by arrhythmia.  He clearly has problems with anxiety, although I'm not sure whether this is secondary to a yet undiagnosed organic problem or whether it is related to psychiatric difficulties.  His dyspnea also needs to be investigated. I have recommended that he wear a cardiac event monitor and have an echocardiogram, after which he'll follow-up in clinic Orders Placed This Encounter  Procedures  . EKG 12-Lead  . Cardiac event monitor  . 2D Echocardiogram without contrast   No orders of the defined types were placed in this encounter.    Holli Humbles,  MD, [place] [phone] office [phone] pager

## 2014-04-16 NOTE — Telephone Encounter (Signed)
Spoke with patient. Informed him no medication changes were made apart from adding aspirin 325mg  and toprol 73m daily. He states he takes vitamin D once daily (added to list). Informed patient to monitor for any lightheadedness/dizziness/fatigue and let us know (could be low BP r/t BP medications + B-Blocker). He voiced understanding

## 2014-04-16 NOTE — Telephone Encounter (Signed)
EKG from OV 1/29 located. EKG and Cardionet monitor strips shown to DOD, Dr. Stanford Breed  Orders received: START metoprolol succinate 25mg  daily. START aspirin 325mg  daily.   Show monitor to Dr. Sallyanne Kuster 04/17/14 - unsure of bleeding risks/need for NOAC?

## 2014-04-17 ENCOUNTER — Telehealth: Payer: Self-pay | Admitting: Cardiovascular Disease

## 2014-04-19 ENCOUNTER — Ambulatory Visit (HOSPITAL_COMMUNITY)
Admission: RE | Admit: 2014-04-19 | Discharge: 2014-04-19 | Disposition: A | Discharge status: Home / Self Care | Payer: Medicare Other | Source: Ambulatory Visit | Attending: Internal Medicine | Admitting: Internal Medicine

## 2014-04-19 DIAGNOSIS — I4891 Unspecified atrial fibrillation: Secondary | ICD-10-CM | POA: Insufficient documentation

## 2014-04-19 NOTE — Progress Notes (Signed)
2D Echocardiogram Complete.  04/19/2014   Bethany McMahill, RDCS

## 2014-04-25 NOTE — Telephone Encounter (Signed)
Closed encounter °

## 2014-04-27 ENCOUNTER — Ambulatory Visit (INDEPENDENT_AMBULATORY_CARE_PROVIDER_SITE_OTHER): Payer: Medicare Other | Admitting: Cardiovascular Disease

## 2014-04-27 ENCOUNTER — Encounter: Payer: Self-pay | Admitting: Cardiovascular Disease

## 2014-04-27 VITALS — BP 138/82 | HR 93 | Ht 70.0 in | Wt 173.7 lb

## 2014-04-27 DIAGNOSIS — I480 Paroxysmal atrial fibrillation: Secondary | ICD-10-CM

## 2014-04-27 DIAGNOSIS — I4891 Unspecified atrial fibrillation: Secondary | ICD-10-CM

## 2014-04-27 MED ORDER — RIVAROXABAN 20 MG PO TABS: 20.0000 mg | ORAL_TABLET | Freq: Every day | ORAL | Status: DC

## 2014-04-27 MED ORDER — METOPROLOL SUCCINATE ER 50 MG PO TB24: 50.0000 mg | ORAL_TABLET | Freq: Every day | ORAL | Status: DC

## 2014-04-27 NOTE — Patient Instructions (Addendum)
START Xarelto 20mg  daily take at dinner time.  STOP Losartan.  STOP Aspirin.  INCREASE Metoprolol to 50mg  daily.  Dr. Sallyanne Kuster recommends that you schedule a follow-up appointment in: One month.

## 2014-04-28 ENCOUNTER — Encounter: Payer: Self-pay | Admitting: Cardiovascular Disease

## 2014-04-28 DIAGNOSIS — I480 Paroxysmal atrial fibrillation: Secondary | ICD-10-CM | POA: Insufficient documentation

## 2014-04-28 HISTORY — DX: Paroxysmal atrial fibrillation: I48.0

## 2014-04-28 NOTE — Progress Notes (Signed)
Patient ID: Tom Ortiz, male   DOB: 14-Dec-1927, 81 y.o.   MRN: [id number]     Reason for office visit atrial fibrillation   Event monitor tracings very quickly showed that Dr. Liliane Channel clinical diagnosis of atrial fibrillation was very accurate. Tom Ortiz has had numerous episodes of atrial fib, sometimes several a day. He seems to have reasonable ventricular rate control at rest, but with activity his ventricular rate is 130-140 bpm and he recalls feeling "washed out" when trying to do some yard work while in AF.  There is no correlation between his daily morning nausea and the arrhythmia. In fact the AF seems more common in the afternoons. He is not aware of palpitations. He again denies chest pain, either during periods of recorded arrhythmia or otherwise.  His echo did not show any structural abnormalities. LVEF was normal, there were no valvular abnormalities and even the atrial size was normal.  At age 11 and with treated HTN, CHADSVasc score is 3. He has no history of bleeding.   Allergies  Allergen Reactions  . Quinolones Hives and Rash    81 yr old allergy to quinine    Current Outpatient Prescriptions  Medication Sig Dispense Refill  . Cholecalciferol (VITAMIN D PO) Take 1 capsule by mouth daily.    . predniSONE (DELTASONE) 5 MG tablet Take 5 mg by mouth once a week. As needed    . metoprolol succinate (TOPROL-XL) 50 MG 24 hr tablet Take 1 tablet (50 mg total) by mouth daily. 30 tablet 6  . rivaroxaban (XARELTO) 20 MG TABS tablet Take 1 tablet (20 mg total) by mouth daily with supper. 30 tablet 6   No current facility-administered medications for this visit.    Past Medical History  Diagnosis Date  . Hypertension   . Anxiety   . Arthritis   . Hard of hearing     tinnitus both ears  . Essential hypertension 04/16/2014    Past Surgical History  Procedure Laterality Date  . Hip closed reduction  07/01/2011, left    Procedure: [place];  Surgeon:  Magnus Sinning, MD;  Location: [facility];  Service: Orthopedics;  Laterality: Left;  . Hip closed reduction  06/11/2011    Procedure: CLOSED MANIPULATION HIP;  Surgeon: Magnus Sinning, MD;  Location: [facility];  Service: Orthopedics;  Laterality: Left;  . Hip closed reduction  07/01/2011    Procedure: CLOSED MANIPULATION HIP;  Surgeon: Magnus Sinning, MD;  Location: [facility];  Service: Orthopedics;  Laterality: Left;  . Joint replacement  2007 right, left 17 yrs ago    Bilateral hip  . Appendectomy  yrs ago  . Hernia repair  yrs ago  . Tonsillectomy  as child  . Total hip revision  10/02/2011    Procedure: TOTAL HIP REVISION;  Surgeon: Gearlean Alf, MD;  Location: [facility];  Service: Orthopedics;  Laterality: Right;  . Hip closed reduction  12/08/2011    Procedure: CLOSED MANIPULATION HIP;  Surgeon: Sydnee Cabal, MD;  Location: [facility];  Service: Orthopedics;  Laterality: Left;  . Total hip revision  01/27/2012    Procedure: TOTAL HIP REVISION;  Surgeon: Gearlean Alf, MD;  Location: [facility];  Service: Orthopedics;  Laterality: Left;    No family history on file.  History   Social History  . Marital Status: Married    Spouse Name: N/A  . Number of Children: N/A  . Years of Education: N/A   Occupational History  .  Not on file.   Social History Main Topics  . Smoking status: Former Smoker -- 0.50 packs/day for 20 years    Types: Cigarettes  . Smokeless tobacco: Former Systems developer    Quit date: 01/21/1962  . Alcohol Use: No  . Drug Use: No  . Sexual Activity: No   Other Topics Concern  . Not on file   Social History Narrative    Review of systems: Severe daily nausea, palpitations associated with pounding in his abdomen, exertional dyspnea (but more prominently fatigue and dyspnea) The patient specifically denies any chest pain at rest or with exertion, dyspnea at rest, orthopnea, paroxysmal nocturnal dyspnea, syncope, palpitations, focal neurological deficits, intermittent  claudication, lower extremity edema, unexplained weight gain, cough, hemoptysis or wheezing.  The patient also denies abdominal pain, dysphagia, diarrhea, constipation, polyuria, polydipsia, dysuria, hematuria, frequency, urgency, abnormal bleeding or bruising, fever, chills, unexpected weight changes, mood swings, change in skin or hair texture, change in voice quality, auditory or visual problems, allergic reactions or rashes, new musculoskeletal complaints other than usual "aches and pains".  PHYSICAL EXAM BP 138/82 mmHg  Pulse 93  Ht 5\' 10"  (1.778 m)  Wt 78.79 kg (173 lb 11.2 oz)  BMI 24.92 kg/m2 General: Alert, oriented x3, no distress Head: no evidence of trauma, PERRL, EOMI, no exophtalmos or lid lag, no myxedema, no xanthelasma; normal ears, nose and oropharynx Neck: normal jugular venous pulsations and no hepatojugular reflux; brisk carotid pulses without delay and no carotid bruits Chest: clear to auscultation, no signs of consolidation by percussion or palpation, normal fremitus, symmetrical and full respiratory excursions Cardiovascular: normal position and quality of the apical impulse, regular rhythm, normal first and second heart sounds, no murmurs, rubs or gallops Abdomen: no tenderness or distention, no masses by palpation, no abnormal pulsatility or arterial bruits, normal bowel sounds, no hepatosplenomegaly Extremities: no clubbing, cyanosis or edema; 2+ radial, ulnar and brachial pulses bilaterally; 2+ right femoral, posterior tibial and dorsalis pedis pulses; 2+ left femoral, posterior tibial and dorsalis pedis pulses; no subclavian or femoral bruits Neurological: grossly nonfocal   Lipid Panel  No results found for: CHOL, TRIG, HDL, CHOLHDL, VLDL, LDLCALC, LDLDIRECT  BMET    Component Value Date/Time   NA 134* 01/29/2012 0417   K 4.1 01/29/2012 0417   CL 103 01/29/2012 0417   CO2 26 01/29/2012 0417   GLUCOSE 158* 01/29/2012 0417   BUN 18 01/29/2012 0417    CREATININE 1.12 01/29/2012 0417   CALCIUM 8.4 01/29/2012 0417   GFRNONAA 59* 01/29/2012 0417   GFRAA 69* 01/29/2012 0417     ASSESSMENT AND PLAN  We discussed the relationship between atrial fibrillation  and ischemic stroke and the definite indication for anticoagulation. Reviewed pros and cons of warfarin versus NOAC - he clearly prefers the latter, although his wife is worried about the cost. Due to nausea problems, he will do best with a once daily NOAC in the evening, which is best matched by Xarelto, taken with the evening meal. Will also increase the metoprolol dose and stop his losartan to avoid excessive drop in BP. Reevaluate symptoms and BP in one month. Unfortunately, I do not think we are closer to establishing a diagnosis for his nausea, which is not temporally associated with his nausea. Watch for response to increased dose of beta blocker. Consider evaluation for CAD, although I think it is highly unlikely that his symptoms are an unusual expression of CAD.  Orders Placed This Encounter  Procedures  . EKG 12-Lead  Meds ordered this encounter  Medications  . metoprolol succinate (TOPROL-XL) 50 MG 24 hr tablet    Sig: Take 1 tablet (50 mg total) by mouth daily.    Dispense:  30 tablet    Refill:  6  . rivaroxaban (XARELTO) 20 MG TABS tablet    Sig: Take 1 tablet (20 mg total) by mouth daily with supper.    Dispense:  30 tablet    Refill:  Olney Croitoru, MD, [facility] [phone] office [phone] pager

## 2014-05-08 ENCOUNTER — Telehealth: Payer: Self-pay | Admitting: Cardiovascular Disease

## 2014-05-08 NOTE — Telephone Encounter (Signed)
Pt was wearing a monitor and have mailed it back. They keep leaving messages,that they have not received it. She wants to know if you have a phone number so she can call them. She does not know how to get in contact with them.

## 2014-05-08 NOTE — Telephone Encounter (Signed)
Spoke to [employer], they confirmed they received monitor in the mail. Pt was informed of this and voiced understanding. He stated no concerns or needs at this time.

## 2014-05-11 ENCOUNTER — Telehealth: Payer: Self-pay | Admitting: Cardiovascular Disease

## 2014-05-11 NOTE — Telephone Encounter (Signed)
Event Monitor coverage was denied by insurance-d/t "not being medically necessary".  Spent an hour on and off of hold and trying to get a number to fax information to. Was unable to get anything to help further where to send clinical information. Pt needs clinical information supporting his claim for needing the event monitor that had been ordered. Dr. Dani Gobble Croitoru's progress notes from 04/13/14 and 04/27/14 should be a good start for the patient to have to help his appeal and receive authorization approval for the "ECG Monitor"-Event Monitor.

## 2014-05-15 NOTE — Telephone Encounter (Signed)
Per [employer] did deny the Event Monitor but they will write the charge off.  Patient is not being billed.

## 2014-05-24 ENCOUNTER — Encounter: Payer: Self-pay | Admitting: *Deleted

## 2014-06-13 ENCOUNTER — Ambulatory Visit: Payer: Medicare Other | Admitting: Cardiovascular Disease

## 2014-07-09 ENCOUNTER — Ambulatory Visit: Payer: Medicare Other | Admitting: Cardiovascular Disease

## 2014-07-12 ENCOUNTER — Telehealth: Payer: Self-pay | Admitting: Hematology & Oncology

## 2014-07-12 NOTE — Telephone Encounter (Signed)
I spoke w NEW PATIENT today to remind them of their appointment with Dr. Ennever. Also, advised them to bring all medication bottles and insurance card information. ° °

## 2014-07-13 ENCOUNTER — Ambulatory Visit: Payer: Medicare Other

## 2014-07-13 ENCOUNTER — Encounter: Payer: Self-pay | Admitting: Hematology & Oncology

## 2014-07-13 ENCOUNTER — Ambulatory Visit (HOSPITAL_BASED_OUTPATIENT_CLINIC_OR_DEPARTMENT_OTHER)
Admission: RE | Admit: 2014-07-13 | Discharge: 2014-07-13 | Disposition: A | Discharge status: Home / Self Care | Payer: Medicare Other | Source: Ambulatory Visit | Attending: Hematology & Oncology | Admitting: Hematology & Oncology

## 2014-07-13 ENCOUNTER — Ambulatory Visit (HOSPITAL_BASED_OUTPATIENT_CLINIC_OR_DEPARTMENT_OTHER): Payer: Medicare Other

## 2014-07-13 ENCOUNTER — Other Ambulatory Visit: Payer: Self-pay

## 2014-07-13 ENCOUNTER — Ambulatory Visit (HOSPITAL_BASED_OUTPATIENT_CLINIC_OR_DEPARTMENT_OTHER): Payer: Medicare Other | Admitting: Hematology & Oncology

## 2014-07-13 VITALS — BP 147/99 | HR 100 | Temp 97.6°F | Resp 20 | Ht 68.0 in | Wt 158.0 lb

## 2014-07-13 DIAGNOSIS — D472 Monoclonal gammopathy: Secondary | ICD-10-CM | POA: Diagnosis not present

## 2014-07-13 DIAGNOSIS — R700 Elevated erythrocyte sedimentation rate: Secondary | ICD-10-CM | POA: Diagnosis not present

## 2014-07-13 DIAGNOSIS — M24011 Loose body in right shoulder: Secondary | ICD-10-CM | POA: Diagnosis not present

## 2014-07-13 DIAGNOSIS — M24012 Loose body in left shoulder: Secondary | ICD-10-CM | POA: Insufficient documentation

## 2014-07-13 DIAGNOSIS — M4854XA Collapsed vertebra, not elsewhere classified, thoracic region, initial encounter for fracture: Secondary | ICD-10-CM | POA: Insufficient documentation

## 2014-07-13 DIAGNOSIS — Z96643 Presence of artificial hip joint, bilateral: Secondary | ICD-10-CM | POA: Insufficient documentation

## 2014-07-13 DIAGNOSIS — M4850XA Collapsed vertebra, not elsewhere classified, site unspecified, initial encounter for fracture: Secondary | ICD-10-CM | POA: Diagnosis not present

## 2014-07-13 DIAGNOSIS — M545 Low back pain: Secondary | ICD-10-CM | POA: Diagnosis present

## 2014-07-13 DIAGNOSIS — C9000 Multiple myeloma not having achieved remission: Secondary | ICD-10-CM

## 2014-07-13 LAB — COMPREHENSIVE METABOLIC PANEL
ALT: 14 U/L (ref 0–53)
AST: 20 U/L (ref 0–37)
Albumin: 3.6 g/dL (ref 3.5–5.2)
Alkaline Phosphatase: 110 U/L (ref 39–117)
BUN: 17 mg/dL (ref 6–23)
CO2: 27 mEq/L (ref 19–32)
Calcium: 9.3 mg/dL (ref 8.4–10.5)
Chloride: 107 mEq/L (ref 96–112)
Creatinine, Ser: 1.04 mg/dL (ref 0.50–1.35)
Glucose, Bld: 97 mg/dL (ref 70–99)
Potassium: 4.9 mEq/L (ref 3.5–5.3)
Sodium: 143 mEq/L (ref 135–145)
Total Bilirubin: 0.6 mg/dL (ref 0.2–1.2)
Total Protein: 6.4 g/dL (ref 6.0–8.3)

## 2014-07-13 LAB — CBC WITH DIFFERENTIAL (CANCER CENTER ONLY)
BASO#: 0 10*3/uL (ref 0.0–0.2)
BASO%: 0.3 % (ref 0.0–2.0)
EOS%: 0.8 % (ref 0.0–7.0)
Eosinophils Absolute: 0.1 10*3/uL (ref 0.0–0.5)
HCT: 41.8 % (ref 38.7–49.9)
HGB: 13.8 g/dL (ref 13.0–17.1)
LYMPH#: 2.1 10*3/uL (ref 0.9–3.3)
LYMPH%: 27.4 % (ref 14.0–48.0)
MCH: 33.2 pg (ref 28.0–33.4)
MCHC: 33 g/dL (ref 32.0–35.9)
MCV: 101 fL — ABNORMAL HIGH (ref 82–98)
MONO#: 0.9 10*3/uL (ref 0.1–0.9)
MONO%: 11.6 % (ref 0.0–13.0)
NEUT#: 4.7 10*3/uL (ref 1.5–6.5)
NEUT%: 59.9 % (ref 40.0–80.0)
Platelets: 252 10*3/uL (ref 145–400)
RBC: 4.16 10*6/uL — ABNORMAL LOW (ref 4.20–5.70)
RDW: 13.9 % (ref 11.1–15.7)
WBC: 7.8 10*3/uL (ref 4.0–10.0)

## 2014-07-13 LAB — CHCC SATELLITE - SMEAR

## 2014-07-13 NOTE — Progress Notes (Signed)
Referral MD  Reason for Referral: IgG lambda MGUS  Chief Complaint  Patient presents with  . NEW PATIENT  : My back hurts quite a bit. I just don't feel well.  HPI: Tom Ortiz is a very fascinating 81 year old white gentleman. He is originally from [place]. He went to college at [employer]. 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 Zajkowski also served in the [employer] in [place].  He has had multiple, multiple hip surgeries. He's had back problems. He apparently had been on prednisone for many years.  He was having more back issues. He saw his orthopedist. He underwent an MRI. This was done on April 8. He had some chronic fractures of L4. Also noted were some fractures of L2 and L1. He had a lot of degenerative changes in the back. He had a moderate fracture at T11. Of course, the radiologist said this was a pathologic fracture as there were told that he had myeloma.  He has never had a diagnosis of myeloma. He did have some lab work done. He had a minimal monoclonal spike of 0.3 g/dL. This was found to be an IgG lambda spike. He had normal immunoglobulin levels. He had a 24-hour urine done which did not show any Bence Jones protein. His sedimentation rate was slightly elevated. He had a normal BUN and creatinine. He was not anemic.  However, he was told that he had myeloma and was referred to the [facility].  He comes in with his wife and daughter.  He is in quite a bit of pain because of compression fractures in his back. I'm surprised that no one has talked to him about vertebroplasty or kyphoplasty.  His weight is about the same. His appetite is okay.  He has not smoked for over 40 years. He does not drink.  Currently, his performance status is ECOG 2-3.   Past Medical History  Diagnosis Date  . Hypertension   . Anxiety   . Arthritis   . Hard of hearing      tinnitus both ears  . Essential hypertension 04/16/2014  . Paroxysmal atrial fibrillation 04/28/2014  :  Past Surgical History  Procedure Laterality Date  . Hip closed reduction  07/01/2011, left    Procedure: [place];  Surgeon: Magnus Sinning, MD;  Location: [facility];  Service: Orthopedics;  Laterality: Left;  . Hip closed reduction  06/11/2011    Procedure: CLOSED MANIPULATION HIP;  Surgeon: Magnus Sinning, MD;  Location: [facility];  Service: Orthopedics;  Laterality: Left;  . Hip closed reduction  07/01/2011    Procedure: CLOSED MANIPULATION HIP;  Surgeon: Magnus Sinning, MD;  Location: [facility];  Service: Orthopedics;  Laterality: Left;  . Joint replacement  2007 right, left 17 yrs ago    Bilateral hip  . Appendectomy  yrs ago  . Hernia repair  yrs ago  . Tonsillectomy  as child  . Total hip revision  10/02/2011    Procedure: TOTAL HIP REVISION;  Surgeon: Gearlean Alf, MD;  Location: [facility];  Service: Orthopedics;  Laterality: Right;  . Hip closed reduction  12/08/2011    Procedure: CLOSED MANIPULATION HIP;  Surgeon: Sydnee Cabal, MD;  Location: [facility];  Service: Orthopedics;  Laterality: Left;  . Total hip revision  01/27/2012    Procedure: TOTAL HIP REVISION;  Surgeon: Gearlean Alf, MD;  Location: [facility];  Service: Orthopedics;  Laterality: Left;  . Nm myoview ltd  2012  :   Current outpatient prescriptions:  .  acetaminophen (TYLENOL) 325 MG tablet, Take 650 mg by mouth as needed., Disp: , Rfl:  .  metoprolol succinate (TOPROL-XL) 50 MG 24 hr tablet, Take 1 tablet (50 mg total) by mouth daily. (Patient taking differently: Take 25 mg by mouth daily. TAKES 12.5 MG IN AM AND 12.5 MG IN PM ==25 MG DAILY), Disp: 30 tablet, Rfl: 6 .  oxyCODONE-acetaminophen (PERCOCET/ROXICET) 5-325 MG per tablet, Take by mouth every 8 (eight) hours as needed for severe pain., Disp: , Rfl: :  :  Allergies  Allergen Reactions  . Quinolones Hives and Rash    81 yr old allergy to  quinine  :  No family history on file.:  History   Social History  . Marital Status: Married    Spouse Name: N/A  . Number of Children: N/A  . Years of Education: N/A   Occupational History  . Not on file.   Social History Main Topics  . Smoking status: Former Smoker -- 0.50 packs/day for 20 years    Types: Cigarettes    Start date: 07/12/1973    Quit date: 07/12/1993  . Smokeless tobacco: Former Systems developer    Quit date: 01/21/1962     Comment: QUIT 21 YEARS AGP  . Alcohol Use: No  . Drug Use: No  . Sexual Activity: No   Other Topics Concern  . Not on file   Social History Narrative  :  Pertinent items are noted in HPI.  Exam: @IPVITALS @  elderly white gentleman in mild distress secondary to pain. Vital signs show temperature of 97.5. Pulse 102. Blood pressure 147/99. Weight is 158 pounds. Head and neck exam shows no ocular or oral lesions. There are no palpable cervical or supraclavicular lymph nodes. Lungs are clear. Cardiac exam regular rate and rhythm with no murmurs, rubs or bruits. Abdomen is soft. He has decent bowel sounds. There is no fluid wave. There is no palpable liver or spleen tip. Back exam shows moderate kyphosis. And was looks like he has some scoliosis. He has some slight tenderness in the lower thoracic and lumbar spine to palpation. Extremities shows osteophytic changes. Skin exam shows no rashes or atypical skin lesions. Neurological exam is nonfocal.    Recent Labs  07/13/14 1457  WBC 7.8  HGB 13.8  HCT 41.8  PLT 252   No results for input(s): NA, K, CL, CO2, GLUCOSE, BUN, CREATININE, CALCIUM in the last 72 hours.  Blood smear review: Normochromic and there was a population of red blood cells. There is no rouleau formation. There is no nucleated red cells. He has no schistocytes or spherocytes. Platelets are adequate in number and size. White blood cells show no hypersegmented polys. He has no atypical lymphocytes. There are no plasma  cells.  Pathology: None     Assessment and Plan: Mr. Aughenbaugh is an 81 year old gentleman. He has compression fractures in his back.  I just have a hard time believing that he has myeloma. He only has a 0.3 mg spike. He has normal immunoglobulin levels. He does not have any Bence-Jones or light chain protein in his urine. I cannot recall the last time that I saw a patient with myeloma that did not have light chains in his urine.  I think that the M spike is an MGUS. He is certainly entitled to have this given his age  and all of his bone problems and surgeries.  We will see what our studies show.  I got a bone survey on him. We will see what that shows. I will think that this will be normal.  I really believe that he would benefit from kyphoplasty. I think he probably needs to be referred to radiology for kyphoplasty evaluation. I'm not sure if orthopedic surgery does kyphoplasty anymore.  At that that he was on long-term prednisone probably is the biggest reason why he has the fractures and changes on the MRI.  I suspect that he probably has osteoporosis. He is not on vitamin D. I think pontes be on vitamin D. I think it probably would also benefit from Prolia or one of the other bisphosphonates.  I spent about an hour with he and his family. Again he is very interesting to talk to.  I just don't see that we have to do a bone marrow on him.  I will have his MRIs reviewed by radiology.  Again we will try to take care of a lot of issues over the phone so that he didn't have to rule out lateral office.  I answered all their questions.

## 2014-07-17 ENCOUNTER — Telehealth: Payer: Self-pay | Admitting: *Deleted

## 2014-07-17 LAB — KAPPA/LAMBDA LIGHT CHAINS
Kappa free light chain: 4.18 mg/dL — ABNORMAL HIGH (ref 0.33–1.94)
Kappa:Lambda Ratio: 0.42 (ref 0.26–1.65)
Lambda Free Lght Chn: 9.89 mg/dL — ABNORMAL HIGH (ref 0.57–2.63)

## 2014-07-17 LAB — PROTEIN ELECTROPHORESIS, SERUM, WITH REFLEX
Abnormal Protein Band1: 0.3 g/dL
Albumin ELP: 3.6 g/dL — ABNORMAL LOW (ref 3.8–4.8)
Alpha-1-Globulin: 0.3 g/dL (ref 0.2–0.3)
Alpha-2-Globulin: 0.8 g/dL (ref 0.5–0.9)
Beta 2: 0.3 g/dL (ref 0.2–0.5)
Beta Globulin: 0.4 g/dL (ref 0.4–0.6)
Gamma Globulin: 1 g/dL (ref 0.8–1.7)
Total Protein, Serum Electrophoresis: 6.3 g/dL (ref 6.1–8.1)

## 2014-07-17 LAB — LACTATE DEHYDROGENASE: LDH: 161 U/L (ref 94–250)

## 2014-07-17 LAB — IGG, IGA, IGM
IgA: 214 mg/dL (ref 68–379)
IgG (Immunoglobin G), Serum: 961 mg/dL (ref 650–1600)
IgM, Serum: 113 mg/dL (ref 41–251)

## 2014-07-17 LAB — IFE INTERPRETATION

## 2014-07-17 NOTE — Telephone Encounter (Signed)
-----   Message from Volanda Napoleon, MD sent at 07/16/2014  6:35 PM EDT ----- Call his daughter and tell her that the bone survey does not show any obvious myeloma in the bones. Thanks

## 2014-10-24 ENCOUNTER — Other Ambulatory Visit: Payer: Self-pay | Admitting: Cardiology

## 2014-10-25 NOTE — Telephone Encounter (Signed)
REFILL 

## 2015-01-02 DIAGNOSIS — M8580 Other specified disorders of bone density and structure, unspecified site: Secondary | ICD-10-CM | POA: Diagnosis not present

## 2015-01-02 DIAGNOSIS — Z23 Encounter for immunization: Secondary | ICD-10-CM | POA: Diagnosis not present

## 2015-01-02 DIAGNOSIS — M19011 Primary osteoarthritis, right shoulder: Secondary | ICD-10-CM | POA: Diagnosis not present

## 2015-01-02 DIAGNOSIS — M545 Low back pain: Secondary | ICD-10-CM | POA: Diagnosis not present

## 2015-01-02 DIAGNOSIS — M19012 Primary osteoarthritis, left shoulder: Secondary | ICD-10-CM | POA: Diagnosis not present

## 2015-01-02 DIAGNOSIS — M353 Polymyalgia rheumatica: Secondary | ICD-10-CM | POA: Diagnosis not present

## 2015-01-02 DIAGNOSIS — M2550 Pain in unspecified joint: Secondary | ICD-10-CM | POA: Diagnosis not present

## 2015-01-08 ENCOUNTER — Other Ambulatory Visit: Payer: Self-pay | Admitting: Physician Assistant

## 2015-01-08 ENCOUNTER — Other Ambulatory Visit: Payer: Self-pay | Admitting: Rheumatology

## 2015-01-08 DIAGNOSIS — M8580 Other specified disorders of bone density and structure, unspecified site: Secondary | ICD-10-CM

## 2015-02-12 ENCOUNTER — Ambulatory Visit
Admission: RE | Admit: 2015-02-12 | Discharge: 2015-02-12 | Disposition: A | Discharge status: Home / Self Care | Payer: Medicare Other | Source: Ambulatory Visit | Attending: Rheumatology | Admitting: Rheumatology

## 2015-02-12 DIAGNOSIS — M8580 Other specified disorders of bone density and structure, unspecified site: Secondary | ICD-10-CM

## 2015-04-18 DIAGNOSIS — M810 Age-related osteoporosis without current pathological fracture: Secondary | ICD-10-CM | POA: Diagnosis not present

## 2015-04-18 DIAGNOSIS — I129 Hypertensive chronic kidney disease with stage 1 through stage 4 chronic kidney disease, or unspecified chronic kidney disease: Secondary | ICD-10-CM | POA: Diagnosis not present

## 2015-04-25 DIAGNOSIS — I129 Hypertensive chronic kidney disease with stage 1 through stage 4 chronic kidney disease, or unspecified chronic kidney disease: Secondary | ICD-10-CM | POA: Diagnosis not present

## 2015-04-25 DIAGNOSIS — F339 Major depressive disorder, recurrent, unspecified: Secondary | ICD-10-CM | POA: Diagnosis not present

## 2015-04-25 DIAGNOSIS — F17211 Nicotine dependence, cigarettes, in remission: Secondary | ICD-10-CM | POA: Diagnosis not present

## 2015-04-25 DIAGNOSIS — N183 Chronic kidney disease, stage 3 (moderate): Secondary | ICD-10-CM | POA: Diagnosis not present

## 2015-04-25 DIAGNOSIS — M810 Age-related osteoporosis without current pathological fracture: Secondary | ICD-10-CM | POA: Diagnosis not present

## 2015-10-22 DIAGNOSIS — Z23 Encounter for immunization: Secondary | ICD-10-CM | POA: Diagnosis not present

## 2015-10-22 DIAGNOSIS — Z1389 Encounter for screening for other disorder: Secondary | ICD-10-CM | POA: Diagnosis not present

## 2015-10-22 DIAGNOSIS — I129 Hypertensive chronic kidney disease with stage 1 through stage 4 chronic kidney disease, or unspecified chronic kidney disease: Secondary | ICD-10-CM | POA: Diagnosis not present

## 2015-10-22 DIAGNOSIS — Z125 Encounter for screening for malignant neoplasm of prostate: Secondary | ICD-10-CM | POA: Diagnosis not present

## 2015-10-22 DIAGNOSIS — M810 Age-related osteoporosis without current pathological fracture: Secondary | ICD-10-CM | POA: Diagnosis not present

## 2015-10-22 DIAGNOSIS — I10 Essential (primary) hypertension: Secondary | ICD-10-CM | POA: Diagnosis not present

## 2015-10-22 DIAGNOSIS — E559 Vitamin D deficiency, unspecified: Secondary | ICD-10-CM | POA: Diagnosis not present

## 2015-10-22 DIAGNOSIS — Z0000 Encounter for general adult medical examination without abnormal findings: Secondary | ICD-10-CM | POA: Diagnosis not present

## 2015-10-29 DIAGNOSIS — I129 Hypertensive chronic kidney disease with stage 1 through stage 4 chronic kidney disease, or unspecified chronic kidney disease: Secondary | ICD-10-CM | POA: Diagnosis not present

## 2015-10-29 DIAGNOSIS — M810 Age-related osteoporosis without current pathological fracture: Secondary | ICD-10-CM | POA: Diagnosis not present

## 2015-10-29 DIAGNOSIS — F339 Major depressive disorder, recurrent, unspecified: Secondary | ICD-10-CM | POA: Diagnosis not present

## 2015-10-29 DIAGNOSIS — N183 Chronic kidney disease, stage 3 (moderate): Secondary | ICD-10-CM | POA: Diagnosis not present

## 2015-11-07 DIAGNOSIS — R269 Unspecified abnormalities of gait and mobility: Secondary | ICD-10-CM | POA: Diagnosis not present

## 2015-11-11 DIAGNOSIS — R269 Unspecified abnormalities of gait and mobility: Secondary | ICD-10-CM | POA: Diagnosis not present

## 2015-11-13 DIAGNOSIS — R269 Unspecified abnormalities of gait and mobility: Secondary | ICD-10-CM | POA: Diagnosis not present

## 2015-11-19 DIAGNOSIS — R269 Unspecified abnormalities of gait and mobility: Secondary | ICD-10-CM | POA: Diagnosis not present

## 2015-11-21 DIAGNOSIS — R269 Unspecified abnormalities of gait and mobility: Secondary | ICD-10-CM | POA: Diagnosis not present

## 2015-11-25 DIAGNOSIS — R269 Unspecified abnormalities of gait and mobility: Secondary | ICD-10-CM | POA: Diagnosis not present

## 2015-11-28 DIAGNOSIS — R269 Unspecified abnormalities of gait and mobility: Secondary | ICD-10-CM | POA: Diagnosis not present

## 2015-12-02 DIAGNOSIS — R269 Unspecified abnormalities of gait and mobility: Secondary | ICD-10-CM | POA: Diagnosis not present

## 2015-12-05 DIAGNOSIS — R269 Unspecified abnormalities of gait and mobility: Secondary | ICD-10-CM | POA: Diagnosis not present

## 2015-12-27 DIAGNOSIS — H2513 Age-related nuclear cataract, bilateral: Secondary | ICD-10-CM | POA: Diagnosis not present

## 2016-01-18 DIAGNOSIS — Z23 Encounter for immunization: Secondary | ICD-10-CM | POA: Diagnosis not present

## 2016-04-23 DIAGNOSIS — M810 Age-related osteoporosis without current pathological fracture: Secondary | ICD-10-CM | POA: Diagnosis not present

## 2016-04-23 DIAGNOSIS — I10 Essential (primary) hypertension: Secondary | ICD-10-CM | POA: Diagnosis not present

## 2016-04-23 DIAGNOSIS — I129 Hypertensive chronic kidney disease with stage 1 through stage 4 chronic kidney disease, or unspecified chronic kidney disease: Secondary | ICD-10-CM | POA: Diagnosis not present

## 2016-04-30 DIAGNOSIS — M545 Low back pain: Secondary | ICD-10-CM | POA: Diagnosis not present

## 2016-04-30 DIAGNOSIS — N183 Chronic kidney disease, stage 3 (moderate): Secondary | ICD-10-CM | POA: Diagnosis not present

## 2016-04-30 DIAGNOSIS — I129 Hypertensive chronic kidney disease with stage 1 through stage 4 chronic kidney disease, or unspecified chronic kidney disease: Secondary | ICD-10-CM | POA: Diagnosis not present

## 2016-04-30 DIAGNOSIS — M810 Age-related osteoporosis without current pathological fracture: Secondary | ICD-10-CM | POA: Diagnosis not present

## 2016-04-30 DIAGNOSIS — F339 Major depressive disorder, recurrent, unspecified: Secondary | ICD-10-CM | POA: Diagnosis not present

## 2016-07-11 ENCOUNTER — Inpatient Hospital Stay (HOSPITAL_COMMUNITY)
Admission: EM | Admit: 2016-07-11 | Discharge: 2016-07-16 | DRG: 481 | Disposition: A | Discharge status: Skilled Nursing Facility | Payer: Medicare Other | Attending: Internal Medicine | Admitting: Internal Medicine

## 2016-07-11 ENCOUNTER — Encounter (HOSPITAL_COMMUNITY): Payer: Self-pay | Admitting: Emergency Medicine

## 2016-07-11 ENCOUNTER — Emergency Department (HOSPITAL_COMMUNITY): Payer: Medicare Other

## 2016-07-11 DIAGNOSIS — S72142G Displaced intertrochanteric fracture of left femur, subsequent encounter for closed fracture with delayed healing: Secondary | ICD-10-CM | POA: Diagnosis not present

## 2016-07-11 DIAGNOSIS — Z96643 Presence of artificial hip joint, bilateral: Secondary | ICD-10-CM | POA: Diagnosis present

## 2016-07-11 DIAGNOSIS — R52 Pain, unspecified: Secondary | ICD-10-CM | POA: Diagnosis not present

## 2016-07-11 DIAGNOSIS — Z7982 Long term (current) use of aspirin: Secondary | ICD-10-CM | POA: Diagnosis not present

## 2016-07-11 DIAGNOSIS — S72112A Displaced fracture of greater trochanter of left femur, initial encounter for closed fracture: Secondary | ICD-10-CM | POA: Diagnosis present

## 2016-07-11 DIAGNOSIS — D649 Anemia, unspecified: Secondary | ICD-10-CM | POA: Diagnosis present

## 2016-07-11 DIAGNOSIS — M79605 Pain in left leg: Secondary | ICD-10-CM | POA: Diagnosis not present

## 2016-07-11 DIAGNOSIS — R339 Retention of urine, unspecified: Secondary | ICD-10-CM | POA: Diagnosis not present

## 2016-07-11 DIAGNOSIS — R278 Other lack of coordination: Secondary | ICD-10-CM | POA: Diagnosis not present

## 2016-07-11 DIAGNOSIS — S72002A Fracture of unspecified part of neck of left femur, initial encounter for closed fracture: Secondary | ICD-10-CM

## 2016-07-11 DIAGNOSIS — R2681 Unsteadiness on feet: Secondary | ICD-10-CM

## 2016-07-11 DIAGNOSIS — D472 Monoclonal gammopathy: Secondary | ICD-10-CM | POA: Diagnosis present

## 2016-07-11 DIAGNOSIS — W1830XA Fall on same level, unspecified, initial encounter: Secondary | ICD-10-CM | POA: Diagnosis present

## 2016-07-11 DIAGNOSIS — Z888 Allergy status to other drugs, medicaments and biological substances status: Secondary | ICD-10-CM

## 2016-07-11 DIAGNOSIS — T148XXA Other injury of unspecified body region, initial encounter: Secondary | ICD-10-CM | POA: Diagnosis not present

## 2016-07-11 DIAGNOSIS — Z87891 Personal history of nicotine dependence: Secondary | ICD-10-CM | POA: Diagnosis not present

## 2016-07-11 DIAGNOSIS — T402X5A Adverse effect of other opioids, initial encounter: Secondary | ICD-10-CM | POA: Diagnosis not present

## 2016-07-11 DIAGNOSIS — S72009A Fracture of unspecified part of neck of unspecified femur, initial encounter for closed fracture: Secondary | ICD-10-CM | POA: Diagnosis not present

## 2016-07-11 DIAGNOSIS — M9702XD Periprosthetic fracture around internal prosthetic left hip joint, subsequent encounter: Secondary | ICD-10-CM | POA: Diagnosis not present

## 2016-07-11 DIAGNOSIS — Z419 Encounter for procedure for purposes other than remedying health state, unspecified: Secondary | ICD-10-CM

## 2016-07-11 DIAGNOSIS — S72142A Displaced intertrochanteric fracture of left femur, initial encounter for closed fracture: Secondary | ICD-10-CM

## 2016-07-11 DIAGNOSIS — S299XXA Unspecified injury of thorax, initial encounter: Secondary | ICD-10-CM | POA: Diagnosis not present

## 2016-07-11 DIAGNOSIS — N179 Acute kidney failure, unspecified: Secondary | ICD-10-CM | POA: Diagnosis present

## 2016-07-11 DIAGNOSIS — G4700 Insomnia, unspecified: Secondary | ICD-10-CM | POA: Diagnosis not present

## 2016-07-11 DIAGNOSIS — W010XXA Fall on same level from slipping, tripping and stumbling without subsequent striking against object, initial encounter: Secondary | ICD-10-CM

## 2016-07-11 DIAGNOSIS — S79911A Unspecified injury of right hip, initial encounter: Secondary | ICD-10-CM | POA: Diagnosis not present

## 2016-07-11 DIAGNOSIS — H9190 Unspecified hearing loss, unspecified ear: Secondary | ICD-10-CM | POA: Diagnosis present

## 2016-07-11 DIAGNOSIS — E860 Dehydration: Secondary | ICD-10-CM | POA: Diagnosis present

## 2016-07-11 DIAGNOSIS — I480 Paroxysmal atrial fibrillation: Secondary | ICD-10-CM | POA: Diagnosis present

## 2016-07-11 DIAGNOSIS — M25552 Pain in left hip: Secondary | ICD-10-CM | POA: Diagnosis not present

## 2016-07-11 DIAGNOSIS — M9702XA Periprosthetic fracture around internal prosthetic left hip joint, initial encounter: Secondary | ICD-10-CM | POA: Diagnosis present

## 2016-07-11 DIAGNOSIS — S79912A Unspecified injury of left hip, initial encounter: Secondary | ICD-10-CM | POA: Diagnosis not present

## 2016-07-11 DIAGNOSIS — M6281 Muscle weakness (generalized): Secondary | ICD-10-CM | POA: Diagnosis not present

## 2016-07-11 DIAGNOSIS — I10 Essential (primary) hypertension: Secondary | ICD-10-CM | POA: Diagnosis present

## 2016-07-11 DIAGNOSIS — D62 Acute posthemorrhagic anemia: Secondary | ICD-10-CM | POA: Diagnosis not present

## 2016-07-11 DIAGNOSIS — Z96642 Presence of left artificial hip joint: Secondary | ICD-10-CM | POA: Diagnosis not present

## 2016-07-11 DIAGNOSIS — Z9181 History of falling: Secondary | ICD-10-CM | POA: Diagnosis not present

## 2016-07-11 DIAGNOSIS — K5900 Constipation, unspecified: Secondary | ICD-10-CM | POA: Diagnosis not present

## 2016-07-11 DIAGNOSIS — F419 Anxiety disorder, unspecified: Secondary | ICD-10-CM | POA: Diagnosis not present

## 2016-07-11 HISTORY — DX: Pain in left shoulder: M25.512

## 2016-07-11 LAB — CBC
HCT: 36.1 % — ABNORMAL LOW (ref 39.0–52.0)
Hemoglobin: 12.2 g/dL — ABNORMAL LOW (ref 13.0–17.0)
MCH: 32.8 pg (ref 26.0–34.0)
MCHC: 33.8 g/dL (ref 30.0–36.0)
MCV: 97 fL (ref 78.0–100.0)
Platelets: 185 10*3/uL (ref 150–400)
RBC: 3.72 MIL/uL — ABNORMAL LOW (ref 4.22–5.81)
RDW: 13.8 % (ref 11.5–15.5)
WBC: 7.4 10*3/uL (ref 4.0–10.5)

## 2016-07-11 LAB — BASIC METABOLIC PANEL
Anion gap: 6 (ref 5–15)
BUN: 20 mg/dL (ref 6–20)
CO2: 27 mmol/L (ref 22–32)
Calcium: 8.6 mg/dL — ABNORMAL LOW (ref 8.9–10.3)
Chloride: 104 mmol/L (ref 101–111)
Creatinine, Ser: 1.05 mg/dL (ref 0.61–1.24)
GFR calc Af Amer: >60 mL/min (ref >60)
GFR calc non Af Amer: >60 mL/min (ref >60)
Glucose, Bld: 113 mg/dL — ABNORMAL HIGH (ref 65–99)
Potassium: 4.5 mmol/L (ref 3.5–5.1)
Sodium: 137 mmol/L (ref 135–145)

## 2016-07-11 LAB — SURGICAL PCR SCREEN
MRSA, PCR: NEGATIVE
Staphylococcus aureus: NEGATIVE

## 2016-07-11 LAB — PROTIME-INR
INR: 1.2
Prothrombin Time: 15.2 seconds (ref 11.4–15.2)

## 2016-07-11 MED ORDER — ONDANSETRON HCL 4 MG/2ML IJ SOLN
4.0000 mg | Freq: Once | INTRAMUSCULAR | Status: AC
Start: 2016-07-11 — End: 2016-07-11
  Administered 2016-07-11: 4 mg via INTRAVENOUS
  Filled 2016-07-11: qty 2

## 2016-07-11 MED ORDER — HYDRALAZINE HCL 20 MG/ML IJ SOLN: 5.0000 mg | Freq: Four times a day (QID) | INTRAMUSCULAR | Status: DC | PRN

## 2016-07-11 MED ORDER — SODIUM CHLORIDE 0.9 % IV SOLN
INTRAVENOUS | Status: DC
  Administered 2016-07-11: 12:00:00 via INTRAVENOUS

## 2016-07-11 MED ORDER — METOPROLOL SUCCINATE ER 25 MG PO TB24
25.0000 mg | ORAL_TABLET | Freq: Two times a day (BID) | ORAL | Status: DC
  Administered 2016-07-11 – 2016-07-16 (×10): 25 mg via ORAL
  Filled 2016-07-11 (×10): qty 1

## 2016-07-11 MED ORDER — MORPHINE SULFATE (PF) 4 MG/ML IV SOLN
0.5000 mg | INTRAVENOUS | Status: DC | PRN
Start: 2016-07-11 — End: 2016-07-12
  Administered 2016-07-11: 19:00:00 0.52 mg via INTRAVENOUS
  Filled 2016-07-11 (×2): qty 1

## 2016-07-11 MED ORDER — HYDROMORPHONE HCL 1 MG/ML IJ SOLN
0.5000 mg | Freq: Once | INTRAMUSCULAR | Status: AC
  Administered 2016-07-11: 0.5 mg via INTRAVENOUS
  Filled 2016-07-11: qty 1

## 2016-07-11 MED ORDER — HYDROCODONE-ACETAMINOPHEN 5-325 MG PO TABS
1.0000 | ORAL_TABLET | Freq: Four times a day (QID) | ORAL | Status: DC | PRN
  Administered 2016-07-12 – 2016-07-14 (×6): 2 via ORAL
  Administered 2016-07-14: 1 via ORAL
  Administered 2016-07-15 – 2016-07-16 (×5): 2 via ORAL
  Filled 2016-07-11 (×10): qty 2
  Filled 2016-07-11: qty 1
  Filled 2016-07-11: qty 2

## 2016-07-11 MED ORDER — ENOXAPARIN SODIUM 30 MG/0.3ML ~~LOC~~ SOLN
30.0000 mg | SUBCUTANEOUS | Status: DC
  Administered 2016-07-11 – 2016-07-12 (×2): 30 mg via SUBCUTANEOUS
  Filled 2016-07-11 (×2): qty 0.3

## 2016-07-11 NOTE — H&P (Signed)
History and Physical    Tom Ortiz WGY:[id number] DOB: [birth year] DOA: 07/11/2016  Referring MD/NP/PA: Dr. Sallyanne Kuster (cardiology)  PCP: Thressa Sheller, MD   Patient coming from: Home  Chief Complaint: Left leg pain  HPI: Tom Ortiz is a 83 y.o. male with medical history significant for left hip instability and history of left total hip arthroplasty back in 2013, history of atrial fibrillation by family at the bedside was not sure whether patient was on a blood thinner or not. Based on Epic review looks like he used to be on xarelto but as mentioned family at the bedside does not remember that. I also asked about history of multiple myeloma and he has seen Dr. Marin Olp back in 2016, the issue at that time was that he has been having lot of hip problems and multiple hip surgeries. At that time the plan was for bone scan, vitamin D supplementation and bisphosphonates. I don't see any follow-up visits after that time and family cannot recall if they ever followed up for multiple myeloma. Patient presented to [facility] because he fell earlier today prior to the admission. He he reports that as he was walking the left leg just gave out on him and he fell pretty hard onto the floor on the left side. He is pain is 10 out of 10 in intensity from the left side of the hip radiating down to the leg. Pain is better with analgesia given in ED. He still has pain even at rest. No loss of consciousness at the time of the fall. No other complaints such as chest pain, palpitations or shortness of breath. No fevers or chills. No abdominal pain, nausea or vomiting.   ED Course: In ED, patient was hemodynamically stable. His blood pressure was 171/82 and we resumed home metoprolol. X-ray showed left greater trochanter fracture, orthopedic surgery consulted.  Review of Systems:  Constitutional: Negative for fever, chills, diaphoresis, activity change, appetite change and fatigue.  HENT: Negative for ear  pain, nosebleeds, congestion, facial swelling, rhinorrhea, neck pain, neck stiffness and ear discharge.   Eyes: Negative for pain, discharge, redness, itching and visual disturbance.  Respiratory: Negative for cough, choking, chest tightness, shortness of breath, wheezing and stridor.   Cardiovascular: Negative for chest pain, palpitations and leg swelling.  Gastrointestinal: Negative for abdominal distention.  Genitourinary: Negative for dysuria, urgency, frequency, hematuria, flank pain, decreased urine volume, difficulty urinating and dyspareunia.  Musculoskeletal: Per history of present illness Neurological: Negative for dizziness, tremors, seizures, syncope, facial asymmetry, speech difficulty, weakness, light-headedness, numbness and headaches.  Hematological: Negative for adenopathy. Does not bruise/bleed easily.  Psychiatric/Behavioral: Negative for hallucinations, behavioral problems, confusion, dysphoric mood, decreased concentration and agitation.   Past Medical History:  Diagnosis Date  . Anxiety   . Arthritis   . Essential hypertension 04/16/2014  . Hard of hearing    tinnitus both ears  . Hypertension   . Paroxysmal atrial fibrillation ([place]) 04/28/2014    Past Surgical History:  Procedure Laterality Date  . APPENDECTOMY  yrs ago  . HERNIA REPAIR  yrs ago  . HIP CLOSED REDUCTION  07/01/2011, left   Procedure: [place];  Surgeon: Magnus Sinning, MD;  Location: [facility];  Service: Orthopedics;  Laterality: Left;  . HIP CLOSED REDUCTION  06/11/2011   Procedure: CLOSED MANIPULATION HIP;  Surgeon: Magnus Sinning, MD;  Location: [facility];  Service: Orthopedics;  Laterality: Left;  . HIP CLOSED REDUCTION  07/01/2011   Procedure: CLOSED MANIPULATION HIP;  Surgeon: Magnus Sinning, MD;  Location: [facility];  Service: Orthopedics;  Laterality: Left;  . HIP CLOSED REDUCTION  12/08/2011   Procedure: CLOSED MANIPULATION HIP;  Surgeon: Sydnee Cabal, MD;  Location: [facility];   Service: Orthopedics;  Laterality: Left;  . JOINT REPLACEMENT  2007 right, left 17 yrs ago   Bilateral hip  . NM MYOVIEW LTD  2012  . TONSILLECTOMY  as child  . TOTAL HIP REVISION  10/02/2011   Procedure: TOTAL HIP REVISION;  Surgeon: Gearlean Alf, MD;  Location: [facility];  Service: Orthopedics;  Laterality: Right;  . TOTAL HIP REVISION  01/27/2012   Procedure: TOTAL HIP REVISION;  Surgeon: Gearlean Alf, MD;  Location: [facility];  Service: Orthopedics;  Laterality: Left;    Social history:  reports that he quit smoking about 23 years ago. His smoking use included Cigarettes. He started smoking about 43 years ago. He has a 10.00 pack-year smoking history. He quit smokeless tobacco use about 54 years ago. He reports that he does not drink alcohol or use drugs.  Ambulation: Patient ambulates without assistance at baseline.  Allergies  Allergen Reactions  . Quinolones Hives and Rash    83 yr old allergy to quinine    Family history: Hypertension in mother.  Prior to Admission medications   Medication Sig Start Date End Date Taking? Authorizing Provider  aspirin 325 MG tablet Take 325 mg by mouth every 6 (six) hours as needed for mild pain or moderate pain.   Yes Historical Provider, MD  dicyclomine (BENTYL) 10 MG capsule Take 10 mg by mouth daily as needed (for stomach upset).  04/30/16  Yes Historical Provider, MD  LORazepam (ATIVAN) 0.5 MG tablet Take 0.5 mg by mouth at bedtime as needed for sleep.  07/07/16  Yes Historical Provider, MD  metoprolol succinate (TOPROL-XL) 25 MG 24 hr tablet Take 1 tablet (25 mg total) by mouth daily. NEED OV. Patient taking differently: Take 25 mg by mouth 2 (two) times daily. NEED OV. 10/25/14  Yes Lelon Perla, MD  naproxen sodium (ANAPROX) 220 MG tablet Take 220 mg by mouth daily as needed (for pain).   Yes Historical Provider, MD    Physical Exam: Vitals:   07/11/16 1110 07/11/16 1120 07/11/16 1121 07/11/16 1346  BP:  (!) 159/87  (!) 171/82    Pulse:  67  67  Resp:  16  14  Temp:  97.7 F (36.5 C)    TempSrc:  Oral    SpO2: 95% 96%  98%  Weight:   72.6 kg (160 lb)   Height:   5' 10"  (1.778 m)     Constitutional: NAD, calm, comfortable Vitals:   07/11/16 1110 07/11/16 1120 07/11/16 1121 07/11/16 1346  BP:  (!) 159/87  (!) 171/82  Pulse:  67  67  Resp:  16  14  Temp:  97.7 F (36.5 C)    TempSrc:  Oral    SpO2: 95% 96%  98%  Weight:   72.6 kg (160 lb)   Height:   5' 10"  (1.778 m)    Eyes: PERRL, lids and conjunctivae normal ENMT: Mucous membranes are moist. Posterior pharynx clear of any exudate or lesions.Normal dentition.  Neck: normal, supple, no masses, no thyromegaly Respiratory: clear to auscultation bilaterally, no wheezing, no crackles. Normal respiratory effort. No accessory muscle use.  Cardiovascular: Regular rate and rhythm, Appreciate S1-S2 Abdomen: no tenderness, no masses palpated. No hepatosplenomegaly. Bowel sounds positive.  Musculoskeletal: Left leg pain,  unable to fully assess due to pain, no swelling in the legs Skin: no rashes, lesions, ulcers. No induration Neurologic: CN 2-12 grossly intact. Sensation intact, DTR normal. Strength 5/5 in all 4.  Psychiatric: Normal judgment and insight. Alert and oriented x 3. Normal mood.   Labs on Admission: I have personally reviewed following labs and imaging studies  CBC:  Recent Labs Lab 07/11/16 1206  WBC 7.4  HGB 12.2*  HCT 36.1*  MCV 97.0  PLT 967   Basic Metabolic Panel:  Recent Labs Lab 07/11/16 1206  NA 137  K 4.5  CL 104  CO2 27  GLUCOSE 113*  BUN 20  CREATININE 1.05  CALCIUM 8.6*   GFR: Estimated Creatinine Clearance: 51.9 mL/min (by C-G formula based on SCr of 1.05 mg/dL). Liver Function Tests: No results for input(s): AST, ALT, ALKPHOS, BILITOT, PROT, ALBUMIN in the last 168 hours. No results for input(s): LIPASE, AMYLASE in the last 168 hours. No results for input(s): AMMONIA in the last 168 hours. Coagulation  Profile:  Recent Labs Lab 07/11/16 1206  INR 1.20   Cardiac Enzymes: No results for input(s): CKTOTAL, CKMB, CKMBINDEX, TROPONINI in the last 168 hours. BNP (last 3 results) No results for input(s): PROBNP in the last 8760 hours. HbA1C: No results for input(s): HGBA1C in the last 72 hours. CBG: No results for input(s): GLUCAP in the last 168 hours. Lipid Profile: No results for input(s): CHOL, HDL, LDLCALC, TRIG, CHOLHDL, LDLDIRECT in the last 72 hours. Thyroid Function Tests: No results for input(s): TSH, T4TOTAL, FREET4, T3FREE, THYROIDAB in the last 72 hours. Anemia Panel: No results for input(s): VITAMINB12, FOLATE, FERRITIN, TIBC, IRON, RETICCTPCT in the last 72 hours. Urine analysis:    Component Value Date/Time   COLORURINE YELLOW 01/22/2012 1216   APPEARANCEUR CLEAR 01/22/2012 1216   LABSPEC 1.025 01/22/2012 1216   PHURINE 5.5 01/22/2012 1216   GLUCOSEU NEGATIVE 01/22/2012 1216   HGBUR NEGATIVE 01/22/2012 1216   BILIRUBINUR NEGATIVE 01/22/2012 1216   KETONESUR NEGATIVE 01/22/2012 1216   PROTEINUR NEGATIVE 01/22/2012 1216   UROBILINOGEN 0.2 01/22/2012 1216   NITRITE NEGATIVE 01/22/2012 1216   LEUKOCYTESUR NEGATIVE 01/22/2012 1216   Sepsis Labs: @LABRCNTIP (procalcitonin:4,lacticidven:4) )No results found for this or any previous visit (from the past 240 hour(s)).   Radiological Exams on Admission: Dg Chest 1 View Result Date: 07/11/2016 Slightly low lung volumes.  No acute chest findings.   Dg Hip Unilat With Pelvis 2-3 Views Left Result Date: 07/11/2016 Left total hip arthroplasty. Acute oblique fracture extending from the left greater trochanter, inferomedially into the subtrochanteric region. 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    EKG: pending   Assessment/Plan  Active Problems:   Hip fracture (HCC) - X ray showed acute oblique fracture extending from the left greater  trochanter, inferomedially into the subtrochanteric region - Appreciate ortho consult and recommendations - Keep NPO in case surgery is going to be done today    - Continue pain management efforts - PT/OT eval once patient able to participate    Essential hypertension - Resume metoprolol   DVT prophylaxis: Lovenox subcutaneous Code Status: Full code  Family Communication: Wife at the bedside Disposition Plan: Admission to medical floor Consults called: Orthopedic surgery  Admission status: Inpatient, patient presented with hip fracture and most likely will require surgery, orthopedic surgery consulted.   Leisa Lenz MD [facility] Pager [phone]  If 7PM-7AM, please contact night-coverage [email or web address] Password [email or web address]  07/11/2016, 1:49 PM

## 2016-07-11 NOTE — ED Notes (Signed)
Bed: WA04 Expected date:  Expected time:  Means of arrival:  Comments: 

## 2016-07-11 NOTE — ED Provider Notes (Signed)
[facility] DEPT Provider Note   CSN: [id number] Arrival date & time: 07/11/16  1052     History   Chief Complaint Chief Complaint  Patient presents with  . Fall    HPI KEVANTE LUNT is a 83 y.o. male.  Patient c/o fall at home. States initially mechanical fall 2 days ago, and again this AM fell out of bed. Pt unable to bear weight since. Denies loc with fall. Hip pain is constant, dull, mod-severe, worse w movement. Denies neck or back pain. Denies other pain or injury. Denies prior hip fx.  No faintness or dizziness prior to fall. States if needs orthopedist, has seen Dr Wynelle Link, and wants to use him or that group.    The history is provided by the patient.  Fall  Pertinent negatives include no chest pain, no abdominal pain, no headaches and no shortness of breath.    Past Medical History:  Diagnosis Date  . Anxiety   . Arthritis   . Essential hypertension 04/16/2014  . Hard of hearing    tinnitus both ears  . Hypertension   . Paroxysmal atrial fibrillation ([place]) 04/28/2014    Patient Active Problem List   Diagnosis Date Noted  . Paroxysmal atrial fibrillation ([place]) 04/28/2014  . Irregular heart rhythm 04/16/2014  . Nausea 04/16/2014  . Anxiety 04/16/2014  . Essential hypertension 04/16/2014  . Postop Acute blood loss anemia 01/29/2012  . Postop Hyponatremia 10/05/2011  . Failed total hip arthroplasty ([place]) 10/02/2011    Past Surgical History:  Procedure Laterality Date  . APPENDECTOMY  yrs ago  . HERNIA REPAIR  yrs ago  . HIP CLOSED REDUCTION  07/01/2011, left   Procedure: [place];  Surgeon: Magnus Sinning, MD;  Location: [facility];  Service: Orthopedics;  Laterality: Left;  . HIP CLOSED REDUCTION  06/11/2011   Procedure: CLOSED MANIPULATION HIP;  Surgeon: Magnus Sinning, MD;  Location: [facility];  Service: Orthopedics;  Laterality: Left;  . HIP CLOSED REDUCTION  07/01/2011   Procedure: CLOSED MANIPULATION HIP;  Surgeon: Magnus Sinning,  MD;  Location: [facility];  Service: Orthopedics;  Laterality: Left;  . HIP CLOSED REDUCTION  12/08/2011   Procedure: CLOSED MANIPULATION HIP;  Surgeon: Sydnee Cabal, MD;  Location: [facility];  Service: Orthopedics;  Laterality: Left;  . JOINT REPLACEMENT  2007 right, left 17 yrs ago   Bilateral hip  . NM MYOVIEW LTD  2012  . TONSILLECTOMY  as child  . TOTAL HIP REVISION  10/02/2011   Procedure: TOTAL HIP REVISION;  Surgeon: Gearlean Alf, MD;  Location: [facility];  Service: Orthopedics;  Laterality: Right;  . TOTAL HIP REVISION  01/27/2012   Procedure: TOTAL HIP REVISION;  Surgeon: Gearlean Alf, MD;  Location: [facility];  Service: Orthopedics;  Laterality: Left;       Home Medications    Prior to Admission medications   Medication Sig Start Date End Date Taking? Authorizing Provider  acetaminophen (TYLENOL) 325 MG tablet Take 650 mg by mouth as needed.    Historical Provider, MD  metoprolol succinate (TOPROL-XL) 25 MG 24 hr tablet Take 1 tablet (25 mg total) by mouth daily. NEED OV. 10/25/14   Lelon Perla, MD  oxyCODONE-acetaminophen (PERCOCET/ROXICET) 5-325 MG per tablet Take by mouth every 8 (eight) hours as needed for severe pain.    Historical Provider, MD    Family History History reviewed. No pertinent family history.  Social History Social History  Substance Use Topics  .  Smoking status: Former Smoker    Packs/day: 0.50    Years: 20.00    Types: Cigarettes    Start date: 07/12/1973    Quit date: 07/12/1993  . Smokeless tobacco: Former Systems developer    Quit date: 01/21/1962     Comment: QUIT 21 YEARS AGP  . Alcohol use No     Allergies   Quinolones   Review of Systems Review of Systems  Constitutional: Negative for fever.  HENT: Negative for sore throat.   Eyes: Negative for visual disturbance.  Respiratory: Negative for shortness of breath.   Cardiovascular: Negative for chest pain.  Gastrointestinal: Negative for abdominal pain.  Genitourinary: Negative for flank  pain.  Musculoskeletal: Negative for back pain and neck pain.  Skin: Negative for wound.  Neurological: Negative for weakness, numbness and headaches.  Hematological: Does not bruise/bleed easily.  Psychiatric/Behavioral: Negative for confusion.     Physical Exam Updated Vital Signs BP (!) 159/87 (BP Location: Left Arm)   Pulse 67   Temp 97.7 F (36.5 C) (Oral)   Resp 16   Ht 5\' 10"  (1.778 m)   Wt 72.6 kg   SpO2 96%   BMI 22.96 kg/m   Physical Exam  Constitutional: He appears well-developed and well-nourished. No distress.  HENT:  Head: Atraumatic.  Mouth/Throat: Oropharynx is clear and moist.  Eyes: Pupils are equal, round, and reactive to light.  Neck: Neck supple. No tracheal deviation present.  Cardiovascular: Normal rate, regular rhythm, normal heart sounds and intact distal pulses.   Pulmonary/Chest: Effort normal and breath sounds normal. No accessory muscle usage. No respiratory distress.  Abdominal: Soft. He exhibits no distension. There is no tenderness.  Musculoskeletal:  Left lower ext external rotated. Tenderness left hip. Distal pulses palp.  CTLS spine, non tender, aligned, no step off.   Neurological: He is alert.  Speech clear, motor intact bil, stre 5/5. sens grossly intact.   Skin: Skin is warm and dry. He is not diaphoretic.  Psychiatric: He has a normal mood and affect.  Nursing note and vitals reviewed.    ED Treatments / Results  Labs (all labs ordered are listed, but only abnormal results are displayed) Results for orders placed or performed during the hospital encounter of 73/22/02  Basic metabolic panel  Result Value Ref Range   Sodium 137 135 - 145 mmol/L   Potassium 4.5 3.5 - 5.1 mmol/L   Chloride 104 101 - 111 mmol/L   CO2 27 22 - 32 mmol/L   Glucose, Bld 113 (H) 65 - 99 mg/dL   BUN 20 6 - 20 mg/dL   Creatinine, Ser 1.05 0.61 - 1.24 mg/dL   Calcium 8.6 (L) 8.9 - 10.3 mg/dL   GFR calc non Af Amer >60 >60 mL/min   GFR calc Af Amer  >60 >60 mL/min   Anion gap 6 5 - 15  Protime-INR  Result Value Ref Range   Prothrombin Time 15.2 11.4 - 15.2 seconds   INR 1.20    Dg Chest 1 View  Result Date: 07/11/2016 CLINICAL DATA:  Recent falling.  Left hip pain after today's fall. EXAM: CHEST 1 VIEW COMPARISON:  12/08/2011 FINDINGS: Lungs are clear without airspace disease or pulmonary edema. Slightly low lung volumes with central vascular crowding. Atherosclerotic calcifications at the aortic arch. There are chronic round calcifications around the proximal left humerus. Severe joint space narrowing and degenerative changes at the left shoulder joint. Fullness in the right paratracheal region probably related to vascular crowding and slightly  low lung volumes. IMPRESSION: Slightly low lung volumes.  No acute chest findings. Electronically Signed   By: Markus Daft M.D.   On: 07/11/2016 12:15   Dg Hip Unilat With Pelvis 2-3 Views Left  Result Date: 07/11/2016 CLINICAL DATA:  Status post fall.  Left hip pain. EXAM: DG HIP (WITH OR WITHOUT PELVIS) 2-3V LEFT COMPARISON:  None. FINDINGS: Right total hip arthroplasty without failure complication. Left total hip arthroplasty. Acute oblique fracture extending from the left greater trochanter, inferomedially into the subtrochanteric region. 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 Generalized osteopenia. Mild osteoarthritis of bilateral sacroiliac joints. IMPRESSION: Left total hip arthroplasty. Acute oblique fracture extending from the left greater trochanter, inferomedially into the subtrochanteric region. 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 Electronically Signed   By: Kathreen Devoid   On: 07/11/2016 12:14     EKG  EKG Interpretation None       Radiology Dg Chest 1 View  Result Date: 07/11/2016 CLINICAL DATA:  Recent falling.  Left hip  pain after today's fall. EXAM: CHEST 1 VIEW COMPARISON:  12/08/2011 FINDINGS: Lungs are clear without airspace disease or pulmonary edema. Slightly low lung volumes with central vascular crowding. Atherosclerotic calcifications at the aortic arch. There are chronic round calcifications around the proximal left humerus. Severe joint space narrowing and degenerative changes at the left shoulder joint. Fullness in the right paratracheal region probably related to vascular crowding and slightly low lung volumes. IMPRESSION: Slightly low lung volumes.  No acute chest findings. Electronically Signed   By: Markus Daft M.D.   On: 07/11/2016 12:15   Dg Hip Unilat With Pelvis 2-3 Views Left  Result Date: 07/11/2016 CLINICAL DATA:  Status post fall.  Left hip pain. EXAM: DG HIP (WITH OR WITHOUT PELVIS) 2-3V LEFT COMPARISON:  None. FINDINGS: Right total hip arthroplasty without failure complication. Left total hip arthroplasty. Acute oblique fracture extending from the left greater trochanter, inferomedially into the subtrochanteric region. 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 Generalized osteopenia. Mild osteoarthritis of bilateral sacroiliac joints. IMPRESSION: Left total hip arthroplasty. Acute oblique fracture extending from the left greater trochanter, inferomedially into the subtrochanteric region. 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 Electronically Signed   By: Kathreen Devoid   On: 07/11/2016 12:14    Procedures Procedures (including critical care time)  Medications Ordered in ED Medications  0.9 %  sodium chloride infusion (not administered)     Initial Impression / Assessment and Plan / ED Course  I have reviewed the triage vital signs and the nursing notes.  Pertinent labs & imaging results that were available during my care of the patient  were reviewed by me and considered in my medical decision making (see chart for details).  Iv ns bolus. Dilaudid .5 mg iv.   xrays.  Labs.  Reviewed nursing notes and prior charts for additional history.   Discussed pt with orthopedics, Dr Alvan Dame - they will consult, request medical admission.  Pt will need pt consult, w weight bear restrictions, pain control.   Hospitalists consulted for hip fx admission.    Final Clinical Impressions(s) / ED Diagnoses   Final diagnoses:  None    New Prescriptions New Prescriptions   No medications on file     Lennette Bihari  Ashok Cordia, MD 07/11/16 1308

## 2016-07-11 NOTE — ED Triage Notes (Addendum)
Pt bib EMS after a falling out of bed from home.  On EMS arrival, EMS noted lateral rotation and deformity to the left hip.  Pt alert and oriented to everything except time. Fall was unwitnessed by family and EMS estimates that pt was left on the ground for about an hor.  No other injuries noted.  EMS placed pt on C-collar. Hx Dementia. EMS gave pt 100 mcg of fentanyl.

## 2016-07-11 NOTE — Progress Notes (Signed)
PT Cancellation Note  Patient Details Name: Tom Ortiz MRN: [id number] DOB: Aug 09, 1927   Cancelled Treatment:    Reason Eval/Treat Not Completed: Medical issues which prohibited therapy--Pt has acute fx. Will await ortho recommendations. )   Weston Anna, MPT Pager: [phone]

## 2016-07-12 DIAGNOSIS — S72142G Displaced intertrochanteric fracture of left femur, subsequent encounter for closed fracture with delayed healing: Secondary | ICD-10-CM

## 2016-07-12 DIAGNOSIS — R2681 Unsteadiness on feet: Secondary | ICD-10-CM

## 2016-07-12 MED ORDER — HYDROCODONE-ACETAMINOPHEN 5-325 MG PO TABS: 1.0000 | ORAL_TABLET | Freq: Four times a day (QID) | ORAL | 0 refills | Status: DC | PRN

## 2016-07-12 MED ORDER — METHOCARBAMOL 1000 MG/10ML IJ SOLN
500.0000 mg | Freq: Four times a day (QID) | INTRAVENOUS | Status: DC | PRN
  Administered 2016-07-12 – 2016-07-14 (×2): 500 mg via INTRAVENOUS
  Filled 2016-07-12: qty 5
  Filled 2016-07-12 (×2): qty 550

## 2016-07-12 MED ORDER — HYDRALAZINE HCL 20 MG/ML IJ SOLN
5.0000 mg | Freq: Four times a day (QID) | INTRAMUSCULAR | Status: DC | PRN
  Administered 2016-07-14: 17:00:00 5 mg via INTRAVENOUS
  Filled 2016-07-12: qty 1

## 2016-07-12 MED ORDER — DOCUSATE SODIUM 100 MG PO CAPS
100.0000 mg | ORAL_CAPSULE | Freq: Two times a day (BID) | ORAL | Status: DC
  Administered 2016-07-12 – 2016-07-16 (×8): 100 mg via ORAL
  Filled 2016-07-12 (×8): qty 1

## 2016-07-12 MED ORDER — SENNA 8.6 MG PO TABS: 1.0000 | ORAL_TABLET | Freq: Every day | ORAL | 0 refills | Status: DC

## 2016-07-12 MED ORDER — MORPHINE SULFATE (PF) 4 MG/ML IV SOLN
1.0000 mg | INTRAVENOUS | Status: DC | PRN
  Administered 2016-07-12 – 2016-07-14 (×2): 1 mg via INTRAVENOUS
  Filled 2016-07-12 (×2): qty 1

## 2016-07-12 MED ORDER — ASPIRIN 81 MG PO CHEW
81.0000 mg | CHEWABLE_TABLET | Freq: Every day | ORAL | Status: DC
  Administered 2016-07-12 – 2016-07-13 (×2): 81 mg via ORAL
  Filled 2016-07-12 (×2): qty 1

## 2016-07-12 MED ORDER — DOCUSATE SODIUM 100 MG PO CAPS: 100.0000 mg | ORAL_CAPSULE | Freq: Two times a day (BID) | ORAL | 0 refills | Status: DC

## 2016-07-12 MED ORDER — METHOCARBAMOL 500 MG PO TABS: 500.0000 mg | ORAL_TABLET | Freq: Four times a day (QID) | ORAL | 0 refills | Status: DC

## 2016-07-12 MED ORDER — ENOXAPARIN SODIUM 30 MG/0.3ML ~~LOC~~ SOLN: 30.0000 mg | SUBCUTANEOUS | 0 refills | Status: DC

## 2016-07-12 MED ORDER — SENNA 8.6 MG PO TABS
1.0000 | ORAL_TABLET | Freq: Every day | ORAL | Status: DC
  Administered 2016-07-12 – 2016-07-16 (×4): 8.6 mg via ORAL
  Filled 2016-07-12 (×4): qty 1

## 2016-07-12 NOTE — Evaluation (Signed)
Physical Therapy Evaluation Patient Details Name: Tom Ortiz MRN: [id number] DOB: May 26, 1927 Today's Date: 07/12/2016   History of Present Illness  83 y.o. male admitted following a mechanical fall with resulting Lt hip pain. Fracture found at left greater trochanter to the subtrochanteric region. Atempting conservative management at this time. PMH: atrial fib, HTN, anxiety, bilateral THA.   Clinical Impression  Pt is presenting with significant difficulty with mobility due to Lt hip pain. At this time the pt is requiring +2 assistance to sit EOB and for standing with rw. Pt was unable to take any steps for ambulation at this time. Based upon the patient's current mobility, recommending SNF for further care and rehabilitation following his acute stay. PT to continue to follow.     Follow Up Recommendations SNF;Supervision/Assistance - 24 hour    Equipment Recommendations   (TBD)    Recommendations for Other Services       Precautions / Restrictions Precautions Precautions: Fall Precaution Comments: Lt hip fx Restrictions Weight Bearing Restrictions: Yes LLE Weight Bearing: Touchdown weight bearing      Mobility  Bed Mobility Overal bed mobility: Needs Assistance Bed Mobility: Supine to Sit;Sit to Supine     Supine to sit: +2 for physical assistance;Max assist Sit to supine: +2 for physical assistance;Max assist   General bed mobility comments: all mobility is slow and guarded, pain reported with movement  Transfers Overall transfer level: Needs assistance Equipment used: Rolling walker (2 wheeled) Transfers: Sit to/from Stand Sit to Stand: +2 physical assistance;Mod assist            Ambulation/Gait             General Gait Details: unable to attempt due to pain  Stairs            Wheelchair Mobility    Modified Rankin (Stroke Patients Only)       Balance Overall balance assessment: Needs assistance Sitting-balance support: No upper  extremity supported Sitting balance-Leahy Scale: Fair     Standing balance support: Bilateral upper extremity supported Standing balance-Leahy Scale: Poor Standing balance comment: requiring use of UEs and rw                             Pertinent Vitals/Pain Pain Assessment: Faces Faces Pain Scale: Hurts whole lot (with activity) Pain Location: Lt hip Pain Descriptors / Indicators: Grimacing;Guarding;Moaning Pain Intervention(s): Limited activity within patient's tolerance;Monitored during session;Ice applied;Repositioned    Home Living Family/patient expects to be discharged to:: Unsure Living Arrangements: Spouse/significant other Available Help at Discharge: Family;Available 24 hours/day (spouse) Type of Home: House Home Access: Stairs to enter Entrance Stairs-Rails: Right Entrance Stairs-Number of Steps: 3 Home Layout: One level Home Equipment: Walker - 2 wheels;Cane - single point      Prior Function Level of Independence: Independent with assistive device(s)         Comments: reports using cane for ambulation     Hand Dominance        Extremity/Trunk Assessment   Upper Extremity Assessment Upper Extremity Assessment: Generalized weakness    Lower Extremity Assessment Lower Extremity Assessment: Generalized weakness;LLE deficits/detail LLE Deficits / Details: difficult to assess due to pain        Communication   Communication: HOH  Cognition Arousal/Alertness: Awake/alert Behavior During Therapy: WFL for tasks assessed/performed Overall Cognitive Status: Within Functional Limits for tasks assessed  General Comments      Exercises     Assessment/Plan    PT Assessment Patient needs continued PT services  PT Problem List Decreased strength;Decreased range of motion;Decreased activity tolerance;Decreased balance;Decreased mobility       PT Treatment Interventions DME  instruction;Gait training;Stair training;Functional mobility training;Therapeutic activities;Balance training;Therapeutic exercise;Patient/family education    PT Goals (Current goals can be found in the Care Plan section)  Acute Rehab PT Goals Patient Stated Goal: have less pain PT Goal Formulation: With patient Time For Goal Achievement: 07/26/16 Potential to Achieve Goals: Fair    Frequency Min 2X/week   Barriers to discharge        Co-evaluation               End of Session Equipment Utilized During Treatment: Gait belt Activity Tolerance: Patient limited by pain Patient left: in bed;with call bell/phone within reach;with bed alarm set Nurse Communication: Mobility status PT Visit Diagnosis: Unsteadiness on feet (R26.81);Muscle weakness (generalized) (M62.81);History of falling (Z91.81);Difficulty in walking, not elsewhere classified (R26.2);Pain Pain - Right/Left: Left Pain - part of body: Hip    Time: 9611-6435 PT Time Calculation (min) (ACUTE ONLY): 24 min   Charges:   PT Evaluation $PT Eval Moderate Complexity: 1 Procedure PT Treatments $Therapeutic Activity: 8-22 mins   PT G Codes:        Cassell Clement, PT, CSCS Pager [phone] Office [place] 07/12/2016, 12:32 PM

## 2016-07-12 NOTE — Clinical Social Work Note (Signed)
Clinical Social Work Assessment  Patient Details  Name: Tom Ortiz MRN: [id number] Date of Birth: 1927-10-27  Date of referral:  07/12/16               Reason for consult:  Facility Placement                Permission sought to share information with:  Chartered certified accountant granted to share information::  Yes, Verbal Permission Granted  Name::        Agency::     Relationship::     Contact Information:     Housing/Transportation Living arrangements for the past 2 months:  Single Family Home Source of Information:  Patient, Adult Children Patient Interpreter Needed:  None Criminal Activity/Legal Involvement Pertinent to Current Situation/Hospitalization:  No - Comment as needed Significant Relationships:  Adult Children, Spouse Lives with:  Spouse Do you feel safe going back to the place where you live?  No Need for family participation in patient care:  Yes (Comment)  Care giving concerns:  CSW received consult for SNF placement, reviewed PT evaluation recommending SNF at discharge.    Social Worker assessment / plan:  CSW spoke with patient, son - Shaunte & daughter, Magdalen Spatz at bedside re: discharge planning. Patient states that he's been to [facility] in the past, but does not remember much from either stay. Patient is open to bed offers within [facility]. CSW completed FL2 and sent information out to [place] - weekday CSW to follow-up with bed offers when available.   Employment status:  Retired Forensic scientist:  [employer] PT Recommendations:  [place] / Referral to community resources:  [place]  Patient/Family's Response to care:    Patient/Family's Understanding of and Emotional Response to Diagnosis, Current Treatment, and Prognosis:    Emotional Assessment Appearance:  Appears stated age Attitude/Demeanor/Rapport:    Affect (typically observed):  Pleasant,  Accepting Orientation:  Oriented to Self, Oriented to Place, Oriented to  Time, Oriented to Situation Alcohol / Substance use:    Psych involvement (Current and /or in the community):     Discharge Needs  Concerns to be addressed:    Readmission within the last 30 days:    Current discharge risk:    Barriers to Discharge:      Standley Brooking, LCSW 07/12/2016, 2:13 PM

## 2016-07-12 NOTE — Progress Notes (Signed)
OT Cancellation Note  Patient Details Name: Tom Ortiz MRN: [id number] DOB: Apr 21, 1927   Cancelled Treatment:    Reason Eval/Treat Not Completed: Other (comment).  Pt awaiting sx.  Please reorder after this. Thank you.  SPENCER,MARYELLEN 07/12/2016, 7:16 AM  Lesle Chris, OTR/L [phone] 07/12/2016

## 2016-07-12 NOTE — Consult Note (Signed)
Reason for Consult: Fall with left hip pain Referring Physician: Emergency Room MD, Whitten Andreoni is an 83 y.o. male.  HPI: Tom Ortiz is a 83 y.o. male with medical history significant for left hip instability and history of left total hip arthroplasty back in 2013, history of atrial fibrillation by family at the bedside was not sure whether patient was on a blood thinner or not. Based on Epic review looks like he used to be on xarelto but as mentioned family at the bedside does not remember that. I also asked about history of multiple myeloma and he has seen Dr. Marin Olp back in 2016, the issue at that time was that he has been having lot of hip problems and multiple hip surgeries. At that time the plan was for bone scan, vitamin D supplementation and bisphosphonates. I don't see any follow-up visits after that time and family cannot recall if they ever followed up for multiple myeloma. Patient presented to [facility] because he fell earlier today prior to the admission. He he reports that as he was walking the left leg just gave out on him and he fell pretty hard onto the floor on the left side. He is pain is 10 out of 10 in intensity from the left side of the hip radiating down to the leg. Pain is better with analgesia given in ED. He still has pain even at rest. No loss of consciousness at the time of the fall. No other complaints such as chest pain, palpitations or shortness of breath. No fevers or chills. No abdominal pain, nausea or vomiting.  Orthopaedics consulted for management/recommendations re left hip fracture  Past Medical History:  Diagnosis Date  . Anxiety   . Arthritis   . Essential hypertension 04/16/2014  . Hard of hearing    tinnitus both ears  . Hypertension   . Paroxysmal atrial fibrillation ([place]) 04/28/2014    Past Surgical History:  Procedure Laterality Date  . APPENDECTOMY  yrs ago  . HERNIA REPAIR  yrs ago  . HIP CLOSED REDUCTION  07/01/2011, left   Procedure: [place];  Surgeon: Magnus Sinning, MD;  Location: [facility];  Service: Orthopedics;  Laterality: Left;  . HIP CLOSED REDUCTION  06/11/2011   Procedure: CLOSED MANIPULATION HIP;  Surgeon: Magnus Sinning, MD;  Location: [facility];  Service: Orthopedics;  Laterality: Left;  . HIP CLOSED REDUCTION  07/01/2011   Procedure: CLOSED MANIPULATION HIP;  Surgeon: Magnus Sinning, MD;  Location: [facility];  Service: Orthopedics;  Laterality: Left;  . HIP CLOSED REDUCTION  12/08/2011   Procedure: CLOSED MANIPULATION HIP;  Surgeon: Sydnee Cabal, MD;  Location: [facility];  Service: Orthopedics;  Laterality: Left;  . JOINT REPLACEMENT  2007 right, left 17 yrs ago   Bilateral hip  . NM MYOVIEW LTD  2012  . TONSILLECTOMY  as child  . TOTAL HIP REVISION  10/02/2011   Procedure: TOTAL HIP REVISION;  Surgeon: Gearlean Alf, MD;  Location: [facility];  Service: Orthopedics;  Laterality: Right;  . TOTAL HIP REVISION  01/27/2012   Procedure: TOTAL HIP REVISION;  Surgeon: Gearlean Alf, MD;  Location: [facility];  Service: Orthopedics;  Laterality: Left;    History reviewed. No pertinent family history.  Social History:  reports that he quit smoking about 23 years ago. His smoking use included Cigarettes. He started smoking about 43 years ago. He has a 10.00 pack-year smoking history. He quit smokeless tobacco use about  54 years ago. He reports that he does not drink alcohol or use drugs.  Allergies:  Allergies  Allergen Reactions  . Quinolones Hives and Rash    83 yr old allergy to quinine    Medications: I have reviewed the patient's current medications.  Results for orders placed or performed during the hospital encounter of 07/11/16 (from the past 24 hour(s))  Basic metabolic panel     Status: Abnormal   Collection Time: 07/11/16 12:06 PM  Result Value Ref Range   Sodium 137 135 - 145 mmol/L   Potassium 4.5 3.5 - 5.1 mmol/L   Chloride 104 101 - 111 mmol/L   CO2 27 22 - 32 mmol/L    Glucose, Bld 113 (H) 65 - 99 mg/dL   BUN 20 6 - 20 mg/dL   Creatinine, Ser 1.05 0.61 - 1.24 mg/dL   Calcium 8.6 (L) 8.9 - 10.3 mg/dL   GFR calc non Af Amer >60 >60 mL/min   GFR calc Af Amer >60 >60 mL/min   Anion gap 6 5 - 15  CBC     Status: Abnormal   Collection Time: 07/11/16 12:06 PM  Result Value Ref Range   WBC 7.4 4.0 - 10.5 K/uL   RBC 3.72 (L) 4.22 - 5.81 MIL/uL   Hemoglobin 12.2 (L) 13.0 - 17.0 g/dL   HCT 36.1 (L) 39.0 - 52.0 %   MCV 97.0 78.0 - 100.0 fL   MCH 32.8 26.0 - 34.0 pg   MCHC 33.8 30.0 - 36.0 g/dL   RDW 13.8 11.5 - 15.5 %   Platelets 185 150 - 400 K/uL  Protime-INR     Status: None   Collection Time: 07/11/16 12:06 PM  Result Value Ref Range   Prothrombin Time 15.2 11.4 - 15.2 seconds   INR 1.20   Surgical pcr screen     Status: None   Collection Time: 07/11/16  3:39 PM  Result Value Ref Range   MRSA, PCR NEGATIVE NEGATIVE   Staphylococcus aureus NEGATIVE NEGATIVE    X-ray: CLINICAL DATA:  Status post fall.  Left hip pain.  EXAM: DG HIP (WITH OR WITHOUT PELVIS) 2-3V LEFT  COMPARISON:  None.  FINDINGS: Right total hip arthroplasty without failure complication.  Left total hip arthroplasty. Acute oblique fracture extending from the left greater trochanter, inferomedially into the subtrochanteric region. 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  Generalized osteopenia. Mild osteoarthritis of bilateral sacroiliac joints.  IMPRESSION: Left total hip arthroplasty. Acute oblique fracture extending from the left greater trochanter, inferomedially into the subtrochanteric region.  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   Electronically Signed   By: Kathreen Devoid   On: 07/11/2016 12:14  ROS  Constitutional: Negative for fever, chills, diaphoresis, activity change, appetite change  and fatigue.  HENT: Negative for ear pain, nosebleeds, congestion, facial swelling, rhinorrhea, neck pain, neck stiffness and ear discharge.   Eyes: Negative for pain, discharge, redness, itching and visual disturbance.  Respiratory: Negative for cough, choking, chest tightness, shortness of breath, wheezing and stridor.   Cardiovascular: Negative for chest pain, palpitations and leg swelling.  Gastrointestinal: Negative for abdominal distention.  Genitourinary: Negative for dysuria, urgency, frequency, hematuria, flank pain, decreased urine volume, difficulty urinating and dyspareunia.  Musculoskeletal: Per history of present illness, ACUTE LEFT HIP PAIN Neurological: Negative for dizziness, tremors, seizures, syncope, facial asymmetry, speech difficulty, weakness, light-headedness,  numbness and headaches.  Hematological: Negative for adenopathy. Does not bruise/bleed easily.  Psychiatric/Behavioral: Negative for hallucinations, behavioral problems, confusion, dysphoric mood, decreased concentration and agitation.   Blood pressure 140/85, pulse 70, temperature 98 F (36.7 C), temperature source Oral, resp. rate 16, height 5' 10"  (1.778 m), weight 74.8 kg (165 lb), SpO2 98 %.  Physical Exam  Awake alert Pain with any movement of left hip, tender to palpation around hip NVI Right hip exam wnl after THR No upper UE injuries noted  General medical exam reviewed for pertinent findings, stable  Assessment/Plan: Left hip stable [place] B peri-prosthetic proximal femur fracture Fracture line through proximal body of prosthesis without evidence of prosthetic component subsidence or displacement  Given this findings surgery not necessary unless pain prohibitive Would recommend up with therapy TD to PWB LLE with walker Follow up clinically until fracture heals Pain control Disposition based on needs assessment with PT Appropriate DVT prophylaxis, SCDs while in bed, chemoprophylaxis per  medicine but antiplatelet agent ok at discharge for 4 weeks  OLIN,MATTHEW D 07/12/2016, 7:16 AM

## 2016-07-12 NOTE — Progress Notes (Signed)
   Subjective:  Patient reports pain as moderate.  Increased pain with movement of the left leg.  He is happy to not need surgery.  Objective:   VITALS:   Vitals:   07/11/16 1753 07/11/16 2121 07/12/16 0130 07/12/16 0413  BP: (!) 169/68 (!) 154/87 (!) 138/91 140/85  Pulse: 64 70 73 70  Resp: 15 16 15 16   Temp: 97.6 F (36.4 C) 98 F (36.7 C) 98.3 F (36.8 C) 98 F (36.7 C)  TempSrc: Oral Oral Oral Oral  SpO2: 100% 100% 97% 98%  Weight:      Height:        Neurovascular intact Sensation intact distally Intact pulses distally TTP overlying proximal thigh   Lab Results  Component Value Date   WBC 7.4 07/11/2016   HGB 12.2 (L) 07/11/2016   HCT 36.1 (L) 07/11/2016   MCV 97.0 07/11/2016   PLT 185 07/11/2016   BMET    Component Value Date/Time   NA 137 07/11/2016 1206   K 4.5 07/11/2016 1206   CL 104 07/11/2016 1206   CO2 27 07/11/2016 1206   GLUCOSE 113 (H) 07/11/2016 1206   BUN 20 07/11/2016 1206   CREATININE 1.05 07/11/2016 1206   CALCIUM 8.6 (L) 07/11/2016 1206   GFRNONAA >60 07/11/2016 1206   GFRAA >60 07/11/2016 1206     Assessment/Plan:     Active Problems:   Hip fracture (HCC)   Advance diet Up with therapy Pain control DVT ppx with lovenox, SCD  Added muscle relaxer to help with pain control Will plan to follow pt until fracture heals with hope of non operative management   Nicholes Stairs 07/12/2016, 8:35 AM   Geralynn Rile, MD [phone]

## 2016-07-12 NOTE — Clinical Social Work Placement (Signed)
  CLINICAL SOCIAL WORK PLACEMENT  NOTE  Date:  07/12/2016  Patient Details  Name: Tom Ortiz MRN: [id number] Date of Birth: 10/29/1927  Clinical Social Work is seeking post-discharge placement for this patient at the [place] level of care (*CSW will initial, date and re-position this form in  chart as items are completed):  Yes   Patient/family provided with [facility] Work Department's list of facilities offering this level of care within the geographic area requested by the patient (or if unable, by the patient's family).  Yes   Patient/family informed of their freedom to choose among providers that offer the needed level of care, that participate in Medicare, Medicaid or managed care program needed by the patient, have an available bed and are willing to accept the patient.  Yes   Patient/family informed of [facility]'s ownership interest in [facility] and [facility], as well as of the fact that they are under no obligation to receive care at these facilities.  PASRR submitted to EDS on       PASRR number received on       Existing PASRR number confirmed on 07/12/16     FL2 transmitted to all facilities in geographic area requested by pt/family on 07/12/16     FL2 transmitted to all facilities within larger geographic area on       Patient informed that his/her managed care company has contracts with or will negotiate with certain facilities, including the following:            Patient/family informed of bed offers received.  Patient chooses bed at       Physician recommends and patient chooses bed at      Patient to be transferred to   on  .  Patient to be transferred to facility by       Patient family notified on   of transfer.  Name of family member notified:        PHYSICIAN       Additional Comment:    _______________________________________________ Standley Brooking, LCSW 07/12/2016, 2:16 PM

## 2016-07-12 NOTE — NC FL2 (Signed)
[place] LEVEL OF CARE SCREENING TOOL     IDENTIFICATION  Patient Name: Tom Ortiz Birthdate: 1928-03-14 Sex: male Admission Date (Current Location): 07/11/2016  [facility] and [place] Number:  Herbalist and Address:  [facility],  [place] [address], [place]      Provider Number: [id number]  Attending Physician Name and Address:  Orson Eva, MD  Relative Name and Phone Number:       Current Level of Care: Hospital Recommended Level of Care: [place] Prior Approval Number:    Date Approved/Denied:   PASRR Number: [id number]  Discharge Plan: SNF    Current Diagnoses: Patient Active Problem List   Diagnosis Date Noted  . Closed intertrochanteric fracture of left hip, with delayed healing, subsequent encounter 07/12/2016  . Gait instability 07/12/2016  . Hip fracture ([place]) 07/11/2016  . Paroxysmal atrial fibrillation ([place]) 04/28/2014  . Irregular heart rhythm 04/16/2014  . Nausea 04/16/2014  . Anxiety 04/16/2014  . Essential hypertension 04/16/2014  . Postop Acute blood loss anemia 01/29/2012  . Postop Hyponatremia 10/05/2011  . Failed total hip arthroplasty ([place]) 10/02/2011    Orientation RESPIRATION BLADDER Height & Weight     Self, Time, Situation, Place  Normal Continent Weight: 165 lb (74.8 kg) Height:  5\' 10"  (177.8 cm)  BEHAVIORAL SYMPTOMS/MOOD NEUROLOGICAL BOWEL NUTRITION STATUS      Continent Diet (Regular)  AMBULATORY STATUS COMMUNICATION OF NEEDS Skin   Extensive Assist Verbally Normal                       Personal Care Assistance Level of Assistance  Bathing, Dressing Bathing Assistance: Limited assistance   Dressing Assistance: Limited assistance     Functional Limitations Info             SPECIAL CARE FACTORS FREQUENCY  PT (By licensed PT), OT (By licensed OT)     PT Frequency: 5 OT Frequency: 5            Contractures      Additional Factors Info  Code  Status, Allergies Code Status Info: Fullcode Allergies Info: Quinolones           Current Medications (07/12/2016):  This is the current hospital active medication list Current Facility-Administered Medications  Medication Dose Route Frequency Provider Last Rate Last Dose  . 0.9 %  sodium chloride infusion   Intravenous Continuous Lajean Saver, MD 20 mL/hr at 07/11/16 1213    . aspirin chewable tablet 81 mg  81 mg Oral Daily Orson Eva, MD   81 mg at 07/12/16 0917  . docusate sodium (COLACE) capsule 100 mg  100 mg Oral BID Orson Eva, MD   100 mg at 07/12/16 0917  . enoxaparin (LOVENOX) injection 30 mg  30 mg Subcutaneous Q24H Robbie Lis, MD   30 mg at 07/11/16 1748  . hydrALAZINE (APRESOLINE) injection 5 mg  5 mg Intravenous Q6H PRN Orson Eva, MD      . HYDROcodone-acetaminophen (NORCO/VICODIN) 5-325 MG per tablet 1-2 tablet  1-2 tablet Oral Q6H PRN Robbie Lis, MD   2 tablet at 07/12/16 1354  . methocarbamol (ROBAXIN) 500 mg in dextrose 5 % 50 mL IVPB  500 mg Intravenous Q6H PRN Nicholes Stairs, MD 110 mL/hr at 07/12/16 0953 500 mg at 07/12/16 0953  . metoprolol succinate (TOPROL-XL) 24 hr tablet 25 mg  25 mg Oral BID Robbie Lis, MD   25 mg at  07/12/16 0917  . morphine 4 MG/ML injection 1 mg  1 mg Intravenous Q2H PRN Nicholes Stairs, MD   1 mg at 07/12/16 0917  . senna (SENOKOT) tablet 8.6 mg  1 tablet Oral Daily Orson Eva, MD   8.6 mg at 07/12/16 8563     Discharge Medications: Please see discharge summary for a list of discharge medications.  Relevant Imaging Results:  Relevant Lab Results:   Additional Information SSN: [id number]  Standley Brooking, LCSW

## 2016-07-12 NOTE — Progress Notes (Signed)
PROGRESS NOTE  Tom Ortiz QBH:[id number] DOB: 08/13/1927 DOA: 07/11/2016 PCP: Thressa Sheller, MD  Brief History:  83 year old male with a history of osteoporosis, paroxysmal atrial fibrillation, hypertension, MGUS present Dr. Randel Books fall on the morning of 07/11/2016. The patient states that his left leg "just gave out"as he was pivoting to move. He denies loss of consciousness. The patient also states that he has had 2 other falls this past month which she relates to gait instability and weakness of his left leg. He has no problems with his left leg for at least 1 year.  Patient denies fevers, chills, headache, chest pain, dyspnea, nausea, vomiting, diarrhea, abdominal pain, dysuria, hematuria, hematochezia, and melena. X-rays of the left hip revealed an oblique fracture of the left femur from the left greater trochanter to the subtrochanteric region. Orthopedics was consulted to assist with management.   Assessment/Plan: Left trochanteric fracture -Appreciate orthopedic consult--> nonsurgical management -Weightbearing status per orthopedics -Pain control -PT evaluation -Anticipate pt needs SNF -stool softener -DVT prophylaxis per ortho  Essential Hypertension -continue metoprolol succinate -BP elevation in part due to pain -BP acceptable  Paroxysmal Atrial Fibrillation -CHADS-VASc = 3 -pt last saw Dr. Sallyanne Kuster 04/28/14-->started on rivaroxaban -pt then lost to follow up and does not recall any details regarding anticoagulation -presently in sinus rhythm -will need out patient cardiology follow up -continue ASA  MGUS -previously saw Dr. Marin Olp 07/13/14 -CBC stable   Disposition Plan:   SNF 07/13/16 if stable Family Communication:  No Family at bedside  Consultants: Ortho   Code Status:  FULL  DVT Prophylaxis:  [place] Lovenox  Previous notes and labs reviewed and summarized.  Total time spent 35 minutes.  Greater than 50% spent face to face counseling and  coordinating care.    Procedures: As Listed in Progress Note Above  Antibiotics: None    Subjective: Patient with a sharp intermittent pain in his left hip with minimal movement. He describes his pain as moderate worsening with movement. Denies any fevers, chills, chest pain, shortness breath, palpitations, dizziness, nausea, vomiting, diarrhea, abdominal pain. No dysuria or hematuria.  Objective: Vitals:   07/11/16 1753 07/11/16 2121 07/12/16 0130 07/12/16 0413  BP: (!) 169/68 (!) 154/87 (!) 138/91 140/85  Pulse: 64 70 73 70  Resp: 15 16 15 16   Temp: 97.6 F (36.4 C) 98 F (36.7 C) 98.3 F (36.8 C) 98 F (36.7 C)  TempSrc: Oral Oral Oral Oral  SpO2: 100% 100% 97% 98%  Weight:      Height:        Intake/Output Summary (Last 24 hours) at 07/12/16 0854 Last data filed at 07/12/16 0700  Gross per 24 hour  Intake           115.67 ml  Output              675 ml  Net          -559.33 ml   Weight change:  Exam:   General:  Pt is alert, follows commands appropriately, not in acute distress  HEENT: No icterus, No thrush, No neck mass, [place]/AT  Cardiovascular: RRR, S1/S2, no rubs, no gallops  Respiratory: CTA bilaterally, no wheezing, no crackles, no rhonchi  Abdomen: Soft/+BS, non tender, non distended, no guarding  Extremities: No edema, No lymphangitis, No petechiae, No rashes, no synovitis   Data Reviewed: I have personally reviewed following labs and imaging studies Basic Metabolic Panel:  Recent Labs Lab 07/11/16 1206  NA 137  K 4.5  CL 104  CO2 27  GLUCOSE 113*  BUN 20  CREATININE 1.05  CALCIUM 8.6*   Liver Function Tests: No results for input(s): AST, ALT, ALKPHOS, BILITOT, PROT, ALBUMIN in the last 168 hours. No results for input(s): LIPASE, AMYLASE in the last 168 hours. No results for input(s): AMMONIA in the last 168 hours. Coagulation Profile:  Recent Labs Lab 07/11/16 1206  INR 1.20   CBC:  Recent Labs Lab 07/11/16 1206  WBC  7.4  HGB 12.2*  HCT 36.1*  MCV 97.0  PLT 185   Cardiac Enzymes: No results for input(s): CKTOTAL, CKMB, CKMBINDEX, TROPONINI in the last 168 hours. BNP: Invalid input(s): POCBNP CBG: No results for input(s): GLUCAP in the last 168 hours. HbA1C: No results for input(s): HGBA1C in the last 72 hours. Urine analysis:    Component Value Date/Time   COLORURINE YELLOW 01/22/2012 [place] 01/22/2012 1216   LABSPEC 1.025 01/22/2012 1216   PHURINE 5.5 01/22/2012 1216   GLUCOSEU NEGATIVE 01/22/2012 1216   HGBUR NEGATIVE 01/22/2012 1216   BILIRUBINUR NEGATIVE 01/22/2012 1216   KETONESUR NEGATIVE 01/22/2012 1216   PROTEINUR NEGATIVE 01/22/2012 1216   UROBILINOGEN 0.2 01/22/2012 1216   NITRITE NEGATIVE 01/22/2012 1216   LEUKOCYTESUR NEGATIVE 01/22/2012 1216   Sepsis Labs: @LABRCNTIP (procalcitonin:4,lacticidven:4) ) Recent Results (from the past 240 hour(s))  Surgical pcr screen     Status: None   Collection Time: 07/11/16  3:39 PM  Result Value Ref Range Status   MRSA, PCR NEGATIVE NEGATIVE Final   Staphylococcus aureus NEGATIVE NEGATIVE Final    Comment:        The Xpert SA Assay (FDA approved for NASAL specimens in patients over 54 years of age), is one component of a comprehensive surveillance program.  Test performance has been validated by [facility] for patients greater than or equal to 22 year old. It is not intended to diagnose infection nor to guide or monitor treatment.      Scheduled Meds: . docusate sodium  100 mg Oral BID  . enoxaparin (LOVENOX) injection  30 mg Subcutaneous Q24H  . metoprolol succinate  25 mg Oral BID  . senna  1 tablet Oral Daily   Continuous Infusions: . sodium chloride 20 mL/hr at 07/11/16 1213  . methocarbamol (ROBAXIN)  IV      Procedures/Studies: Dg Chest 1 View  Result Date: 07/11/2016 CLINICAL DATA:  Recent falling.  Left hip pain after today's fall. EXAM: CHEST 1 VIEW COMPARISON:  12/08/2011 FINDINGS:  Lungs are clear without airspace disease or pulmonary edema. Slightly low lung volumes with central vascular crowding. Atherosclerotic calcifications at the aortic arch. There are chronic round calcifications around the proximal left humerus. Severe joint space narrowing and degenerative changes at the left shoulder joint. Fullness in the right paratracheal region probably related to vascular crowding and slightly low lung volumes. IMPRESSION: Slightly low lung volumes.  No acute chest findings. Electronically Signed   By: Markus Daft M.D.   On: 07/11/2016 12:15   Dg Hip Unilat With Pelvis 2-3 Views Left  Result Date: 07/11/2016 CLINICAL DATA:  Status post fall.  Left hip pain. EXAM: DG HIP (WITH OR WITHOUT PELVIS) 2-3V LEFT COMPARISON:  None. FINDINGS: Right total hip arthroplasty without failure complication. Left total hip arthroplasty. Acute oblique fracture extending from the left greater trochanter, inferomedially into the subtrochanteric region. 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  Generalized osteopenia. Mild osteoarthritis of bilateral sacroiliac joints. IMPRESSION: Left total hip arthroplasty. Acute oblique fracture extending from the left greater trochanter, inferomedially into the subtrochanteric region. 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 Electronically Signed   By: Kathreen Devoid   On: 07/11/2016 12:14    TAT, DAVID, DO  [facility] Pager [phone]  If 7PM-7AM, please contact night-coverage [email or web address] Password [email or web address] 07/12/2016, 8:54 AM   LOS: 1 day

## 2016-07-13 LAB — BASIC METABOLIC PANEL
Anion gap: 5 (ref 5–15)
BUN: 32 mg/dL — ABNORMAL HIGH (ref 6–20)
CO2: 29 mmol/L (ref 22–32)
Calcium: 8.4 mg/dL — ABNORMAL LOW (ref 8.9–10.3)
Chloride: 104 mmol/L (ref 101–111)
Creatinine, Ser: 1.23 mg/dL (ref 0.61–1.24)
GFR calc Af Amer: 59 mL/min — ABNORMAL LOW (ref >60)
GFR calc non Af Amer: 51 mL/min — ABNORMAL LOW (ref >60)
Glucose, Bld: 97 mg/dL (ref 65–99)
Potassium: 4.8 mmol/L (ref 3.5–5.1)
Sodium: 138 mmol/L (ref 135–145)

## 2016-07-13 LAB — MAGNESIUM: Magnesium: 2 mg/dL (ref 1.7–2.4)

## 2016-07-13 MED ORDER — ADULT MULTIVITAMIN W/MINERALS CH
1.0000 | ORAL_TABLET | Freq: Every day | ORAL | Status: DC
  Administered 2016-07-13 – 2016-07-16 (×3): 1 via ORAL
  Filled 2016-07-13 (×3): qty 1

## 2016-07-13 MED ORDER — CEFAZOLIN SODIUM-DEXTROSE 2-4 GM/100ML-% IV SOLN
2.0000 g | INTRAVENOUS | Status: DC
  Administered 2016-07-14: 2 g via INTRAVENOUS

## 2016-07-13 MED ORDER — ENOXAPARIN SODIUM 40 MG/0.4ML ~~LOC~~ SOLN: 40.0000 mg | SUBCUTANEOUS | Status: DC

## 2016-07-13 MED ORDER — CHLORHEXIDINE GLUCONATE 4 % EX LIQD
60.0000 mL | Freq: Once | CUTANEOUS | Status: AC
  Administered 2016-07-14: 4 via TOPICAL

## 2016-07-13 MED ORDER — SODIUM CHLORIDE 0.9 % IV SOLN
INTRAVENOUS | Status: AC
  Administered 2016-07-13: 18:00:00 via INTRAVENOUS
  Filled 2016-07-13 (×2): qty 1000

## 2016-07-13 MED ORDER — SODIUM CHLORIDE 0.9 % IV SOLN
INTRAVENOUS | Status: DC
  Administered 2016-07-14: 08:00:00 via INTRAVENOUS

## 2016-07-13 MED ORDER — POVIDONE-IODINE 10 % EX SWAB: 2.0000 "application " | Freq: Once | CUTANEOUS | Status: DC

## 2016-07-13 MED ORDER — ENSURE ENLIVE PO LIQD
237.0000 mL | Freq: Two times a day (BID) | ORAL | Status: DC
  Administered 2016-07-13 – 2016-07-16 (×3): 237 mL via ORAL

## 2016-07-13 NOTE — Progress Notes (Signed)
Physical Therapy Treatment Patient Details Name: Tom Ortiz MRN: [id number] DOB: [birth year] Today's Date: 07/13/2016    History of Present Illness 83 y.o. male admitted following a mechanical fall with resulting Lt hip pain. Fracture found at left greater trochanter to the subtrochanteric region. Atempting conservative management at this time. PMH: atrial fib, HTN, anxiety, bilateral THA.     PT Comments    Pt progressing although very slowly d/t pain level; discussed fx location with pt and explained possible procedures surgeon may use to repair fx LLE; defer further info to MD; pt reports to RN and  PT that he wants to have surgery  Follow Up Recommendations  SNF;Supervision/Assistance - 24 hour     Equipment Recommendations  None recommended by PT    Recommendations for Other Services       Precautions / Restrictions Precautions Precautions: Fall Precaution Comments: Lt hip fx--tends to maintain LLE in external rotation Restrictions Weight Bearing Restrictions: Yes LLE Weight Bearing: Partial weight bearing LLE Partial Weight Bearing Percentage or Pounds: 25    Mobility  Bed Mobility Overal bed mobility: Needs Assistance Bed Mobility: Supine to Sit     Supine to sit: +2 for physical assistance;Max assist;Mod assist     General bed mobility comments: transition slow and guarded and painful; multi-modal cues to self assist  Transfers Overall transfer level: Needs assistance Equipment used: Rolling walker (2 wheeled) Transfers: Sit to/from [facility] Sit to Stand: +2 physical assistance;Mod assist Stand pivot transfers: +2 physical assistance;Mod assist       General transfer comment: multi-modal cues safety, use of UEs to maintain 25% WBing and allow improved pain control; assist throughout to maintain balance and complete pivot to chair  Ambulation/Gait                 Stairs            Wheelchair Mobility    Modified  [place] (Stroke Patients Only)       Balance   Sitting-balance support: Single extremity supported;Feet supported Sitting balance-Leahy Scale: Fair     Standing balance support: Bilateral upper extremity supported Standing balance-Leahy Scale: Poor Standing balance comment: requiring use of UEs and rw                            Cognition Arousal/Alertness: Awake/alert Behavior During Therapy: WFL for tasks assessed/performed Overall Cognitive Status: Within Functional Limits for tasks assessed                                        Exercises      General Comments        Pertinent Vitals/Pain Pain Assessment: Faces Faces Pain Scale: Hurts whole lot Pain Location: Lt hip Pain Descriptors / Indicators: Grimacing;Guarding;Moaning Pain Intervention(s): Limited activity within patient's tolerance;Monitored during session;Premedicated before session    Home Living                      Prior Function            PT Goals (current goals can now be found in the care plan section) Acute Rehab PT Goals Patient Stated Goal: have less pain PT Goal Formulation: With patient Time For Goal Achievement: 07/26/16 Potential to Achieve Goals: Fair Progress towards PT goals: Progressing toward goals    Frequency  Min 2X/week (incr frequency after surgery)      PT Plan Current plan remains appropriate    Co-evaluation              AM-PAC PT "6 Clicks" Daily Activity  Outcome Measure  Difficulty turning over in bed (including adjusting bedclothes, sheets and blankets)?: A Lot Difficulty moving from lying on back to sitting on the side of the bed? : A Lot Difficulty sitting down on and standing up from a chair with arms (e.g., wheelchair, bedside commode, etc,.)?: A Lot Help needed moving to and from a bed to chair (including a wheelchair)?: Total Help needed walking in hospital room?: Total Help needed climbing 3-5 steps with a  railing? : Total 6 Click Score: 9    End of Session Equipment Utilized During Treatment: Gait belt Activity Tolerance: Patient limited by pain Patient left: in chair;with call bell/phone within reach   PT Visit Diagnosis: Unsteadiness on feet (R26.81);Muscle weakness (generalized) (M62.81);History of falling (Z91.81);Difficulty in walking, not elsewhere classified (R26.2);Pain Pain - Right/Left: Left Pain - part of body: Hip     Time: 2761-8485 PT Time Calculation (min) (ACUTE ONLY): 28 min  Charges:  $Therapeutic Activity: 23-37 mins                    G CodesKenyon Ana, PT Pager: [phone] 07/13/2016    Kenyon Ana 07/13/2016, 1:28 PM

## 2016-07-13 NOTE — Progress Notes (Signed)
Plan for d/c to SNF, discharge planning per CSW. [phone] 

## 2016-07-13 NOTE — Progress Notes (Signed)
Patient ID: Tom Ortiz, male   DOB: Apr 05, 1927, 83 y.o.   MRN: [id number]  Left peri-prosthetic proximal femur fracture with stable prosthesis  He is having a lot of trouble with the pain that fracture is causing.  Significant challenge to move or weight bear  LLE externally rotated more likely related to pain than fracture  Difficulty problem for him to try and address.  An operation would provide fracture stability and may help to reduce pain versus continued non-operative observation He wishes to discuss with his wife the options  Otherwise attempt to get up with therapy, regular diet, etc If the ultimate decision is to operate then we can arrange as soon as possible  I have discussed with his nurse today to try and facilitate discussion with his wife

## 2016-07-13 NOTE — Progress Notes (Signed)
PROGRESS NOTE  Tom Ortiz GXQ:[id number] DOB: 1927/04/18 DOA: 07/11/2016 PCP: Thressa Sheller, MD  Brief History:  83 year old male with a history of osteoporosis, paroxysmal atrial fibrillation, hypertension, MGUS present Dr. Randel Books fall on the morning of 07/11/2016. The patient states that his left leg "just gave out"as he was pivoting to move. He denies loss of consciousness. The patient also states that he has had 2 other falls this past month which she relates to gait instability and weakness of his left leg. He has no problems with his left leg for at least 1 year.  Patient denies fevers, chills, headache, chest pain, dyspnea, nausea, vomiting, diarrhea, abdominal pain, dysuria, hematuria, hematochezia, and melena. X-rays of the left hip revealed an oblique fracture of the left femur from the left greater trochanter to the subtrochanteric region. Orthopedics was consulted to assist with management.   Assessment/Plan: Left trochanteric fracture -Appreciate orthopedic consult-->surgical options reconsidered due to incident pain with weightbearing and ambulation -plan for surgery 07/14/16 -Pain control -PT evaluation-->SNF -stool softener -DVT prophylaxis per ortho  Essential Hypertension -continue metoprolol succinate -BP elevation in part due to pain -BP acceptable  Paroxysmal Atrial Fibrillation -CHADS-VASc = 3 -pt last saw Dr. Sallyanne Kuster 04/28/14-->started on rivaroxaban -spouse states that pt never took the rivaroxaban due to his concerns about bleeding with anticoagulation -pt then lost to follow up and decided not to followup -presently in sinus rhythm -will need out patient cardiology follow up -continue ASA  Urine retention -likely due to opioids -since surgery planned 07/14/16-->place foley now -plan voiding trial after surgery  MGUS -previously saw Dr. Marin Olp 07/13/14 -CBC stable   Disposition Plan:   surgery planned 07/14/16 Family  Communication:  spouse and son at bedside updated 4/30  Consultants: Ortho   Code Status:  FULL  DVT Prophylaxis:  [place] Lovenox  Previous notes and labs reviewed and summarized.  Total time spent 35 minutes.  Greater than 50% spent face to face counseling and coordinating care.    Procedures: As Listed in Progress Note Above  Antibiotics: None   Subjective: Patient complains of significant pain in the left leg with any type of ambulation or weightbearing. Denies any fevers, chills, chest pain, shortness breath, nausea, vomiting, diarrhea, abdominal pain, dysuria, hematuria.  Objective: Vitals:   07/12/16 2100 07/12/16 2103 07/13/16 0535 07/13/16 1354  BP: (!) 166/60 (!) 159/73 (!) 150/70 (!) 153/67  Pulse: 68 67 62 65  Resp: 16 16 16 16   Temp: 98.3 F (36.8 C) 98.2 F (36.8 C) 98.1 F (36.7 C) 97.8 F (36.6 C)  TempSrc: Oral Oral Oral Oral  SpO2: 97% 97% 97% 98%  Weight:      Height:        Intake/Output Summary (Last 24 hours) at 07/13/16 1537 Last data filed at 07/13/16 1331  Gross per 24 hour  Intake              720 ml  Output              550 ml  Net              170 ml   Weight change:  Exam:   General:  Pt is alert, follows commands appropriately, not in acute distress  HEENT: No icterus, No thrush, No neck mass, [place]/AT  Cardiovascular: RRR, S1/S2, no rubs, no gallops  Respiratory: CTA bilaterally, no wheezing, no crackles, no rhonchi  Abdomen: Soft/+BS, non tender, non distended, no guarding  Extremities: No edema, No lymphangitis, No petechiae, No rashes, no synovitis   Data Reviewed: I have personally reviewed following labs and imaging studies Basic Metabolic Panel:  Recent Labs Lab 07/11/16 1206 07/13/16 0433  NA 137 138  K 4.5 4.8  CL 104 104  CO2 27 29  GLUCOSE 113* 97  BUN 20 32*  CREATININE 1.05 1.23  CALCIUM 8.6* 8.4*  MG  --  2.0   Liver Function Tests: No results for input(s): AST, ALT, ALKPHOS, BILITOT, PROT,  ALBUMIN in the last 168 hours. No results for input(s): LIPASE, AMYLASE in the last 168 hours. No results for input(s): AMMONIA in the last 168 hours. Coagulation Profile:  Recent Labs Lab 07/11/16 1206  INR 1.20   CBC:  Recent Labs Lab 07/11/16 1206  WBC 7.4  HGB 12.2*  HCT 36.1*  MCV 97.0  PLT 185   Cardiac Enzymes: No results for input(s): CKTOTAL, CKMB, CKMBINDEX, TROPONINI in the last 168 hours. BNP: Invalid input(s): POCBNP CBG: No results for input(s): GLUCAP in the last 168 hours. HbA1C: No results for input(s): HGBA1C in the last 72 hours. Urine analysis:    Component Value Date/Time   COLORURINE YELLOW 01/22/2012 [place] 01/22/2012 1216   LABSPEC 1.025 01/22/2012 1216   PHURINE 5.5 01/22/2012 1216   GLUCOSEU NEGATIVE 01/22/2012 1216   HGBUR NEGATIVE 01/22/2012 1216   BILIRUBINUR NEGATIVE 01/22/2012 1216   KETONESUR NEGATIVE 01/22/2012 1216   PROTEINUR NEGATIVE 01/22/2012 1216   UROBILINOGEN 0.2 01/22/2012 1216   NITRITE NEGATIVE 01/22/2012 1216   LEUKOCYTESUR NEGATIVE 01/22/2012 1216   Sepsis Labs: @LABRCNTIP (procalcitonin:4,lacticidven:4) ) Recent Results (from the past 240 hour(s))  Surgical pcr screen     Status: None   Collection Time: 07/11/16  3:39 PM  Result Value Ref Range Status   MRSA, PCR NEGATIVE NEGATIVE Final   Staphylococcus aureus NEGATIVE NEGATIVE Final    Comment:        The Xpert SA Assay (FDA approved for NASAL specimens in patients over 32 years of age), is one component of a comprehensive surveillance program.  Test performance has been validated by [facility] for patients greater than or equal to 81 year old. It is not intended to diagnose infection nor to guide or monitor treatment.      Scheduled Meds: . aspirin  81 mg Oral Daily  . docusate sodium  100 mg Oral BID  . feeding supplement (ENSURE ENLIVE)  237 mL Oral BID BM  . metoprolol succinate  25 mg Oral BID  . multivitamin with minerals   1 tablet Oral Daily  . senna  1 tablet Oral Daily   Continuous Infusions: . sodium chloride 20 mL/hr at 07/11/16 1213  . methocarbamol (ROBAXIN)  IV Stopped (07/12/16 1023)    Procedures/Studies: Dg Chest 1 View  Result Date: 07/11/2016 CLINICAL DATA:  Recent falling.  Left hip pain after today's fall. EXAM: CHEST 1 VIEW COMPARISON:  12/08/2011 FINDINGS: Lungs are clear without airspace disease or pulmonary edema. Slightly low lung volumes with central vascular crowding. Atherosclerotic calcifications at the aortic arch. There are chronic round calcifications around the proximal left humerus. Severe joint space narrowing and degenerative changes at the left shoulder joint. Fullness in the right paratracheal region probably related to vascular crowding and slightly low lung volumes. IMPRESSION: Slightly low lung volumes.  No acute chest findings. Electronically Signed   By: Markus Daft M.D.   On: 07/11/2016 12:15   Dg Hip Unilat With Pelvis 2-3  Views Left  Result Date: 07/11/2016 CLINICAL DATA:  Status post fall.  Left hip pain. EXAM: DG HIP (WITH OR WITHOUT PELVIS) 2-3V LEFT COMPARISON:  None. FINDINGS: Right total hip arthroplasty without failure complication. Left total hip arthroplasty. Acute oblique fracture extending from the left greater trochanter, inferomedially into the subtrochanteric region. 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 Generalized osteopenia. Mild osteoarthritis of bilateral sacroiliac joints. IMPRESSION: Left total hip arthroplasty. Acute oblique fracture extending from the left greater trochanter, inferomedially into the subtrochanteric region. 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 Electronically Signed   By: Kathreen Devoid   On: 07/11/2016 12:14    TAT, DAVID, DO  [facility] Pager [phone]  If 7PM-7AM,  please contact night-coverage [email or web address] Password [email or web address] 07/13/2016, 3:37 PM   LOS: 2 days

## 2016-07-13 NOTE — Progress Notes (Addendum)
Initial Nutrition Assessment  DOCUMENTATION CODES:   Not applicable  INTERVENTION:   Ensure Enlive po BID, each supplement provides 350 kcal and 20 grams of protein  MVI  NUTRITION DIAGNOSIS:   Increased nutrient needs related to other (see comment) (Hip fracture ) as evidenced by increased estimated needs from protein.  GOAL:   Patient will meet greater than or equal to 90% of their needs  MONITOR:   PO intake, Supplement acceptance, Labs, Weight trends  REASON FOR ASSESSMENT:   Consult Hip fracture protocol  ASSESSMENT:   83 year old male with a history of osteoporosis, paroxysmal atrial fibrillation, hypertension, MGUS present Dr. Randel Books fall on the morning of 07/11/2016.  Pt found to have hip fracture   Met with pt in room today. Pt reports slow decline in appetite for the past 10 years. Pt reports that he has had multiple falls and surgeries and just doesn't have a taste for food anymore. Pt documented eating 50-100% of meals. RD discussed with pt the importance of adequate protein intake for healing. Pt is willing to try Ensure. Per chart, pt is weight stable. Pt scheduled to have surgery tomorrow.   Medications reviewed and include: aspirin, colace, senokot, hydrocodone  Labs reviewed: BUN 32(H), Ca 8.4(L)  Nutrition-Focused physical exam completed. Findings are mild fat depletion in chest and upper arms, moderate muscle depletion in scapula and shoulders, and moderate edema.   Diet Order:  Diet regular Room service appropriate? Yes; Fluid consistency: Thin Diet NPO time specified  Skin:  Reviewed, no issues  Last BM:  4/26  Height:   Ht Readings from Last 1 Encounters:  07/11/16 5' 10"  (1.778 m)    Weight:   Wt Readings from Last 1 Encounters:  07/11/16 165 lb (74.8 kg)    Ideal Body Weight:  75.4 kg  BMI:  Body mass index is 23.68 kg/m.  Estimated Nutritional Needs:   Kcal:  1900-2200kcal/day   Protein:  83-98g/day   Fluid:   >1.9L/day   EDUCATION NEEDS:   No education needs identified at this time  Koleen Distance, RD, LDN Pager #[phone]

## 2016-07-13 NOTE — Progress Notes (Signed)
OT Cancellation Note  Patient Details Name: Tom Ortiz MRN: [id number] DOB: [birth year]   Cancelled Treatment:    Reason Eval/Treat Not Completed: Other (comment) Spoke with PT regarding pt.  Will await decision regarding surgery prior to OT eval.  Kari Baars, [place]  Tom Ortiz D 07/13/2016, 12:37 PM

## 2016-07-13 NOTE — Progress Notes (Signed)
CSW met with pt / spouse at bedside today to assist with d/c planning. Pt reports that he will have hip surgery tomorrow afternoon. Pt / spouse have chosen [facility] for [facility] at d/c. Additional bed offers provided in case [facility] has no openings when pt is ready for d/c. CSW will continue to follow to assist with d/c planning needs.  Werner Lean LCSW [phone]

## 2016-07-14 ENCOUNTER — Inpatient Hospital Stay (HOSPITAL_COMMUNITY): Payer: Medicare Other

## 2016-07-14 ENCOUNTER — Encounter (HOSPITAL_COMMUNITY)
Admission: EM | Disposition: A | Discharge status: Skilled Nursing Facility | Payer: Self-pay | Source: Home / Self Care | Attending: Internal Medicine

## 2016-07-14 ENCOUNTER — Inpatient Hospital Stay (HOSPITAL_COMMUNITY): Payer: Medicare Other | Admitting: Anesthesiology

## 2016-07-14 ENCOUNTER — Encounter (HOSPITAL_COMMUNITY): Payer: Self-pay | Admitting: *Deleted

## 2016-07-14 DIAGNOSIS — M25512 Pain in left shoulder: Secondary | ICD-10-CM

## 2016-07-14 HISTORY — DX: Pain in left shoulder: M25.512

## 2016-07-14 HISTORY — PX: ORIF PERIPROSTHETIC FRACTURE: SHX5034

## 2016-07-14 LAB — TYPE AND SCREEN
ABO/RH(D): O POS
Antibody Screen: NEGATIVE

## 2016-07-14 LAB — BASIC METABOLIC PANEL
Anion gap: 5 (ref 5–15)
BUN: 27 mg/dL — ABNORMAL HIGH (ref 6–20)
CO2: 28 mmol/L (ref 22–32)
Calcium: 8.4 mg/dL — ABNORMAL LOW (ref 8.9–10.3)
Chloride: 104 mmol/L (ref 101–111)
Creatinine, Ser: 0.98 mg/dL (ref 0.61–1.24)
GFR calc Af Amer: >60 mL/min (ref >60)
GFR calc non Af Amer: >60 mL/min (ref >60)
Glucose, Bld: 98 mg/dL (ref 65–99)
Potassium: 4.5 mmol/L (ref 3.5–5.1)
Sodium: 137 mmol/L (ref 135–145)

## 2016-07-14 LAB — MRSA PCR SCREENING: MRSA by PCR: NEGATIVE

## 2016-07-14 SURGERY — OPEN REDUCTION INTERNAL FIXATION (ORIF) PERIPROSTHETIC FRACTURE
Anesthesia: General | Site: Hip | Laterality: Left

## 2016-07-14 MED ORDER — PROMETHAZINE HCL 25 MG/ML IJ SOLN
INTRAMUSCULAR | Status: AC
  Filled 2016-07-14: qty 1

## 2016-07-14 MED ORDER — LACTATED RINGERS IV SOLN
INTRAVENOUS | Status: DC
  Administered 2016-07-14 (×2): via INTRAVENOUS
  Administered 2016-07-14: 1000 mL via INTRAVENOUS

## 2016-07-14 MED ORDER — PROMETHAZINE HCL 25 MG/ML IJ SOLN: 6.2500 mg | Freq: Once | INTRAMUSCULAR | Status: DC

## 2016-07-14 MED ORDER — ASPIRIN 81 MG PO CHEW
81.0000 mg | CHEWABLE_TABLET | Freq: Two times a day (BID) | ORAL | Status: DC
  Administered 2016-07-14 – 2016-07-16 (×4): 81 mg via ORAL
  Filled 2016-07-14 (×4): qty 1

## 2016-07-14 MED ORDER — 0.9 % SODIUM CHLORIDE (POUR BTL) OPTIME
TOPICAL | Status: DC | PRN
  Administered 2016-07-14: 1000 mL

## 2016-07-14 MED ORDER — KETOROLAC TROMETHAMINE 30 MG/ML IJ SOLN
INTRAMUSCULAR | Status: AC
  Filled 2016-07-14: qty 1

## 2016-07-14 MED ORDER — SODIUM CHLORIDE 0.9 % IV SOLN
INTRAVENOUS | Status: DC
  Administered 2016-07-14 – 2016-07-15 (×2): via INTRAVENOUS

## 2016-07-14 MED ORDER — DOCUSATE SODIUM 100 MG PO CAPS: 100.0000 mg | ORAL_CAPSULE | Freq: Two times a day (BID) | ORAL | Status: DC

## 2016-07-14 MED ORDER — OXYCODONE HCL 5 MG PO TABS: 5.0000 mg | ORAL_TABLET | Freq: Once | ORAL | Status: DC | PRN

## 2016-07-14 MED ORDER — METHOCARBAMOL 500 MG PO TABS
500.0000 mg | ORAL_TABLET | Freq: Four times a day (QID) | ORAL | Status: DC | PRN
  Administered 2016-07-15 (×2): 500 mg via ORAL
  Filled 2016-07-14 (×2): qty 1

## 2016-07-14 MED ORDER — BUPIVACAINE HCL (PF) 0.25 % IJ SOLN
INTRAMUSCULAR | Status: AC
  Filled 2016-07-14: qty 30

## 2016-07-14 MED ORDER — FERROUS SULFATE 325 (65 FE) MG PO TABS
325.0000 mg | ORAL_TABLET | Freq: Two times a day (BID) | ORAL | Status: DC
  Administered 2016-07-15 – 2016-07-16 (×3): 325 mg via ORAL
  Filled 2016-07-14 (×3): qty 1

## 2016-07-14 MED ORDER — LIDOCAINE 2% (20 MG/ML) 5 ML SYRINGE
INTRAMUSCULAR | Status: AC
  Filled 2016-07-14: qty 5

## 2016-07-14 MED ORDER — METOCLOPRAMIDE HCL 5 MG/ML IJ SOLN: 5.0000 mg | Freq: Three times a day (TID) | INTRAMUSCULAR | Status: DC | PRN

## 2016-07-14 MED ORDER — ALUM & MAG HYDROXIDE-SIMETH 200-200-20 MG/5ML PO SUSP: 30.0000 mL | ORAL | Status: DC | PRN

## 2016-07-14 MED ORDER — METOCLOPRAMIDE HCL 5 MG PO TABS: 5.0000 mg | ORAL_TABLET | Freq: Three times a day (TID) | ORAL | Status: DC | PRN

## 2016-07-14 MED ORDER — ONDANSETRON HCL 4 MG/2ML IJ SOLN: 4.0000 mg | Freq: Four times a day (QID) | INTRAMUSCULAR | Status: DC | PRN

## 2016-07-14 MED ORDER — ASPIRIN EC 325 MG PO TBEC: 325.0000 mg | DELAYED_RELEASE_TABLET | Freq: Two times a day (BID) | ORAL | Status: DC

## 2016-07-14 MED ORDER — ONDANSETRON HCL 4 MG/2ML IJ SOLN
INTRAMUSCULAR | Status: AC
  Filled 2016-07-14: qty 2

## 2016-07-14 MED ORDER — SUGAMMADEX SODIUM 200 MG/2ML IV SOLN
INTRAVENOUS | Status: DC | PRN
  Administered 2016-07-14: 150 mg via INTRAVENOUS

## 2016-07-14 MED ORDER — FENTANYL CITRATE (PF) 100 MCG/2ML IJ SOLN
INTRAMUSCULAR | Status: AC
  Filled 2016-07-14: qty 2

## 2016-07-14 MED ORDER — FENTANYL CITRATE (PF) 100 MCG/2ML IJ SOLN
INTRAMUSCULAR | Status: DC | PRN
  Administered 2016-07-14: 25 ug via INTRAVENOUS
  Administered 2016-07-14: 50 ug via INTRAVENOUS
  Administered 2016-07-14: 25 ug via INTRAVENOUS

## 2016-07-14 MED ORDER — FERROUS SULFATE 325 (65 FE) MG PO TABS: 325.0000 mg | ORAL_TABLET | Freq: Three times a day (TID) | ORAL | Status: DC

## 2016-07-14 MED ORDER — SODIUM CHLORIDE 0.9 % IJ SOLN
INTRAMUSCULAR | Status: AC
  Filled 2016-07-14: qty 50

## 2016-07-14 MED ORDER — CEFAZOLIN SODIUM-DEXTROSE 2-4 GM/100ML-% IV SOLN
2.0000 g | Freq: Four times a day (QID) | INTRAVENOUS | Status: AC
  Administered 2016-07-14 – 2016-07-15 (×2): 2 g via INTRAVENOUS
  Filled 2016-07-14 (×2): qty 100

## 2016-07-14 MED ORDER — ONDANSETRON HCL 4 MG PO TABS: 4.0000 mg | ORAL_TABLET | Freq: Four times a day (QID) | ORAL | Status: DC | PRN

## 2016-07-14 MED ORDER — ROCURONIUM BROMIDE 50 MG/5ML IV SOSY
PREFILLED_SYRINGE | INTRAVENOUS | Status: AC
  Filled 2016-07-14: qty 5

## 2016-07-14 MED ORDER — LIDOCAINE HCL (CARDIAC) 20 MG/ML IV SOLN
INTRAVENOUS | Status: DC | PRN
  Administered 2016-07-14: 70 mg via INTRAVENOUS

## 2016-07-14 MED ORDER — ACETAMINOPHEN 650 MG RE SUPP: 650.0000 mg | Freq: Four times a day (QID) | RECTAL | Status: DC | PRN

## 2016-07-14 MED ORDER — PROPOFOL 10 MG/ML IV BOLUS
INTRAVENOUS | Status: AC
  Filled 2016-07-14: qty 20

## 2016-07-14 MED ORDER — PHENOL 1.4 % MT LIQD
1.0000 | OROMUCOSAL | Status: DC | PRN
  Filled 2016-07-14: qty 177

## 2016-07-14 MED ORDER — FENTANYL CITRATE (PF) 250 MCG/5ML IJ SOLN
INTRAMUSCULAR | Status: AC
  Filled 2016-07-14: qty 5

## 2016-07-14 MED ORDER — OXYCODONE HCL 5 MG/5ML PO SOLN
5.0000 mg | Freq: Once | ORAL | Status: DC | PRN
  Filled 2016-07-14: qty 5

## 2016-07-14 MED ORDER — PROPOFOL 10 MG/ML IV BOLUS
INTRAVENOUS | Status: DC | PRN
  Administered 2016-07-14: 100 mg via INTRAVENOUS

## 2016-07-14 MED ORDER — METHOCARBAMOL 1000 MG/10ML IJ SOLN
500.0000 mg | Freq: Four times a day (QID) | INTRAMUSCULAR | Status: DC | PRN
  Administered 2016-07-14: 500 mg via INTRAVENOUS
  Filled 2016-07-14: qty 550

## 2016-07-14 MED ORDER — HYDROCODONE-ACETAMINOPHEN 5-325 MG PO TABS: 1.0000 | ORAL_TABLET | Freq: Four times a day (QID) | ORAL | 0 refills | Status: DC | PRN

## 2016-07-14 MED ORDER — SUGAMMADEX SODIUM 200 MG/2ML IV SOLN
INTRAVENOUS | Status: AC
  Filled 2016-07-14: qty 2

## 2016-07-14 MED ORDER — MENTHOL 3 MG MT LOZG: 1.0000 | LOZENGE | OROMUCOSAL | Status: DC | PRN

## 2016-07-14 MED ORDER — ROCURONIUM BROMIDE 100 MG/10ML IV SOLN
INTRAVENOUS | Status: DC | PRN
  Administered 2016-07-14: 50 mg via INTRAVENOUS

## 2016-07-14 MED ORDER — PHENOL 1.4 % MT LIQD: 1.0000 | OROMUCOSAL | Status: DC | PRN

## 2016-07-14 MED ORDER — ACETAMINOPHEN 10 MG/ML IV SOLN: 1000.0000 mg | Freq: Once | INTRAVENOUS | Status: DC | PRN

## 2016-07-14 MED ORDER — CEFAZOLIN SODIUM-DEXTROSE 1-4 GM/50ML-% IV SOLN: 1.0000 g | Freq: Four times a day (QID) | INTRAVENOUS | Status: DC

## 2016-07-14 MED ORDER — ONDANSETRON HCL 4 MG/2ML IJ SOLN
INTRAMUSCULAR | Status: DC | PRN
  Administered 2016-07-14: 4 mg via INTRAVENOUS

## 2016-07-14 MED ORDER — PHENYLEPHRINE HCL 10 MG/ML IJ SOLN
INTRAMUSCULAR | Status: DC | PRN
  Administered 2016-07-14: 80 ug via INTRAVENOUS
  Administered 2016-07-14: 60 ug via INTRAVENOUS
  Administered 2016-07-14 (×2): 80 ug via INTRAVENOUS

## 2016-07-14 MED ORDER — METHOCARBAMOL 500 MG PO TABS: 500.0000 mg | ORAL_TABLET | Freq: Four times a day (QID) | ORAL | 0 refills | Status: DC

## 2016-07-14 MED ORDER — ACETAMINOPHEN 325 MG PO TABS: 650.0000 mg | ORAL_TABLET | Freq: Four times a day (QID) | ORAL | Status: DC | PRN

## 2016-07-14 MED ORDER — CEFAZOLIN SODIUM-DEXTROSE 2-4 GM/100ML-% IV SOLN
INTRAVENOUS | Status: AC
  Filled 2016-07-14: qty 100

## 2016-07-14 MED ORDER — ASPIRIN 325 MG PO TABS: 325.0000 mg | ORAL_TABLET | Freq: Two times a day (BID) | ORAL | 0 refills | Status: AC

## 2016-07-14 MED ORDER — FENTANYL CITRATE (PF) 100 MCG/2ML IJ SOLN
25.0000 ug | INTRAMUSCULAR | Status: DC | PRN
Start: 2016-07-14 — End: 2016-07-14
  Administered 2016-07-14 (×3): 50 ug via INTRAVENOUS

## 2016-07-14 SURGICAL SUPPLY — 45 items
ADH SKN CLS APL DERMABOND .7 (GAUZE/BANDAGES/DRESSINGS) ×1
BAG SPEC THK2 15X12 ZIP CLS (MISCELLANEOUS) ×1
BAG ZIPLOCK 12X15 (MISCELLANEOUS) ×2 IMPLANT
CABLE ASSY CERCLAGE SST 1.8X55 (Orthopedic Implant) ×2 IMPLANT
CABLE CERLAGE W/CRIMP 1.8 (Cable) ×2 IMPLANT
COVER MAYO STAND STRL (DRAPES) ×2 IMPLANT
COVER SURGICAL LIGHT HANDLE (MISCELLANEOUS) ×2 IMPLANT
DERMABOND ADVANCED (GAUZE/BANDAGES/DRESSINGS) ×1
DERMABOND ADVANCED .7 DNX12 (GAUZE/BANDAGES/DRESSINGS) IMPLANT
DRAPE INCISE IOBAN 66X45 STRL (DRAPES) ×2 IMPLANT
DRAPE ORTHO SPLIT 77X108 STRL (DRAPES) ×4
DRAPE POUCH INSTRU U-SHP 10X18 (DRAPES) ×2 IMPLANT
DRAPE SURG 17X11 SM STRL (DRAPES) ×2 IMPLANT
DRAPE SURG ORHT 6 SPLT 77X108 (DRAPES) ×2 IMPLANT
DRAPE U-SHAPE 47X51 STRL (DRAPES) ×2 IMPLANT
DRESSING AQUACEL AG SP 3.5X10 (GAUZE/BANDAGES/DRESSINGS) IMPLANT
DRSG AQUACEL AG SP 3.5X10 (GAUZE/BANDAGES/DRESSINGS) ×2
DURAPREP 26ML APPLICATOR (WOUND CARE) ×2 IMPLANT
ELECT BLADE TIP CTD 4 INCH (ELECTRODE) ×2 IMPLANT
ELECT REM PT RETURN 15FT ADLT (MISCELLANEOUS) ×2 IMPLANT
EVACUATOR 1/8 PVC DRAIN (DRAIN) ×2 IMPLANT
FACESHIELD WRAPAROUND (MASK) ×8 IMPLANT
FACESHIELD WRAPAROUND OR TEAM (MASK) ×4 IMPLANT
GLOVE BIOGEL PI IND STRL 6.5 (GLOVE) IMPLANT
GLOVE BIOGEL PI IND STRL 7.5 (GLOVE) ×1 IMPLANT
GLOVE BIOGEL PI INDICATOR 6.5 (GLOVE) ×1
GLOVE BIOGEL PI INDICATOR 7.5 (GLOVE) ×3
GLOVE ORTHO TXT STRL SZ7.5 (GLOVE) ×3 IMPLANT
GLOVE SURG SS PI 6.5 STRL IVOR (GLOVE) ×1 IMPLANT
GLOVE SURG SS PI 7.5 STRL IVOR (GLOVE) ×1 IMPLANT
GOWN SPEC L3 XXLG W/TWL (GOWN DISPOSABLE) ×2 IMPLANT
GOWN STRL REUS W/TWL LRG LVL3 (GOWN DISPOSABLE) ×3 IMPLANT
KIT BASIN OR (CUSTOM PROCEDURE TRAY) ×2 IMPLANT
MANIFOLD NEPTUNE II (INSTRUMENTS) ×2 IMPLANT
PACK TOTAL JOINT (CUSTOM PROCEDURE TRAY) ×2 IMPLANT
POSITIONER SURGICAL ARM (MISCELLANEOUS) ×2 IMPLANT
SPONGE LAP 18X18 X RAY DECT (DISPOSABLE) ×2 IMPLANT
SPONGE LAP 4X18 X RAY DECT (DISPOSABLE) ×2 IMPLANT
SUCTION FRAZIER HANDLE 10FR (MISCELLANEOUS) ×1
SUCTION TUBE FRAZIER 10FR DISP (MISCELLANEOUS) ×1 IMPLANT
SUT MNCRL AB 3-0 PS2 18 (SUTURE) ×2 IMPLANT
SUT VIC AB 1 CT1 36 (SUTURE) ×5 IMPLANT
SUT VIC AB 2-0 CT1 27 (SUTURE) ×6
SUT VIC AB 2-0 CT1 TAPERPNT 27 (SUTURE) ×3 IMPLANT
TOWEL OR 17X26 10 PK STRL BLUE (TOWEL DISPOSABLE) ×4 IMPLANT

## 2016-07-14 NOTE — Progress Notes (Addendum)
PROGRESS NOTE  FODAY CONE MWN:[id number] DOB: Aug 06, 1927 DOA: 07/11/2016 PCP: Thressa Sheller, MD  Brief History: 83 year old male with a history of osteoporosis, paroxysmal atrial fibrillation, hypertension, MGUSpresent Dr. Randel Books fall on the morning of 07/11/2016. The patient states that his left leg "just gave out"as he was pivoting to move. He denies loss of consciousness. The patient also states that he has had 2 other falls this past month which she relates to gait instability and weakness of his left leg. He has no problems with his left leg for at least 1 year. Patient denies fevers, chills, headache, chest pain, dyspnea, nausea, vomiting, diarrhea, abdominal pain, dysuria, hematuria, hematochezia, and melena. X-rays of the left hip revealed an oblique fracture of the left femur from the left greater trochanter to the subtrochanteric region. Orthopedics was consulted to assist with management. Initially, the plan was to treat medically with nonoperative management. However the patient continued to have significant pain hindering his progress with physical therapy. On 07/14/2016, patient family agreed for surgical repair.   Assessment/Plan: Left trochanteric fracture -Appreciate orthopedic consult-->surgical options reconsidered due to incident pain with weightbearing and ambulation -plan for surgery 07/14/16 -Pain control -PT evaluation-->SNF -stool softener -DVT prophylaxis per ortho  Essential Hypertension -continue metoprolol succinate -BP elevation in part due to pain -BP acceptable  Paroxysmal Atrial Fibrillation -CHADS-VASc = 3 -pt last saw Dr. Sallyanne Kuster 04/28/14-->started on rivaroxaban -spouse states that pt never took the rivaroxaban due to his concerns about bleeding with anticoagulation -pt then lost to follow up and decided not to followup -presently in sinus rhythm -will need out patient cardiology follow up -continue ASA -04/19/2014 echo EF  60-65%, grade 1 DD, no WMA  Urine retention -likely due to opioids -since surgery planned 07/14/16-->placed foley 07/13/16 late afternoon -plan voiding trial after surgery  Dehydration -improved with IVF (I initially started 4/30 @75  cc/hr) -baseline creatinine 0.8-1.0 -serum creatinine peaked 1.23>>>>0.98  MGUS -previously saw Dr. Marin Olp 07/13/14 -CBC stable   Disposition Plan: SNF 5/2 or 5/3 Family Communication: no family at bedside   Consultants: Ortho  Code Status: FULL  DVT Prophylaxis: Kingsley Lovenox .    Procedures: As Listed in Progress Note Above  Antibiotics: None   Subjective: Patient complains of some nausea this morning. Denies any fevers, chills, chest pain, tightness breath, vomiting, diarrhea, abdominal pain. No dysuria or hematuria. No rashes.  Objective: Vitals:   07/13/16 1354 07/13/16 2147 07/14/16 0128 07/14/16 0456  BP: (!) 153/67 (!) 155/61 (!) 168/80 (!) 169/80  Pulse: 65 70 68 66  Resp: 16 16 16 16   Temp: 97.8 F (36.6 C) 98.6 F (37 C) 98.3 F (36.8 C) 98.9 F (37.2 C)  TempSrc: Oral Oral Oral Oral  SpO2: 98% 99% 99% 97%  Weight:      Height:        Intake/Output Summary (Last 24 hours) at 07/14/16 0958 Last data filed at 07/14/16 0900  Gross per 24 hour  Intake             1615 ml  Output              675 ml  Net              940 ml   Weight change:  Exam:   General:  Pt is alert, follows commands appropriately, not in acute distress  HEENT: No icterus, No thrush, No neck mass, [place]/AT  Cardiovascular: RRR, S1/S2, no rubs, no gallops  Respiratory: fine bibasilar rales, no wheezing, no crackles, no rhonchi  Abdomen: Soft/+BS, non tender, non distended, no guarding  Extremities: No edema, No lymphangitis, No petechiae, No rashes, no synovitis   Data Reviewed: I have personally reviewed following labs and imaging studies Basic Metabolic Panel:  Recent Labs Lab 07/11/16 1206 07/13/16 0433  07/14/16 0506  NA 137 138 137  K 4.5 4.8 4.5  CL 104 104 104  CO2 27 29 28   GLUCOSE 113* 97 98  BUN 20 32* 27*  CREATININE 1.05 1.23 0.98  CALCIUM 8.6* 8.4* 8.4*  MG  --  2.0  --    Liver Function Tests: No results for input(s): AST, ALT, ALKPHOS, BILITOT, PROT, ALBUMIN in the last 168 hours. No results for input(s): LIPASE, AMYLASE in the last 168 hours. No results for input(s): AMMONIA in the last 168 hours. Coagulation Profile:  Recent Labs Lab 07/11/16 1206  INR 1.20   CBC:  Recent Labs Lab 07/11/16 1206  WBC 7.4  HGB 12.2*  HCT 36.1*  MCV 97.0  PLT 185   Cardiac Enzymes: No results for input(s): CKTOTAL, CKMB, CKMBINDEX, TROPONINI in the last 168 hours. BNP: Invalid input(s): POCBNP CBG: No results for input(s): GLUCAP in the last 168 hours. HbA1C: No results for input(s): HGBA1C in the last 72 hours. Urine analysis:    Component Value Date/Time   COLORURINE YELLOW 01/22/2012 [place] 01/22/2012 1216   LABSPEC 1.025 01/22/2012 1216   PHURINE 5.5 01/22/2012 1216   GLUCOSEU NEGATIVE 01/22/2012 1216   HGBUR NEGATIVE 01/22/2012 1216   BILIRUBINUR NEGATIVE 01/22/2012 1216   KETONESUR NEGATIVE 01/22/2012 1216   PROTEINUR NEGATIVE 01/22/2012 1216   UROBILINOGEN 0.2 01/22/2012 1216   NITRITE NEGATIVE 01/22/2012 1216   LEUKOCYTESUR NEGATIVE 01/22/2012 1216   Sepsis Labs: @LABRCNTIP (procalcitonin:4,lacticidven:4) ) Recent Results (from the past 240 hour(s))  Surgical pcr screen     Status: None   Collection Time: 07/11/16  3:39 PM  Result Value Ref Range Status   MRSA, PCR NEGATIVE NEGATIVE Final   Staphylococcus aureus NEGATIVE NEGATIVE Final    Comment:        The Xpert SA Assay (FDA approved for NASAL specimens in patients over 23 years of age), is one component of a comprehensive surveillance program.  Test performance has been validated by [facility] for patients greater than or equal to 63 year old. It is not intended to  diagnose infection nor to guide or monitor treatment.      Scheduled Meds: . aspirin  81 mg Oral Daily  . docusate sodium  100 mg Oral BID  . feeding supplement (ENSURE ENLIVE)  237 mL Oral BID BM  . metoprolol succinate  25 mg Oral BID  . multivitamin with minerals  1 tablet Oral Daily  . povidone-iodine  2 application Topical Once  . senna  1 tablet Oral Daily   Continuous Infusions: . sodium chloride 20 mL/hr at 07/11/16 1213  . sodium chloride 50 mL/hr at 07/14/16 0804  .  ceFAZolin (ANCEF) IV    . methocarbamol (ROBAXIN)  IV 500 mg (07/14/16 0515)  . sodium chloride 0.9 % 1,000 mL infusion 75 mL/hr at 07/13/16 1800    Procedures/Studies: Dg Chest 1 View  Result Date: 07/11/2016 CLINICAL DATA:  Recent falling.  Left hip pain after today's fall. EXAM: CHEST 1 VIEW COMPARISON:  12/08/2011 FINDINGS: Lungs are clear without airspace disease or pulmonary edema. Slightly low lung volumes with central vascular crowding. Atherosclerotic calcifications at  the aortic arch. There are chronic round calcifications around the proximal left humerus. Severe joint space narrowing and degenerative changes at the left shoulder joint. Fullness in the right paratracheal region probably related to vascular crowding and slightly low lung volumes. IMPRESSION: Slightly low lung volumes.  No acute chest findings. Electronically Signed   By: Markus Daft M.D.   On: 07/11/2016 12:15   Dg Hip Unilat With Pelvis 2-3 Views Left  Result Date: 07/11/2016 CLINICAL DATA:  Status post fall.  Left hip pain. EXAM: DG HIP (WITH OR WITHOUT PELVIS) 2-3V LEFT COMPARISON:  None. FINDINGS: Right total hip arthroplasty without failure complication. Left total hip arthroplasty. Acute oblique fracture extending from the left greater trochanter, inferomedially into the subtrochanteric region. 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 Generalized osteopenia.  Mild osteoarthritis of bilateral sacroiliac joints. IMPRESSION: Left total hip arthroplasty. Acute oblique fracture extending from the left greater trochanter, inferomedially into the subtrochanteric region. 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 Electronically Signed   By: Kathreen Devoid   On: 07/11/2016 12:14    TAT, DAVID, DO  [facility] Pager [phone]  If 7PM-7AM, please contact night-coverage [email or web address] Password [email or web address] 07/14/2016, 9:58 AM   LOS: 3 days

## 2016-07-14 NOTE — Anesthesia Preprocedure Evaluation (Addendum)
Anesthesia Evaluation  Patient identified by MRN, date of birth, ID band Patient awake    Reviewed: Allergy & Precautions, NPO status , Patient's Chart, lab work & pertinent test results  Airway Mallampati: II  TM Distance: >3 FB Neck ROM: Full    Dental no notable dental hx.    Pulmonary neg pulmonary ROS, former smoker,    Pulmonary exam normal breath sounds clear to auscultation       Cardiovascular hypertension, Pt. on home beta blockers and Pt. on medications Normal cardiovascular exam+ dysrhythmias Atrial Fibrillation  Rhythm:Regular Rate:Normal  H/O afib   Neuro/Psych negative neurological ROS  negative psych ROS   GI/Hepatic negative GI ROS, Neg liver ROS,   Endo/Other  negative endocrine ROS  Renal/GU negative Renal ROS  negative genitourinary   Musculoskeletal negative musculoskeletal ROS (+)   Abdominal   Peds negative pediatric ROS (+)  Hematology negative hematology ROS (+)   Anesthesia Other Findings   Reproductive/Obstetrics negative OB ROS                             Anesthesia Physical Anesthesia Plan  ASA: III  Anesthesia Plan: General   Post-op Pain Management:    Induction: Intravenous  Airway Management Planned: Oral ETT  Additional Equipment:   Intra-op Plan:   Post-operative Plan: Extubation in OR  Informed Consent: I have reviewed the patients History and Physical, chart, labs and discussed the procedure including the risks, benefits and alternatives for the proposed anesthesia with the patient or authorized representative who has indicated his/her understanding and acceptance.   Dental advisory given  Plan Discussed with: CRNA and Surgeon  Anesthesia Plan Comments:         Anesthesia Quick Evaluation

## 2016-07-14 NOTE — Transfer of Care (Signed)
Immediate Anesthesia Transfer of Care Note  Patient: Tom Ortiz  Procedure(s) Performed: Procedure(s): OPEN REDUCTION INTERNAL FIXATION (ORIF) PERIPROSTHETIC FRACTURE LEFT HIP (Left)  Patient Location: PACU  Anesthesia Type:General  Level of Consciousness: awake, alert  and oriented  Airway & Oxygen Therapy: Patient Spontanous Breathing and Patient connected to face mask oxygen  Post-op Assessment: Report given to RN and Post -op Vital signs reviewed and stable  Post vital signs: Reviewed and stable  Last Vitals:  Vitals:   07/14/16 1000 07/14/16 1222  BP: (!) 168/82 (!) 167/71  Pulse: 63 62  Resp: 17 14  Temp: 36.5 C 36.8 C    Last Pain:  Vitals:   07/14/16 1222  TempSrc: Oral  PainSc:       Patients Stated Pain Goal:  (unable) (37/34/28 7681)  Complications: No apparent anesthesia complications

## 2016-07-14 NOTE — Progress Notes (Signed)
Patient ID: Tom Ortiz, male   DOB: 1927/08/31, 83 y.o.   MRN: [id number]  Doing ok but anxious about today's procedure Still with a lot of pain in left hip with movement  NVI In bed relatively comfortable  Plan: To OR today for ORIF left peri-prosthetic proximal femur fracture After further review of his pain he and his reconsidered and wish to proceed with cable fixation of left hip fracture to help with pain control with activity NPO Consent ordered Post-op expectations reviewed Risks reviewed vs proposed benefits

## 2016-07-14 NOTE — Anesthesia Procedure Notes (Signed)
Procedure Name: Intubation Date/Time: 07/14/2016 1:44 PM Performed by: Glory Buff Pre-anesthesia Checklist: Patient identified, Emergency Drugs available, Suction available and Patient being monitored Patient Re-evaluated:Patient Re-evaluated prior to inductionOxygen Delivery Method: Circle system utilized Preoxygenation: Pre-oxygenation with 100% oxygen Intubation Type: IV induction Ventilation: Mask ventilation without difficulty Laryngoscope Size: Miller and 3 Grade View: Grade I Tube type: Oral Tube size: 7.5 mm Number of attempts: 1 Airway Equipment and Method: Stylet and Oral airway Placement Confirmation: ETT inserted through vocal cords under direct vision,  positive ETCO2 and breath sounds checked- equal and bilateral Secured at: 22 cm Tube secured with: Tape Dental Injury: Teeth and Oropharynx as per pre-operative assessment

## 2016-07-14 NOTE — Anesthesia Postprocedure Evaluation (Signed)
Anesthesia Post Note  Patient: Tom Ortiz Rigor  Procedure(s) Performed: Procedure(s) (LRB): OPEN REDUCTION INTERNAL FIXATION (ORIF) PERIPROSTHETIC FRACTURE LEFT HIP (Left)  Patient location during evaluation: PACU Anesthesia Type: General Level of consciousness: awake and alert and patient cooperative Pain management: pain level controlled Vital Signs Assessment: post-procedure vital signs reviewed and stable Respiratory status: spontaneous breathing and respiratory function stable Cardiovascular status: stable Anesthetic complications: no       Last Vitals:  Vitals:   07/14/16 1545 07/14/16 1630  BP: (!) 210/93 (!) 179/85  Pulse: 63   Resp: 13   Temp:  36.5 C    Last Pain:  Vitals:   07/14/16 1615  TempSrc:   PainSc: [place]

## 2016-07-14 NOTE — Progress Notes (Signed)
OT Cancellation Note  Patient Details Name: Tom Ortiz MRN: [id number] DOB: 1927/09/02   Cancelled Treatment:    Reason Eval/Treat Not Completed: Other (comment).  Pt is scheduled for sx today.  Please reorder OT after this. Thank you.  SPENCER,MARYELLEN 07/14/2016, 7:48 AM  Lesle Chris, OTR/L [phone] 07/14/2016

## 2016-07-15 DIAGNOSIS — W010XXA Fall on same level from slipping, tripping and stumbling without subsequent striking against object, initial encounter: Secondary | ICD-10-CM

## 2016-07-15 DIAGNOSIS — R2681 Unsteadiness on feet: Secondary | ICD-10-CM

## 2016-07-15 DIAGNOSIS — I480 Paroxysmal atrial fibrillation: Secondary | ICD-10-CM

## 2016-07-15 DIAGNOSIS — S72142A Displaced intertrochanteric fracture of left femur, initial encounter for closed fracture: Secondary | ICD-10-CM

## 2016-07-15 DIAGNOSIS — I10 Essential (primary) hypertension: Secondary | ICD-10-CM

## 2016-07-15 LAB — CBC
HCT: 28.8 % — ABNORMAL LOW (ref 39.0–52.0)
Hemoglobin: 9.7 g/dL — ABNORMAL LOW (ref 13.0–17.0)
MCH: 32.9 pg (ref 26.0–34.0)
MCHC: 33.7 g/dL (ref 30.0–36.0)
MCV: 97.6 fL (ref 78.0–100.0)
Platelets: 218 10*3/uL (ref 150–400)
RBC: 2.95 MIL/uL — ABNORMAL LOW (ref 4.22–5.81)
RDW: 14 % (ref 11.5–15.5)
WBC: 6.2 10*3/uL (ref 4.0–10.5)

## 2016-07-15 LAB — BASIC METABOLIC PANEL
Anion gap: 6 (ref 5–15)
BUN: 19 mg/dL (ref 6–20)
CO2: 25 mmol/L (ref 22–32)
Calcium: 7.9 mg/dL — ABNORMAL LOW (ref 8.9–10.3)
Chloride: 104 mmol/L (ref 101–111)
Creatinine, Ser: 0.9 mg/dL (ref 0.61–1.24)
GFR calc Af Amer: >60 mL/min (ref >60)
GFR calc non Af Amer: >60 mL/min (ref >60)
Glucose, Bld: 97 mg/dL (ref 65–99)
Potassium: 4.2 mmol/L (ref 3.5–5.1)
Sodium: 135 mmol/L (ref 135–145)

## 2016-07-15 NOTE — Progress Notes (Signed)
     Subjective: 1 Day Post-Op Procedure(s) (LRB): OPEN REDUCTION INTERNAL FIXATION (ORIF) PERIPROSTHETIC FRACTURE LEFT HIP (Left)   Seen by Dr. Alvan Dame. Patient reports pain as mild, pain controlled.  No reported events throughout the night.   Objective:   VITALS:   Vitals:   07/15/16 0150 07/15/16 0635  BP: (!) 120/57 130/74  Pulse: 79 75  Resp: 14 14  Temp: 100.2 F (37.9 C) 98.6 F (37 C)    Dorsiflexion/Plantar flexion intact Incision: dressing C/D/I No cellulitis present Compartment soft  LABS  Recent Labs  07/15/16 0433  HGB 9.7*  HCT 28.8*  WBC 6.2  PLT 218     Recent Labs  07/13/16 0433 07/14/16 0506 07/15/16 0433  NA 138 137 135  K 4.8 4.5 4.2  BUN 32* 27* 19  CREATININE 1.23 0.98 0.90  GLUCOSE 97 98 97     Assessment/Plan: 1 Day Post-Op Procedure(s) (LRB): OPEN REDUCTION INTERNAL FIXATION (ORIF) PERIPROSTHETIC FRACTURE LEFT HIP (Left) PWB 50% left LE Up with therapy Discharge disposition to be determined  Ortho recommendations:  ASA 325 mg bid for 4 weeks for anticoagulation, unless otherwise medically indicated.  Norco for pain management (Rx written).  Robaxin for muscle spasms (Rx written).  MiraLax and Colace for constipation  Iron 325 mg tid for 2-3 weeks   PWB 50% on the left leg.  Dressing to remain in place until follow in clinic in 2 weeks.  Dressing is waterproof and may shower with it in place.  Follow up in 2 weeks at [facility]. Follow up with OLIN,MATTHEW D in 2 weeks.  Contact information:  [facility] [address] [place]   PAC  07/15/2016, 7:52 AM

## 2016-07-15 NOTE — Evaluation (Signed)
Physical Therapy Re-Evaluation Patient Details Name: Tom Ortiz MRN: [id number] DOB: Sep 22, 1927 Today's Date: 07/15/2016   History of Present Illness  83 y.o. male admitted following a mechanical fall with resulting Lt hip pain. Fracture found at left greater trochanter to the subtrochanteric region. Atempted conservative management but proceeded wtih surgery bc of pain level. Pt underwent cable fixation of fx site 07/14/2016. PMH: atrial fib, HTN, anxiety, bilateral THA.   Clinical Impression  Pt s/p L hip fx and presents with functional mobility limitations 2* decreased L LE strength/ROM, post op pain and PWB status.  Pt would benefit from follow up rehab at SNF level to maximize IND and safety prior to return home with ltd assist.    Follow Up Recommendations SNF    Equipment Recommendations  None recommended by PT    Recommendations for Other Services OT consult     Precautions / Restrictions Precautions Precautions: Fall Restrictions Weight Bearing Restrictions: Yes LLE Weight Bearing: Partial weight bearing LLE Partial Weight Bearing Percentage or Pounds: 50%      Mobility  Bed Mobility Overal bed mobility: Needs Assistance Bed Mobility: Sit to Supine     Supine to sit: +2 for physical assistance;Mod assist;Max assist Sit to supine: +2 for physical assistance;Max assist   General bed mobility comments: transition slow and guarded and painful; multi-modal cues to self assist  Transfers Overall transfer level: Needs assistance Equipment used: Rolling walker (2 wheeled) Transfers: Sit to/from Stand Sit to Stand: +2 physical assistance;Mod assist;From elevated surface         General transfer comment: Cues for LE management, use of UEs to self assist and safe transition position  Ambulation/Gait Ambulation/Gait assistance: Mod assist;+2 physical assistance Ambulation Distance (Feet): 1 Feet Assistive device: Rolling walker (2 wheeled) Gait  Pattern/deviations: Step-to pattern;Decreased step length - right;Decreased step length - left;Shuffle;Trunk flexed     General Gait Details: Pt able to take 2 small steps back to bed for safe transition position for sitting.  Cues for sequence, UE WB and posture  Stairs            Wheelchair Mobility    Modified Rankin (Stroke Patients Only)       Balance Overall balance assessment: Needs assistance Sitting-balance support: No upper extremity supported;Feet supported Sitting balance-Leahy Scale: Fair     Standing balance support: Bilateral upper extremity supported Standing balance-Leahy Scale: Poor                               Pertinent Vitals/Pain Pain Assessment: Faces Faces Pain Scale: Hurts whole lot Pain Location: Lt hip Pain Descriptors / Indicators: Grimacing;Guarding Pain Intervention(s): Limited activity within patient's tolerance;Monitored during session;Premedicated before session;Ice applied    Home Living Family/patient expects to be discharged to:: Skilled nursing facility                      Prior Function Level of Independence: Independent with assistive device(s)         Comments: reports using cane for ambulation     Hand Dominance        Extremity/Trunk Assessment   Upper Extremity Assessment Upper Extremity Assessment: Overall WFL for tasks assessed    Lower Extremity Assessment Lower Extremity Assessment: LLE deficits/detail LLE Deficits / Details: difficult to assess due to pain        Communication   Communication: HOH  Cognition Arousal/Alertness: Awake/alert Behavior During Therapy: [facility] for  tasks assessed/performed Overall Cognitive Status: Within Functional Limits for tasks assessed                                        General Comments      Exercises General Exercises - Lower Extremity Ankle Circles/Pumps: AROM;Both;15 reps;Supine   Assessment/Plan    PT Assessment  Patient needs continued PT services  PT Problem List Decreased strength;Decreased range of motion;Decreased activity tolerance;Decreased balance;Decreased mobility       PT Treatment Interventions DME instruction;Gait training;Stair training;Functional mobility training;Therapeutic activities;Balance training;Therapeutic exercise;Patient/family education    PT Goals (Current goals can be found in the Care Plan section)  Acute Rehab PT Goals Patient Stated Goal: have less pain PT Goal Formulation: With patient Time For Goal Achievement: 07/26/16 Potential to Achieve Goals: Fair    Frequency Min 3X/week   Barriers to discharge        Co-evaluation               AM-PAC PT "6 Clicks" Daily Activity  Outcome Measure Difficulty turning over in bed (including adjusting bedclothes, sheets and blankets)?: A Lot Difficulty moving from lying on back to sitting on the side of the bed? : A Lot Difficulty sitting down on and standing up from a chair with arms (e.g., wheelchair, bedside commode, etc,.)?: A Lot Help needed moving to and from a bed to chair (including a wheelchair)?: Total Help needed walking in hospital room?: A Lot Help needed climbing 3-5 steps with a railing? : Total 6 Click Score: 10    End of Session Equipment Utilized During Treatment: Gait belt Activity Tolerance: Patient limited by pain;Patient limited by fatigue Patient left: with call bell/phone within reach;in bed;with bed alarm set Nurse Communication: Mobility status PT Visit Diagnosis: Unsteadiness on feet (R26.81);Muscle weakness (generalized) (M62.81);History of falling (Z91.81);Difficulty in walking, not elsewhere classified (R26.2);Pain Pain - Right/Left: Left Pain - part of body: Hip    Time: 6468-0321 PT Time Calculation (min) (ACUTE ONLY): 31 min   Charges:   PT Evaluation $PT Re-evaluation: 1 Procedure PT Treatments $Therapeutic Activity: 8-22 mins   PT G Codes:        Pg [phone]  0037   BRADSHAW,HUNTER 07/15/2016, 10:55 AM

## 2016-07-15 NOTE — Progress Notes (Signed)
CSW assisting with d/c planning. Pt has ST Rehab bed at [facility] for tomorrow if stable for d/c. CSW will continue to follow to assist with d/c planning needs.  Werner Lean LCSW [phone]

## 2016-07-15 NOTE — Progress Notes (Signed)
OT Cancellation Note  Patient Details Name: MALIKYE REPPOND MRN: [id number] DOB: [birth year]   Cancelled Treatment:    Reason Eval/Treat Not Completed: Other (comment).  Pt is moving slowly with PT.  Plan is for SNF placement; will defer OT to that venue.  SPENCER,MARYELLEN 07/15/2016, 10:08 AM  Lesle Chris, OTR/L [phone] 07/15/2016

## 2016-07-15 NOTE — Care Management Important Message (Signed)
Important Message  Patient Details  Name: Tom Ortiz MRN: [id number] Date of Birth: Oct 24, 1927   Medicare Important Message Given:  Yes    Kerin Salen 07/15/2016, 10:41 AMImportant Message  Patient Details  Name: Tom Ortiz MRN: [id number] Date of Birth: [birth year]   Medicare Important Message Given:  Yes    Kerin Salen 07/15/2016, 10:40 AM

## 2016-07-15 NOTE — Progress Notes (Signed)
PROGRESS NOTE    Tom Ortiz  PNT:[id number] DOB: 07/21/1927 DOA: 07/11/2016 PCP: Thressa Sheller, MD   Chief Complaint  Patient presents with  . Fall    Brief Narrative:  HPI on 07/11/2016 by Dr. Leisa Ortiz Tom Ortiz is a 83 y.o. male with medical history significant for left hip instability and history of left total hip arthroplasty back in 2013, history of atrial fibrillation by family at the bedside was not sure whether patient was on a blood thinner or not. Based on Epic review looks like he used to be on xarelto but as mentioned family at the bedside does not remember that. I also asked about history of multiple myeloma and he has seen Dr. Marin Ortiz back in 2016, the issue at that time was that he has been having lot of hip problems and multiple hip surgeries. At that time the plan was for bone scan, vitamin D supplementation and bisphosphonates. I don't see any follow-up visits after that time and family cannot recall if they ever followed up for multiple myeloma. Patient presented to [facility] because he fell earlier today prior to the admission. He he reports that as he was walking the left leg just gave out on him and he fell pretty hard onto the floor on the left side. He is pain is 10 out of 10 in intensity from the left side of the hip radiating down to the leg. Pain is better with analgesia given in ED. He still has pain even at rest. No loss of consciousness at the time of the fall. No other complaints such as chest pain, palpitations or shortness of breath. No fevers or chills. No abdominal pain, nausea or vomiting. Assessment & Plan   Left trochanteric fracture -Orthopedic surgery consultation appreciated -s/p ORIF left hip -PT consulted and recommended SNF -Social work consulted -Follow up with Dr. Alvan Dame in 2 weeks. Dressing to remain in place of 2 weeks.   Essential Hypertension -Continue metoprolol  Paroxysmal Atrial Fibrillation -CHADSVASC 3 -Patient was  started on Xarelto back in 2016 however never started the medication as her concerns regarding bleeding with anticoagulation. Patient did not follow-up with cardiology. -Currently in sinus rhythm -Continue aspirin -Patient will need to see cardiology as an outpatient  Urinary retention  -Possibly secondary to opiates -patient did have foley catheter in place, but removed for voiding trial  Acute kidney injury/Dehydration -Continue IVF -Baseline creatinine 0.8-1, was up to 1.23 -Currently 0.9  Chronic normocytic anemia -Hemoglobin currently 9.7, baseline approximately 10 -Continue iron supplementation  -Continue to monitor CBC  MGUS  -Has seen Dr. Marin Ortiz -CBC stable  DVT Prophylaxis  SCDs  Code Status: Full  Family Communication: None at bedside  Disposition Plan: Admitted. Will need SNF at discharge.   Consultants Orthopedic surgery  Procedures  ORIF left hip  Antibiotics   Anti-infectives    Start     Dose/Rate Route Frequency Ordered Stop   07/14/16 2000  ceFAZolin (ANCEF) IVPB 2g/100 mL premix     2 g 200 mL/hr over 30 Minutes Intravenous Every 6 hours 07/14/16 1652 07/15/16 0213   07/14/16 2000  ceFAZolin (ANCEF) IVPB 1 g/50 mL premix  Status:  Discontinued     1 g 100 mL/hr over 30 Minutes Intravenous Every 6 hours 07/14/16 1652 07/14/16 1653   07/14/16 1319  ceFAZolin (ANCEF) 2-4 GM/100ML-% IVPB    Comments:  [place], Howard: cabinet override      07/14/16 1319 07/14/16 1346   07/14/16 0600  ceFAZolin (ANCEF) IVPB 2g/100 mL premix  Status:  Discontinued     2 g 200 mL/hr over 30 Minutes Intravenous On call to O.R. 07/13/16 2310 07/14/16 1416      Subjective:   Tom Ortiz seen and examined today.  Patient continues to have some pain with movement. Denies chest pian, shortness of breath, abdominal pain.   Objective:   Vitals:   07/14/16 2315 07/15/16 0150 07/15/16 0635 07/15/16 1046  BP: (!) 120/56 (!) 120/57 130/74 136/65  Pulse: 72 79 75  77  Resp:  14 14 15   Temp:  100.2 F (37.9 C) 98.6 F (37 C) 98.3 F (36.8 C)  TempSrc:  Oral Oral Oral  SpO2:  100% 96% 97%  Weight:      Height:        Intake/Output Summary (Last 24 hours) at 07/15/16 1359 Last data filed at 07/15/16 1129  Gross per 24 hour  Intake          2746.67 ml  Output             2400 ml  Net           346.67 ml   Filed Weights   07/11/16 1121 07/11/16 1516  Weight: 72.6 kg (160 lb) 74.8 kg (165 lb)    Exam  General: Well developed, well nourished, NAD, appears stated age  HEENT: NCAT, PERRLA, EOMI, Anicteic Sclera, mucous membranes moist.   Cardiovascular: S1 S2 auscultated, no rubs, murmurs or gallops. Regular rate and rhythm.  Respiratory: Clear to auscultation bilaterally with equal chest rise  Abdomen: Soft, nontender, nondistended, + bowel sounds  Extremities: warm dry without cyanosis clubbing or edema. LLE in immobilizer   Neuro: AAOx3, nonfocal  Psych: Normal affect and demeanor    Data Reviewed: I have personally reviewed following labs and imaging studies  CBC:  Recent Labs Lab 07/11/16 1206 07/15/16 0433  WBC 7.4 6.2  HGB 12.2* 9.7*  HCT 36.1* 28.8*  MCV 97.0 97.6  PLT 185 244   Basic Metabolic Panel:  Recent Labs Lab 07/11/16 1206 07/13/16 0433 07/14/16 0506 07/15/16 0433  NA 137 138 137 135  K 4.5 4.8 4.5 4.2  CL 104 104 104 104  CO2 27 29 28 25   GLUCOSE 113* 97 98 97  BUN 20 32* 27* 19  CREATININE 1.05 1.23 0.98 0.90  CALCIUM 8.6* 8.4* 8.4* 7.9*  MG  --  2.0  --   --    GFR: Estimated Creatinine Clearance: 60.8 mL/min (by C-G formula based on SCr of 0.9 mg/dL). Liver Function Tests: No results for input(s): AST, ALT, ALKPHOS, BILITOT, PROT, ALBUMIN in the last 168 hours. No results for input(s): LIPASE, AMYLASE in the last 168 hours. No results for input(s): AMMONIA in the last 168 hours. Coagulation Profile:  Recent Labs Lab 07/11/16 1206  INR 1.20   Cardiac Enzymes: No results for  input(s): CKTOTAL, CKMB, CKMBINDEX, TROPONINI in the last 168 hours. BNP (last 3 results) No results for input(s): PROBNP in the last 8760 hours. HbA1C: No results for input(s): HGBA1C in the last 72 hours. CBG: No results for input(s): GLUCAP in the last 168 hours. Lipid Profile: No results for input(s): CHOL, HDL, LDLCALC, TRIG, CHOLHDL, LDLDIRECT in the last 72 hours. Thyroid Function Tests: No results for input(s): TSH, T4TOTAL, FREET4, T3FREE, THYROIDAB in the last 72 hours. Anemia Panel: No results for input(s): VITAMINB12, FOLATE, FERRITIN, TIBC, IRON, RETICCTPCT in the last 72 hours. Urine analysis:  Component Value Date/Time   COLORURINE YELLOW 01/22/2012 1216   APPEARANCEUR CLEAR 01/22/2012 1216   LABSPEC 1.025 01/22/2012 1216   PHURINE 5.5 01/22/2012 1216   GLUCOSEU NEGATIVE 01/22/2012 1216   HGBUR NEGATIVE 01/22/2012 1216   BILIRUBINUR NEGATIVE 01/22/2012 1216   KETONESUR NEGATIVE 01/22/2012 1216   PROTEINUR NEGATIVE 01/22/2012 1216   UROBILINOGEN 0.2 01/22/2012 1216   NITRITE NEGATIVE 01/22/2012 1216   LEUKOCYTESUR NEGATIVE 01/22/2012 1216   Sepsis Labs: @LABRCNTIP (procalcitonin:4,lacticidven:4)  ) Recent Results (from the past 240 hour(s))  Surgical pcr screen     Status: None   Collection Time: 07/11/16  3:39 PM  Result Value Ref Range Status   MRSA, PCR NEGATIVE NEGATIVE Final   Staphylococcus aureus NEGATIVE NEGATIVE Final    Comment:        The Xpert SA Assay (FDA approved for NASAL specimens in patients over 44 years of age), is one component of a comprehensive surveillance program.  Test performance has been validated by [facility] for patients greater than or equal to 82 year old. It is not intended to diagnose infection nor to guide or monitor treatment.   MRSA PCR Screening     Status: None   Collection Time: 07/14/16  5:12 AM  Result Value Ref Range Status   MRSA by PCR NEGATIVE NEGATIVE Final    Comment:        The GeneXpert MRSA  Assay (FDA approved for NASAL specimens only), is one component of a comprehensive MRSA colonization surveillance program. It is not intended to diagnose MRSA infection nor to guide or monitor treatment for MRSA infections.       Radiology Studies: Dg Pelvis Portable  Result Date: 07/14/2016 CLINICAL DATA:  Left periprosthetic hip fracture EXAM: PORTABLE PELVIS 1-2 VIEWS COMPARISON:  07/11/2016, 07/14/2016 FINDINGS: Bilateral hip replacements are seen. Fixation wires are now noted along the proximal left femur with reduction of the previously periprosthetic fracture. IMPRESSION: Status post ORIF of proximal left femoral fracture. Electronically Signed   By: Inez Catalina M.D.   On: 07/14/2016 16:17   Dg Femur Min 2 Views Left  Result Date: 07/14/2016 CLINICAL DATA:  ORIF of left hip. EXAM: LEFT FEMUR 2 VIEWS COMPARISON:  07/11/2016. FINDINGS: Again noted is a left total hip arthroplasty device. The previously noted fracture deformity originating from the greater trochanter extending into the proximal shaft has been reduced with 2 cerclage wires. The fracture fragments and hardware components appear to be in anatomic alignment. IMPRESSION: 1. Status post reduction of proximal left periprosthetic femur fracture with cerclage wires. Electronically Signed   By: Kerby Moors M.D.   On: 07/14/2016 15:08     Scheduled Meds: . aspirin  81 mg Oral BID  . docusate sodium  100 mg Oral BID  . feeding supplement (ENSURE ENLIVE)  237 mL Oral BID BM  . ferrous sulfate  325 mg Oral BID WC  . metoprolol succinate  25 mg Oral BID  . multivitamin with minerals  1 tablet Oral Daily  . senna  1 tablet Oral Daily   Continuous Infusions: . sodium chloride 50 mL/hr at 07/14/16 1713  . lactated ringers Stopped (07/14/16 1713)  . methocarbamol (ROBAXIN)  IV Stopped (07/14/16 1603)     LOS: 4 days   Time Spent in minutes   30 minutes  MIKHAIL, MARYANN D.O. on 07/15/2016 at 1:59 PM  Between 7am to 7pm  - Pager - [phone]  After 7pm go to [email or web address] - password [email or web address]  And look for  the night coverage person covering for me after hours  [facility] Office  [phone]

## 2016-07-16 DIAGNOSIS — Z9181 History of falling: Secondary | ICD-10-CM | POA: Diagnosis not present

## 2016-07-16 DIAGNOSIS — N179 Acute kidney failure, unspecified: Secondary | ICD-10-CM

## 2016-07-16 DIAGNOSIS — I480 Paroxysmal atrial fibrillation: Secondary | ICD-10-CM | POA: Diagnosis not present

## 2016-07-16 DIAGNOSIS — R278 Other lack of coordination: Secondary | ICD-10-CM | POA: Diagnosis not present

## 2016-07-16 DIAGNOSIS — W010XXA Fall on same level from slipping, tripping and stumbling without subsequent striking against object, initial encounter: Secondary | ICD-10-CM | POA: Diagnosis not present

## 2016-07-16 DIAGNOSIS — M9702XD Periprosthetic fracture around internal prosthetic left hip joint, subsequent encounter: Secondary | ICD-10-CM | POA: Diagnosis not present

## 2016-07-16 DIAGNOSIS — K5900 Constipation, unspecified: Secondary | ICD-10-CM | POA: Diagnosis not present

## 2016-07-16 DIAGNOSIS — Z4789 Encounter for other orthopedic aftercare: Secondary | ICD-10-CM | POA: Diagnosis not present

## 2016-07-16 DIAGNOSIS — D649 Anemia, unspecified: Secondary | ICD-10-CM | POA: Diagnosis not present

## 2016-07-16 DIAGNOSIS — S79911A Unspecified injury of right hip, initial encounter: Secondary | ICD-10-CM | POA: Diagnosis not present

## 2016-07-16 DIAGNOSIS — L97929 Non-pressure chronic ulcer of unspecified part of left lower leg with unspecified severity: Secondary | ICD-10-CM | POA: Diagnosis not present

## 2016-07-16 DIAGNOSIS — M6281 Muscle weakness (generalized): Secondary | ICD-10-CM | POA: Diagnosis not present

## 2016-07-16 DIAGNOSIS — R2681 Unsteadiness on feet: Secondary | ICD-10-CM | POA: Diagnosis not present

## 2016-07-16 DIAGNOSIS — D472 Monoclonal gammopathy: Secondary | ICD-10-CM

## 2016-07-16 DIAGNOSIS — F419 Anxiety disorder, unspecified: Secondary | ICD-10-CM | POA: Diagnosis not present

## 2016-07-16 DIAGNOSIS — S72142A Displaced intertrochanteric fracture of left femur, initial encounter for closed fracture: Secondary | ICD-10-CM | POA: Diagnosis not present

## 2016-07-16 DIAGNOSIS — R339 Retention of urine, unspecified: Secondary | ICD-10-CM | POA: Diagnosis not present

## 2016-07-16 DIAGNOSIS — M25552 Pain in left hip: Secondary | ICD-10-CM | POA: Diagnosis not present

## 2016-07-16 DIAGNOSIS — S72009A Fracture of unspecified part of neck of unspecified femur, initial encounter for closed fracture: Secondary | ICD-10-CM | POA: Diagnosis not present

## 2016-07-16 DIAGNOSIS — I10 Essential (primary) hypertension: Secondary | ICD-10-CM | POA: Diagnosis not present

## 2016-07-16 DIAGNOSIS — R52 Pain, unspecified: Secondary | ICD-10-CM | POA: Diagnosis not present

## 2016-07-16 DIAGNOSIS — Z96642 Presence of left artificial hip joint: Secondary | ICD-10-CM | POA: Diagnosis not present

## 2016-07-16 DIAGNOSIS — G4700 Insomnia, unspecified: Secondary | ICD-10-CM | POA: Diagnosis not present

## 2016-07-16 LAB — BASIC METABOLIC PANEL
Anion gap: 4 — ABNORMAL LOW (ref 5–15)
BUN: 19 mg/dL (ref 6–20)
CO2: 28 mmol/L (ref 22–32)
Calcium: 8 mg/dL — ABNORMAL LOW (ref 8.9–10.3)
Chloride: 105 mmol/L (ref 101–111)
Creatinine, Ser: 0.87 mg/dL (ref 0.61–1.24)
GFR calc Af Amer: >60 mL/min (ref >60)
GFR calc non Af Amer: >60 mL/min (ref >60)
Glucose, Bld: 102 mg/dL — ABNORMAL HIGH (ref 65–99)
Potassium: 4.2 mmol/L (ref 3.5–5.1)
Sodium: 137 mmol/L (ref 135–145)

## 2016-07-16 LAB — CBC
HCT: 28 % — ABNORMAL LOW (ref 39.0–52.0)
Hemoglobin: 9.5 g/dL — ABNORMAL LOW (ref 13.0–17.0)
MCH: 33 pg (ref 26.0–34.0)
MCHC: 33.9 g/dL (ref 30.0–36.0)
MCV: 97.2 fL (ref 78.0–100.0)
Platelets: 203 10*3/uL (ref 150–400)
RBC: 2.88 MIL/uL — ABNORMAL LOW (ref 4.22–5.81)
RDW: 14.1 % (ref 11.5–15.5)
WBC: 6.4 10*3/uL (ref 4.0–10.5)

## 2016-07-16 MED ORDER — ADULT MULTIVITAMIN W/MINERALS CH: 1.0000 | ORAL_TABLET | Freq: Every day | ORAL | Status: DC

## 2016-07-16 MED ORDER — FERROUS SULFATE 325 (65 FE) MG PO TABS: 325.0000 mg | ORAL_TABLET | Freq: Two times a day (BID) | ORAL | 0 refills | Status: DC

## 2016-07-16 MED ORDER — ENSURE ENLIVE PO LIQD: 237.0000 mL | Freq: Two times a day (BID) | ORAL | 12 refills | Status: DC

## 2016-07-16 NOTE — Progress Notes (Signed)
     Subjective: 2 Days Post-Op Procedure(s) (LRB): OPEN REDUCTION INTERNAL FIXATION (ORIF) PERIPROSTHETIC FRACTURE LEFT HIP (Left)   Patient reports pain as moderate, with movement.  States that he doesn't have pain all the time, just at certain times and usually with people messing with the leg.  States that he is a bit discouraged today do to the pain and not already being pain free.   I have tried to explain to him that the pain should go away eventually.  The cables are more there to keep the bones in place, but that the bones still need to heal.  Objective:   VITALS:   Vitals:   07/15/16 2118 07/16/16 0530  BP: (!) 152/78 (!) 149/79  Pulse: 92 74  Resp: 16 15  Temp: 98.9 F (37.2 C) 98.6 F (37 C)    Dorsiflexion/Plantar flexion intact Incision: dressing C/D/I No cellulitis present Compartment soft  LABS  Recent Labs  07/15/16 0433 07/16/16 0458  HGB 9.7* 9.5*  HCT 28.8* 28.0*  WBC 6.2 6.4  PLT 218 203     Recent Labs  07/14/16 0506 07/15/16 0433 07/16/16 0458  NA 137 135 137  K 4.5 4.2 4.2  BUN 27* 19 19  CREATININE 0.98 0.90 0.87  GLUCOSE 98 97 102*     Assessment/Plan: 2 Days Post-Op Procedure(s) (LRB): OPEN REDUCTION INTERNAL FIXATION (ORIF) PERIPROSTHETIC FRACTURE LEFT HIP (Left) PWB 50% left LE Up with therapy Discharge disposition to be determined  Ortho recommendations:  ASA 325 mg bid for 4 weeks for anticoagulation, unless otherwise medically indicated.  Norco for pain management (Rx written).  Robaxin for muscle spasms (Rx written).  MiraLax and Colace for constipation  Iron 325 mg tid for 2-3 weeks   PWB 50% on the left leg.  Dressing to remain in place until follow in clinic in 2 weeks.  Dressing is waterproof and may shower with it in place.  Follow up in 2 weeks at [facility]. Follow up with OLIN,MATTHEW D in 2 weeks.  Contact information:  [facility] [address], [address]  [place]   PAC  07/16/2016, 8:35 AM

## 2016-07-16 NOTE — Discharge Instructions (Signed)
Hip Fracture A hip fracture is a fracture of the upper part of your thigh bone (femur). What are the causes? A hip fracture is caused by a direct blow to the side of your hip. This is usually the result of a fall but can occur in other circumstances, such as an automobile accident. What increases the risk? There is an increased risk of hip fractures in people with:  An unsteady walking pattern (gait) and those with conditions that contribute to poor balance, such as Parkinson's disease or dementia.  Osteopenia and osteoporosis.  Cancer that spreads to the leg bones.  Certain metabolic diseases.  What are the signs or symptoms? Symptoms of hip fracture include:  Pain over the injured hip.  Inability to put weight on the leg in which the fracture occurred (although, some patients are able to walk after a hip fracture).  Toes and foot of the affected leg point outward when you lie down.  How is this diagnosed? A physical exam can determine if a hip fracture is likely to have occurred. X-ray exams are needed to confirm the fracture and to look for other injuries. The X-ray exam can help to determine the type of hip fracture. Rarely, the fracture is not visible on an X-ray image and a CT scan or MRI will have to be done. How is this treated? The treatment for a fracture is usually surgery. This means using a screw, nail, or rod to hold the bones in place. Follow these instructions at home: Take all medicines as directed by your health care provider. Contact a health care provider if: Pain continues, even after taking pain medicine. This information is not intended to replace advice given to you by your health care provider. Make sure you discuss any questions you have with your health care provider. Document Released: 03/02/2005 Document Revised: 08/08/2015 Document Reviewed: 10/12/2012 Elsevier Interactive Patient Education  2017 [employer]  

## 2016-07-16 NOTE — Clinical Social Work Placement (Signed)
   CLINICAL SOCIAL WORK PLACEMENT  NOTE  Date:  07/16/2016  Patient Details  Name: Tom Ortiz MRN: [id number] Date of Birth: Apr 04, 1927  Clinical Social Work is seeking post-discharge placement for this patient at the [place] level of care (*CSW will initial, date and re-position this form in  chart as items are completed):  Yes   Patient/family provided with [facility] Work Department's list of facilities offering this level of care within the geographic area requested by the patient (or if unable, by the patient's family).  Yes   Patient/family informed of their freedom to choose among providers that offer the needed level of care, that participate in Medicare, Medicaid or managed care program needed by the patient, have an available bed and are willing to accept the patient.  Yes   Patient/family informed of [facility]'s ownership interest in [facility] and [facility], as well as of the fact that they are under no obligation to receive care at these facilities.  PASRR submitted to EDS on       PASRR number received on       Existing PASRR number confirmed on 07/12/16     FL2 transmitted to all facilities in geographic area requested by pt/family on 07/12/16     FL2 transmitted to all facilities within larger geographic area on       Patient informed that his/her managed care company has contracts with or will negotiate with certain facilities, including the following:        Yes   Patient/family informed of bed offers received.  Patient chooses bed at [facility]     Physician recommends and patient chooses bed at      Patient to be transferred to [facility] on 07/16/16.  Patient to be transferred to facility by [facility]     Patient family notified on 07/16/16 of transfer.  Name of family member notified:  SPOUSE     PHYSICIAN       Additional Comment: Pt / spouse are in agreement with d/c to [facility]  today. [facility] transport required. Medical necessity form completed. Spouse is aware out of pocket costs may be associated with [facility] transport. D/C Summary sent to SNF for review. Scripts included in d/c packet. # for report provided to nsg.   _______________________________________________ Luretha Rued, [place] 07/16/2016, 1:52 PM

## 2016-07-16 NOTE — Discharge Summary (Signed)
Physician Discharge Summary  Tom Ortiz HQI:[id number] DOB: [birth year] DOA: 07/11/2016  PCP: Thressa Sheller, MD  Admit date: 07/11/2016 Discharge date: 07/16/2016  Time spent: 45 minutes  Recommendations for Outpatient Follow-up:  Patient will be discharged to skilled nursing facility. Continue physical and occupational therapy.  Patient will need to follow up with primary care provider within one week of discharge. Follow up with Dr. Alvan Dame in 2 weeks.  Patient should continue medications as prescribed.  Patient should follow a heart healthy diet.   Discharge Diagnoses:  Left trochanteric fracture Essential Hypertension Paroxysmal Atrial Fibrillation Urinary retention  Acute kidney injury/Dehydration Chronic normocytic anemia MGUS   Discharge Condition: Stable  Diet recommendation: heart healthy  Filed Weights   07/11/16 1121 07/11/16 1516  Weight: 72.6 kg (160 lb) 74.8 kg (165 lb)    History of present illness:  on 07/11/2016 by Dr. Mauro Kaufmann Riedellis a 83 y.o.malewith medical history significant for left hip instability and history of left total hip arthroplasty back in 2013, history of atrial fibrillation by family at the bedside was not sure whether patient was on a blood thinner or not. Based on Epic review looks like he used to be on xarelto but as mentioned family at the bedside does not remember that. I also asked about history of multiple myeloma and he has seen Dr. Marin Olp back in 2016, the issue at that time was that he has been having lot of hip problems and multiple hip surgeries. At that time the plan was for bone scan, vitamin D supplementation and bisphosphonates. I don't see any follow-up visits after that time and family cannot recall if they ever followed up for multiple myeloma. Patient presented to [facility] because he fell earlier today prior to the admission. He he reports that as he was walking the left leg just gave out on him and he fell  pretty hard onto the floor on the left side. He is pain is 10 out of 10 in intensity from the left side of the hip radiating down to the leg. Pain is better with analgesia given in ED. He still has pain even at rest. No loss of consciousness at the time of the fall. No other complaints such as chest pain, palpitations or shortness of breath. No fevers or chills. No abdominal pain, nausea or vomiting.  Hospital Course:  Left trochanteric fracture -Orthopedic surgery consultation appreciated -s/p ORIF left hip -PT/OT consulted and recommended SNF -Social work consulted -Follow up with Dr. Alvan Dame in 2 weeks. Dressing to remain in place of 2 weeks.   Essential Hypertension -Continue metoprolol  Paroxysmal Atrial Fibrillation -CHADSVASC 3 -Patient was started on Xarelto back in 2016 however never started the medication as her concerns regarding bleeding with anticoagulation. Patient did not follow-up with cardiology. -Currently in sinus rhythm -Continue aspirin -Patient will need to see cardiology as an outpatient  Urinary retention  -Possibly secondary to opiates -patient did have foley catheter in place, but removed for voiding trial  Acute kidney injury/Dehydration -Continue IVF -Baseline creatinine 0.8-1, was up to 1.23 -Currently 0.87  Chronic normocytic anemia -Hemoglobin currently 9.5, baseline approximately 10 -Continue iron supplementation  -Continue to monitor CBC  MGUS  -Has seen Dr. Marin Olp -CBC stable  Consultants Orthopedic surgery  Procedures  ORIF left hip  Discharge Exam: Vitals:   07/15/16 2118 07/16/16 0530  BP: (!) 152/78 (!) 149/79  Pulse: 92 74  Resp: 16 15  Temp: 98.9 F (37.2 C) 98.6 F (37 C)  Patient states he feels down today as he cannot move as he wants. Denies chest pain, shortness of breath, abdominal pain, N/V/D/C. Wishes he could go home to his wife and dog.    General: Well developed, well nourished, NAD, appears stated  age  HEENT: NCAT, mucous membranes moist.  Cardiovascular: S1 S2 auscultated, no rubs, murmurs or gallops. Regular rate and rhythm.  Respiratory: Clear to auscultation bilaterally with equal chest rise  Abdomen: Soft, nontender, nondistended, + bowel sounds  Extremities: warm dry without cyanosis clubbing or edema  Neuro: AAOx3, nonfocal  Psych: Depressed  Discharge Instructions Discharge Instructions    Discharge instructions    Complete by:  As directed    Patient will be discharged to skilled nursing facility. Continue physical and occupational therapy.  Patient will need to follow up with primary care provider within one week of discharge. Follow up with Dr. Alvan Dame in 2 weeks.  Patient should continue medications as prescribed.  Patient should follow a heart healthy diet.   Partial weight bearing    Complete by:  As directed    % Body Weight:  50   Laterality:  left   Extremity:  Lower     Current Discharge Medication List    START taking these medications   Details  docusate sodium (COLACE) 100 MG capsule Take 1 capsule (100 mg total) by mouth 2 (two) times daily. Qty: 60 capsule, Refills: 0    feeding supplement, ENSURE ENLIVE, (ENSURE ENLIVE) LIQD Take 237 mLs by mouth 2 (two) times daily between meals. Qty: 237 mL, Refills: 12    ferrous sulfate 325 (65 FE) MG tablet Take 1 tablet (325 mg total) by mouth 2 (two) times daily with a meal. Qty: 60 tablet, Refills: 0    HYDROcodone-acetaminophen (NORCO/VICODIN) 5-325 MG tablet Take 1-2 tablets by mouth every 6 (six) hours as needed for moderate pain. Qty: 40 tablet, Refills: 0    methocarbamol (ROBAXIN) 500 MG tablet Take 1 tablet (500 mg total) by mouth 4 (four) times daily. Qty: 20 tablet, Refills: 0    Multiple Vitamin (MULTIVITAMIN WITH MINERALS) TABS tablet Take 1 tablet by mouth daily.    senna (SENOKOT) 8.6 MG TABS tablet Take 1 tablet (8.6 mg total) by mouth daily. Qty: 30 each, Refills: 0      CONTINUE  these medications which have CHANGED   Details  aspirin 325 MG tablet Take 1 tablet (325 mg total) by mouth 2 (two) times daily. Take for 4 weeks. Then resume regular dose. Qty: 60 tablet, Refills: 0      CONTINUE these medications which have NOT CHANGED   Details  dicyclomine (BENTYL) 10 MG capsule Take 10 mg by mouth daily as needed (for stomach upset).     LORazepam (ATIVAN) 0.5 MG tablet Take 0.5 mg by mouth at bedtime as needed for sleep.     metoprolol succinate (TOPROL-XL) 25 MG 24 hr tablet Take 1 tablet (25 mg total) by mouth daily. NEED OV. Qty: 30 tablet, Refills: 0      STOP taking these medications     naproxen sodium (ANAPROX) 220 MG tablet        Allergies  Allergen Reactions  . Quinolones Hives and Rash    83 yr old allergy to quinine    Contact information for follow-up providers    MACKENZIE,BRIAN, MD. Schedule an appointment as soon as possible for a visit in 1 week(s).   Specialty:  Internal Medicine Why:  Hospital follow up Contact  information: [address] [place] [address] [phone]            Contact information for after-discharge care    Destination    [facility] Follow [facility].   Specialty:  [place] information: [address] [place] [phone]                   The results of significant diagnostics from this hospitalization (including imaging, microbiology, ancillary and laboratory) are listed below for reference.    Significant Diagnostic Studies: Dg Chest 1 View  Result Date: 07/11/2016 CLINICAL DATA:  Recent falling.  Left hip pain after today's fall. EXAM: CHEST 1 VIEW COMPARISON:  12/08/2011 FINDINGS: Lungs are clear without airspace disease or pulmonary edema. Slightly low lung volumes with central vascular crowding. Atherosclerotic calcifications at the aortic arch. There are chronic round calcifications around the proximal  left humerus. Severe joint space narrowing and degenerative changes at the left shoulder joint. Fullness in the right paratracheal region probably related to vascular crowding and slightly low lung volumes. IMPRESSION: Slightly low lung volumes.  No acute chest findings. Electronically Signed   By: Markus Daft M.D.   On: 07/11/2016 12:15   Dg Pelvis Portable  Result Date: 07/14/2016 CLINICAL DATA:  Left periprosthetic hip fracture EXAM: PORTABLE PELVIS 1-2 VIEWS COMPARISON:  07/11/2016, 07/14/2016 FINDINGS: Bilateral hip replacements are seen. Fixation wires are now noted along the proximal left femur with reduction of the previously periprosthetic fracture. IMPRESSION: Status post ORIF of proximal left femoral fracture. Electronically Signed   By: Inez Catalina M.D.   On: 07/14/2016 16:17   Dg Hip Unilat With Pelvis 2-3 Views Left  Result Date: 07/11/2016 CLINICAL DATA:  Status post fall.  Left hip pain. EXAM: DG HIP (WITH OR WITHOUT PELVIS) 2-3V LEFT COMPARISON:  None. FINDINGS: Right total hip arthroplasty without failure complication. Left total hip arthroplasty. Acute oblique fracture extending from the left greater trochanter, inferomedially into the subtrochanteric region. 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 Generalized osteopenia. Mild osteoarthritis of bilateral sacroiliac joints. IMPRESSION: Left total hip arthroplasty. Acute oblique fracture extending from the left greater trochanter, inferomedially into the subtrochanteric region. 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 Electronically Signed   By: Kathreen Devoid   On: 07/11/2016 12:14   Dg Femur Min 2 Views Left  Result Date: 07/14/2016 CLINICAL DATA:  ORIF of left hip. EXAM: LEFT FEMUR 2 VIEWS COMPARISON:  07/11/2016. FINDINGS: Again noted is a left total hip arthroplasty device. The  previously noted fracture deformity originating from the greater trochanter extending into the proximal shaft has been reduced with 2 cerclage wires. The fracture fragments and hardware components appear to be in anatomic alignment. IMPRESSION: 1. Status post reduction of proximal left periprosthetic femur fracture with cerclage wires. Electronically Signed   By: Kerby Moors M.D.   On: 07/14/2016 15:08    Microbiology: Recent Results (from the past 240 hour(s))  Surgical pcr screen     Status: None   Collection Time: 07/11/16  3:39 PM  Result Value Ref Range Status   MRSA, PCR NEGATIVE NEGATIVE Final   Staphylococcus aureus NEGATIVE NEGATIVE Final    Comment:        The Xpert SA Assay (FDA approved for NASAL specimens in patients over 79 years of age), is one component  of a comprehensive surveillance program.  Test performance has been validated by [facility] for patients greater than or equal to 90 year old. It is not intended to diagnose infection nor to guide or monitor treatment.   MRSA PCR Screening     Status: None   Collection Time: 07/14/16  5:12 AM  Result Value Ref Range Status   MRSA by PCR NEGATIVE NEGATIVE Final    Comment:        The GeneXpert MRSA Assay (FDA approved for NASAL specimens only), is one component of a comprehensive MRSA colonization surveillance program. It is not intended to diagnose MRSA infection nor to guide or monitor treatment for MRSA infections.      Labs: Basic Metabolic Panel:  Recent Labs Lab 07/11/16 1206 07/13/16 0433 07/14/16 0506 07/15/16 0433 07/16/16 0458  NA 137 138 137 135 137  K 4.5 4.8 4.5 4.2 4.2  CL 104 104 104 104 105  CO2 _0 GLUCOSE 113* 97 98 97 102*  BUN 20 32* 27* 19 19  CREATININE 1.05 1.23 0.98 0.90 0.87  CALCIUM 8.6* 8.4* 8.4* 7.9* 8.0*  MG  --  2.0  --   --   --    Liver Function Tests: No results for input(s): AST, ALT, ALKPHOS, BILITOT, PROT, ALBUMIN in the last 168 hours. No  results for input(s): LIPASE, AMYLASE in the last 168 hours. No results for input(s): AMMONIA in the last 168 hours. CBC:  Recent Labs Lab 07/11/16 1206 07/15/16 0433 07/16/16 0458  WBC 7.4 6.2 6.4  HGB 12.2* 9.7* 9.5*  HCT 36.1* 28.8* 28.0*  MCV 97.0 97.6 97.2  PLT 185 218 203   Cardiac Enzymes: No results for input(s): CKTOTAL, CKMB, CKMBINDEX, TROPONINI in the last 168 hours. BNP: BNP (last 3 results) No results for input(s): BNP in the last 8760 hours.  ProBNP (last 3 results) No results for input(s): PROBNP in the last 8760 hours.  CBG: No results for input(s): GLUCAP in the last 168 hours.     SignedCristal Ford  [facility] 07/16/2016, 10:21 AM

## 2016-07-17 NOTE — Op Note (Signed)
NAMEFINLEY, CHEVEZ NO.:  [id number]  MEDICAL RECORD NO.:  [id number]  LOCATION:  WOTF                         FACILITY:  [facility]  PHYSICIAN:  Pietro Cassis. Alvan Dame, M.D.  DATE OF BIRTH:  1927-06-17  DATE OF PROCEDURE:  07/14/2016 DATE OF DISCHARGE:                              OPERATIVE REPORT   PREOPERATIVE DIAGNOSIS:  Left periprosthetic proximal femur fracture with a stable prosthesis classified as a [place] B pattern.  POSTOPERATIVE DIAGNOSIS:  Left periprosthetic proximal femur fracture with a stable prosthesis classified as a [place] B pattern.  PROCEDURES PERFORMED:  Open reduction internal fixation of left periprosthetic proximal femur fracture.  COMPONENTS USED:  Three Biomet/Zimmer cables were utilized to reduce and maintain fracture reduction.  SURGEON:  Pietro Cassis. Alvan Dame, M.D.  ASSISTANT:  Ardeen Jourdain, PA-C.  Please note that Ms. Cecilio Asper was present for the entirety of the case for preoperative positioning, perioperative management of the operative extremity, general facilitation of the case, and primary wound closure.  ANESTHESIA:  General.  SPECIMENS:  None.  COMPLICATION:  None.  BLOOD LOSS:  250 mL.  INDICATIONS FOR PROCEDURE:  Mr. Brissett is an 83 year old male with longstanding history of bilateral hip arthroplasties.  His left hip over the years has been complicated by instability requiring the use of a constrained liner.  He was in his normal state of health until July 11, 2016, when he fell at home landing on his left hip.  He had immediate onset of pain, had a difficult time weightbearing and brought to the emergency room for evaluation.  Initial radiographs indicated a proximal femur periprosthetic fracture. He was admitted to the Medical Service upon my consultation and review with him.  We were observing his ability to be mobilized independently. Over the few days that he was in the hospital, we found it  extremely challenging to move related to the pain.  I subsequently reintroduced the discussion of open reduction internal fixation.  His femoral prosthesis was stable.  Thus, the fracture pattern itself would theoretically heal based on clinical data.  However, due to the amount of pain he was having, we discussed surgically repairing this to provide further stability of the fracture pattern as to eliminate his pain.  He wished to proceed.  Risks of infection, nonunion, malunion, need for future surgery were discussed, reviewed with he and his wife.  Consent was obtained.  PROCEDURE IN DETAIL:  The patient was brought to the operative theater. Once adequate anesthesia, preoperative antibiotics, Ancef administered, he was positioned into the right lateral decubitus position with left side up.  The left lower extremity was then prepped and draped in sterile fashion.  Time-out was performed identifying the patient, planned procedure, and extremity.  He had 2 incisions over the posterior and lateral aspects of his hip.  We utilized one of those.  Soft tissue dissection was carried down to the iliotibial band and gluteal fascia to trochanter.  The iliotibial band was then split and the vastus lateralis was elevated from posterior to anterior.  Retractors were placed.  We identified the orientation of the lesser trochanter.  I first placed based on my preoperative planning 2 cables proximal and distal  to the lesser trochanter.  These were crimped and held in place.  We then brought the portable radiography into the room and shot a pelvis view. Based on this determination, I placed a 3rd cable about 2 cm down from my distal cable to provide further fixation of this pattern.  All cables were crimped down to 75 units of pressure.  Once this was done, the cables were crimped and cut.  The wound was irrigated throughout the case and again at this point.  There was some bleeding during the case  with some of the perforating vessels, but these were all cauterized.  There was no active bleeding at the time of the closure. At this point, the iliotibial band was reapproximated over the vastus lateralis.  The gluteal fascia was closed using #1 Vicryl and Stratafix suture.  The remainder of the wound at this point was closed with 2-0 Vicryl and running 4-0 Monocryl.  The hip was then cleaned, dried, and sterilely using surgical glue and Aquacel dressing.  He was brought to the recovery room in stable condition tolerating the procedure well.  We will allow him to be partial weightbearing in the postoperative period until his fracture heals.  We will see him back in the office in routine followup, 2 weeks for wound check.     Pietro Cassis Alvan Dame, M.D.     MDO/MEDQ  D:  07/17/2016  T:  07/17/2016  Job:  [id number]

## 2016-07-17 NOTE — Brief Op Note (Signed)
07/14/2016  3:15PM  PATIENT:  Tom Ortiz  83 y.o. male  PRE-OPERATIVE DIAGNOSIS:  LEFT HIP PERIPROSTHETIC FRACTURE  POST-OPERATIVE DIAGNOSIS:  LEFT HIP PERIPROSTHETIC FRACTURE  PROCEDURE:  Procedure(s): OPEN REDUCTION INTERNAL FIXATION (ORIF) PERIPROSTHETIC FRACTURE LEFT HIP (Left)  SURGEON:  Surgeon(s) and Role:    * Paralee Cancel, MD - Primary  PHYSICIAN ASSISTANT: Ardeen Jourdain, PA-C  ANESTHESIA:   general  EBL:  250cc  BLOOD ADMINISTERED:none  DRAINS: none   LOCAL MEDICATIONS USED:  NONE  SPECIMEN:  No Specimen  DISPOSITION OF SPECIMEN:  N/A  COUNTS:  YES  TOURNIQUET:  * No tourniquets in log *  DICTATION: .Other Dictation: Dictation Number [id number]  PLAN OF CARE: Admit to inpatient   PATIENT DISPOSITION:  PACU - hemodynamically stable.   Delay start of Pharmacological VTE agent (>24hrs) due to surgical blood loss or risk of bleeding: no

## 2016-07-24 DIAGNOSIS — Z4789 Encounter for other orthopedic aftercare: Secondary | ICD-10-CM | POA: Diagnosis not present

## 2016-07-29 DIAGNOSIS — Z9181 History of falling: Secondary | ICD-10-CM | POA: Diagnosis not present

## 2016-07-29 DIAGNOSIS — M9702XD Periprosthetic fracture around internal prosthetic left hip joint, subsequent encounter: Secondary | ICD-10-CM | POA: Diagnosis not present

## 2016-07-29 DIAGNOSIS — L97929 Non-pressure chronic ulcer of unspecified part of left lower leg with unspecified severity: Secondary | ICD-10-CM | POA: Diagnosis not present

## 2016-07-29 DIAGNOSIS — Z96642 Presence of left artificial hip joint: Secondary | ICD-10-CM | POA: Diagnosis not present

## 2016-08-20 DIAGNOSIS — I480 Paroxysmal atrial fibrillation: Secondary | ICD-10-CM | POA: Diagnosis not present

## 2016-08-20 DIAGNOSIS — M9702XD Periprosthetic fracture around internal prosthetic left hip joint, subsequent encounter: Secondary | ICD-10-CM | POA: Diagnosis not present

## 2016-08-20 DIAGNOSIS — F419 Anxiety disorder, unspecified: Secondary | ICD-10-CM | POA: Diagnosis not present

## 2016-08-20 DIAGNOSIS — S72142D Displaced intertrochanteric fracture of left femur, subsequent encounter for closed fracture with routine healing: Secondary | ICD-10-CM | POA: Diagnosis not present

## 2016-08-20 DIAGNOSIS — I10 Essential (primary) hypertension: Secondary | ICD-10-CM | POA: Diagnosis not present

## 2016-08-20 DIAGNOSIS — W19XXXD Unspecified fall, subsequent encounter: Secondary | ICD-10-CM | POA: Diagnosis not present

## 2016-08-21 DIAGNOSIS — Z4789 Encounter for other orthopedic aftercare: Secondary | ICD-10-CM | POA: Diagnosis not present

## 2016-08-21 DIAGNOSIS — S72142D Displaced intertrochanteric fracture of left femur, subsequent encounter for closed fracture with routine healing: Secondary | ICD-10-CM | POA: Diagnosis not present

## 2016-08-24 DIAGNOSIS — F419 Anxiety disorder, unspecified: Secondary | ICD-10-CM | POA: Diagnosis not present

## 2016-08-24 DIAGNOSIS — W19XXXD Unspecified fall, subsequent encounter: Secondary | ICD-10-CM | POA: Diagnosis not present

## 2016-08-24 DIAGNOSIS — S72142D Displaced intertrochanteric fracture of left femur, subsequent encounter for closed fracture with routine healing: Secondary | ICD-10-CM | POA: Diagnosis not present

## 2016-08-24 DIAGNOSIS — I10 Essential (primary) hypertension: Secondary | ICD-10-CM | POA: Diagnosis not present

## 2016-08-24 DIAGNOSIS — I480 Paroxysmal atrial fibrillation: Secondary | ICD-10-CM | POA: Diagnosis not present

## 2016-08-24 DIAGNOSIS — M9702XD Periprosthetic fracture around internal prosthetic left hip joint, subsequent encounter: Secondary | ICD-10-CM | POA: Diagnosis not present

## 2016-08-28 DIAGNOSIS — I480 Paroxysmal atrial fibrillation: Secondary | ICD-10-CM | POA: Diagnosis not present

## 2016-08-28 DIAGNOSIS — F419 Anxiety disorder, unspecified: Secondary | ICD-10-CM | POA: Diagnosis not present

## 2016-08-28 DIAGNOSIS — M9702XD Periprosthetic fracture around internal prosthetic left hip joint, subsequent encounter: Secondary | ICD-10-CM | POA: Diagnosis not present

## 2016-08-28 DIAGNOSIS — W19XXXD Unspecified fall, subsequent encounter: Secondary | ICD-10-CM | POA: Diagnosis not present

## 2016-08-28 DIAGNOSIS — S72142D Displaced intertrochanteric fracture of left femur, subsequent encounter for closed fracture with routine healing: Secondary | ICD-10-CM | POA: Diagnosis not present

## 2016-08-28 DIAGNOSIS — I10 Essential (primary) hypertension: Secondary | ICD-10-CM | POA: Diagnosis not present

## 2016-08-31 DIAGNOSIS — I10 Essential (primary) hypertension: Secondary | ICD-10-CM | POA: Diagnosis not present

## 2016-08-31 DIAGNOSIS — W19XXXD Unspecified fall, subsequent encounter: Secondary | ICD-10-CM | POA: Diagnosis not present

## 2016-08-31 DIAGNOSIS — M9702XD Periprosthetic fracture around internal prosthetic left hip joint, subsequent encounter: Secondary | ICD-10-CM | POA: Diagnosis not present

## 2016-08-31 DIAGNOSIS — I480 Paroxysmal atrial fibrillation: Secondary | ICD-10-CM | POA: Diagnosis not present

## 2016-08-31 DIAGNOSIS — F419 Anxiety disorder, unspecified: Secondary | ICD-10-CM | POA: Diagnosis not present

## 2016-08-31 DIAGNOSIS — S72142D Displaced intertrochanteric fracture of left femur, subsequent encounter for closed fracture with routine healing: Secondary | ICD-10-CM | POA: Diagnosis not present

## 2016-09-01 DIAGNOSIS — F419 Anxiety disorder, unspecified: Secondary | ICD-10-CM | POA: Diagnosis not present

## 2016-09-01 DIAGNOSIS — W19XXXD Unspecified fall, subsequent encounter: Secondary | ICD-10-CM | POA: Diagnosis not present

## 2016-09-01 DIAGNOSIS — M9702XD Periprosthetic fracture around internal prosthetic left hip joint, subsequent encounter: Secondary | ICD-10-CM | POA: Diagnosis not present

## 2016-09-01 DIAGNOSIS — I10 Essential (primary) hypertension: Secondary | ICD-10-CM | POA: Diagnosis not present

## 2016-09-01 DIAGNOSIS — I480 Paroxysmal atrial fibrillation: Secondary | ICD-10-CM | POA: Diagnosis not present

## 2016-09-01 DIAGNOSIS — S72142D Displaced intertrochanteric fracture of left femur, subsequent encounter for closed fracture with routine healing: Secondary | ICD-10-CM | POA: Diagnosis not present

## 2016-09-03 DIAGNOSIS — I10 Essential (primary) hypertension: Secondary | ICD-10-CM | POA: Diagnosis not present

## 2016-09-03 DIAGNOSIS — W19XXXD Unspecified fall, subsequent encounter: Secondary | ICD-10-CM | POA: Diagnosis not present

## 2016-09-03 DIAGNOSIS — M9702XD Periprosthetic fracture around internal prosthetic left hip joint, subsequent encounter: Secondary | ICD-10-CM | POA: Diagnosis not present

## 2016-09-03 DIAGNOSIS — F419 Anxiety disorder, unspecified: Secondary | ICD-10-CM | POA: Diagnosis not present

## 2016-09-03 DIAGNOSIS — I480 Paroxysmal atrial fibrillation: Secondary | ICD-10-CM | POA: Diagnosis not present

## 2016-09-03 DIAGNOSIS — S72142D Displaced intertrochanteric fracture of left femur, subsequent encounter for closed fracture with routine healing: Secondary | ICD-10-CM | POA: Diagnosis not present

## 2016-09-04 DIAGNOSIS — S72142D Displaced intertrochanteric fracture of left femur, subsequent encounter for closed fracture with routine healing: Secondary | ICD-10-CM | POA: Diagnosis not present

## 2016-09-04 DIAGNOSIS — I10 Essential (primary) hypertension: Secondary | ICD-10-CM | POA: Diagnosis not present

## 2016-09-04 DIAGNOSIS — W19XXXD Unspecified fall, subsequent encounter: Secondary | ICD-10-CM | POA: Diagnosis not present

## 2016-09-04 DIAGNOSIS — M9702XD Periprosthetic fracture around internal prosthetic left hip joint, subsequent encounter: Secondary | ICD-10-CM | POA: Diagnosis not present

## 2016-09-04 DIAGNOSIS — F419 Anxiety disorder, unspecified: Secondary | ICD-10-CM | POA: Diagnosis not present

## 2016-09-04 DIAGNOSIS — I480 Paroxysmal atrial fibrillation: Secondary | ICD-10-CM | POA: Diagnosis not present

## 2016-09-07 DIAGNOSIS — I480 Paroxysmal atrial fibrillation: Secondary | ICD-10-CM | POA: Diagnosis not present

## 2016-09-07 DIAGNOSIS — I10 Essential (primary) hypertension: Secondary | ICD-10-CM | POA: Diagnosis not present

## 2016-09-07 DIAGNOSIS — M9702XD Periprosthetic fracture around internal prosthetic left hip joint, subsequent encounter: Secondary | ICD-10-CM | POA: Diagnosis not present

## 2016-09-07 DIAGNOSIS — S72142D Displaced intertrochanteric fracture of left femur, subsequent encounter for closed fracture with routine healing: Secondary | ICD-10-CM | POA: Diagnosis not present

## 2016-09-07 DIAGNOSIS — F419 Anxiety disorder, unspecified: Secondary | ICD-10-CM | POA: Diagnosis not present

## 2016-09-07 DIAGNOSIS — W19XXXD Unspecified fall, subsequent encounter: Secondary | ICD-10-CM | POA: Diagnosis not present

## 2016-09-09 DIAGNOSIS — F419 Anxiety disorder, unspecified: Secondary | ICD-10-CM | POA: Diagnosis not present

## 2016-09-09 DIAGNOSIS — I10 Essential (primary) hypertension: Secondary | ICD-10-CM | POA: Diagnosis not present

## 2016-09-09 DIAGNOSIS — W19XXXD Unspecified fall, subsequent encounter: Secondary | ICD-10-CM | POA: Diagnosis not present

## 2016-09-09 DIAGNOSIS — M9702XD Periprosthetic fracture around internal prosthetic left hip joint, subsequent encounter: Secondary | ICD-10-CM | POA: Diagnosis not present

## 2016-09-09 DIAGNOSIS — S72142D Displaced intertrochanteric fracture of left femur, subsequent encounter for closed fracture with routine healing: Secondary | ICD-10-CM | POA: Diagnosis not present

## 2016-09-09 DIAGNOSIS — I480 Paroxysmal atrial fibrillation: Secondary | ICD-10-CM | POA: Diagnosis not present

## 2016-09-14 DIAGNOSIS — S72142D Displaced intertrochanteric fracture of left femur, subsequent encounter for closed fracture with routine healing: Secondary | ICD-10-CM | POA: Diagnosis not present

## 2016-09-14 DIAGNOSIS — M9702XD Periprosthetic fracture around internal prosthetic left hip joint, subsequent encounter: Secondary | ICD-10-CM | POA: Diagnosis not present

## 2016-09-14 DIAGNOSIS — I480 Paroxysmal atrial fibrillation: Secondary | ICD-10-CM | POA: Diagnosis not present

## 2016-09-14 DIAGNOSIS — W19XXXD Unspecified fall, subsequent encounter: Secondary | ICD-10-CM | POA: Diagnosis not present

## 2016-09-14 DIAGNOSIS — F419 Anxiety disorder, unspecified: Secondary | ICD-10-CM | POA: Diagnosis not present

## 2016-09-14 DIAGNOSIS — I10 Essential (primary) hypertension: Secondary | ICD-10-CM | POA: Diagnosis not present

## 2016-09-18 DIAGNOSIS — F419 Anxiety disorder, unspecified: Secondary | ICD-10-CM | POA: Diagnosis not present

## 2016-09-18 DIAGNOSIS — I480 Paroxysmal atrial fibrillation: Secondary | ICD-10-CM | POA: Diagnosis not present

## 2016-09-18 DIAGNOSIS — M9702XD Periprosthetic fracture around internal prosthetic left hip joint, subsequent encounter: Secondary | ICD-10-CM | POA: Diagnosis not present

## 2016-09-18 DIAGNOSIS — S72142D Displaced intertrochanteric fracture of left femur, subsequent encounter for closed fracture with routine healing: Secondary | ICD-10-CM | POA: Diagnosis not present

## 2016-09-18 DIAGNOSIS — W19XXXD Unspecified fall, subsequent encounter: Secondary | ICD-10-CM | POA: Diagnosis not present

## 2016-09-18 DIAGNOSIS — I10 Essential (primary) hypertension: Secondary | ICD-10-CM | POA: Diagnosis not present

## 2016-09-22 DIAGNOSIS — S72142D Displaced intertrochanteric fracture of left femur, subsequent encounter for closed fracture with routine healing: Secondary | ICD-10-CM | POA: Diagnosis not present

## 2016-09-22 DIAGNOSIS — I10 Essential (primary) hypertension: Secondary | ICD-10-CM | POA: Diagnosis not present

## 2016-09-22 DIAGNOSIS — I480 Paroxysmal atrial fibrillation: Secondary | ICD-10-CM | POA: Diagnosis not present

## 2016-09-22 DIAGNOSIS — F419 Anxiety disorder, unspecified: Secondary | ICD-10-CM | POA: Diagnosis not present

## 2016-09-22 DIAGNOSIS — M9702XD Periprosthetic fracture around internal prosthetic left hip joint, subsequent encounter: Secondary | ICD-10-CM | POA: Diagnosis not present

## 2016-09-22 DIAGNOSIS — W19XXXD Unspecified fall, subsequent encounter: Secondary | ICD-10-CM | POA: Diagnosis not present

## 2016-09-23 DIAGNOSIS — W19XXXD Unspecified fall, subsequent encounter: Secondary | ICD-10-CM | POA: Diagnosis not present

## 2016-09-23 DIAGNOSIS — F419 Anxiety disorder, unspecified: Secondary | ICD-10-CM | POA: Diagnosis not present

## 2016-09-23 DIAGNOSIS — I10 Essential (primary) hypertension: Secondary | ICD-10-CM | POA: Diagnosis not present

## 2016-09-23 DIAGNOSIS — I480 Paroxysmal atrial fibrillation: Secondary | ICD-10-CM | POA: Diagnosis not present

## 2016-09-23 DIAGNOSIS — S72142D Displaced intertrochanteric fracture of left femur, subsequent encounter for closed fracture with routine healing: Secondary | ICD-10-CM | POA: Diagnosis not present

## 2016-09-23 DIAGNOSIS — M9702XD Periprosthetic fracture around internal prosthetic left hip joint, subsequent encounter: Secondary | ICD-10-CM | POA: Diagnosis not present

## 2016-09-30 DIAGNOSIS — I480 Paroxysmal atrial fibrillation: Secondary | ICD-10-CM | POA: Diagnosis not present

## 2016-09-30 DIAGNOSIS — I10 Essential (primary) hypertension: Secondary | ICD-10-CM | POA: Diagnosis not present

## 2016-09-30 DIAGNOSIS — W19XXXD Unspecified fall, subsequent encounter: Secondary | ICD-10-CM | POA: Diagnosis not present

## 2016-09-30 DIAGNOSIS — S72142D Displaced intertrochanteric fracture of left femur, subsequent encounter for closed fracture with routine healing: Secondary | ICD-10-CM | POA: Diagnosis not present

## 2016-09-30 DIAGNOSIS — F419 Anxiety disorder, unspecified: Secondary | ICD-10-CM | POA: Diagnosis not present

## 2016-09-30 DIAGNOSIS — M9702XD Periprosthetic fracture around internal prosthetic left hip joint, subsequent encounter: Secondary | ICD-10-CM | POA: Diagnosis not present

## 2016-10-02 DIAGNOSIS — I10 Essential (primary) hypertension: Secondary | ICD-10-CM | POA: Diagnosis not present

## 2016-10-02 DIAGNOSIS — M9702XD Periprosthetic fracture around internal prosthetic left hip joint, subsequent encounter: Secondary | ICD-10-CM | POA: Diagnosis not present

## 2016-10-02 DIAGNOSIS — I480 Paroxysmal atrial fibrillation: Secondary | ICD-10-CM | POA: Diagnosis not present

## 2016-10-02 DIAGNOSIS — M978XXD Periprosthetic fracture around other internal prosthetic joint, subsequent encounter: Secondary | ICD-10-CM | POA: Diagnosis not present

## 2016-10-02 DIAGNOSIS — Z4789 Encounter for other orthopedic aftercare: Secondary | ICD-10-CM | POA: Diagnosis not present

## 2016-10-02 DIAGNOSIS — W19XXXD Unspecified fall, subsequent encounter: Secondary | ICD-10-CM | POA: Diagnosis not present

## 2016-10-02 DIAGNOSIS — Z471 Aftercare following joint replacement surgery: Secondary | ICD-10-CM | POA: Diagnosis not present

## 2016-10-02 DIAGNOSIS — S72142D Displaced intertrochanteric fracture of left femur, subsequent encounter for closed fracture with routine healing: Secondary | ICD-10-CM | POA: Diagnosis not present

## 2016-10-02 DIAGNOSIS — Z96642 Presence of left artificial hip joint: Secondary | ICD-10-CM | POA: Diagnosis not present

## 2016-10-02 DIAGNOSIS — F419 Anxiety disorder, unspecified: Secondary | ICD-10-CM | POA: Diagnosis not present

## 2016-10-06 DIAGNOSIS — R5383 Other fatigue: Secondary | ICD-10-CM | POA: Diagnosis not present

## 2016-10-06 DIAGNOSIS — Z471 Aftercare following joint replacement surgery: Secondary | ICD-10-CM | POA: Diagnosis not present

## 2016-10-06 DIAGNOSIS — R197 Diarrhea, unspecified: Secondary | ICD-10-CM | POA: Diagnosis not present

## 2016-10-06 DIAGNOSIS — N390 Urinary tract infection, site not specified: Secondary | ICD-10-CM | POA: Diagnosis not present

## 2016-10-06 DIAGNOSIS — R634 Abnormal weight loss: Secondary | ICD-10-CM | POA: Diagnosis not present

## 2016-10-06 DIAGNOSIS — D539 Nutritional anemia, unspecified: Secondary | ICD-10-CM | POA: Diagnosis not present

## 2016-10-07 DIAGNOSIS — F419 Anxiety disorder, unspecified: Secondary | ICD-10-CM | POA: Diagnosis not present

## 2016-10-07 DIAGNOSIS — S72142D Displaced intertrochanteric fracture of left femur, subsequent encounter for closed fracture with routine healing: Secondary | ICD-10-CM | POA: Diagnosis not present

## 2016-10-07 DIAGNOSIS — R197 Diarrhea, unspecified: Secondary | ICD-10-CM | POA: Diagnosis not present

## 2016-10-07 DIAGNOSIS — W19XXXD Unspecified fall, subsequent encounter: Secondary | ICD-10-CM | POA: Diagnosis not present

## 2016-10-07 DIAGNOSIS — M9702XD Periprosthetic fracture around internal prosthetic left hip joint, subsequent encounter: Secondary | ICD-10-CM | POA: Diagnosis not present

## 2016-10-07 DIAGNOSIS — I10 Essential (primary) hypertension: Secondary | ICD-10-CM | POA: Diagnosis not present

## 2016-10-07 DIAGNOSIS — I480 Paroxysmal atrial fibrillation: Secondary | ICD-10-CM | POA: Diagnosis not present

## 2016-10-09 DIAGNOSIS — F419 Anxiety disorder, unspecified: Secondary | ICD-10-CM | POA: Diagnosis not present

## 2016-10-09 DIAGNOSIS — M9702XD Periprosthetic fracture around internal prosthetic left hip joint, subsequent encounter: Secondary | ICD-10-CM | POA: Diagnosis not present

## 2016-10-09 DIAGNOSIS — I10 Essential (primary) hypertension: Secondary | ICD-10-CM | POA: Diagnosis not present

## 2016-10-09 DIAGNOSIS — W19XXXD Unspecified fall, subsequent encounter: Secondary | ICD-10-CM | POA: Diagnosis not present

## 2016-10-09 DIAGNOSIS — S72142D Displaced intertrochanteric fracture of left femur, subsequent encounter for closed fracture with routine healing: Secondary | ICD-10-CM | POA: Diagnosis not present

## 2016-10-09 DIAGNOSIS — I480 Paroxysmal atrial fibrillation: Secondary | ICD-10-CM | POA: Diagnosis not present

## 2016-10-12 DIAGNOSIS — F419 Anxiety disorder, unspecified: Secondary | ICD-10-CM | POA: Diagnosis not present

## 2016-10-12 DIAGNOSIS — W19XXXD Unspecified fall, subsequent encounter: Secondary | ICD-10-CM | POA: Diagnosis not present

## 2016-10-12 DIAGNOSIS — I10 Essential (primary) hypertension: Secondary | ICD-10-CM | POA: Diagnosis not present

## 2016-10-12 DIAGNOSIS — M9702XD Periprosthetic fracture around internal prosthetic left hip joint, subsequent encounter: Secondary | ICD-10-CM | POA: Diagnosis not present

## 2016-10-12 DIAGNOSIS — I480 Paroxysmal atrial fibrillation: Secondary | ICD-10-CM | POA: Diagnosis not present

## 2016-10-12 DIAGNOSIS — S72142D Displaced intertrochanteric fracture of left femur, subsequent encounter for closed fracture with routine healing: Secondary | ICD-10-CM | POA: Diagnosis not present

## 2016-10-13 ENCOUNTER — Encounter: Payer: Self-pay | Admitting: Internal Medicine

## 2016-10-13 DIAGNOSIS — R197 Diarrhea, unspecified: Secondary | ICD-10-CM | POA: Diagnosis not present

## 2016-10-13 DIAGNOSIS — K580 Irritable bowel syndrome with diarrhea: Secondary | ICD-10-CM | POA: Diagnosis not present

## 2016-10-13 DIAGNOSIS — F419 Anxiety disorder, unspecified: Secondary | ICD-10-CM | POA: Diagnosis not present

## 2016-10-13 DIAGNOSIS — N390 Urinary tract infection, site not specified: Secondary | ICD-10-CM | POA: Diagnosis not present

## 2016-10-15 DIAGNOSIS — M9702XD Periprosthetic fracture around internal prosthetic left hip joint, subsequent encounter: Secondary | ICD-10-CM | POA: Diagnosis not present

## 2016-10-15 DIAGNOSIS — I480 Paroxysmal atrial fibrillation: Secondary | ICD-10-CM | POA: Diagnosis not present

## 2016-10-15 DIAGNOSIS — S72142D Displaced intertrochanteric fracture of left femur, subsequent encounter for closed fracture with routine healing: Secondary | ICD-10-CM | POA: Diagnosis not present

## 2016-10-15 DIAGNOSIS — I10 Essential (primary) hypertension: Secondary | ICD-10-CM | POA: Diagnosis not present

## 2016-10-15 DIAGNOSIS — F419 Anxiety disorder, unspecified: Secondary | ICD-10-CM | POA: Diagnosis not present

## 2016-10-15 DIAGNOSIS — W19XXXD Unspecified fall, subsequent encounter: Secondary | ICD-10-CM | POA: Diagnosis not present

## 2016-10-21 DIAGNOSIS — Z0000 Encounter for general adult medical examination without abnormal findings: Secondary | ICD-10-CM | POA: Diagnosis not present

## 2016-11-13 ENCOUNTER — Ambulatory Visit: Payer: Medicare Other | Admitting: Internal Medicine

## 2017-02-02 DIAGNOSIS — Z23 Encounter for immunization: Secondary | ICD-10-CM | POA: Diagnosis not present

## 2017-02-02 DIAGNOSIS — F329 Major depressive disorder, single episode, unspecified: Secondary | ICD-10-CM | POA: Diagnosis not present

## 2017-02-02 DIAGNOSIS — R630 Anorexia: Secondary | ICD-10-CM | POA: Diagnosis not present

## 2017-02-02 DIAGNOSIS — I10 Essential (primary) hypertension: Secondary | ICD-10-CM | POA: Diagnosis not present

## 2017-02-02 DIAGNOSIS — R5383 Other fatigue: Secondary | ICD-10-CM | POA: Diagnosis not present

## 2017-02-02 DIAGNOSIS — R5381 Other malaise: Secondary | ICD-10-CM | POA: Diagnosis not present

## 2017-02-02 DIAGNOSIS — R634 Abnormal weight loss: Secondary | ICD-10-CM | POA: Diagnosis not present

## 2017-03-01 DIAGNOSIS — N390 Urinary tract infection, site not specified: Secondary | ICD-10-CM | POA: Diagnosis not present

## 2017-03-04 DIAGNOSIS — R5381 Other malaise: Secondary | ICD-10-CM | POA: Diagnosis not present

## 2017-03-04 DIAGNOSIS — F329 Major depressive disorder, single episode, unspecified: Secondary | ICD-10-CM | POA: Diagnosis not present

## 2017-03-04 DIAGNOSIS — N390 Urinary tract infection, site not specified: Secondary | ICD-10-CM | POA: Diagnosis not present

## 2017-03-04 DIAGNOSIS — R634 Abnormal weight loss: Secondary | ICD-10-CM | POA: Diagnosis not present

## 2018-05-04 IMAGING — CR DG CHEST 1V
1 series · 1 of 1 positions shown · non-contrast
Comparison: 12/08/2011

CLINICAL DATA: Recent falling.  Left hip pain after today's fall.

EXAM:
CHEST 1 VIEW

[x chest ap]
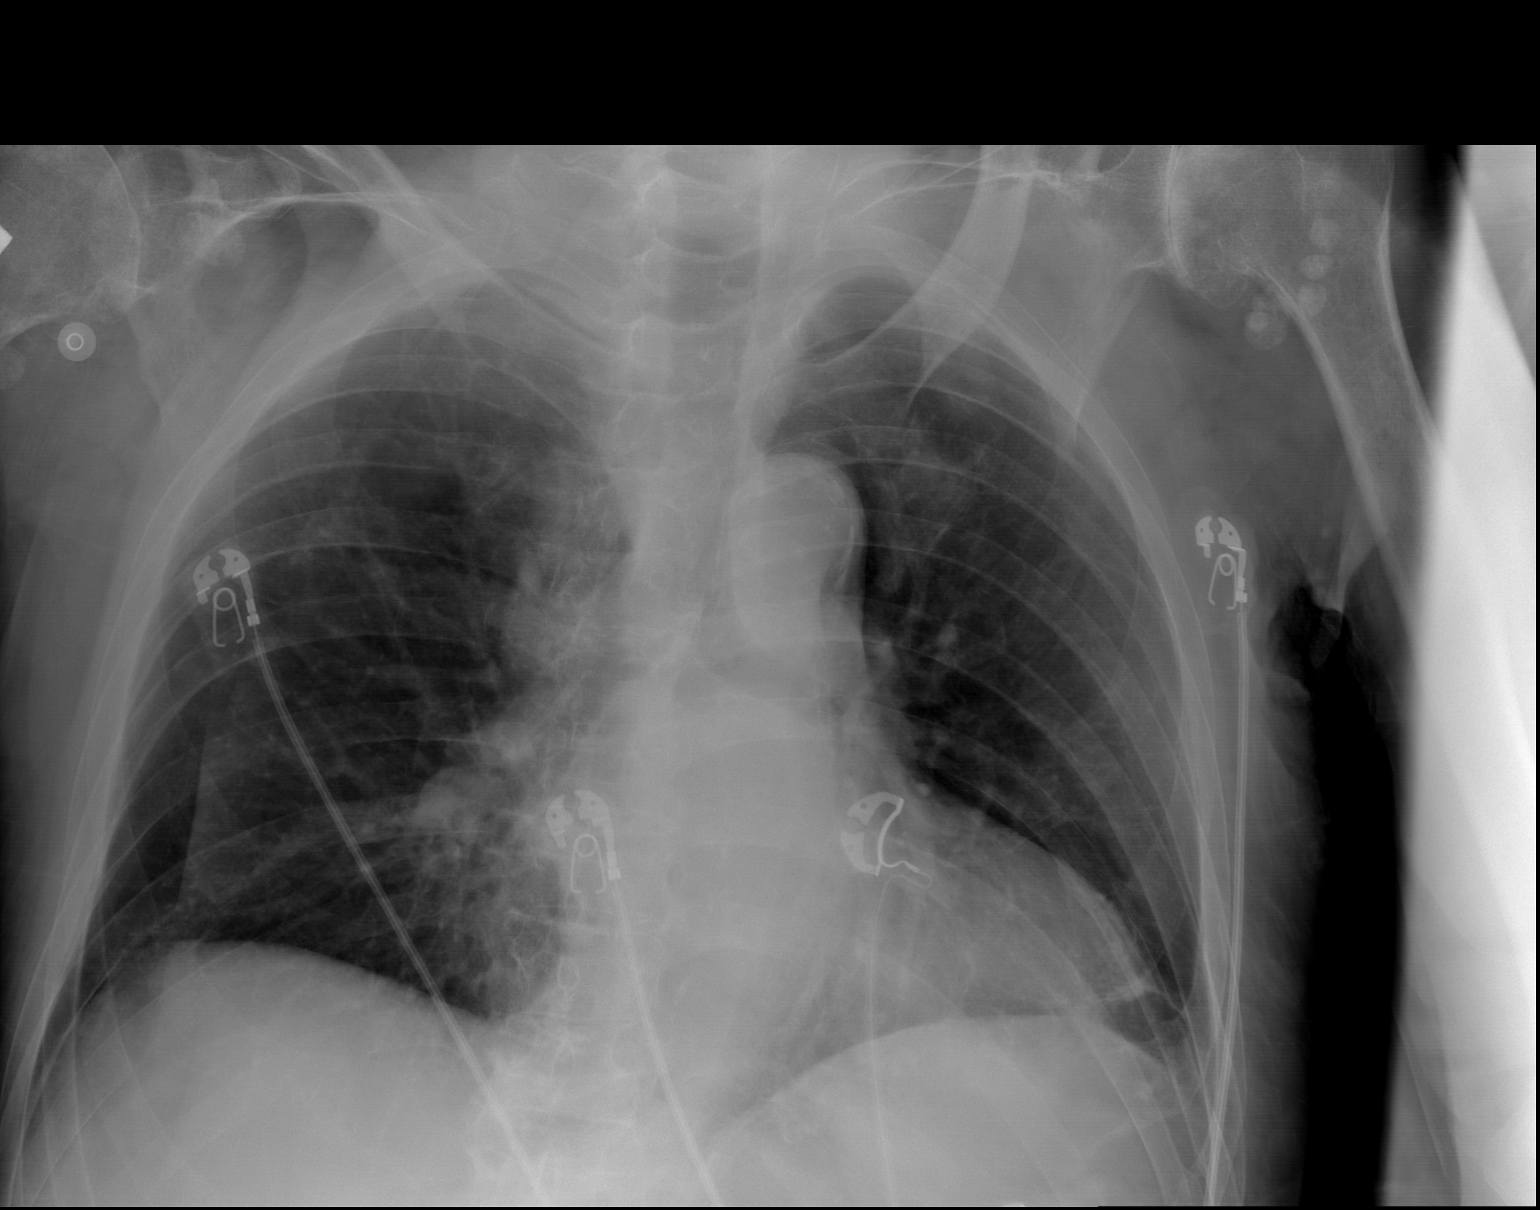

[1 of 1 positions shown; findings below may reference images not displayed]

FINDINGS: Lungs are clear without airspace disease or pulmonary edema.
Slightly low lung volumes with central vascular crowding.
Atherosclerotic calcifications at the aortic arch. There are chronic
round calcifications around the proximal left humerus. Severe joint
space narrowing and degenerative changes at the left shoulder joint.
Fullness in the right paratracheal region probably related to
vascular crowding and slightly low lung volumes.
IMPRESSION: Slightly low lung volumes.  No acute chest findings.

## 2018-05-04 IMAGING — CR DG HIP (WITH OR WITHOUT PELVIS) 2-3V*L*
4 series · 4 of 4 positions shown · non-contrast
Comparison: None.

CLINICAL DATA: Status post fall.  Left hip pain.

EXAM:
DG HIP (WITH OR WITHOUT PELVIS) 2-3V LEFT

[x pelvis (1 of 2)]
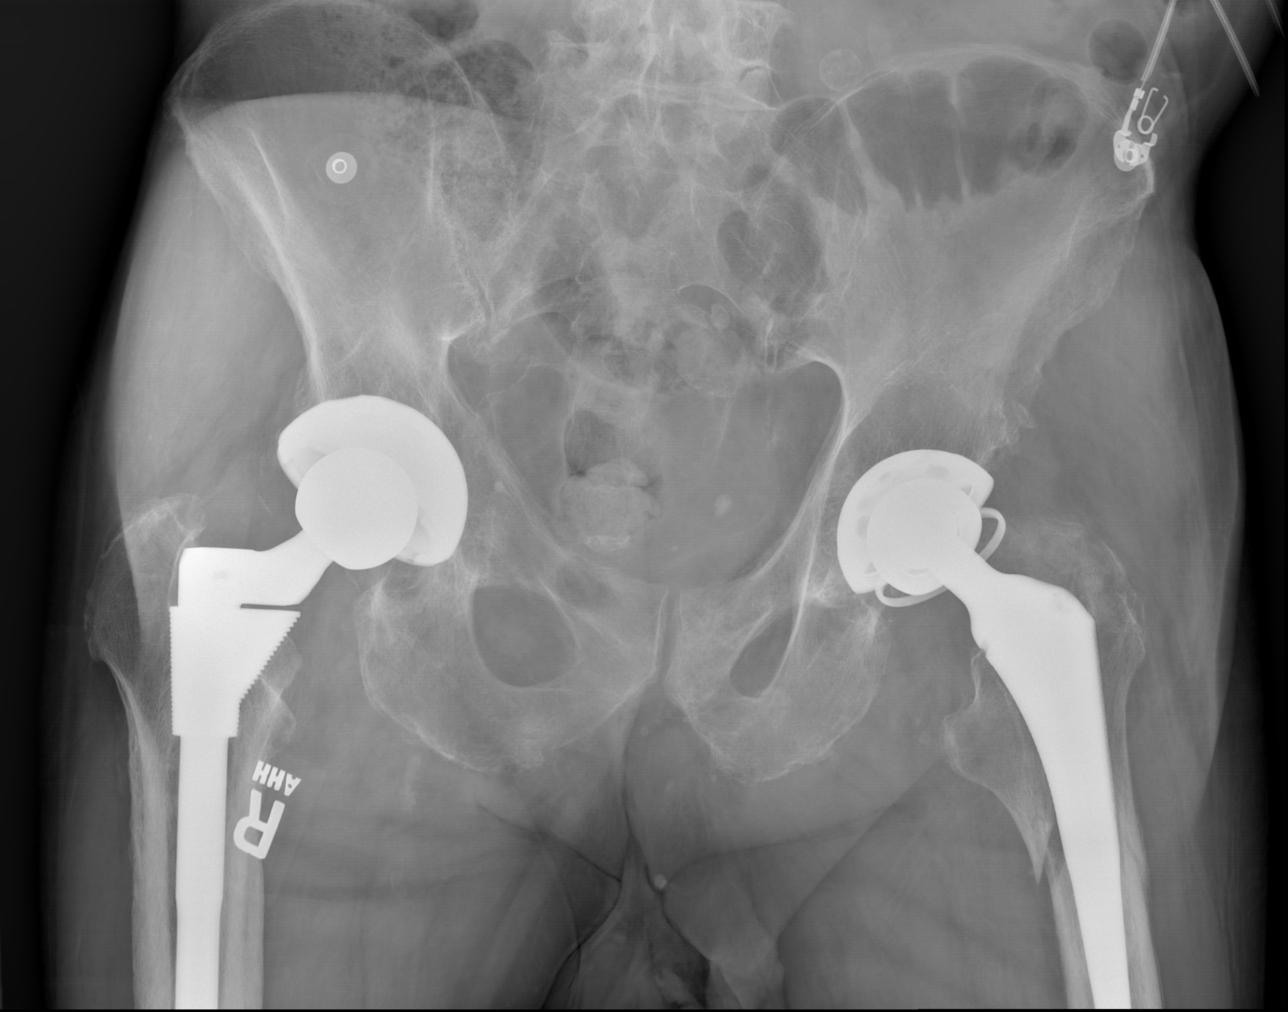

[x pelvis (2 of 2)]
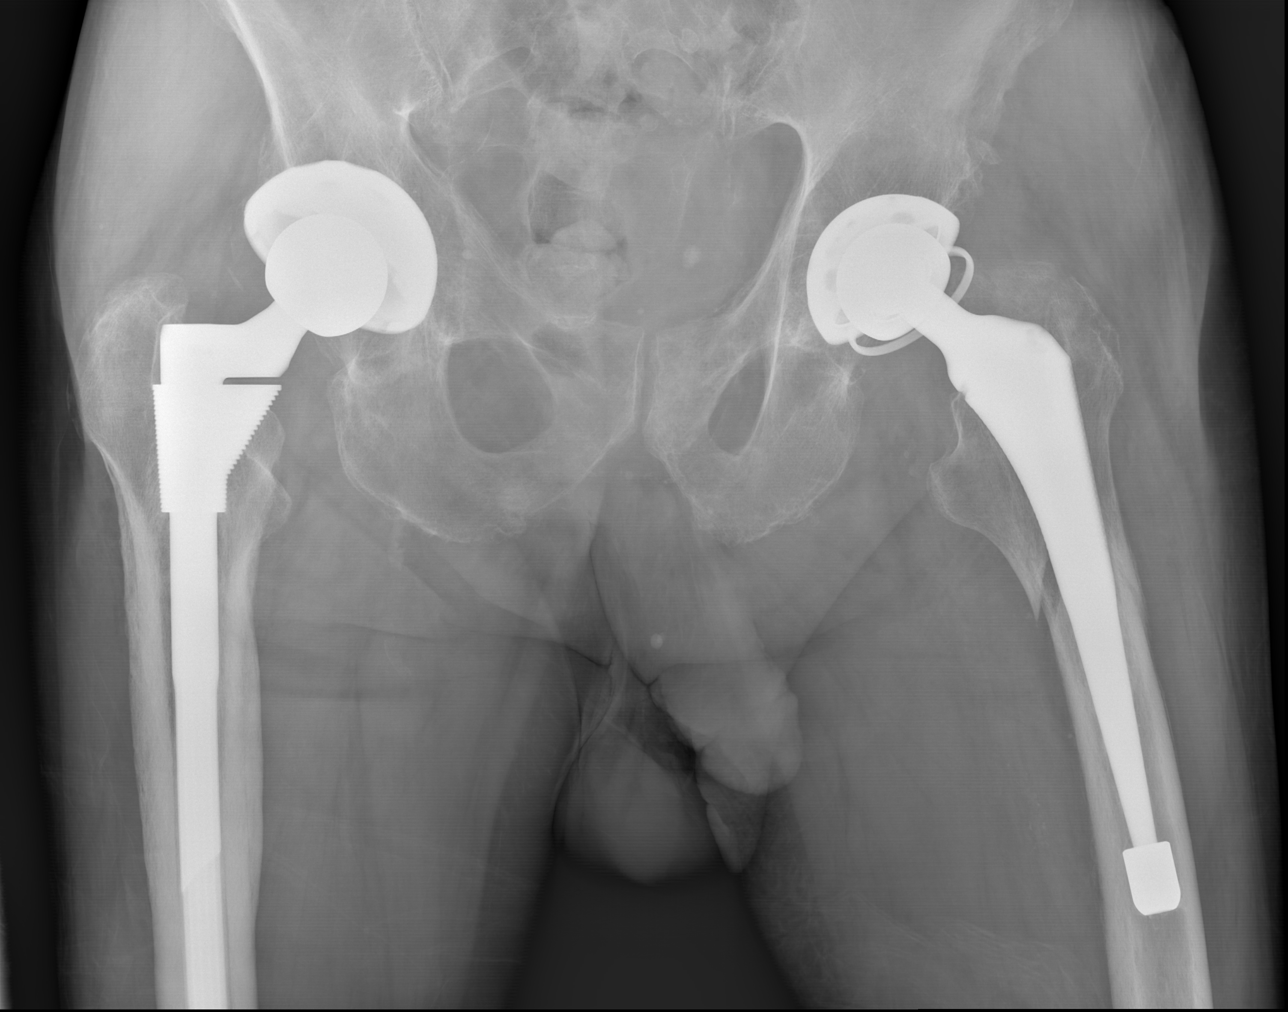

[x hip ap left]
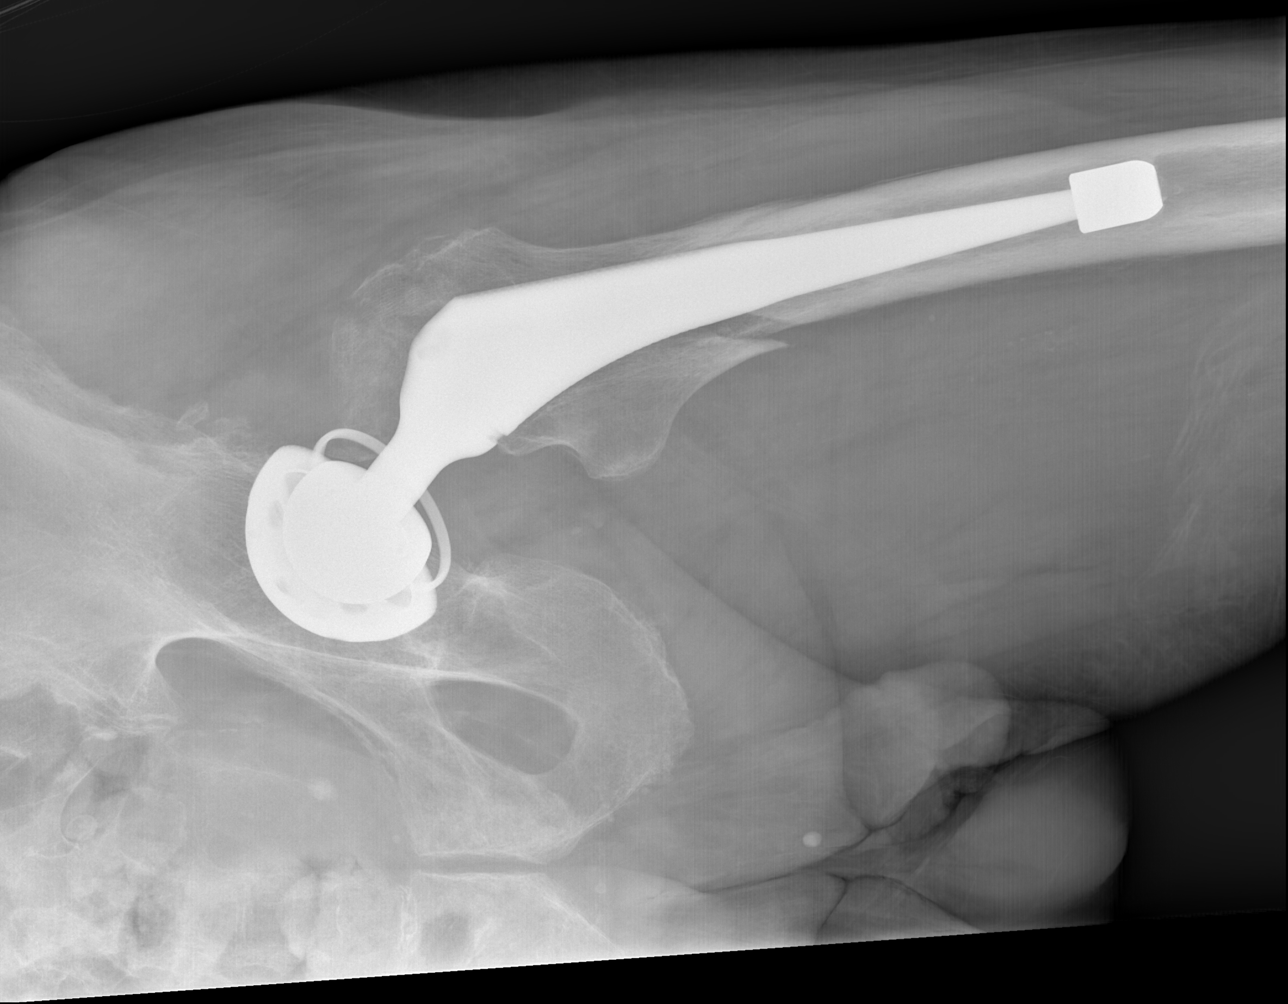

[w hip lat left]
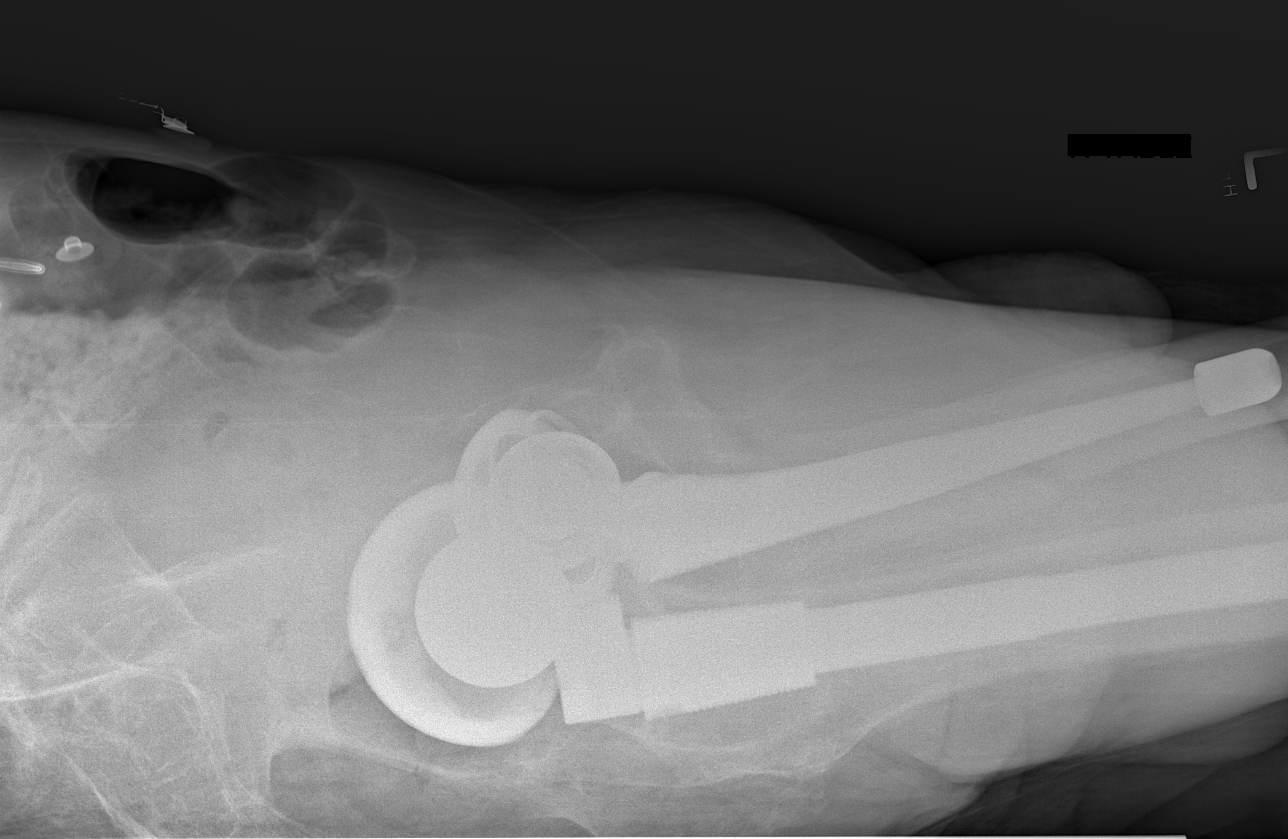

[4 of 4 positions shown; findings below may reference images not displayed]

FINDINGS: Right total hip arthroplasty without failure complication.

Left total hip arthroplasty. Acute oblique fracture extending from
the left greater trochanter, inferomedially into the subtrochanteric
region. Abnormal lucency surrounding the left acetabular cup
consistent with osteolysis as can be seen with particle disease.

Generalized osteopenia. Mild osteoarthritis of bilateral sacroiliac
joints.
IMPRESSION: Left total hip arthroplasty. Acute oblique fracture extending from
the left greater trochanter, inferomedially into the subtrochanteric
region.

Abnormal lucency surrounding the left acetabular cup consistent with
osteolysis as can be seen with particle disease.

## 2018-05-07 IMAGING — DX DG PORTABLE PELVIS
1 series · 1 of 1 positions shown · non-contrast
Comparison: 07/11/2016, 07/14/2016

CLINICAL DATA: Left periprosthetic hip fracture

EXAM:
PORTABLE PELVIS 1-2 VIEWS

[pelvis ap]
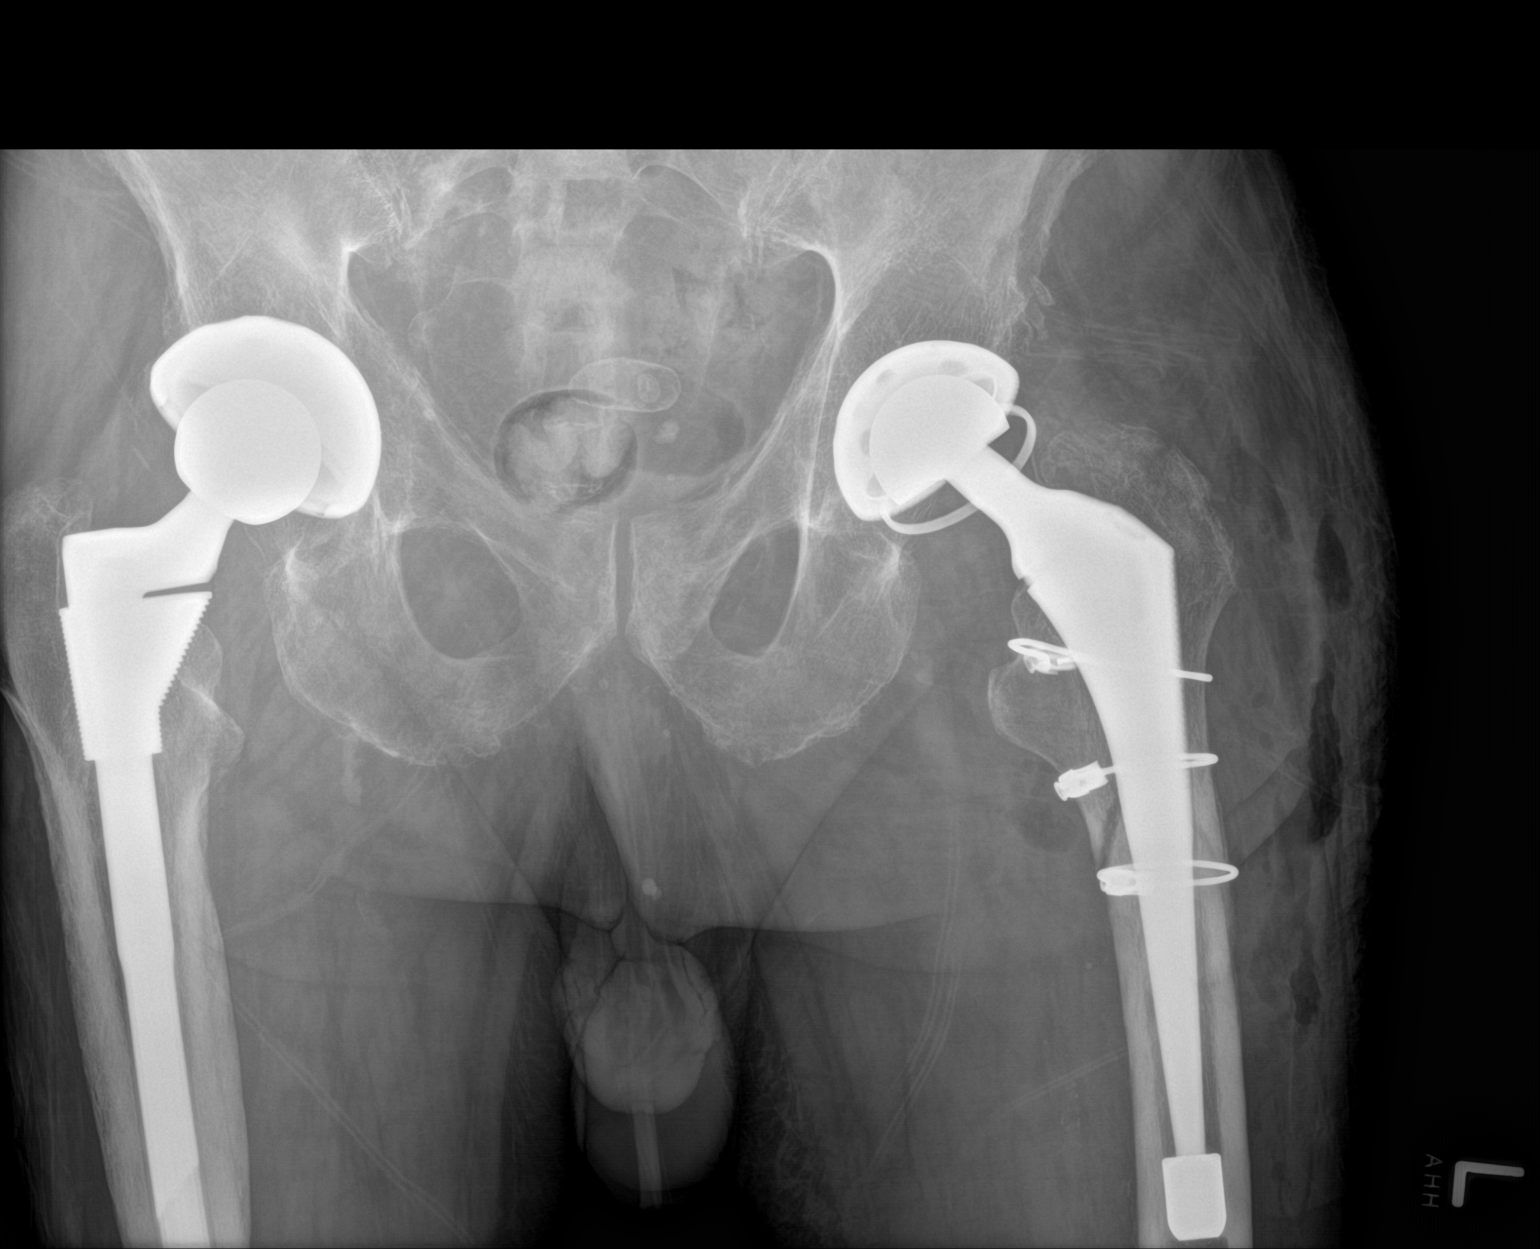

[1 of 1 positions shown; findings below may reference images not displayed]

FINDINGS: Bilateral hip replacements are seen. Fixation wires are now noted
along the proximal left femur with reduction of the previously
periprosthetic fracture.
IMPRESSION: Status post ORIF of proximal left femoral fracture.

## 2018-10-12 ENCOUNTER — Other Ambulatory Visit: Payer: Self-pay

## 2019-06-15 DIAGNOSIS — I499 Cardiac arrhythmia, unspecified: Secondary | ICD-10-CM | POA: Diagnosis not present

## 2019-06-15 DIAGNOSIS — R413 Other amnesia: Secondary | ICD-10-CM | POA: Diagnosis not present

## 2020-06-29 ENCOUNTER — Encounter (HOSPITAL_COMMUNITY)
Admission: EM | Disposition: A | Discharge status: Home / Self Care | Payer: Self-pay | Source: Home / Self Care | Attending: Cardiovascular Disease

## 2020-06-29 ENCOUNTER — Encounter (HOSPITAL_COMMUNITY): Payer: Self-pay

## 2020-06-29 ENCOUNTER — Other Ambulatory Visit: Payer: Self-pay

## 2020-06-29 ENCOUNTER — Inpatient Hospital Stay (HOSPITAL_COMMUNITY)
Admission: EM | Admit: 2020-06-29 | Discharge: 2020-07-02 | DRG: 280 | Disposition: A | Discharge status: Home / Self Care | Payer: Medicare Other | Attending: Cardiovascular Disease | Admitting: Cardiovascular Disease

## 2020-06-29 DIAGNOSIS — I10 Essential (primary) hypertension: Secondary | ICD-10-CM | POA: Diagnosis not present

## 2020-06-29 DIAGNOSIS — F419 Anxiety disorder, unspecified: Secondary | ICD-10-CM | POA: Diagnosis not present

## 2020-06-29 DIAGNOSIS — I214 Non-ST elevation (NSTEMI) myocardial infarction: Principal | ICD-10-CM | POA: Diagnosis present

## 2020-06-29 DIAGNOSIS — I110 Hypertensive heart disease with heart failure: Secondary | ICD-10-CM | POA: Diagnosis not present

## 2020-06-29 DIAGNOSIS — D7589 Other specified diseases of blood and blood-forming organs: Secondary | ICD-10-CM | POA: Diagnosis not present

## 2020-06-29 DIAGNOSIS — I480 Paroxysmal atrial fibrillation: Secondary | ICD-10-CM | POA: Diagnosis not present

## 2020-06-29 DIAGNOSIS — E785 Hyperlipidemia, unspecified: Secondary | ICD-10-CM | POA: Diagnosis not present

## 2020-06-29 DIAGNOSIS — I2510 Atherosclerotic heart disease of native coronary artery without angina pectoris: Secondary | ICD-10-CM | POA: Diagnosis not present

## 2020-06-29 DIAGNOSIS — D649 Anemia, unspecified: Secondary | ICD-10-CM

## 2020-06-29 DIAGNOSIS — I248 Other forms of acute ischemic heart disease: Secondary | ICD-10-CM | POA: Diagnosis not present

## 2020-06-29 DIAGNOSIS — R079 Chest pain, unspecified: Secondary | ICD-10-CM | POA: Diagnosis not present

## 2020-06-29 DIAGNOSIS — R54 Age-related physical debility: Secondary | ICD-10-CM | POA: Diagnosis present

## 2020-06-29 DIAGNOSIS — Z87891 Personal history of nicotine dependence: Secondary | ICD-10-CM

## 2020-06-29 DIAGNOSIS — I249 Acute ischemic heart disease, unspecified: Secondary | ICD-10-CM | POA: Diagnosis present

## 2020-06-29 DIAGNOSIS — I471 Supraventricular tachycardia: Secondary | ICD-10-CM | POA: Diagnosis not present

## 2020-06-29 DIAGNOSIS — I5021 Acute systolic (congestive) heart failure: Secondary | ICD-10-CM | POA: Diagnosis present

## 2020-06-29 DIAGNOSIS — R000 Tachycardia, unspecified: Secondary | ICD-10-CM | POA: Diagnosis not present

## 2020-06-29 DIAGNOSIS — Z79899 Other long term (current) drug therapy: Secondary | ICD-10-CM | POA: Diagnosis not present

## 2020-06-29 DIAGNOSIS — Z7982 Long term (current) use of aspirin: Secondary | ICD-10-CM

## 2020-06-29 DIAGNOSIS — Z20822 Contact with and (suspected) exposure to covid-19: Secondary | ICD-10-CM | POA: Diagnosis present

## 2020-06-29 DIAGNOSIS — I213 ST elevation (STEMI) myocardial infarction of unspecified site: Secondary | ICD-10-CM | POA: Diagnosis not present

## 2020-06-29 DIAGNOSIS — I959 Hypotension, unspecified: Secondary | ICD-10-CM | POA: Diagnosis present

## 2020-06-29 DIAGNOSIS — R32 Unspecified urinary incontinence: Secondary | ICD-10-CM | POA: Diagnosis present

## 2020-06-29 DIAGNOSIS — R0789 Other chest pain: Secondary | ICD-10-CM | POA: Diagnosis not present

## 2020-06-29 LAB — I-STAT CHEM 8, ED
BUN: 19 mg/dL (ref 8–23)
Calcium, Ion: 1.11 mmol/L — ABNORMAL LOW (ref 1.15–1.40)
Chloride: 104 mmol/L (ref 98–111)
Creatinine, Ser: 1.1 mg/dL (ref 0.61–1.24)
Glucose, Bld: 106 mg/dL — ABNORMAL HIGH (ref 70–99)
HCT: 37 % — ABNORMAL LOW (ref 39.0–52.0)
Hemoglobin: 12.6 g/dL — ABNORMAL LOW (ref 13.0–17.0)
Potassium: 3.8 mmol/L (ref 3.5–5.1)
Sodium: 140 mmol/L (ref 135–145)
TCO2: 27 mmol/L (ref 22–32)

## 2020-06-29 LAB — COMPREHENSIVE METABOLIC PANEL
ALT: 13 U/L (ref 0–44)
AST: 29 U/L (ref 15–41)
Albumin: 2.8 g/dL — ABNORMAL LOW (ref 3.5–5.0)
Alkaline Phosphatase: 69 U/L (ref 38–126)
Anion gap: 9 (ref 5–15)
BUN: 17 mg/dL (ref 8–23)
CO2: 25 mmol/L (ref 22–32)
Calcium: 8.3 mg/dL — ABNORMAL LOW (ref 8.9–10.3)
Chloride: 104 mmol/L (ref 98–111)
Creatinine, Ser: 1.22 mg/dL (ref 0.61–1.24)
GFR, Estimated: 56 mL/min — ABNORMAL LOW (ref >60)
Glucose, Bld: 111 mg/dL — ABNORMAL HIGH (ref 70–99)
Potassium: 3.9 mmol/L (ref 3.5–5.1)
Sodium: 138 mmol/L (ref 135–145)
Total Bilirubin: 0.5 mg/dL (ref 0.3–1.2)
Total Protein: 6.2 g/dL — ABNORMAL LOW (ref 6.5–8.1)

## 2020-06-29 LAB — CBC WITH DIFFERENTIAL/PLATELET
Abs Immature Granulocytes: 0.03 10*3/uL (ref 0.00–0.07)
Basophils Absolute: 0 10*3/uL (ref 0.0–0.1)
Basophils Relative: 1 %
Eosinophils Absolute: 0.1 10*3/uL (ref 0.0–0.5)
Eosinophils Relative: 2 %
HCT: 38.7 % — ABNORMAL LOW (ref 39.0–52.0)
Hemoglobin: 12.6 g/dL — ABNORMAL LOW (ref 13.0–17.0)
Immature Granulocytes: 1 %
Lymphocytes Relative: 23 %
Lymphs Abs: 1.3 10*3/uL (ref 0.7–4.0)
MCH: 33.6 pg (ref 26.0–34.0)
MCHC: 32.6 g/dL (ref 30.0–36.0)
MCV: 103.2 fL — ABNORMAL HIGH (ref 80.0–100.0)
Monocytes Absolute: 0.4 10*3/uL (ref 0.1–1.0)
Monocytes Relative: 7 %
Neutro Abs: 3.8 10*3/uL (ref 1.7–7.7)
Neutrophils Relative %: 66 %
Platelets: 196 10*3/uL (ref 150–400)
RBC: 3.75 MIL/uL — ABNORMAL LOW (ref 4.22–5.81)
RDW: 14.5 % (ref 11.5–15.5)
WBC: 5.6 10*3/uL (ref 4.0–10.5)
nRBC: 0 % (ref 0.0–0.2)

## 2020-06-29 LAB — APTT: aPTT: 30 seconds (ref 24–36)

## 2020-06-29 LAB — RESP PANEL BY RT-PCR (FLU A&B, COVID) ARPGX2
Influenza A by PCR: NEGATIVE
Influenza B by PCR: NEGATIVE
SARS Coronavirus 2 by RT PCR: NEGATIVE

## 2020-06-29 LAB — LIPID PANEL
Cholesterol: 159 mg/dL (ref 0–200)
HDL: 42 mg/dL (ref >40)
LDL Cholesterol: 94 mg/dL (ref 0–99)
Total CHOL/HDL Ratio: 3.8 RATIO
Triglycerides: 114 mg/dL (ref <150)
VLDL: 23 mg/dL (ref 0–40)

## 2020-06-29 LAB — TROPONIN I (HIGH SENSITIVITY): Troponin I (High Sensitivity): 850 ng/L (ref <18)

## 2020-06-29 LAB — PROTIME-INR
INR: 1.2 (ref 0.8–1.2)
Prothrombin Time: 15.4 seconds — ABNORMAL HIGH (ref 11.4–15.2)

## 2020-06-29 SURGERY — CORONARY/GRAFT ACUTE MI REVASCULARIZATION
Anesthesia: LOCAL

## 2020-06-29 MED ORDER — HEPARIN SODIUM (PORCINE) 5000 UNIT/ML IJ SOLN
4000.0000 [IU] | Freq: Once | INTRAMUSCULAR | Status: AC
  Administered 2020-06-29: 4000 [IU] via INTRAVENOUS

## 2020-06-29 MED ORDER — NITROGLYCERIN 0.4 MG SL SUBL: 0.4000 mg | SUBLINGUAL_TABLET | SUBLINGUAL | Status: DC | PRN

## 2020-06-29 MED ORDER — ONDANSETRON HCL 4 MG/2ML IJ SOLN: 4.0000 mg | Freq: Four times a day (QID) | INTRAMUSCULAR | Status: DC | PRN

## 2020-06-29 MED ORDER — ASPIRIN 81 MG PO CHEW: 324.0000 mg | CHEWABLE_TABLET | ORAL | Status: AC

## 2020-06-29 MED ORDER — ACETAMINOPHEN 325 MG PO TABS
650.0000 mg | ORAL_TABLET | ORAL | Status: DC | PRN
  Administered 2020-07-02: 650 mg via ORAL
  Filled 2020-06-29: qty 2

## 2020-06-29 MED ORDER — METOPROLOL TARTRATE 12.5 MG HALF TABLET
12.5000 mg | ORAL_TABLET | Freq: Two times a day (BID) | ORAL | Status: DC
  Administered 2020-06-30: 12.5 mg via ORAL
  Filled 2020-06-29: qty 1

## 2020-06-29 MED ORDER — ATORVASTATIN CALCIUM 40 MG PO TABS
40.0000 mg | ORAL_TABLET | Freq: Every day | ORAL | Status: DC
  Administered 2020-06-30 – 2020-07-02 (×3): 40 mg via ORAL
  Filled 2020-06-29 (×3): qty 1

## 2020-06-29 MED ORDER — HEPARIN (PORCINE) 25000 UT/250ML-% IV SOLN
1100.0000 [IU]/h | INTRAVENOUS | Status: AC
  Administered 2020-06-29 – 2020-06-30 (×2): 900 [IU]/h via INTRAVENOUS
  Administered 2020-07-01: 1100 [IU]/h via INTRAVENOUS
  Filled 2020-06-29 (×3): qty 250

## 2020-06-29 MED ORDER — NITROGLYCERIN IN D5W 200-5 MCG/ML-% IV SOLN
0.0000 ug/min | INTRAVENOUS | Status: DC
  Administered 2020-06-29: 5 ug/min via INTRAVENOUS
  Filled 2020-06-29: qty 250

## 2020-06-29 MED ORDER — ASPIRIN EC 81 MG PO TBEC
81.0000 mg | DELAYED_RELEASE_TABLET | Freq: Every day | ORAL | Status: DC
  Administered 2020-06-30 – 2020-07-02 (×3): 81 mg via ORAL
  Filled 2020-06-29 (×3): qty 1

## 2020-06-29 MED ORDER — ASPIRIN 300 MG RE SUPP: 300.0000 mg | RECTAL | Status: AC

## 2020-06-29 MED ORDER — NITROGLYCERIN IN D5W 200-5 MCG/ML-% IV SOLN
0.0000 ug/min | INTRAVENOUS | Status: DC
  Administered 2020-06-30: 5 ug/min via INTRAVENOUS

## 2020-06-29 MED ORDER — SODIUM CHLORIDE 0.9 % IV SOLN: INTRAVENOUS | Status: DC

## 2020-06-29 NOTE — ED Provider Notes (Signed)
[facility] EMERGENCY DEPARTMENT Provider Note   CSN: [id number] Arrival date & time: 06/29/20  2119     History Chief Complaint  Patient presents with  . Code STEMI    Tom Ortiz is a 87 y.o. male.  HPI Patient is a 87 year old male with a past medical history of arthritis and hypertension though he has been lost to follow-up presenting with a chief complaint of chest pain.  Patient brought in as a code STEMI by EMS due to concern for ST elevations in the inferior leads.  Patient denies fevers or chills, nausea vomiting, syncope or shortness of breath at this time.  Chest pain resolved on arrival here. Patient given aspirin in transit and loaded with heparin on arrival. Patient does not take any medicines on a normal day occasionally takes aspirin for his arthritis symptoms.    Past Medical History:  Diagnosis Date  . Anxiety   . Arthritis   . Essential hypertension 04/16/2014  . Hard of hearing    tinnitus both ears  . Hypertension   . Paroxysmal atrial fibrillation ([place]) 04/28/2014  . Shoulder pain, left 07/14/2016   states left rotator pain preop    Patient Active Problem List   Diagnosis Date Noted  . ACS (acute coronary syndrome) ([place]) 06/29/2020  . Closed intertrochanteric fracture of left hip, with delayed healing, subsequent encounter 07/12/2016  . Gait instability 07/12/2016  . Hip fracture ([place]) 07/11/2016  . Paroxysmal atrial fibrillation ([place]) 04/28/2014  . Irregular heart rhythm 04/16/2014  . Nausea 04/16/2014  . Anxiety 04/16/2014  . Essential hypertension 04/16/2014  . Postop Acute blood loss anemia 01/29/2012  . Postop Hyponatremia 10/05/2011  . 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 APPENDECTOMY  yrs ago  . HERNIA REPAIR  yrs ago  . HIP CLOSED REDUCTION  07/01/2011, left   Procedure: [place];  Surgeon: Magnus Sinning, MD;  Location: [facility];   Service: Orthopedics;  Laterality: Left;  . HIP CLOSED REDUCTION  06/11/2011   Procedure: CLOSED MANIPULATION HIP;  Surgeon: Magnus Sinning, MD;  Location: [facility];  Service: Orthopedics;  Laterality: Left;  . HIP CLOSED REDUCTION  07/01/2011   Procedure: CLOSED MANIPULATION HIP;  Surgeon: Magnus Sinning, MD;  Location: [facility];  Service: Orthopedics;  Laterality: Left;  . HIP CLOSED REDUCTION  12/08/2011   Procedure: CLOSED MANIPULATION HIP;  Surgeon: Sydnee Cabal, MD;  Location: [facility];  Service: Orthopedics;  Laterality: Left;  . JOINT REPLACEMENT  2007 right, left 17 yrs ago   Bilateral hip  . NM MYOVIEW LTD  2012  . ORIF PERIPROSTHETIC FRACTURE Left 07/14/2016   Procedure: OPEN REDUCTION INTERNAL FIXATION (ORIF) PERIPROSTHETIC FRACTURE LEFT HIP;  Surgeon: Paralee Cancel, MD;  Location: [facility];  Service: Orthopedics;  Laterality: Left;  . TONSILLECTOMY  as child  . TOTAL HIP REVISION  10/02/2011   Procedure: TOTAL HIP REVISION;  Surgeon: Gearlean Alf, MD;  Location: [facility];  Service: Orthopedics;  Laterality: Right;  . TOTAL HIP REVISION  01/27/2012   Procedure: TOTAL HIP REVISION;  Surgeon: Gearlean Alf, MD;  Location: [facility];  Service: Orthopedics;  Laterality: Left;       History reviewed. No pertinent family history.  Social History   Tobacco Use  . Smoking status: Former Smoker    Packs/day: 0.50    Years: 20.00    Pack years: 10.00  Types: Cigarettes    Start date: 07/12/1973    Quit date: 07/12/1993    Years since quitting: 26.9  . Smokeless tobacco: Former Systems developer    Quit date: 01/21/1962  . Tobacco comment: QUIT 21 YEARS AGP  Substance Use Topics  . Alcohol use: No    Alcohol/week: 0.0 standard drinks  . Drug use: No    Home Medications Prior to Admission medications   Medication Sig Start Date End Date Taking? Authorizing Provider  aspirin EC 325 MG tablet Take 325 mg by mouth daily as needed for mild pain.   Yes [provider]  bismuth  subsalicylate (PEPTO BISMOL) 262 MG/15ML suspension Take 30 mLs by mouth every 6 (six) hours as needed for indigestion or diarrhea or loose stools.   Yes [provider]  docusate sodium (COLACE) 100 MG capsule Take 1 capsule (100 mg total) by mouth 2 (two) times daily. Patient taking differently: Take 100 mg by mouth daily as needed for mild constipation. 07/12/16  Yes Tat, Shanon Brow, MD  feeding supplement, ENSURE ENLIVE, (ENSURE ENLIVE) LIQD Take 237 mLs by mouth 2 (two) times daily between meals. Patient taking differently: Take 237 mLs by mouth daily as needed (weight gain). 07/16/16  Yes Mikhail, [place], DO  HYDROcodone-acetaminophen (NORCO/VICODIN) 5-325 MG tablet Take 1-2 tablets by mouth every 6 (six) hours as needed for moderate pain. 07/14/16  Yes Babish, [place]man [place], PA-C  LORazepam (ATIVAN) 0.5 MG tablet Take 0.5 mg by mouth at bedtime as needed for sleep.  07/07/16  Yes [provider]  MELATONIN PO Take 15-20 mg by mouth 2 (two) times daily as needed (sleep).   Yes [provider]  dicyclomine (BENTYL) 10 MG capsule Take 10 mg by mouth daily as needed (for stomach upset).  Patient not taking: Reported on 06/29/2020 04/30/16   [provider]  ferrous sulfate 325 (65 FE) MG tablet Take 1 tablet (325 mg total) by mouth 2 (two) times daily with a meal. Patient not taking: Reported on 06/29/2020 07/16/16   Cristal Ford, DO  methocarbamol (ROBAXIN) 500 MG tablet Take 1 tablet (500 mg total) by mouth 4 (four) times daily. Patient not taking: Reported on 06/29/2020 07/14/16   Danae Orleans, PA-C  metoprolol succinate (TOPROL-XL) 25 MG 24 hr tablet Take 1 tablet (25 mg total) by mouth daily. NEED OV. Patient not taking: Reported on 06/29/2020 10/25/14   Lelon Perla, MD  Multiple Vitamin (MULTIVITAMIN WITH MINERALS) TABS tablet Take 1 tablet by mouth daily. Patient not taking: Reported on 06/29/2020 07/16/16   Cristal Ford, DO  senna (SENOKOT) 8.6 MG TABS tablet Take  1 tablet (8.6 mg total) by mouth daily. Patient not taking: Reported on 06/29/2020 07/13/16   Orson Eva, MD    Allergies    Fosamax [alendronate], Vilazodone hcl, and Quinolones  Review of Systems   Review of Systems  Constitutional: Negative for chills and fever.  HENT: Negative for ear pain and sore throat.   Eyes: Negative for pain and visual disturbance.  Respiratory: Negative for cough and shortness of breath.   Cardiovascular: Positive for chest pain. Negative for palpitations.  Gastrointestinal: Negative for abdominal pain and vomiting.  Genitourinary: Negative for dysuria and hematuria.  Musculoskeletal: Positive for arthralgias and joint swelling. Negative for back pain.  Skin: Negative for color change and rash.  Neurological: Negative for seizures and syncope.  All other systems reviewed and are negative.   Physical Exam Updated Vital Signs BP (!) 158/86   Pulse 90  Temp 97.8 F (36.6 C) (Temporal)   Resp (!) 24   Ht 5\' 10"  (1.778 m)   Wt 74.8 kg   SpO2 97%   BMI 23.66 kg/m   Physical Exam Vitals and nursing note reviewed.  Constitutional:      Appearance: He is well-developed.  HENT:     Head: Normocephalic and atraumatic.     Nose: No congestion or rhinorrhea.     Mouth/Throat:     Mouth: Mucous membranes are moist.     Pharynx: Oropharynx is clear. No oropharyngeal exudate.  Eyes:     Conjunctiva/sclera: Conjunctivae normal.     Pupils: Pupils are equal, round, and reactive to light.  Cardiovascular:     Rate and Rhythm: Normal rate and regular rhythm.     Heart sounds: No murmur heard.   Pulmonary:     Effort: Pulmonary effort is normal. No respiratory distress.     Breath sounds: Normal breath sounds.  Abdominal:     Palpations: Abdomen is soft.     Tenderness: There is no abdominal tenderness.  Musculoskeletal:        General: No swelling, tenderness, deformity or signs of injury. Normal range of motion.     Cervical back: Neck supple. No  rigidity or tenderness.  Skin:    General: Skin is warm and dry.  Neurological:     General: No focal deficit present.     Mental Status: He is alert and oriented to person, place, and time. Mental status is at baseline.     Cranial Nerves: No cranial nerve deficit.     Motor: No weakness.     ED Results / Procedures / Treatments   Labs (all labs ordered are listed, but only abnormal results are displayed) Labs Reviewed  CBC WITH DIFFERENTIAL/PLATELET - Abnormal; Notable for the following components:      Result Value   RBC 3.75 (*)    Hemoglobin 12.6 (*)    HCT 38.7 (*)    MCV 103.2 (*)    All other components within normal limits  PROTIME-INR - Abnormal; Notable for the following components:   Prothrombin Time 15.4 (*)    All other components within normal limits  COMPREHENSIVE METABOLIC PANEL - Abnormal; Notable for the following components:   Glucose, Bld 111 (*)    Calcium 8.3 (*)    Total Protein 6.2 (*)    Albumin 2.8 (*)    GFR, Estimated 56 (*)    All other components within normal limits  I-STAT CHEM 8, ED - Abnormal; Notable for the following components:   Glucose, Bld 106 (*)    Calcium, Ion 1.11 (*)    Hemoglobin 12.6 (*)    HCT 37.0 (*)    All other components within normal limits  TROPONIN I (HIGH SENSITIVITY) - Abnormal; Notable for the following components:   Troponin I (High Sensitivity) 850 (*)    All other components within normal limits  RESP PANEL BY RT-PCR (FLU A&B, COVID) ARPGX2  APTT  LIPID PANEL  HEMOGLOBIN A1C  HEPARIN LEVEL (UNFRACTIONATED)  CBC  COMPREHENSIVE METABOLIC PANEL  TSH  CBC WITH DIFFERENTIAL/PLATELET  LIPID PANEL  TROPONIN I (HIGH SENSITIVITY)  TROPONIN I (HIGH SENSITIVITY)    EKG EKG Interpretation  Date/Time:  Saturday June 29 2020 21:23:11 EDT Ventricular Rate:  100 PR Interval:  222 QRS Duration: 73 QT Interval:  367 QTC Calculation: 474 R Axis:   -29 Text Interpretation: Sinus tachycardia Paired ventricular  premature complexes  Prolonged PR interval Inferior infarct, old Anterior infarct, old , new ST elevation consider anterior injury or acute infarct Confirmed by Blanchie Dessert [phone]) on 06/29/2020 9:34:59 PM   Radiology No results found.  Procedures Procedures   Medications Ordered in ED Medications  0.9 %  sodium chloride infusion (0 mLs Intravenous Hold 06/29/20 2220)  nitroGLYCERIN 50 mg in dextrose 5 % 250 mL (0.2 mg/mL) infusion (20 mcg/min Intravenous Rate/Dose Change 06/29/20 2253)  heparin ADULT infusion 100 units/mL (25000 units/284mL) (900 Units/hr Intravenous New Bag/Given 06/29/20 2202)  aspirin chewable tablet 324 mg (has no administration in time range)    Or  aspirin suppository 300 mg (has no administration in time range)  aspirin EC tablet 81 mg (has no administration in time range)  nitroGLYCERIN (NITROSTAT) SL tablet 0.4 mg (has no administration in time range)  acetaminophen (TYLENOL) tablet 650 mg (has no administration in time range)  ondansetron (ZOFRAN) injection 4 mg (has no administration in time range)  metoprolol tartrate (LOPRESSOR) tablet 12.5 mg (has no administration in time range)  atorvastatin (LIPITOR) tablet 40 mg (has no administration in time range)  nitroGLYCERIN 50 mg in dextrose 5 % 250 mL (0.2 mg/mL) infusion (has no administration in time range)  heparin injection 4,000 Units (4,000 Units Intravenous Given 06/29/20 2129)    ED Course  I have reviewed the triage vital signs and the nursing notes.  Pertinent labs & imaging results that were available during my care of the patient were reviewed by me and considered in my medical decision making (see chart for details).    MDM Rules/Calculators/A&P                          Patient is a 87 year old male with a history of hypertension and arthritis presenting with a chief complaint of chest pain.  Physical exam reveals no acute abnormalities at this time.  Vital signs within normal limits the  patient is mildly hypertensive.  Patient treated as a code STEMI emergently on patient's arrival.  Cardiology consulted to patient's bedside. EKG on arrival here with no elevations and patient in no acute distress.  Reviewed strips transmitted via EMS that were initially concerning for inferior infarction now without abnormality.  Patient already loaded with aspirin and loaded with heparin emergently upon arrival. Discussed case and current EKGs with cardiology who evaluated the patient.  Is felt that patient is unlikely to be actively occluded and given his age and comorbidities would be unlikely to benefit from cardiac catheterization at this time.  Patient started on nitro infusion at this time and patient to be admitted to cardiology for continued care and management of his chest pain.  Final Clinical Impression(s) / ED Diagnoses Final diagnoses:  None    Rx / DC Orders ED Discharge Orders    None       Tretha Sciara, MD 06/29/20 2310    Blanchie Dessert, MD 06/29/20 2357

## 2020-06-29 NOTE — H&P (Signed)
Cardiology Admission History and Physical:   Patient ID: Tom Ortiz MRN: [id number]; DOB: May 08, 1927   Admission date: 06/29/2020  PCP:  Thressa Sheller, MD (Inactive)   [place]  Cardiologist:  new Advanced Practice Provider:  No care team member to display Electrophysiologist:  None    Chief Complaint:  Chest pain  Patient Profile:   Tom Ortiz is a 87 y.o. male significant medical history.  A code STEMI was activated by EMS and transported to [facility] his chest pain resolved and ECG significantly proved  History of Present Illness:   Tom Ortiz denies any significant medical history.  He does not have a routine medical doctor.  He is on any medications.  He takes an occasional aspirin.  Apparently this afternoon at approximately 4 PM he developed some vague chest discomfort.  Chest pain persisted off and on.  Ultimately around 830 this evening, family called EMS.  Upon EMS arrival the patient was having some chest pain.  His ECG showed Q waves in lead III, aVF with 1 to 2 mm of ST elevation with ST with 1 mm ST elevation in V4 through V6 6.  Q waves are also present V1 through V4.  The entrance point after he received medication, apparently had complete resolution of chest pain.  Subsequent ECG showed resolution of ST elevation.  Upon arrival to the emergency room he presently is completely pain-free.  ECG shows resolution of ST elevation but there is mild T wave abnormality in V5 and V6 as well as aVF.  The patient's son had arrived.  With the patient being completely pain-free presently, after discussion with the son, the decision was made to cancel emergent catheterization date medical therapy.   Past Medical History:  Diagnosis Date  . Anxiety   . Arthritis   . Essential hypertension 04/16/2014  . Hard of hearing    tinnitus both ears  . Hypertension   . Paroxysmal atrial fibrillation ([place]) 04/28/2014  . Shoulder pain, left 07/14/2016    states left rotator pain preop    Past Surgical History:  Procedure Laterality Date  . APPENDECTOMY  yrs ago  . HERNIA REPAIR  yrs ago  . HIP CLOSED REDUCTION  07/01/2011, left   Procedure: [place];  Surgeon: Magnus Sinning, MD;  Location: [facility];  Service: Orthopedics;  Laterality: Left;  . HIP CLOSED REDUCTION  06/11/2011   Procedure: CLOSED MANIPULATION HIP;  Surgeon: Magnus Sinning, MD;  Location: [facility];  Service: Orthopedics;  Laterality: Left;  . HIP CLOSED REDUCTION  07/01/2011   Procedure: CLOSED MANIPULATION HIP;  Surgeon: Magnus Sinning, MD;  Location: [facility];  Service: Orthopedics;  Laterality: Left;  . HIP CLOSED REDUCTION  12/08/2011   Procedure: CLOSED MANIPULATION HIP;  Surgeon: Sydnee Cabal, MD;  Location: [facility];  Service: Orthopedics;  Laterality: Left;  . JOINT REPLACEMENT  2007 right, left 17 yrs ago   Bilateral hip  . NM MYOVIEW LTD  2012  . ORIF PERIPROSTHETIC FRACTURE Left 07/14/2016   Procedure: OPEN REDUCTION INTERNAL FIXATION (ORIF) PERIPROSTHETIC FRACTURE LEFT HIP;  Surgeon: Paralee Cancel, MD;  Location: [facility];  Service: Orthopedics;  Laterality: Left;  . TONSILLECTOMY  as child  . TOTAL HIP REVISION  10/02/2011   Procedure: TOTAL HIP REVISION;  Surgeon: Gearlean Alf, MD;  Location: [facility];  Service: Orthopedics;  Laterality: Right;  . TOTAL HIP REVISION  01/27/2012   Procedure: TOTAL HIP REVISION;  Surgeon: Gearlean Alf, MD;  Location: [facility];  Service: Orthopedics;  Laterality: Left;     Medications Prior to Admission: Prior to Admission medications   Medication Sig Start Date End Date Taking? Authorizing Provider  HYDROcodone-acetaminophen (NORCO/VICODIN) 5-325 MG tablet Take 1-2 tablets by mouth every 6 (six) hours as needed for moderate pain. 07/14/16  Yes Babish, Rodman Key, PA-C  LORazepam (ATIVAN) 0.5 MG tablet Take 0.5 mg by mouth at bedtime as needed for sleep.  07/07/16  Yes [provider]  dicyclomine (BENTYL) 10  MG capsule Take 10 mg by mouth daily as needed (for stomach upset).  04/30/16   [provider]  docusate sodium (COLACE) 100 MG capsule Take 1 capsule (100 mg total) by mouth 2 (two) times daily. 07/12/16   Orson Eva, MD  feeding supplement, ENSURE ENLIVE, (ENSURE ENLIVE) LIQD Take 237 mLs by mouth 2 (two) times daily between meals. 07/16/16   Cristal Ford, DO  ferrous sulfate 325 (65 FE) MG tablet Take 1 tablet (325 mg total) by mouth 2 (two) times daily with a meal. 07/16/16   Mikhail, Velta Addison, DO  methocarbamol (ROBAXIN) 500 MG tablet Take 1 tablet (500 mg total) by mouth 4 (four) times daily. 07/14/16   Danae Orleans, PA-C  metoprolol succinate (TOPROL-XL) 25 MG 24 hr tablet Take 1 tablet (25 mg total) by mouth daily. NEED OV. Patient taking differently: Take 25 mg by mouth 2 (two) times daily. NEED OV. 10/25/14   Lelon Perla, MD  Multiple Vitamin (MULTIVITAMIN WITH MINERALS) TABS tablet Take 1 tablet by mouth daily. 07/16/16   Mikhail, Velta Addison, DO  senna (SENOKOT) 8.6 MG TABS tablet Take 1 tablet (8.6 mg total) by mouth daily. 07/13/16   Orson Eva, MD     Allergies:    Allergies  Allergen Reactions  . Fosamax [Alendronate]     Other reaction(s): Unknown  . Vilazodone Hcl     Other reaction(s): nausea  . Quinolones Hives and Rash    87 yr old allergy to quinine    Social History:   Social History   Socioeconomic History  . Marital status: Married    Spouse name: Not on file  . Number of children: Not on file  . Years of education: Not on file  . Highest education level: Not on file  Occupational History  . Not on file  Tobacco Use  . Smoking status: Former Smoker    Packs/day: 0.50    Years: 20.00    Pack years: 10.00    Types: Cigarettes    Start date: 07/12/1973    Quit date: 07/12/1993    Years since quitting: 26.9  . Smokeless tobacco: Former Systems developer    Quit date: 01/21/1962  . Tobacco comment: QUIT 21 YEARS AGP  Substance and Sexual Activity  . Alcohol  use: No    Alcohol/week: 0.0 standard drinks  . Drug use: No  . Sexual activity: Never  Other Topics Concern  . Not on file  Social History Narrative  . Not on file   Social Determinants of Health   Financial Resource Strain: Not on file  Food Insecurity: Not on file  Transportation Needs: Not on file  Physical Activity: Not on file  Stress: Not on file  Social Connections: Not on file  Intimate Partner Violence: Not on file    Social history is that he is married.  He has 2 children a son who is here, and a daughter who he rarely sees.  He  has several grandchildren.  Massie Maroon History: Parents are deceased.  There was no history of MI and family members. The patient's family history is not on file.    ROS:  Please see the history of present illness.  Patient is hard of hearing History of tinnitus Apparent remote history of AF 6 years ago. He denies exertional shortness of breath. Not very active All other ROS reviewed and negative.     Physical Exam/Data:   Vitals:   06/29/20 2200 06/29/20 2215 06/29/20 2230 06/29/20 2245  BP: (!) 163/101 (!) 154/90 (!) 156/103 (!) 158/86  Pulse: 94 (!) 102 92 90  Resp: 11 17 (!) 26 (!) 24  Temp:      TempSrc:      SpO2: 98% 98% 98% 97%  Weight:      Height:        Intake/Output Summary (Last 24 hours) at 06/29/2020 2254 Last data filed at 06/29/2020 2120 Gross per 24 hour  Intake 250 ml  Output --  Net 250 ml   Last 3 Weights 06/29/2020 07/11/2016 07/11/2016  Weight (lbs) 164 lb 14.5 oz 165 lb 160 lb  Weight (kg) 74.8 kg 74.844 kg 72.576 kg     Body mass index is 23.66 kg/m.  General: Young appearing 87 year old gentleman completely pain-free upon arrival to the emergency room HEENT: normal Lymph: no adenopathy Neck: no JVD Endocrine:  No thryomegaly Vascular: No carotid bruits; FA pulses 2+ bilaterally without bruits  Cardiac:  normal S1, S2; RRR; faint 1/6 systolic murmur.  No S3 gallop. Lungs:  clear to auscultation  bilaterally, no wheezing, rhonchi or rales  Abd: soft, nontender, no hepatomegaly  Ext: no edema Musculoskeletal: Mildly kyphotic no deformities, BUE and BLE strength normal and equal Skin: warm and dry  Neuro:  CNs 2-12 intact, no focal abnormalities noted Psych:  Normal affect    EKG: I personally reviewed the initial EMS ECG which showed sinus rhythm at approximately 93 bpm.  Q waves are present V1 through V4 as well as lead III and aVF.  There is 1 to 2 mm ST elevation inferiorly and in lead V4 through V6.  Subsequent ECG reveals sinus tachycardia with mild sinus arrhythmia at approximately 100 bpm.  There is resolution of ST elevation.  Q waves are present V1 through V6 and inferiorly.  Relevant CV Studies: A 2D echo Doppler study from 2016 EF of 60 to 65% with grade 1 diastolic dysfunction, arctic sclerosis  Laboratory Data:  High Sensitivity Troponin:   Recent Labs  Lab 06/29/20 2126  TROPONINIHS 850*      Chemistry Recent Labs  Lab 06/29/20 2126 06/29/20 2138  NA 138 140  K 3.9 3.8  CL 104 104  CO2 25  --   GLUCOSE 111* 106*  BUN 17 19  CREATININE 1.22 1.10  CALCIUM 8.3*  --   GFRNONAA 56*  --   ANIONGAP 9  --     Recent Labs  Lab 06/29/20 2126  PROT 6.2*  ALBUMIN 2.8*  AST 29  ALT 13  ALKPHOS 69  BILITOT 0.5   Hematology Recent Labs  Lab 06/29/20 2126 06/29/20 2138  WBC 5.6  --   RBC 3.75*  --   HGB 12.6* 12.6*  HCT 38.7* 37.0*  MCV 103.2*  --   MCH 33.6  --   MCHC 32.6  --   RDW 14.5  --   PLT 196  --    BNPNo results for input(s): BNP, PROBNP in the  last 168 hours.  DDimer No results for input(s): DDIMER in the last 168 hours.   Radiology/Studies:  No results found.   Assessment and Plan:   1. Acute coronary syndrome: Mr. Angelina Ok is 87 years old.  He developed mild chest pain commencing at 4 PM.  Ultimately, at 8:40 PM EMS was contacted and his initial ECG showed 1 to 2 mm of ST elevation in leads III, aVF and V4 through V6.  A code  STEMI was activated.  He received aspirin nitroglycerin.  Chest pain completely resolved.  Upon arrival to the emergency room he was completely pain-free.  There was resolution of ST elevation.  And he had mild T wave abnormalities inferolaterally.  In the ER the patient received 4000 units of heparin.  With his age of 15 years, chest pain-free and improvement in ECG decision was made to cancel the STEMI activation and initiate medical therapy.  Blood pressure was elevated at 116 he will be started on IV nitroglycerin as well as a heparin drip.  With his heart rate at approximately 100, he will also be started on low-dose beta-blocker therapy.  We will check serial enzymes.  Plan for echo Doppler study in a.m.  The shows Q waves inferiorly and anteriorly but the patient denies any awareness of any prior MI.  2.  Possible history of AF in 2016: Apparently at the time this was not formally documented.  He had seen Dr. Sallyanne Kuster on that occasion. patient denies any recurrence.  He has not been on any medications presently and takes only an intermittent aspirin.  3. Hypertension: The patient has not been on any medications.  4.  Macrocytosis: We will check B12 and folate level  Risk Assessment/Risk Scores:     TIMI Risk Score for ST  Elevation MI:   The patient's TIMI risk score is 6, which indicates a 16.1% risk of all cause mortality at 30 days.        Severity of Illness: The appropriate patient status for this patient is INPATIENT. Inpatient status is judged to be reasonable and necessary in order to provide the required intensity of service to ensure the patient's safety. The patient's presenting symptoms, physical exam findings, and initial radiographic and laboratory data in the context of their chronic comorbidities is felt to place them at high risk for further clinical deterioration. Furthermore, it is not anticipated that the patient will be medically stable for discharge from the hospital  within 2 midnights of admission. The following factors support the patient status of inpatient.   " The patient's presenting symptoms include chest pain " The worrisome physical exam findings include  " The initial radiographic and laboratory data are worrisome because of ECG by EMS showed ST elevation inferolateral " The chronic co-morbidities include age of 57 years.   * I certify that at the point of admission it is my clinical judgment that the patient will require inpatient hospital care spanning beyond 2 midnights from the point of admission due to high intensity of service, high risk for further deterioration and high frequency of surveillance required.*    For questions or updates, please contact [place] Please consult [email or web address] for contact info under     Signed, Shelva Majestic, MD  06/29/2020 10:54 PM

## 2020-06-29 NOTE — ED Provider Notes (Deleted)
Elderly gentleman without known medical history other than arthritis presenting today as a code STEMI with EMS.  Patient reports crushing chest pain that started at 4 PM today and was not resolving despite taking aspirin at home.  He reports he had pain that was not quite as bad a few days ago that resolved after a while.  He denies any exertional symptoms.  When EMS arrived patient was currently having pain and was diaphoretic with blood pressures in the 90s.  EKG showed evidence of ST elevation laterally and he was having intermittent beats of V. tach approximately 5-6 beats that self resolved.  Patient was given 325 of aspirin and also was given 250 bolus of fluid.  He started feeling better after this therapy and upon arrival to the emergency room he reports that his pain is resolved.  Repeat EKG here does show improvement of the elevation inferior laterally but still mildly present.  Cardiology arrived at patient's bedside and at this time wanted to cancel catheterization and continue to follow patient.  Patient was given a heparin bolus and they were planning on admitting the patient to continue to follow him.  Initial troponin is 850, CMP without acute findings and CBC is stable.  MDM Number of Diagnoses or Management Options   Amount and/or Complexity of Data Reviewed Clinical lab tests: ordered and reviewed Tests in the radiology section of CPT: ordered and reviewed Tests in the medicine section of CPT: ordered and reviewed Decide to obtain previous medical records or to obtain history from someone other than the patient: yes Obtain history from someone other than the patient: yes Review and summarize past medical records: yes Discuss the patient with other providers: yes Independent visualization of images, tracings, or specimens: yes  Risk of Complications, Morbidity, and/or Mortality Presenting problems: high Diagnostic procedures: high Management options: high  CRITICAL  CARE Performed by: Okla Qazi Total critical care time: 30 minutes Critical care time was exclusive of separately billable procedures and treating other patients. Critical care was necessary to treat or prevent imminent or life-threatening deterioration. Critical care was time spent personally by me on the following activities: development of treatment plan with patient and/or surrogate as well as nursing, discussions with consultants, evaluation of patient's response to treatment, examination of patient, obtaining history from patient or surrogate, ordering and performing treatments and interventions, ordering and review of laboratory studies, ordering and review of radiographic studies, pulse oximetry and re-evaluation of patient's condition.     Blanchie Dessert, MD 06/29/20 2237

## 2020-06-29 NOTE — Progress Notes (Signed)
ANTICOAGULATION CONSULT NOTE - Initial Consult  Pharmacy Consult for heparin Indication: chest pain/ACS  Allergies  Allergen Reactions  . Quinolones Hives and Rash    87 yr old allergy to quinine    Patient Measurements: Height: 5\' 10"  (177.8 cm) Weight: 74.8 kg (164 lb 14.5 oz) IBW/kg (Calculated) : 73 Heparin Dosing Weight: TBW  Vital Signs: Temp: 97.8 F (36.6 C) (04/16 2122) Temp Source: Temporal (04/16 2122) BP: 168/79 (04/16 2130) Pulse Rate: 99 (04/16 2130)  Labs: Recent Labs    06/29/20 2138  HGB 12.6*  HCT 37.0*  CREATININE 1.10    Estimated Creatinine Clearance: 46.1 mL/min (by C-G formula based on SCr of 1.1 mg/dL).   Medical History: Past Medical History:  Diagnosis Date  . Anxiety   . Arthritis   . Essential hypertension 04/16/2014  . Hard of hearing    tinnitus both ears  . Hypertension   . Paroxysmal atrial fibrillation ([place]) 04/28/2014  . Shoulder pain, left 07/14/2016   states left rotator pain preop    Assessment: 76 YOM presenting as code STEMI, for medical management/observation for now.  Hepairn 4000 units IV given in ED, 324 ASA x 1 PTA.    Goal of Therapy:  Heparin level 0.3-0.7 units/ml Monitor platelets by anticoagulation protocol: Yes   Plan:  Heparin gtt at 900 units/hr F/u heparin level with AM labs Cards plan in AM and LOT  Bertis Ruddy, PharmD Clinical Pharmacist ED Pharmacist Phone # [phone] 06/29/2020 9:45 PM

## 2020-06-29 NOTE — ED Notes (Signed)
Per MD Claiborne Billings, not going to the cath lab at this time

## 2020-06-29 NOTE — ED Notes (Signed)
MD Plunkett notified of critical troponin of 850.

## 2020-06-29 NOTE — ED Triage Notes (Signed)
Patient arrives from home with [facility], patient called with chest pain, pale, cool, diaphoretic on EMS arrival, noted to have ST elevation with runs of vtach, after fluid administration, elevation went away, patient denies any chest pain at this time.

## 2020-06-30 ENCOUNTER — Inpatient Hospital Stay (HOSPITAL_COMMUNITY): Payer: Medicare Other

## 2020-06-30 DIAGNOSIS — I248 Other forms of acute ischemic heart disease: Secondary | ICD-10-CM | POA: Diagnosis not present

## 2020-06-30 LAB — ECHOCARDIOGRAM COMPLETE
AR max vel: 1.14 cm2
AV Area VTI: 1.08 cm2
AV Area mean vel: 1 cm2
AV Mean grad: 8 mmHg
AV Peak grad: 12.5 mmHg
Ao pk vel: 1.77 m/s
Area-P 1/2: 2.62 cm2
Calc EF: 30.2 %
Height: 70 in
MV VTI: 1.66 cm2
P 1/2 time: 538 msec
S' Lateral: 3.8 cm
Single Plane A2C EF: 36.5 %
Single Plane A4C EF: 30.7 %
Weight: 2458.57 oz

## 2020-06-30 LAB — CBC WITH DIFFERENTIAL/PLATELET
Abs Immature Granulocytes: 0.01 10*3/uL (ref 0.00–0.07)
Basophils Absolute: 0 10*3/uL (ref 0.0–0.1)
Basophils Relative: 1 %
Eosinophils Absolute: 0.1 10*3/uL (ref 0.0–0.5)
Eosinophils Relative: 1 %
HCT: 35.5 % — ABNORMAL LOW (ref 39.0–52.0)
Hemoglobin: 11.4 g/dL — ABNORMAL LOW (ref 13.0–17.0)
Immature Granulocytes: 0 %
Lymphocytes Relative: 24 %
Lymphs Abs: 1.3 10*3/uL (ref 0.7–4.0)
MCH: 33.5 pg (ref 26.0–34.0)
MCHC: 32.1 g/dL (ref 30.0–36.0)
MCV: 104.4 fL — ABNORMAL HIGH (ref 80.0–100.0)
Monocytes Absolute: 0.3 10*3/uL (ref 0.1–1.0)
Monocytes Relative: 6 %
Neutro Abs: 3.5 10*3/uL (ref 1.7–7.7)
Neutrophils Relative %: 68 %
Platelets: 184 10*3/uL (ref 150–400)
RBC: 3.4 MIL/uL — ABNORMAL LOW (ref 4.22–5.81)
RDW: 14.6 % (ref 11.5–15.5)
WBC: 5.2 10*3/uL (ref 4.0–10.5)
nRBC: 0 % (ref 0.0–0.2)

## 2020-06-30 LAB — LIPID PANEL
Cholesterol: 152 mg/dL (ref 0–200)
HDL: 42 mg/dL (ref >40)
LDL Cholesterol: 97 mg/dL (ref 0–99)
Total CHOL/HDL Ratio: 3.6 RATIO
Triglycerides: 66 mg/dL (ref <150)
VLDL: 13 mg/dL (ref 0–40)

## 2020-06-30 LAB — COMPREHENSIVE METABOLIC PANEL
ALT: 12 U/L (ref 0–44)
AST: 26 U/L (ref 15–41)
Albumin: 2.6 g/dL — ABNORMAL LOW (ref 3.5–5.0)
Alkaline Phosphatase: 60 U/L (ref 38–126)
Anion gap: 7 (ref 5–15)
BUN: 16 mg/dL (ref 8–23)
CO2: 27 mmol/L (ref 22–32)
Calcium: 8.2 mg/dL — ABNORMAL LOW (ref 8.9–10.3)
Chloride: 104 mmol/L (ref 98–111)
Creatinine, Ser: 1.2 mg/dL (ref 0.61–1.24)
GFR, Estimated: 57 mL/min — ABNORMAL LOW (ref >60)
Glucose, Bld: 147 mg/dL — ABNORMAL HIGH (ref 70–99)
Potassium: 3.8 mmol/L (ref 3.5–5.1)
Sodium: 138 mmol/L (ref 135–145)
Total Bilirubin: 0.4 mg/dL (ref 0.3–1.2)
Total Protein: 5.7 g/dL — ABNORMAL LOW (ref 6.5–8.1)

## 2020-06-30 LAB — CBC
HCT: 36.6 % — ABNORMAL LOW (ref 39.0–52.0)
Hemoglobin: 11.9 g/dL — ABNORMAL LOW (ref 13.0–17.0)
MCH: 33.6 pg (ref 26.0–34.0)
MCHC: 32.5 g/dL (ref 30.0–36.0)
MCV: 103.4 fL — ABNORMAL HIGH (ref 80.0–100.0)
Platelets: 183 10*3/uL (ref 150–400)
RBC: 3.54 MIL/uL — ABNORMAL LOW (ref 4.22–5.81)
RDW: 14.5 % (ref 11.5–15.5)
WBC: 5.2 10*3/uL (ref 4.0–10.5)
nRBC: 0 % (ref 0.0–0.2)

## 2020-06-30 LAB — TROPONIN I (HIGH SENSITIVITY)
Troponin I (High Sensitivity): 842 ng/L (ref <18)
Troponin I (High Sensitivity): 817 ng/L (ref <18)

## 2020-06-30 LAB — HEPARIN LEVEL (UNFRACTIONATED)
Heparin Unfractionated: >1.1 IU/mL — ABNORMAL HIGH (ref 0.30–0.70)
Heparin Unfractionated: 0.28 IU/mL — ABNORMAL LOW (ref 0.30–0.70)

## 2020-06-30 LAB — MRSA PCR SCREENING: MRSA by PCR: NEGATIVE

## 2020-06-30 LAB — TSH: TSH: 0.858 u[IU]/mL (ref 0.350–4.500)

## 2020-06-30 LAB — GLUCOSE, CAPILLARY: Glucose-Capillary: 137 mg/dL — ABNORMAL HIGH (ref 70–99)

## 2020-06-30 MED ORDER — PERFLUTREN LIPID MICROSPHERE
1.0000 mL | INTRAVENOUS | Status: AC | PRN
  Administered 2020-06-30: 3 mL via INTRAVENOUS
  Filled 2020-06-30: qty 10

## 2020-06-30 MED ORDER — METOPROLOL TARTRATE 25 MG PO TABS
25.0000 mg | ORAL_TABLET | Freq: Two times a day (BID) | ORAL | Status: AC
  Administered 2020-06-30 – 2020-07-01 (×4): 25 mg via ORAL
  Filled 2020-06-30 (×4): qty 1

## 2020-06-30 MED ORDER — CHLORHEXIDINE GLUCONATE CLOTH 2 % EX PADS: 6.0000 | MEDICATED_PAD | Freq: Every day | CUTANEOUS | Status: DC

## 2020-06-30 MED ORDER — LISINOPRIL 5 MG PO TABS
5.0000 mg | ORAL_TABLET | Freq: Every day | ORAL | Status: DC
  Administered 2020-06-30 – 2020-07-02 (×3): 5 mg via ORAL
  Filled 2020-06-30 (×3): qty 1

## 2020-06-30 MED ORDER — ISOSORBIDE MONONITRATE ER 60 MG PO TB24
60.0000 mg | ORAL_TABLET | Freq: Every day | ORAL | Status: DC
  Administered 2020-06-30 – 2020-07-02 (×3): 60 mg via ORAL
  Filled 2020-06-30 (×3): qty 1

## 2020-06-30 MED ORDER — MELATONIN 3 MG PO TABS
3.0000 mg | ORAL_TABLET | Freq: Every day | ORAL | Status: DC
  Administered 2020-06-30 – 2020-07-01 (×3): 3 mg via ORAL
  Filled 2020-06-30 (×3): qty 1

## 2020-06-30 NOTE — Progress Notes (Signed)
Progress Note  Patient Name: Tom Ortiz Date of Encounter: 06/30/2020  [facility] Cardiologist: Shelva Majestic, MD (New)  Subjective   Pt denies CP now   Can't remember fully what happened yesterday, about CP    Breathing is OK      Inpatient Medications    Scheduled Meds: . aspirin  324 mg Oral NOW   Or  . aspirin  300 mg Rectal NOW  . aspirin EC  81 mg Oral Daily  . atorvastatin  40 mg Oral Daily  . Chlorhexidine Gluconate Cloth  6 each Topical Q0600  . melatonin  3 mg Oral QHS  . metoprolol tartrate  12.5 mg Oral BID   Continuous Infusions: . sodium chloride Stopped (06/29/20 2220)  . heparin 900 Units/hr (06/30/20 0600)  . nitroGLYCERIN 5 mcg/min (06/30/20 0816)   PRN Meds: acetaminophen, nitroGLYCERIN, ondansetron (ZOFRAN) IV   Vital Signs    Vitals:   06/30/20 0802 06/30/20 0822 06/30/20 0825 06/30/20 0835  BP: (!) 170/92 (!) 159/85 (!) 158/88 (!) 145/79  Pulse: 68 67 75 69  Resp: (!) 22 (!) 21 18 18   Temp:      TempSrc:      SpO2: 100% 100% 100% 100%  Weight:      Height:        Intake/Output Summary (Last 24 hours) at 06/30/2020 0841 Last data filed at 06/30/2020 0600 Gross per 24 hour  Intake 345.67 ml  Output 150 ml  Net 195.67 ml   Last 3 Weights 06/30/2020 06/30/2020 06/29/2020  Weight (lbs) 153 lb 10.6 oz 153 lb 10.6 oz 164 lb 14.5 oz  Weight (kg) 69.7 kg 69.7 kg 74.8 kg      Telemetry    SR with PACs - Personally Reviewed  ECG    No new  Personally Reviewed  Physical Exam   GEN: No acute distress.   Neck: No JVD Cardiac: RRR, no murmurs  No rubs  Respiratory: Clear to auscultation bilaterally. GI: Soft, nontender, non-distended  MS: No edema; No deformity. Neuro:  Nonfocal  Psych: Normal affect   Labs    High Sensitivity Troponin:   Recent Labs  Lab 06/29/20 2126 06/30/20 0010 06/30/20 0100  TROPONINIHS 850* 842* 817*      Chemistry Recent Labs  Lab 06/29/20 2126 06/29/20 2138 06/30/20 0010  NA 138 140  138  K 3.9 3.8 3.8  CL 104 104 104  CO2 25  --  27  GLUCOSE 111* 106* 147*  BUN 17 19 16   CREATININE 1.22 1.10 1.20  CALCIUM 8.3*  --  8.2*  PROT 6.2*  --  5.7*  ALBUMIN 2.8*  --  2.6*  AST 29  --  26  ALT 13  --  12  ALKPHOS 69  --  60  BILITOT 0.5  --  0.4  GFRNONAA 56*  --  57*  ANIONGAP 9  --  7     Hematology Recent Labs  Lab 06/29/20 2126 06/29/20 2138 06/30/20 0010 06/30/20 0100  WBC 5.6  --  5.2 5.2  RBC 3.75*  --  3.40* 3.54*  HGB 12.6* 12.6* 11.4* 11.9*  HCT 38.7* 37.0* 35.5* 36.6*  MCV 103.2*  --  104.4* 103.4*  MCH 33.6  --  33.5 33.6  MCHC 32.6  --  32.1 32.5  RDW 14.5  --  14.6 14.5  PLT 196  --  184 183    BNPNo results for input(s): BNP, PROBNP in the last 168 hours.  DDimer No results for input(s): DDIMER in the last 168 hours.   Radiology    No results found.  Cardiac Studies   Echo ordered   Patient Profile     87 y.o. male with hx of HTN who presented last night with CP, EKG changes     Assessment & Plan    1  CAD Pt is a poor historian   Had CP yesterday on and off   At 8: 30 EMS called  On EMS arrival EKG showed  Q waves with ST elevation inferiorly and 1 mm ST elevation V4 to V6  Note Q waves in V1-V4.   (Q waves new from EKG in 2016)  Given NTG SL with resolution of symptoms and near near resolution of EKG changes    The pt can't remember having CP prior    He says he has occasional dizziness Currently comfortable      Peak trop 837  EKG suggests patient has had remote events (R waves present in 2016 EKG, small inferiorly,but present)  Plan:  Repeat EKG Echo ordered Continue heparin and NTG for now   Increase b blocker to 25 bid Add low dose ACE I   Son should visit today   WIll review  Need t odiscuss overall goals of care    2  HTN    BP is not controlled  Titrate  Add ACE I    3  Lipids   LDL 97   HDL 42   Trig 66  On LIpitor 40    4  Neuro  Pt appears to have some dementia   Will reivew with son.  Rhythm   Pt with  question of   Atrial fibrillation   NEVER documented   Back in 2016   Never documented.  For questions or updates, please contact [place] Please consult [email or web address] for contact info under        Signed, Dorris Carnes, MD  06/30/2020, 8:41 AM

## 2020-06-30 NOTE — Plan of Care (Signed)

## 2020-06-30 NOTE — Progress Notes (Signed)
Reviewed echo images (prelimary) LV systolic function is abnormal with akinesis of the infeiror/inferoseptal walls and severe hypokinesis/akeinsis of the apex.   ? Thrombus at apex.  (Definity ordered) RVEF is normal AV is thickened  WIll review complete exam when completed   I have discussed findings with patient's wife and son    The patient's mental status is limited.  The event yesterday of CP with rel mild bump in troponin does not explain the changes on the patients EKG or Echo.   He had infarct in past.  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   Not clear if anything would significantly improve his clinical status.  Given his age and other medical conditions the family would favor a trial of medical therapy for BP and HR control and not an invasive approach.     This is very reasonable    Will work with this and follow BP, HR and symptoms  Will work to transfer to step down and slowly increase activity   Keep on heparin for now     Dorris Carnes MD

## 2020-06-30 NOTE — Progress Notes (Signed)
Denies chest pain.VSS.Will continue to monitor.

## 2020-06-30 NOTE — Progress Notes (Signed)
ANTICOAGULATION CONSULT NOTE - Follow Up  Consult  Pharmacy Consult for heparin Indication: chest pain/ACS  Allergies  Allergen Reactions  . Fosamax [Alendronate]     Other reaction(s): Unknown  . Vilazodone Hcl     Other reaction(s): nausea  . Quinolones Hives and Rash    87 yr old allergy to quinine    Patient Measurements: Height: 5\' 10"  (177.8 cm) Weight: 69.7 kg (153 lb 10.6 oz) IBW/kg (Calculated) : 73 Heparin Dosing Weight: TBW  Vital Signs: Temp: 97.9 F (36.6 C) (04/17 1100) Temp Source: Oral (04/17 0400) BP: 144/93 (04/17 1004) Pulse Rate: 83 (04/17 1004)  Labs: Recent Labs    06/29/20 2126 06/29/20 2138 06/30/20 0010 06/30/20 0100 06/30/20 0515 06/30/20 0631  HGB 12.6* 12.6* 11.4* 11.9*  --   --   HCT 38.7* 37.0* 35.5* 36.6*  --   --   PLT 196  --  184 183  --   --   APTT 30  --   --   --   --   --   LABPROT 15.4*  --   --   --   --   --   INR 1.2  --   --   --   --   --   HEPARINUNFRC  --   --   --   --  >1.10* 0.28*  CREATININE 1.22 1.10 1.20  --   --   --   TROPONINIHS 850*  --  842* 817*  --   --     Estimated Creatinine Clearance: 40.3 mL/min (by C-G formula based on SCr of 1.2 mg/dL).   Medical History: Past Medical History:  Diagnosis Date  . Anxiety   . Arthritis   . Essential hypertension 04/16/2014  . Hard of hearing    tinnitus both ears  . Hypertension   . Paroxysmal atrial fibrillation ([place]) 04/28/2014  . Shoulder pain, left 07/14/2016   states left rotator pain preop    Assessment: 87 YOM presenting as code STEMI, for medical management/observation for now.  Hepairn 4000 units IV given in ED, 324 ASA x 1 PTA.    Heparin drip 900 uts/hr Heparin level 0.28 slightly less than goal  Cbc stable, no chest pain SR BP stable  Asa, atorv, metop lisin NTG drip   Goal of Therapy:  Heparin level 0.3-0.7 units/ml Monitor platelets by anticoagulation protocol: Yes   Plan:  Continue Heparin gtt at 900 units/hr Daily heparin level  and CBC   Bonnita Nasuti Pharm.D. CPP, BCPS Clinical Pharmacist [phone] 06/30/2020 12:58 PM

## 2020-06-30 NOTE — Progress Notes (Signed)
  Echocardiogram 2D Echocardiogram has been performed.  Tom Ortiz 06/30/2020, 2:01 PM

## 2020-07-01 DIAGNOSIS — I214 Non-ST elevation (NSTEMI) myocardial infarction: Secondary | ICD-10-CM | POA: Diagnosis not present

## 2020-07-01 DIAGNOSIS — I480 Paroxysmal atrial fibrillation: Secondary | ICD-10-CM | POA: Diagnosis not present

## 2020-07-01 DIAGNOSIS — I5021 Acute systolic (congestive) heart failure: Secondary | ICD-10-CM

## 2020-07-01 LAB — HEPARIN LEVEL (UNFRACTIONATED)
Heparin Unfractionated: 0.19 IU/mL — ABNORMAL LOW (ref 0.30–0.70)
Heparin Unfractionated: 0.37 IU/mL (ref 0.30–0.70)

## 2020-07-01 LAB — BASIC METABOLIC PANEL
Anion gap: 5 (ref 5–15)
BUN: 15 mg/dL (ref 8–23)
CO2: 23 mmol/L (ref 22–32)
Calcium: 8 mg/dL — ABNORMAL LOW (ref 8.9–10.3)
Chloride: 107 mmol/L (ref 98–111)
Creatinine, Ser: 1.09 mg/dL (ref 0.61–1.24)
GFR, Estimated: >60 mL/min (ref >60)
Glucose, Bld: 88 mg/dL (ref 70–99)
Potassium: 3.7 mmol/L (ref 3.5–5.1)
Sodium: 135 mmol/L (ref 135–145)

## 2020-07-01 LAB — CBC
HCT: 33.7 % — ABNORMAL LOW (ref 39.0–52.0)
Hemoglobin: 10.9 g/dL — ABNORMAL LOW (ref 13.0–17.0)
MCH: 34 pg (ref 26.0–34.0)
MCHC: 32.3 g/dL (ref 30.0–36.0)
MCV: 105 fL — ABNORMAL HIGH (ref 80.0–100.0)
Platelets: 179 10*3/uL (ref 150–400)
RBC: 3.21 MIL/uL — ABNORMAL LOW (ref 4.22–5.81)
RDW: 14.5 % (ref 11.5–15.5)
WBC: 5.4 10*3/uL (ref 4.0–10.5)
nRBC: 0 % (ref 0.0–0.2)

## 2020-07-01 LAB — MAGNESIUM: Magnesium: 1.9 mg/dL (ref 1.7–2.4)

## 2020-07-01 LAB — HEMOGLOBIN A1C
Hgb A1c MFr Bld: 5.7 % — ABNORMAL HIGH (ref 4.8–5.6)
Mean Plasma Glucose: 117 mg/dL

## 2020-07-01 MED ORDER — METOPROLOL SUCCINATE ER 50 MG PO TB24
50.0000 mg | ORAL_TABLET | Freq: Every day | ORAL | Status: DC
  Administered 2020-07-02: 50 mg via ORAL
  Filled 2020-07-01: qty 1

## 2020-07-01 NOTE — Progress Notes (Signed)
Progress Note  Patient Name: Tom Ortiz Date of Encounter: 07/01/2020  [facility] Cardiologist: Shelva Majestic, MD   Subjective   No chest pain overnight. Sitting up in the bed. Resting until woken. Homesick, misses his wife and dog.   Inpatient Medications    Scheduled Meds: . aspirin EC  81 mg Oral Daily  . atorvastatin  40 mg Oral Daily  . isosorbide mononitrate  60 mg Oral Daily  . lisinopril  5 mg Oral Daily  . melatonin  3 mg Oral QHS  . [START ON 07/02/2020] metoprolol succinate  50 mg Oral Daily  . metoprolol tartrate  25 mg Oral BID   Continuous Infusions: . sodium chloride Stopped (06/29/20 2220)  . heparin 1,100 Units/hr (07/01/20 0403)   PRN Meds: acetaminophen, nitroGLYCERIN, ondansetron (ZOFRAN) IV   Vital Signs    Vitals:   07/01/20 0235 07/01/20 0426 07/01/20 0610 07/01/20 0803  BP: 132/81 (!) 127/97 122/86 135/88  Pulse: 64 100  78  Resp: 20 13  20   Temp: 98.2 F (36.8 C) 98.1 F (36.7 C)  97.6 F (36.4 C)  TempSrc: Oral Oral  Oral  SpO2: 94% 98%  100%  Weight:      Height:        Intake/Output Summary (Last 24 hours) at 07/01/2020 0928 Last data filed at 07/01/2020 0428 Gross per 24 hour  Intake 324.87 ml  Output 1300 ml  Net -975.13 ml   Last 3 Weights 06/30/2020 06/30/2020 06/29/2020  Weight (lbs) 153 lb 10.6 oz 153 lb 10.6 oz 164 lb 14.5 oz  Weight (kg) 69.7 kg 69.7 kg 74.8 kg      Telemetry    Intermittent episodes afib rates in the 160-170 ranges, along with what appears to be aberrancy--> sinus rhythm and PACs - Personally Reviewed  ECG    NSR, 75bpm, inferior/anteroseptal infarct - Personally Reviewed  Physical Exam  Frail older male, sitting up in bed.  GEN: No acute distress.   Neck: No JVD Cardiac: RRR, no murmurs, rubs, or gallops.  Respiratory: Clear to auscultation bilaterally. GI: Soft, nontender, non-distended  MS: No edema; No deformity. Neuro:  Nonfocal  Psych: Normal affect   Labs    High  Sensitivity Troponin:   Recent Labs  Lab 06/29/20 2126 06/30/20 0010 06/30/20 0100  TROPONINIHS 850* 842* 817*      Chemistry Recent Labs  Lab 06/29/20 2126 06/29/20 2138 06/30/20 0010 07/01/20 0244  NA 138 140 138 135  K 3.9 3.8 3.8 3.7  CL 104 104 104 107  CO2 25  --  27 23  GLUCOSE 111* 106* 147* 88  BUN 17 19 16 15   CREATININE 1.22 1.10 1.20 1.09  CALCIUM 8.3*  --  8.2* 8.0*  PROT 6.2*  --  5.7*  --   ALBUMIN 2.8*  --  2.6*  --   AST 29  --  26  --   ALT 13  --  12  --   ALKPHOS 69  --  60  --   BILITOT 0.5  --  0.4  --   GFRNONAA 56*  --  57* >60  ANIONGAP 9  --  7 5     Hematology Recent Labs  Lab 06/30/20 0010 06/30/20 0100 07/01/20 0244  WBC 5.2 5.2 5.4  RBC 3.40* 3.54* 3.21*  HGB 11.4* 11.9* 10.9*  HCT 35.5* 36.6* 33.7*  MCV 104.4* 103.4* 105.0*  MCH 33.5 33.6 34.0  MCHC 32.1 32.5 32.3  RDW  14.6 14.5 14.5  PLT 184 183 179    BNPNo results for input(s): BNP, PROBNP in the last 168 hours.   DDimer No results for input(s): DDIMER in the last 168 hours.   Radiology    ECHOCARDIOGRAM COMPLETE  Result Date: 06/30/2020    ECHOCARDIOGRAM REPORT   Patient Name:   Tom Ortiz Date of Exam: 06/30/2020 Medical Rec #:  [id number]        Height:       70.0 in Accession #:    [id number]       Weight:       153.7 lb Date of Birth:  1927-05-24         BSA:          1.866 m Patient Age:    87 years         BP:           112/69 mmHg Patient Gender: M                HR:           45 bpm. Exam Location:  Inpatient Procedure: 2D Echo, Cardiac Doppler, Color Doppler and 3D Echo Indications:    Acute ischemic heart disease, unspecified  History:        Patient has prior history of Echocardiogram examinations, most                 recent 04/19/2014. CAD; Risk Factors:Hypertension and                 Dyslipidemia.  Sonographer:    Clayton Lefort RDCS (AE) Referring Phys: [place] Comments: Suboptimal subcostal window. IMPRESSIONS  1. No LV thrombus seen  on contrasted images. Left ventricular ejection fraction, by estimation, is 30 to 35%. The left ventricle has moderately decreased function. The left ventricle demonstrates regional wall motion abnormalities: global hypokinesis with inferior and inferoseptal akinesis. Left ventricular diastolic parameters are indeterminate.  2. Left atrial size was mildly dilated.  3. The mitral valve is normal in structure. Trivial mitral valve regurgitation.  4. The aortic valve is calcified. Aortic valve regurgitation is mild. No aortic stenosis is present.  5. There is borderline dilatation of the aortic root, measuring 38 mm. There is borderline dilatation of the ascending aorta, measuring 37 mm.  6. Right ventricular systolic function is normal. The right ventricular size is normal. Comparison(s): A prior study was performed on 04/27/2014. New decrease in LVEF and WMA's. Primary cardiologist aware. FINDINGS  Left Ventricle: No LV thrombus seen on contrasted images. Left ventricular ejection fraction, by estimation, is 30 to 35%. Left ventricular ejection fraction by 3D volume is 38 %. The left ventricle has moderately decreased function. The left ventricle demonstrates regional wall motion abnormalities. The left ventricular internal cavity size was normal in size. There is no left ventricular hypertrophy of the basal-septal segment. Left ventricular diastolic parameters are indeterminate.  LV Wall Scoring: The mid and distal inferior wall, mid inferoseptal segment, and apical septal segment are akinetic. The mid and distal anterior wall, mid and distal lateral wall, mid anteroseptal segment, mid anterolateral segment, and apex are hypokinetic. The basal anteroseptal segment is normal. Right Ventricle: The right ventricular size is normal. No increase in right ventricular wall thickness. Right ventricular systolic function is normal. Left Atrium: Left atrial size was mildly dilated. Right Atrium: Right atrial size was normal in  size. Pericardium: There is no evidence of pericardial effusion. Mitral Valve:  The mitral valve is normal in structure. Trivial mitral valve regurgitation. MV peak gradient, 2.0 mmHg. The mean mitral valve gradient is 1.0 mmHg. Tricuspid Valve: The tricuspid valve is grossly normal. Tricuspid valve regurgitation is not demonstrated. Aortic Valve: The aortic valve is calcified. Aortic valve regurgitation is mild. Aortic regurgitation PHT measures 538 msec. No aortic stenosis is present. Aortic valve mean gradient measures 8.0 mmHg. Aortic valve peak gradient measures 12.5 mmHg. Aortic valve area, by VTI measures 1.08 cm. Pulmonic Valve: The pulmonic valve was not well visualized. Pulmonic valve regurgitation is not visualized. No evidence of pulmonic stenosis. Aorta: The ascending aorta was not well visualized. There is borderline dilatation of the aortic root, measuring 38 mm. There is borderline dilatation of the ascending aorta, measuring 37 mm. IAS/Shunts: The atrial septum is grossly normal.  LEFT VENTRICLE PLAX 2D LVIDd:         4.50 cm         Diastology LVIDs:         3.80 cm         LV e' medial:    4.79 cm/s LV PW:         1.90 cm         LV E/e' medial:  12.3 LV IVS:        2.10 cm         LV e' lateral:   3.81 cm/s LVOT diam:     2.00 cm         LV E/e' lateral: 15.4 LV SV:         43 LV SV Index:   23 LVOT Area:     3.14 cm        3D Volume EF                                LV 3D EF:    Left                                             ventricular LV Volumes (MOD)                            ejection LV vol d, MOD    127.0 ml                   fraction by A2C:                                        3D volume LV vol d, MOD    140.0 ml                   is 38 %. A4C: LV vol s, MOD    80.7 ml A2C:                           3D Volume EF: LV vol s, MOD    97.0 ml       3D EF:        38 % A4C:  LV EDV:       176 ml LV SV MOD A2C:   46.3 ml       LV ESV:       110 ml LV SV MOD A4C:    140.0 ml      LV SV:        66 ml LV SV MOD BP:    40.4 ml RIGHT VENTRICLE RV Basal diam:  2.90 cm RV S prime:     9.90 cm/s TAPSE (M-mode): 2.5 cm LEFT ATRIUM             Index       RIGHT ATRIUM           Index LA diam:        3.50 cm 1.88 cm/m  RA Area:     13.20 cm LA Vol (A2C):   50.2 ml 26.90 ml/m RA Volume:   29.20 ml  15.65 ml/m LA Vol (A4C):   70.0 ml 37.51 ml/m LA Biplane Vol: 64.2 ml 34.40 ml/m  AORTIC VALVE AV Area (Vmax):    1.14 cm AV Area (Vmean):   1.00 cm AV Area (VTI):     1.08 cm AV Vmax:           176.50 cm/s AV Vmean:          136.250 cm/s AV VTI:            0.395 m AV Peak Grad:      12.5 mmHg AV Mean Grad:      8.0 mmHg LVOT Vmax:         63.80 cm/s LVOT Vmean:        43.300 cm/s LVOT VTI:          0.136 m LVOT/AV VTI ratio: 0.34 AI PHT:            538 msec  AORTA Ao Root diam: 3.80 cm Ao Asc diam:  3.70 cm MITRAL VALVE               TRICUSPID VALVE MV Area (PHT): 2.62 cm    TR Peak grad:   14.3 mmHg MV Area VTI:   1.66 cm    TR Vmax:        189.00 cm/s MV Peak grad:  2.0 mmHg MV Mean grad:  1.0 mmHg    SHUNTS MV Vmax:       0.72 m/s    Systemic VTI:  0.14 m MV Vmean:      42.2 cm/s   Systemic Diam: 2.00 cm MV Decel Time: 290 msec MV E velocity: 58.80 cm/s MV A velocity: 57.50 cm/s MV E/A ratio:  1.02 Rudean Haskell MD Electronically signed by Rudean Haskell MD Signature Date/Time: 06/30/2020/6:56:36 PM    Final     Cardiac Studies   Echo: 06/30/20  IMPRESSIONS    1. No LV thrombus seen on contrasted images. Left ventricular ejection  fraction, by estimation, is 30 to 35%. The left ventricle has moderately  decreased function. The left ventricle demonstrates regional wall motion  abnormalities: global hypokinesis  with inferior and inferoseptal akinesis. Left ventricular diastolic  parameters are indeterminate.  2. Left atrial size was mildly dilated.  3. The mitral valve is normal in structure. Trivial mitral valve  regurgitation.  4. The aortic  valve is calcified. Aortic valve regurgitation is mild. No  aortic stenosis is present.  5. There is borderline dilatation of the aortic root, measuring 38 mm.  There is borderline  dilatation of the ascending aorta, measuring 37 mm.  6. Right ventricular systolic function is normal. The right ventricular  size is normal.   Comparison(s): A prior study was performed on 04/27/2014. New decrease in  LVEF and WMA's. Primary cardiologist aware.   FINDINGS  Left Ventricle: No LV thrombus seen on contrasted images. Left  ventricular ejection fraction, by estimation, is 30 to 35%. Left  ventricular ejection fraction by 3D volume is 38 %. The left ventricle has  moderately decreased function. The left ventricle  demonstrates regional wall motion abnormalities. The left ventricular  internal cavity size was normal in size. There is no left ventricular  hypertrophy of the basal-septal segment. Left ventricular diastolic  parameters are indeterminate.   Patient Profile     87 y.o. male with PMH of HTN who presented with chest pain and found to have EKG changes. Initially called a STEMI but resolution of ST changes while in the ED and cancelled. Started on medical therapy.   Assessment & Plan    1. NSTEMI with initial presentation of ST elevation: initial EKG showed Q waves in inferior leads and 1 mm ST elevation in v4-v6. Had resolution of ST changes while in the ED and decision made to treat medically. hsTn peaked at 837. Echo noted an EF of 30-35% with global hypokinesis, inferoseptal akinesis. Findings were discussed with wife and son yesterday with Dr. Harrington Challenger, given his advanced age and frail state, plans to continue with medical therapy.  -- has been treated with IV heparin, will plan to stop this evening -- continue ASA, statin, metoprolol, lisinopril   2. Acute systolic CHF: in the setting of MI, presumed to be ischemia. EF noted at 30-35%. No signs of volume overload on exam -- will  transition to Toprol 50mg  daily, continue lisinopril. Suspect he may not be able to tolerate Entresto given frail state  3. HLD: LDL 97 -- on atorvastatin 40mg  daily  4. PAF with suspected aberrancy, RVR: notes indicate a possible hx of the same. Now noted on telemetry in the setting of MI. Rates are elevated in the 160s at times, but self terminating.  -- TSH WNL, K+ 3.7 -- continue BB -- currently on IV heparin, would not consider him a good candidate for [facility] with his advanced age, anemia and frail state. Plan to DC heparin later this afternoon.     For questions or updates, please contact [place] Please consult [email or web address] for contact info under        Signed, Reino Bellis, NP  07/01/2020, 9:28 AM

## 2020-07-01 NOTE — Progress Notes (Signed)
Pt had about a minutes worth of 170-180 svt or rapid Afib, had pt do valsalva's maneuver pt came back into NSR with PVC's. Pt did not realize he was having any change.  Dr Vickki Muff paged no new orders given

## 2020-07-01 NOTE — Progress Notes (Signed)
ANTICOAGULATION CONSULT NOTE - Follow Up  Consult  Pharmacy Consult for heparin Indication: chest pain/ACS  Allergies  Allergen Reactions  . Fosamax [Alendronate]     Other reaction(s): Unknown  . Vilazodone Hcl     Other reaction(s): nausea  . Quinolones Hives and Rash    87 yr old allergy to quinine    Patient Measurements: Height: 5\' 10"  (177.8 cm) Weight: 69.7 kg (153 lb 10.6 oz) IBW/kg (Calculated) : 73 Heparin Dosing Weight: TBW  Vital Signs: Temp: 97.6 F (36.4 C) (04/18 0803) Temp Source: Oral (04/18 0803) BP: 135/88 (04/18 0803) Pulse Rate: 78 (04/18 0803)  Labs: Recent Labs    06/29/20 2126 06/29/20 2138 06/30/20 0010 06/30/20 0100 06/30/20 0515 06/30/20 0631 07/01/20 0244 07/01/20 1151  HGB 12.6* 12.6* 11.4* 11.9*  --   --  10.9*  --   HCT 38.7* 37.0* 35.5* 36.6*  --   --  33.7*  --   PLT 196  --  184 183  --   --  179  --   APTT 30  --   --   --   --   --   --   --   LABPROT 15.4*  --   --   --   --   --   --   --   INR 1.2  --   --   --   --   --   --   --   HEPARINUNFRC  --   --   --   --    < > 0.28* 0.19* 0.37  CREATININE 1.22 1.10 1.20  --   --   --  1.09  --   TROPONINIHS 850*  --  842* 817*  --   --   --   --    < > = values in this interval not displayed.    Estimated Creatinine Clearance: 44.4 mL/min (by C-G formula based on SCr of 1.09 mg/dL).    Assessment: 87 YOM presenting as code STEMI, for medical management. Pharmacy dosing heparin. Plans to discontinue heparin at 10pm tonight -heparin level at goal   Goal of Therapy:  Heparin level 0.3-0.7 units/ml Monitor platelets by anticoagulation protocol: Yes   Plan:  -Continue heparin at 110 units/hr -Heparin to stop at [place], PharmD Clinical Pharmacist **Pharmacist phone directory can now be found on [email or web address] ([email or web address]).  Listed under [place].

## 2020-07-01 NOTE — Progress Notes (Signed)
Pt went into Afib AVR for 20-30 min's as we was setting pt up for 12 lead EKG, pt converted back into NSR with first degree HB

## 2020-07-01 NOTE — Progress Notes (Signed)
ANTICOAGULATION CONSULT NOTE - Follow Up  Consult  Pharmacy Consult for heparin Indication: chest pain/ACS  Allergies  Allergen Reactions  . Fosamax [Alendronate]     Other reaction(s): Unknown  . Vilazodone Hcl     Other reaction(s): nausea  . Quinolones Hives and Rash    87 yr old allergy to quinine    Patient Measurements: Height: 5\' 10"  (177.8 cm) Weight: 69.7 kg (153 lb 10.6 oz) IBW/kg (Calculated) : 73 Heparin Dosing Weight: TBW  Vital Signs: Temp: 98.2 F (36.8 C) (04/18 0235) Temp Source: Oral (04/18 0235) BP: 132/81 (04/18 0235) Pulse Rate: 64 (04/18 0235)  Labs: Recent Labs    06/29/20 2126 06/29/20 2138 06/30/20 0010 06/30/20 0100 06/30/20 0515 06/30/20 0631 07/01/20 0244  HGB 12.6* 12.6* 11.4* 11.9*  --   --  10.9*  HCT 38.7* 37.0* 35.5* 36.6*  --   --  33.7*  PLT 196  --  184 183  --   --  179  APTT 30  --   --   --   --   --   --   LABPROT 15.4*  --   --   --   --   --   --   INR 1.2  --   --   --   --   --   --   HEPARINUNFRC  --   --   --   --  >1.10* 0.28* 0.19*  CREATININE 1.22 1.10 1.20  --   --   --  1.09  TROPONINIHS 850*  --  842* 817*  --   --   --     Estimated Creatinine Clearance: 44.4 mL/min (by C-G formula based on SCr of 1.09 mg/dL).    Assessment: 79 YOM presenting as code STEMI, for medical management/observation for now.  Hepairn 4000 units IV given in ED, 324 ASA x 1 PTA.    Heparin level subtherapeutic (0.19) on gtt at 900 units/hr. No issues with line or bleeding reported per RN.  Goal of Therapy:  Heparin level 0.3-0.7 units/ml Monitor platelets by anticoagulation protocol: Yes   Plan:  Increase heparin gtt to 1100 units/hr Will f/u 8hr heparin level  Sherlon Handing, PharmD, BCPS Please see [email or web address] for complete clinical pharmacist phone list 07/01/2020 3:56 AM

## 2020-07-02 ENCOUNTER — Other Ambulatory Visit (HOSPITAL_COMMUNITY): Payer: Self-pay

## 2020-07-02 DIAGNOSIS — I10 Essential (primary) hypertension: Secondary | ICD-10-CM | POA: Diagnosis not present

## 2020-07-02 DIAGNOSIS — E785 Hyperlipidemia, unspecified: Secondary | ICD-10-CM

## 2020-07-02 DIAGNOSIS — D649 Anemia, unspecified: Secondary | ICD-10-CM

## 2020-07-02 DIAGNOSIS — I214 Non-ST elevation (NSTEMI) myocardial infarction: Secondary | ICD-10-CM | POA: Diagnosis not present

## 2020-07-02 DIAGNOSIS — I249 Acute ischemic heart disease, unspecified: Secondary | ICD-10-CM | POA: Diagnosis not present

## 2020-07-02 DIAGNOSIS — I480 Paroxysmal atrial fibrillation: Secondary | ICD-10-CM | POA: Diagnosis not present

## 2020-07-02 LAB — BASIC METABOLIC PANEL
Anion gap: 5 (ref 5–15)
BUN: 17 mg/dL (ref 8–23)
CO2: 24 mmol/L (ref 22–32)
Calcium: 8.1 mg/dL — ABNORMAL LOW (ref 8.9–10.3)
Chloride: 108 mmol/L (ref 98–111)
Creatinine, Ser: 1.19 mg/dL (ref 0.61–1.24)
GFR, Estimated: 58 mL/min — ABNORMAL LOW (ref >60)
Glucose, Bld: 86 mg/dL (ref 70–99)
Potassium: 4 mmol/L (ref 3.5–5.1)
Sodium: 137 mmol/L (ref 135–145)

## 2020-07-02 LAB — CBC
HCT: 33.1 % — ABNORMAL LOW (ref 39.0–52.0)
Hemoglobin: 10.6 g/dL — ABNORMAL LOW (ref 13.0–17.0)
MCH: 33.2 pg (ref 26.0–34.0)
MCHC: 32 g/dL (ref 30.0–36.0)
MCV: 103.8 fL — ABNORMAL HIGH (ref 80.0–100.0)
Platelets: 204 10*3/uL (ref 150–400)
RBC: 3.19 MIL/uL — ABNORMAL LOW (ref 4.22–5.81)
RDW: 14.8 % (ref 11.5–15.5)
WBC: 4.9 10*3/uL (ref 4.0–10.5)
nRBC: 0 % (ref 0.0–0.2)

## 2020-07-02 MED ORDER — ATORVASTATIN CALCIUM 40 MG PO TABS
40.0000 mg | ORAL_TABLET | Freq: Every day | ORAL | 3 refills | Status: DC
  Filled 2020-07-02: qty 30, 30d supply, fill #0

## 2020-07-02 MED ORDER — DOCUSATE SODIUM 100 MG PO CAPS: 100.0000 mg | ORAL_CAPSULE | Freq: Every day | ORAL | Status: DC | PRN

## 2020-07-02 MED ORDER — METOPROLOL SUCCINATE ER 50 MG PO TB24
50.0000 mg | ORAL_TABLET | Freq: Every day | ORAL | 11 refills | Status: DC
  Filled 2020-07-02: qty 30, 30d supply, fill #0

## 2020-07-02 MED ORDER — ISOSORBIDE MONONITRATE ER 60 MG PO TB24
60.0000 mg | ORAL_TABLET | Freq: Every day | ORAL | 11 refills | Status: DC
  Filled 2020-07-02: qty 30, 30d supply, fill #0

## 2020-07-02 MED ORDER — NITROGLYCERIN 0.4 MG SL SUBL
0.4000 mg | SUBLINGUAL_TABLET | SUBLINGUAL | 1 refills | Status: DC | PRN
  Filled 2020-07-02: qty 25, 1d supply, fill #0

## 2020-07-02 MED ORDER — ASPIRIN 81 MG PO TBEC
81.0000 mg | DELAYED_RELEASE_TABLET | Freq: Every day | ORAL | 11 refills | Status: DC
  Filled 2020-07-02: qty 30, 30d supply, fill #0

## 2020-07-02 MED ORDER — LISINOPRIL 5 MG PO TABS
5.0000 mg | ORAL_TABLET | Freq: Every day | ORAL | 11 refills | Status: DC
  Filled 2020-07-02: qty 30, 30d supply, fill #0

## 2020-07-02 MED ORDER — ENSURE ENLIVE PO LIQD: 237.0000 mL | Freq: Every day | ORAL | Status: DC | PRN

## 2020-07-02 NOTE — Progress Notes (Signed)
Patient d/c from unit. Discharge instructions given and education completed.

## 2020-07-02 NOTE — Discharge Summary (Signed)
Discharge Summary    Patient ID: Tom Ortiz MRN: [id number]; DOB: [birth year]  Admit date: 06/29/2020 Discharge date: 07/02/2020  PCP:  Kathyrn Lass   [place]  Cardiologist:  Shelva Majestic, MD  Advanced Practice Provider:  No care team member to display Electrophysiologist:  None    Discharge Diagnoses    Principal Problem:   ACS (acute coronary syndrome) [facility]) Active Problems:   Essential hypertension   Paroxysmal atrial fibrillation ([place])   Hyperlipidemia   Anemia  Diagnostic Studies/Procedures    Echo: 06/30/20  IMPRESSIONS    1. No LV thrombus seen on contrasted images. Left ventricular ejection  fraction, by estimation, is 30 to 35%. The left ventricle has moderately  decreased function. The left ventricle demonstrates regional wall motion  abnormalities: global hypokinesis  with inferior and inferoseptal akinesis. Left ventricular diastolic  parameters are indeterminate.  2. Left atrial size was mildly dilated.  3. The mitral valve is normal in structure. Trivial mitral valve  regurgitation.  4. The aortic valve is calcified. Aortic valve regurgitation is mild. No  aortic stenosis is present.  5. There is borderline dilatation of the aortic root, measuring 38 mm.  There is borderline dilatation of the ascending aorta, measuring 37 mm.  6. Right ventricular systolic function is normal. The right ventricular  size is normal.   Comparison(s): A prior study was performed on 04/27/2014. New decrease in  LVEF and WMA's. Primary cardiologist aware.   FINDINGS  Left Ventricle: No LV thrombus seen on contrasted images. Left  ventricular ejection fraction, by estimation, is 30 to 35%. Left  ventricular ejection fraction by 3D volume is 38 %. The left ventricle has  moderately decreased function. The left ventricle  demonstrates regional wall motion abnormalities. The left ventricular  internal cavity size was normal in size. There  is no left ventricular  hypertrophy of the basal-septal segment. Left ventricular diastolic  parameters are indeterminate.  _____________   History of Present Illness     Tom Ortiz is a 87 y.o. male with no reported significant medical history. Prior to hospital presentation he was not on any medications. Did not routinely see a medical doctor. He takes an occasional aspirin.  Apparently the afternoon of admission at approximately 4 PM he developed some vague chest discomfort.  Chest pain persisted off and on.  Ultimately around 830 that evening, family called EMS.  Upon EMS arrival the patient was having some chest pain.  His ECG showed Q waves in lead III, aVF with 1 to 2 mm of ST elevation with ST with 1 mm ST elevation in V4 through V6 6.  Q waves are also present V1 through V4.  In the ED after he received medication, apparently had complete resolution of chest pain.  Subsequent ECG showed resolution of ST elevation.  ECG showed resolution of ST elevation but there was mild T wave abnormality in V5 and V6 as well as aVF.  The patient's son had arrived.  With the patient being completely pain-free presently, after discussion with the son, the decision was made to cancel emergent catheterization date medical therapy.  Hospital Course     1. NSTEMI with initial presentation of ST elevation: initial EKG showed Q waves in inferior leads and 1 mm ST elevation in v4-v6. Had resolution of ST changes while in the ED and decision made to treat medically. hsTn peaked at 837. Echo noted an EF of 30-35% with global hypokinesis, inferoseptal akinesis. Findings  were discussed with wife and son, given his advanced age and frail state, plan was made to treat with medical therapy.  -- he was treated with IV heparin for 48 hrs -- added ASA, statin, metoprolol, lisinopril and Imdur  2. Acute systolic CHF: in the setting of MI, presumed to be ischemia. EF noted at 30-35%. No signs of volume overload on  exam -- was transitioned to Toprol 50mg  daily, and lisinopril. Suspect he will not tolerate Entresto as he has had some episodes of hypotension -- defer SGLT2 with advanced age and episodes of urinary incontinence   3. HLD: LDL 97 -- on atorvastatin 40mg  daily -- FLP/LFTs in 8 weeks  4. PAF with suspected aberrancy, RVR: notes indicate a possible hx of the same. Brief episodes noted on telemetry this admission in the setting of MI. Rates was elevated in the 160s at times, but self terminating.  -- TSH WNL, K+ and Mag+ stable -- continue BB -- Would not consider him a good candidate for [facility] with his advanced age, anemia and frail state.   Patient was seen by Dr. Martinique and deemed stable for discharge. Follow up in the office was arranged prior to discharge. Medications sent to [facility] and educated by PharmD prior to discharge.   Did the patient have an acute coronary syndrome (MI, NSTEMI, STEMI, etc) this admission?:  Yes                               AHA/ACC Clinical Performance & Quality Measures: 1. Aspirin prescribed? - Yes 2. ADP Receptor Inhibitor (Plavix/Clopidogrel, Brilinta/Ticagrelor or Effient/Prasugrel) prescribed (includes medically managed patients)? - No - managed medically given advanced age, frail state, anemia 3. Beta Blocker prescribed? - Yes 4. High Intensity Statin (Lipitor 40-80mg  or Crestor 20-40mg ) prescribed? - Yes 5. EF assessed during THIS hospitalization? - Yes 6. For EF <40%, was ACEI/ARB prescribed? - Yes 7. For EF <40%, Aldosterone Antagonist (Spironolactone or Eplerenone) prescribed? - No - Reason:  consider as outpatient if blood pressures are stable 8. Cardiac Rehab Phase II ordered (including medically managed patients)? - No - patient is very frail, limited mobility. Unable to particitpate in CR as an outpatient       _____________  Discharge Vitals Blood pressure (!) 158/76, pulse 71, temperature 98.4 F (36.9 C), temperature source Oral,  resp. rate 20, height 5\' 10"  (1.778 m), weight 69.7 kg, SpO2 95 %.  Filed Weights   06/29/20 2124 06/30/20 0000 06/30/20 0600  Weight: 74.8 kg 69.7 kg 69.7 kg    Labs & Radiologic Studies    CBC Recent Labs    06/29/20 2126 06/29/20 2138 06/30/20 0010 06/30/20 0100 07/01/20 0244 07/02/20 0056  WBC 5.6  --  5.2   < > 5.4 4.9  NEUTROABS 3.8  --  3.5  --   --   --   HGB 12.6*   < > 11.4*   < > 10.9* 10.6*  HCT 38.7*   < > 35.5*   < > 33.7* 33.1*  MCV 103.2*  --  104.4*   < > 105.0* 103.8*  PLT 196  --  184   < > 179 204   < > = values in this interval not displayed.   Basic Metabolic Panel Recent Labs    07/01/20 0244 07/01/20 0923 07/02/20 0056  NA 135  --  137  K 3.7  --  4.0  CL 107  --  108  CO2 23  --  24  GLUCOSE 88  --  86  BUN 15  --  17  CREATININE 1.09  --  1.19  CALCIUM 8.0*  --  8.1*  MG  --  1.9  --    Liver Function Tests Recent Labs    06/29/20 2126 06/30/20 0010  AST 29 26  ALT 13 12  ALKPHOS 69 60  BILITOT 0.5 0.4  PROT 6.2* 5.7*  ALBUMIN 2.8* 2.6*   No results for input(s): LIPASE, AMYLASE in the last 72 hours. High Sensitivity Troponin:   Recent Labs  Lab 06/29/20 2126 06/30/20 0010 06/30/20 0100  TROPONINIHS 850* 842* 817*    BNP Invalid input(s): POCBNP D-Dimer No results for input(s): DDIMER in the last 72 hours. Hemoglobin A1C Recent Labs    06/29/20 2126  HGBA1C 5.7*   Fasting Lipid Panel Recent Labs    06/30/20 0100  CHOL 152  HDL 42  LDLCALC 97  TRIG 66  CHOLHDL 3.6   Thyroid Function Tests Recent Labs    06/30/20 0010  TSH 0.858   _____________  ECHOCARDIOGRAM COMPLETE  Result Date: 06/30/2020    ECHOCARDIOGRAM REPORT   Patient Name:   Tom Ortiz Date of Exam: 06/30/2020 Medical Rec #:  [id number]        Height:       70.0 in Accession #:    [id number]       Weight:       153.7 lb Date of Birth:  12-Mar-1928         BSA:          1.866 m Patient Age:    49 years         BP:           112/69 mmHg  Patient Gender: M                HR:           45 bpm. Exam Location:  Inpatient Procedure: 2D Echo, Cardiac Doppler, Color Doppler and 3D Echo Indications:    Acute ischemic heart disease, unspecified  History:        Patient has prior history of Echocardiogram examinations, most                 recent 04/19/2014. CAD; Risk Factors:Hypertension and                 Dyslipidemia.  Sonographer:    Clayton Lefort RDCS (AE) Referring Phys: [place] Comments: Suboptimal subcostal window. IMPRESSIONS  1. No LV thrombus seen on contrasted images. Left ventricular ejection fraction, by estimation, is 30 to 35%. The left ventricle has moderately decreased function. The left ventricle demonstrates regional wall motion abnormalities: global hypokinesis with inferior and inferoseptal akinesis. Left ventricular diastolic parameters are indeterminate.  2. Left atrial size was mildly dilated.  3. The mitral valve is normal in structure. Trivial mitral valve regurgitation.  4. The aortic valve is calcified. Aortic valve regurgitation is mild. No aortic stenosis is present.  5. There is borderline dilatation of the aortic root, measuring 38 mm. There is borderline dilatation of the ascending aorta, measuring 37 mm.  6. Right ventricular systolic function is normal. The right ventricular size is normal. Comparison(s): A prior study was performed on 04/27/2014. New decrease in LVEF and WMA's. Primary cardiologist aware. FINDINGS  Left Ventricle: No LV thrombus seen on contrasted images. Left ventricular ejection fraction, by  estimation, is 30 to 35%. Left ventricular ejection fraction by 3D volume is 38 %. The left ventricle has moderately decreased function. The left ventricle demonstrates regional wall motion abnormalities. The left ventricular internal cavity size was normal in size. There is no left ventricular hypertrophy of the basal-septal segment. Left ventricular diastolic parameters are indeterminate.  LV  Wall Scoring: The mid and distal inferior wall, mid inferoseptal segment, and apical septal segment are akinetic. The mid and distal anterior wall, mid and distal lateral wall, mid anteroseptal segment, mid anterolateral segment, and apex are hypokinetic. The basal anteroseptal segment is normal. Right Ventricle: The right ventricular size is normal. No increase in right ventricular wall thickness. Right ventricular systolic function is normal. Left Atrium: Left atrial size was mildly dilated. Right Atrium: Right atrial size was normal in size. Pericardium: There is no evidence of pericardial effusion. Mitral Valve: The mitral valve is normal in structure. Trivial mitral valve regurgitation. MV peak gradient, 2.0 mmHg. The mean mitral valve gradient is 1.0 mmHg. Tricuspid Valve: The tricuspid valve is grossly normal. Tricuspid valve regurgitation is not demonstrated. Aortic Valve: The aortic valve is calcified. Aortic valve regurgitation is mild. Aortic regurgitation PHT measures 538 msec. No aortic stenosis is present. Aortic valve mean gradient measures 8.0 mmHg. Aortic valve peak gradient measures 12.5 mmHg. Aortic valve area, by VTI measures 1.08 cm. Pulmonic Valve: The pulmonic valve was not well visualized. Pulmonic valve regurgitation is not visualized. No evidence of pulmonic stenosis. Aorta: The ascending aorta was not well visualized. There is borderline dilatation of the aortic root, measuring 38 mm. There is borderline dilatation of the ascending aorta, measuring 37 mm. IAS/Shunts: The atrial septum is grossly normal.  LEFT VENTRICLE PLAX 2D LVIDd:         4.50 cm         Diastology LVIDs:         3.80 cm         LV e' medial:    4.79 cm/s LV PW:         1.90 cm         LV E/e' medial:  12.3 LV IVS:        2.10 cm         LV e' lateral:   3.81 cm/s LVOT diam:     2.00 cm         LV E/e' lateral: 15.4 LV SV:         43 LV SV Index:   23 LVOT Area:     3.14 cm        3D Volume EF                                 LV 3D EF:    Left                                             ventricular LV Volumes (MOD)                            ejection LV vol d, MOD    127.0 ml                   fraction by A2C:  3D volume LV vol d, MOD    140.0 ml                   is 38 %. A4C: LV vol s, MOD    80.7 ml A2C:                           3D Volume EF: LV vol s, MOD    97.0 ml       3D EF:        38 % A4C:                           LV EDV:       176 ml LV SV MOD A2C:   46.3 ml       LV ESV:       110 ml LV SV MOD A4C:   140.0 ml      LV SV:        66 ml LV SV MOD BP:    40.4 ml RIGHT VENTRICLE RV Basal diam:  2.90 cm RV S prime:     9.90 cm/s TAPSE (M-mode): 2.5 cm LEFT ATRIUM             Index       RIGHT ATRIUM           Index LA diam:        3.50 cm 1.88 cm/m  RA Area:     13.20 cm LA Vol (A2C):   50.2 ml 26.90 ml/m RA Volume:   29.20 ml  15.65 ml/m LA Vol (A4C):   70.0 ml 37.51 ml/m LA Biplane Vol: 64.2 ml 34.40 ml/m  AORTIC VALVE AV Area (Vmax):    1.14 cm AV Area (Vmean):   1.00 cm AV Area (VTI):     1.08 cm AV Vmax:           176.50 cm/s AV Vmean:          136.250 cm/s AV VTI:            0.395 m AV Peak Grad:      12.5 mmHg AV Mean Grad:      8.0 mmHg LVOT Vmax:         63.80 cm/s LVOT Vmean:        43.300 cm/s LVOT VTI:          0.136 m LVOT/AV VTI ratio: 0.34 AI PHT:            538 msec  AORTA Ao Root diam: 3.80 cm Ao Asc diam:  3.70 cm MITRAL VALVE               TRICUSPID VALVE MV Area (PHT): 2.62 cm    TR Peak grad:   14.3 mmHg MV Area VTI:   1.66 cm    TR Vmax:        189.00 cm/s MV Peak grad:  2.0 mmHg MV Mean grad:  1.0 mmHg    SHUNTS MV Vmax:       0.72 m/s    Systemic VTI:  0.14 m MV Vmean:      42.2 cm/s   Systemic Diam: 2.00 cm MV Decel Time: 290 msec MV E velocity: 58.80 cm/s MV A velocity: 57.50 cm/s MV E/A ratio:  1.02 Rudean Haskell MD Electronically signed by Rudean Haskell MD Signature Date/Time: 06/30/2020/6:56:36 PM    Final  Disposition   Pt is  being discharged home today in good condition.  Follow-up Plans & Appointments     Follow-up Information    Deberah Pelton, NP Follow up on 07/24/2020.   Specialty: Cardiology Why: at 11:15am for your follow up appt Contact information: [address] [place] [address] [phone]              Discharge Instructions    Call MD for:  difficulty breathing, headache or visual disturbances   Complete by: As directed    Call MD for:  persistant dizziness or light-headedness   Complete by: As directed    Call MD for:  redness, tenderness, or signs of infection (pain, swelling, redness, odor or green/yellow discharge around incision site)   Complete by: As directed    Diet - low sodium heart healthy   Complete by: As directed    Increase activity slowly   Complete by: As directed    No wound care   Complete by: As directed       Discharge Medications   Allergies as of 07/02/2020      Reactions   Fosamax [alendronate]    Other reaction(s): Unknown   Vilazodone Hcl    Other reaction(s): nausea   Quinolones Hives, Rash   87 yr old allergy to quinine      Medication List    TAKE these medications   aspirin 81 MG EC tablet Take 1 tablet (81 mg total) by mouth daily. Swallow whole. Start taking on: July 03, 2020 What changed:   medication strength  how much to take  when to take this  reasons to take this  additional instructions   atorvastatin 40 MG tablet Commonly known as: LIPITOR Take 1 tablet (40 mg total) by mouth daily. Start taking on: July 04, 7562   bismuth subsalicylate 332 RJ/18AC suspension Commonly known as: PEPTO BISMOL Take 30 mLs by mouth every 6 (six) hours as needed for indigestion or diarrhea or loose stools.   docusate sodium 100 MG capsule Commonly known as: COLACE Take 1 capsule (100 mg total) by mouth daily as needed for mild constipation.   feeding supplement Liqd Take 237 mLs by mouth daily as needed  (weight gain).   HYDROcodone-acetaminophen 5-325 MG tablet Commonly known as: NORCO/VICODIN Take 1-2 tablets by mouth every 6 (six) hours as needed for moderate pain.   isosorbide mononitrate 60 MG 24 hr tablet Commonly known as: IMDUR Take 1 tablet (60 mg total) by mouth daily. Start taking on: July 03, 2020   lisinopril 5 MG tablet Commonly known as: ZESTRIL Take 1 tablet (5 mg total) by mouth daily. Start taking on: July 03, 2020   LORazepam 0.5 MG tablet Commonly known as: ATIVAN Take 0.5 mg by mouth at bedtime as needed for sleep.   MELATONIN PO Take 15-20 mg by mouth 2 (two) times daily as needed (sleep).   metoprolol succinate 50 MG 24 hr tablet Commonly known as: TOPROL-XL Take 1 tablet (50 mg total) by mouth daily. Take with or immediately following a meal. Start taking on: July 03, 2020 What changed:   medication strength  how much to take  additional instructions   nitroGLYCERIN 0.4 MG SL tablet Commonly known as: NITROSTAT Place 1 tablet (0.4 mg total) under the tongue every 5 (five) minutes x 3 doses as needed for chest pain.         Outstanding Labs/Studies   FLP/LFTs in 8 weeks  Duration  of Discharge Encounter   Greater than 30 minutes including physician time.  Signed, Reino Bellis, NP 07/02/2020, 10:46 AM

## 2020-07-08 ENCOUNTER — Other Ambulatory Visit (HOSPITAL_COMMUNITY): Payer: Self-pay

## 2020-07-09 ENCOUNTER — Telehealth: Payer: Self-pay | Admitting: General Practice

## 2020-07-09 NOTE — Telephone Encounter (Signed)
Atorvastatin has a reported incidence of UTIs of 5.7% in its package insert which is why it populates as a side effect if you look it up, however this is compared to 5.6% of patients in the placebo group who also reported UTIs. Since there was only a 0.1% difference between groups, this was not significant and atorvastatin is not typically associated with UTIs. Agree he should have a urinalysis done. If he still wants to change statins, would offer him rosuvastatin 40mg  daily as an alternative.

## 2020-07-09 NOTE — Telephone Encounter (Signed)
The son has been made aware and will try to get the patient tested tomorrow.

## 2020-07-09 NOTE — Telephone Encounter (Signed)
Spoke with the patient's son who is the primary caregiver for the patient and his wife. He will be a new patient of Dr.Kelly but has an appointment with Tom Memos, NP on 07/24/20.  He stated that the patient was started on Atorvastatin and has read online that this can cause a UTI. He feels like the patient may have one. He has been advised to take him to his PCP for a urine culture but stated that his PCP won't see him since he is not COVID vaccinated. He has been advised to take him to a fast med or urgent care so he can be assessed but stated that it is hard to get him out but will call a primary office tomorrow to see if he can get him in.

## 2020-07-09 NOTE — Telephone Encounter (Signed)
[name] called to say patient has as UTI and the thinks it because of atorvastatin (LIPITOR) 40 MG tablet. [name] said UTI is a side effect of taking this medication. He wants to know is there another medication his father can take. Please advise

## 2020-07-10 NOTE — Telephone Encounter (Addendum)
Discussed with pt's son on taking pt to urgent care Per son pt has trouble ambulating and it is difficult to get around Spoke with Coletta Memos NP to see if could order urinalysis Denyse Amass suggested going to urgent care, as well . Pt's son should be able to go to urgent care and pick up specimen cup per discussion with Coletta Memos NP Informed pt's son of this and son does not think can do this Per son was not happy and stated will figure out what he can do./cy

## 2020-07-10 NOTE — Telephone Encounter (Signed)
    Pt's son is calling back, he said pt doesn't have a pcp right now and doesn't know where he can get pt's urine test. He needs help if the nurse can give him recommendation where to go

## 2020-07-21 NOTE — Progress Notes (Signed)
Cardiology Clinic Note   Patient Name: Tom Ortiz Date of Encounter: 07/24/2020  Primary Care Provider:  Pcp, No Primary Cardiologist:  Shelva Majestic, MD  Patient Profile    Tom Ortiz 87 year old male presents the clinic today for follow-up evaluation of his NSTEMI.  Past Medical History    Past Medical History:  Diagnosis Date  . Anxiety   . Arthritis   . Essential hypertension 04/16/2014  . Hard of hearing    tinnitus both ears  . Hypertension   . Paroxysmal atrial fibrillation ([place]) 04/28/2014  . Shoulder pain, left 07/14/2016   states left rotator pain preop   Past Surgical History:  Procedure Laterality Date  . APPENDECTOMY  yrs ago  . HERNIA REPAIR  yrs ago  . HIP CLOSED REDUCTION  07/01/2011, left   Procedure: [place];  Surgeon: Magnus Sinning, MD;  Location: [facility];  Service: Orthopedics;  Laterality: Left;  . HIP CLOSED REDUCTION  06/11/2011   Procedure: CLOSED MANIPULATION HIP;  Surgeon: Magnus Sinning, MD;  Location: [facility];  Service: Orthopedics;  Laterality: Left;  . HIP CLOSED REDUCTION  07/01/2011   Procedure: CLOSED MANIPULATION HIP;  Surgeon: Magnus Sinning, MD;  Location: [facility];  Service: Orthopedics;  Laterality: Left;  . HIP CLOSED REDUCTION  12/08/2011   Procedure: CLOSED MANIPULATION HIP;  Surgeon: Sydnee Cabal, MD;  Location: [facility];  Service: Orthopedics;  Laterality: Left;  . JOINT REPLACEMENT  2007 right, left 17 yrs ago   Bilateral hip  . NM MYOVIEW LTD  2012  . ORIF PERIPROSTHETIC FRACTURE Left 07/14/2016   Procedure: OPEN REDUCTION INTERNAL FIXATION (ORIF) PERIPROSTHETIC FRACTURE LEFT HIP;  Surgeon: Paralee Cancel, MD;  Location: [facility];  Service: Orthopedics;  Laterality: Left;  . TONSILLECTOMY  as child  . TOTAL HIP REVISION  10/02/2011   Procedure: TOTAL HIP REVISION;  Surgeon: Gearlean Alf, MD;  Location: [facility];  Service: Orthopedics;  Laterality: Right;  . TOTAL HIP REVISION  01/27/2012   Procedure:  TOTAL HIP REVISION;  Surgeon: Gearlean Alf, MD;  Location: [facility];  Service: Orthopedics;  Laterality: Left;    Allergies  Allergies  Allergen Reactions  . Fosamax [Alendronate]     Other reaction(s): Unknown  . Vilazodone Hcl     Other reaction(s): nausea  . Quinolones Hives and Rash    87 yr old allergy to quinine    History of Present Illness    Tom Ortiz has a PMH of NSTEMI, acute systolic CHF, hyperlipidemia, essential hypertension, and paroxysmal atrial fibrillation with RVR.  Prior to his hospitalization he was not on medications.  He presented to the hospital 06/29/2020 and was discharged on 07/02/2020.  He did not routinely see a medical doctor.  He reported he would take an occasional aspirin.  The afternoon prior to his admission he developed vague chest discomfort.  His chest pain persisted on and off.  Finally at around 830 that evening his family called EMS.  When EMS arrived he reported having chest pain.  His EKG showed Q waves and lead III, aVF with 1-2-2 mm ST elevation in V4 through V6.  Q waves are also present V1-V4.  In the emergency department he received medication and his chest pain resolved.  His subsequent EKG showed resolution of ST elevation.  His EKG did however show mild T wave abnormality in V5 and V6 as well as aVF.  Decision for cancellation of emergent catheterization was made and  medical management was recommended.  Echocardiogram showed an EF of 30-35% with global hypokinesis, anteroseptal akinesis.  Due to his age and frail state medical management was chosen as best course of therapy.  He was treated with IV heparin for 48 hours.  Aspirin, statin, metoprolol, lisinopril, and Imdur were added to his medication regimen.  He presents the clinic today for follow-up evaluation states he feels well.  He has had no further episodes of chest discomfort.  He denies bleeding issues and reports compliance with his medications.  He complains of occasional episodes  of burning urination in the morning.  He does have a history of recurrent UTIs.  His son questions whether the burning urination may be caused by statin medication.  He does not have a PCP and has problems with mobility.  He has not been to urgent care due to his mobility limitations.  We will order a UA, continue on his current medication regimen, order fasting lipids and LFTs in 4 weeks, give a salty 6 diet sheet, and have him follow-up with Dr. Tresa Endo in 3 to 4 months.  Today he denies chest pain, shortness of breath, lower extremity edema, fatigue, palpitations, melena, hematuria, hemoptysis, diaphoresis, weakness, presyncope, syncope, orthopnea, and PND.   Home Medications    Prior to Admission medications   Medication Sig Start Date End Date Taking? Authorizing Provider  aspirin 81 MG EC tablet Take 1 tablet (81 mg total) by mouth daily. Swallow whole. 07/03/20   Lennette Bihari, MD  atorvastatin (LIPITOR) 40 MG tablet Take 1 tablet (40 mg total) by mouth daily. 07/03/20   Lennette Bihari, MD  bismuth subsalicylate (PEPTO BISMOL) 262 MG/15ML suspension Take 30 mLs by mouth every 6 (six) hours as needed for indigestion or diarrhea or loose stools.    [provider]  docusate sodium (COLACE) 100 MG capsule Take 1 capsule (100 mg total) by mouth daily as needed for mild constipation. 07/02/20   Lennette Bihari, MD  feeding supplement (ENSURE ENLIVE / ENSURE PLUS) LIQD Take 237 mLs by mouth daily as needed (weight gain). 07/02/20   Lennette Bihari, MD  HYDROcodone-acetaminophen (NORCO/VICODIN) 5-325 MG tablet Take 1-2 tablets by mouth every 6 (six) hours as needed for moderate pain. 07/14/16   Lanney Gins, PA-C  isosorbide mononitrate (IMDUR) 60 MG 24 hr tablet Take 1 tablet (60 mg total) by mouth daily. 07/03/20   Lennette Bihari, MD  lisinopril (ZESTRIL) 5 MG tablet Take 1 tablet (5 mg total) by mouth daily. 07/03/20   Lennette Bihari, MD  LORazepam (ATIVAN) 0.5 MG tablet Take 0.5 mg by  mouth at bedtime as needed for sleep.  07/07/16   [provider]  MELATONIN PO Take 15-20 mg by mouth 2 (two) times daily as needed (sleep).    [provider]  metoprolol succinate (TOPROL-XL) 50 MG 24 hr tablet Take 1 tablet (50 mg total) by mouth daily. Take with or immediately following a meal. 07/03/20   Lennette Bihari, MD  nitroGLYCERIN (NITROSTAT) 0.4 MG SL tablet Place 1 tablet (0.4 mg total) under the tongue every 5 (five) minutes x 3 doses as needed for chest pain. 07/02/20   Lennette Bihari, MD    Family History    History reviewed. No pertinent family history. He indicated that his mother is deceased. He indicated that his father is deceased.  Social History    Social History   Socioeconomic History  . Marital status: Married  Spouse name: Not on file  . Number of children: Not on file  . Years of education: Not on file  . Highest education level: Not on file  Occupational History  . Not on file  Tobacco Use  . Smoking status: Former Smoker    Packs/day: 0.50    Years: 20.00    Pack years: 10.00    Types: Cigarettes    Start date: 07/12/1973    Quit date: 07/12/1993    Years since quitting: 27.0  . Smokeless tobacco: Former Systems developer    Quit date: 01/21/1962  . Tobacco comment: QUIT 21 YEARS AGP  Substance and Sexual Activity  . Alcohol use: No    Alcohol/week: 0.0 standard drinks  . Drug use: No  . Sexual activity: Never  Other Topics Concern  . Not on file  Social History Narrative  . Not on file   Social Determinants of Health   Financial Resource Strain: Not on file  Food Insecurity: Not on file  Transportation Needs: Not on file  Physical Activity: Not on file  Stress: Not on file  Social Connections: Not on file  Intimate Partner Violence: Not on file     Review of Systems    General:  No chills, fever, night sweats or weight changes.  Cardiovascular:  No chest pain, dyspnea on exertion, edema, orthopnea, palpitations, paroxysmal  nocturnal dyspnea. Dermatological: No rash, lesions/masses Respiratory: No cough, dyspnea Urologic: No hematuria, dysuria Abdominal:   No nausea, vomiting, diarrhea, bright red blood per rectum, melena, or hematemesis Neurologic:  No visual changes, wkns, changes in mental status. All other systems reviewed and are otherwise negative except as noted above.  Physical Exam    VS:  BP 140/72 (BP Location: Left Arm, Patient Position: Sitting, Cuff Size: Normal)   Pulse (!) 58   Ht 5\' 10"  (1.778 m)   Wt 155 lb 9.6 oz (70.6 kg)   BMI 22.33 kg/m  , BMI Body mass index is 22.33 kg/m. GEN: Well nourished, well developed, in no acute distress. HEENT: normal. Neck: Supple, no JVD, carotid bruits, or masses. Cardiac: RRR, no murmurs, rubs, or gallops. No clubbing, cyanosis, edema.  Radials/DP/PT 2+ and equal bilaterally.  Respiratory:  Respirations regular and unlabored, clear to auscultation bilaterally. GI: Soft, nontender, nondistended, BS + x 4. MS: no deformity or atrophy. Skin: warm and dry, no rash. Neuro:  Strength and sensation are intact. Psych: Normal affect.  Accessory Clinical Findings    Recent Labs: 06/30/2020: ALT 12; TSH 0.858 07/01/2020: Magnesium 1.9 07/02/2020: BUN 17; Creatinine, Ser 1.19; Hemoglobin 10.6; Platelets 204; Potassium 4.0; Sodium 137   Recent Lipid Panel    Component Value Date/Time   CHOL 152 06/30/2020 0100   TRIG 66 06/30/2020 0100   HDL 42 06/30/2020 0100   CHOLHDL 3.6 06/30/2020 0100   VLDL 13 06/30/2020 0100   LDLCALC 97 06/30/2020 0100    ECG personally reviewed by me today-none today.  Echocardiogram 06/30/2020 IMPRESSIONS    1. No LV thrombus seen on contrasted images. Left ventricular ejection  fraction, by estimation, is 30 to 35%. The left ventricle has moderately  decreased function. The left ventricle demonstrates regional wall motion  abnormalities: global hypokinesis  with inferior and inferoseptal akinesis. Left ventricular  diastolic  parameters are indeterminate.  2. Left atrial size was mildly dilated.  3. The mitral valve is normal in structure. Trivial mitral valve  regurgitation.  4. The aortic valve is calcified. Aortic valve regurgitation is mild. No  aortic stenosis is present.  5. There is borderline dilatation of the aortic root, measuring 38 mm.  There is borderline dilatation of the ascending aorta, measuring 37 mm.  6. Right ventricular systolic function is normal. The right ventricular  size is normal.   Comparison(s): A prior study was performed on 04/27/2014. New decrease in  LVEF and WMA's. Primary cardiologist aware.  Assessment & Plan   1.  NSTEMI- no chest pain today.  Presented to the emergency department 06/29/20 with acute EKG changes.  His chest pain resolved.  Shared decision making was used and medical management was recommended.  His echocardiogram 06/30/2020 showed an LVEF of 30-35%, mildly dilated left atria, trivial mitral valve regurgitation. Continue atorvastatin, aspirin, metoprolol, lisinopril, Imdur Heart healthy low-sodium diet-salty 6 given Increase physical activity as tolerated  Acute systolic CHF- no increased DOE or activity intolerance today.  Euvolemic.  Secondary to MI.  Not a candidate for Entresto due to hypotension.  SGL T 2 deferred due to advanced age and episodes of urinary incontinence. Continue metoprolol Heart healthy low-sodium diet-salty 6 given Increase physical activity as tolerated  Hyperlipidemia-06/30/2020: Cholesterol 152; HDL 42; LDL Cholesterol 97; Triglycerides 66; VLDL 13 Continue atorvastatin Repeat fasting lipids and LFTs in 4 weeks.  Paroxysmal atrial fibrillation- heart rate today 58 BPM.  Denies further episodes of increased or irregular heartbeat.  Not considered a candidate for DOAC due to age, anemia, and frail state. Continue metoprolol  heart healthy low-sodium diet-salty 6 given Increase physical activity as  tolerated  Burning urination- reports intermittent periods of painful urination.  Son reports that he felt like it may be related to statin medication.  Atorvastatin has been reviewed by clinical pharmacist and there is a 0.1% chance the atorvastatin may be causing his urinary tract infection.  Patient has a past history of recurrent UTI. Continue atorvastatin Increase p.o. hydration Order UA    Disposition: Follow-up with Dr. Claiborne Billings in 3-4 months.  Jossie Ng. Hobert Poplaski NP-C    07/24/2020, 12:00 PM [place] [address] Office [phone] Fax [phone]  Notice: This dictation was prepared with Dragon dictation along with smaller phrase technology. Any transcriptional errors that result from this process are unintentional and may not be corrected upon review.  I spent 13 minutes examining this patient, reviewing medications, and using patient centered shared decision making involving her cardiac care.  Prior to her visit I spent greater than 20 minutes reviewing her past medical history,  medications, and prior cardiac tests.

## 2020-07-24 ENCOUNTER — Encounter: Payer: Self-pay | Admitting: General Practice

## 2020-07-24 ENCOUNTER — Other Ambulatory Visit: Payer: Self-pay

## 2020-07-24 ENCOUNTER — Ambulatory Visit (INDEPENDENT_AMBULATORY_CARE_PROVIDER_SITE_OTHER): Payer: Medicare Other | Admitting: General Practice

## 2020-07-24 VITALS — BP 140/72 | HR 58 | Ht 70.0 in | Wt 155.6 lb

## 2020-07-24 DIAGNOSIS — I480 Paroxysmal atrial fibrillation: Secondary | ICD-10-CM

## 2020-07-24 DIAGNOSIS — I5021 Acute systolic (congestive) heart failure: Secondary | ICD-10-CM | POA: Diagnosis not present

## 2020-07-24 DIAGNOSIS — E7800 Pure hypercholesterolemia, unspecified: Secondary | ICD-10-CM | POA: Diagnosis not present

## 2020-07-24 DIAGNOSIS — I214 Non-ST elevation (NSTEMI) myocardial infarction: Secondary | ICD-10-CM | POA: Diagnosis not present

## 2020-07-24 DIAGNOSIS — Z79899 Other long term (current) drug therapy: Secondary | ICD-10-CM

## 2020-07-24 DIAGNOSIS — R300 Dysuria: Secondary | ICD-10-CM

## 2020-07-24 NOTE — Patient Instructions (Addendum)
Medication Instructions:  The current medical regimen is effective;  continue present plan and medications as directed. Please refer to the Current Medication list given to you today.  *If you need a refill on your cardiac medications before your next appointment, please call your pharmacy*  Lab Work: UA TODAY and.. FASTING LIPID AND LFT IN 4 WEEKS  If you have labs (blood work) drawn today and your tests are completely normal, you will receive your results only by:  [place] (if you have MyChart) OR A paper copy in the mail.  If you have any lab test that is abnormal or we need to change your treatment, we will call you to review the results. You may go to any [facility] that is convenient for you however, we do have a lab in our office that is able to assist you. You DO NOT need an appointment for our lab. The lab is open 8:00am and closes at 4:00pm. Lunch 12:45 - 1:45pm.  Special Instructions MAKE SURE TO STAY HYDRATED  PLEASE READ AND FOLLOW SALTY 6-ATTACHED-1,800 mg daily  Follow-Up: Your next appointment:  3-4 month(s) In Person with Sanda Klein, MD ONLY  At [facility], you and your health needs are our priority.  As part of our continuing mission to provide you with exceptional heart care, we have created designated Provider Care Teams.  These Care Teams include your primary Cardiologist (physician) and Advanced Practice Providers (APPs -  Physician Assistants and Nurse Practitioners) who all work together to provide you with the care you need, when you need it.  We recommend signing up for the patient portal called "MyChart".  Sign up information is provided on this After Visit Summary.  MyChart is used to connect with patients for Virtual Visits (Telemedicine).  Patients are able to view lab/test results, encounter notes, upcoming appointments, etc.  Non-urgent messages can be sent to your provider as well.   To learn more about what you can do with MyChart, go to  [email or web address].

## 2020-08-21 ENCOUNTER — Other Ambulatory Visit: Payer: Self-pay

## 2020-08-21 DIAGNOSIS — R300 Dysuria: Secondary | ICD-10-CM | POA: Diagnosis not present

## 2020-08-21 DIAGNOSIS — Z79899 Other long term (current) drug therapy: Secondary | ICD-10-CM | POA: Diagnosis not present

## 2020-08-21 DIAGNOSIS — E7800 Pure hypercholesterolemia, unspecified: Secondary | ICD-10-CM | POA: Diagnosis not present

## 2020-08-21 DIAGNOSIS — I480 Paroxysmal atrial fibrillation: Secondary | ICD-10-CM | POA: Diagnosis not present

## 2020-08-21 DIAGNOSIS — I214 Non-ST elevation (NSTEMI) myocardial infarction: Secondary | ICD-10-CM | POA: Diagnosis not present

## 2020-08-21 DIAGNOSIS — I5021 Acute systolic (congestive) heart failure: Secondary | ICD-10-CM | POA: Diagnosis not present

## 2020-08-21 LAB — HEPATIC FUNCTION PANEL
ALT: 6 IU/L (ref 0–44)
AST: 16 IU/L (ref 0–40)
Albumin: 3.3 g/dL — ABNORMAL LOW (ref 3.5–4.6)
Alkaline Phosphatase: 100 IU/L (ref 44–121)
Bilirubin Total: 0.3 mg/dL (ref 0.0–1.2)
Bilirubin, Direct: 0.12 mg/dL (ref 0.00–0.40)
Total Protein: 6 g/dL (ref 6.0–8.5)

## 2020-08-21 LAB — URINALYSIS
Bilirubin, UA: NEGATIVE
Glucose, UA: NEGATIVE
Ketones, UA: NEGATIVE
Nitrite, UA: NEGATIVE
Specific Gravity, UA: 1.016 (ref 1.005–1.030)
Urobilinogen, Ur: 0.2 mg/dL (ref 0.2–1.0)
pH, UA: 6 (ref 5.0–7.5)

## 2020-08-21 LAB — LIPID PANEL
Chol/HDL Ratio: 2.5 ratio (ref 0.0–5.0)
Cholesterol, Total: 98 mg/dL — ABNORMAL LOW (ref 100–199)
HDL: 39 mg/dL — ABNORMAL LOW (ref >39)
LDL Chol Calc (NIH): 40 mg/dL (ref 0–99)
Triglycerides: 97 mg/dL (ref 0–149)
VLDL Cholesterol Cal: 19 mg/dL (ref 5–40)

## 2020-08-22 ENCOUNTER — Other Ambulatory Visit: Payer: Self-pay

## 2020-08-22 ENCOUNTER — Telehealth: Payer: Self-pay | Admitting: Cardiovascular Disease

## 2020-08-22 MED ORDER — SULFAMETHOXAZOLE-TRIMETHOPRIM 800-160 MG PO TABS: 1.0000 | ORAL_TABLET | Freq: Two times a day (BID) | ORAL | 0 refills | Status: DC

## 2020-08-22 NOTE — Progress Notes (Signed)
[id number]-2nd attempt.

## 2020-08-22 NOTE — Telephone Encounter (Signed)
SEE RESULT NOTE 

## 2020-08-22 NOTE — Telephone Encounter (Signed)
Deberah Pelton, NP  08/22/2020  6:48 AM EDT Back to Top     Please contact Mr.Quest and let him know that his lab work is been reviewed.  It is normal for him at this time.  No further testing is needed.  We willcontinue with his current medication regimen.  Thank you.

## 2020-08-22 NOTE — Telephone Encounter (Signed)
Pt's son Alp is calling to obtain pt's most recent lab results from 08/21/20, pt's wife was called this morning with the results but she is hard of hearing and she did not understand anything. Pt's son Maricela would like a return call on his cell phone [phone]

## 2020-11-15 ENCOUNTER — Ambulatory Visit: Payer: Medicare Other | Admitting: Cardiovascular Disease

## 2020-12-03 ENCOUNTER — Encounter: Payer: Self-pay | Admitting: Cardiovascular Disease

## 2020-12-03 ENCOUNTER — Ambulatory Visit (INDEPENDENT_AMBULATORY_CARE_PROVIDER_SITE_OTHER): Payer: Medicare Other | Admitting: Cardiovascular Disease

## 2020-12-03 VITALS — BP 130/62 | HR 64 | Wt 143.0 lb

## 2020-12-03 DIAGNOSIS — E7800 Pure hypercholesterolemia, unspecified: Secondary | ICD-10-CM

## 2020-12-03 DIAGNOSIS — I5022 Chronic systolic (congestive) heart failure: Secondary | ICD-10-CM | POA: Diagnosis not present

## 2020-12-03 DIAGNOSIS — I480 Paroxysmal atrial fibrillation: Secondary | ICD-10-CM

## 2020-12-03 DIAGNOSIS — I2510 Atherosclerotic heart disease of native coronary artery without angina pectoris: Secondary | ICD-10-CM | POA: Diagnosis not present

## 2020-12-03 DIAGNOSIS — F0390 Unspecified dementia without behavioral disturbance: Secondary | ICD-10-CM | POA: Diagnosis not present

## 2020-12-03 NOTE — Patient Instructions (Signed)
Medication Instructions:  No changes *If you need a refill on your cardiac medications before your next appointment, please call your pharmacy*   Lab Work: None ordered If you have labs (blood work) drawn today and your tests are completely normal, you will receive your results only by: [place] (if you have MyChart) OR A paper copy in the mail If you have any lab test that is abnormal or we need to change your treatment, we will call you to review the results.   Testing/Procedures: None ordered   Follow-Up: At [facility], you and your health needs are our priority.  As part of our continuing mission to provide you with exceptional heart care, we have created designated Provider Care Teams.  These Care Teams include your primary Cardiologist (physician) and Advanced Practice Providers (APPs -  Physician Assistants and Nurse Practitioners) who all work together to provide you with the care you need, when you need it.  We recommend signing up for the patient portal called "MyChart".  Sign up information is provided on this After Visit Summary.  MyChart is used to connect with patients for Virtual Visits (Telemedicine).  Patients are able to view lab/test results, encounter notes, upcoming appointments, etc.  Non-urgent messages can be sent to your provider as well.   To learn more about what you can do with MyChart, go to [email or web address].    Your next appointment:   6 month(s)  The format for your next appointment:   In Person  Provider:   You may see Dr. Sallyanne Kuster or one of the following Advanced Practice Providers on your designated Care Team:   Almyra Deforest, PA-C Fabian Sharp, PA-C or  Roby Lofts, [place]

## 2020-12-03 NOTE — Progress Notes (Signed)
Cardiology Office Note:    Date:  12/07/2020   ID:  REAGAN KLEMZ, DOB 1927-03-17, MRN [id number]  PCP:  Kathyrn Lass   [facility] Providers Cardiologist:  Shelva Majestic, MD     Referring MD: No ref. provider found   Chief Complaint  Patient presents with   Follow-up    Hospitalization   Congestive Heart Failure    History of Present Illness:    Tom Ortiz is a 87 y.o. male with a hx of presentation with acute inferior STEMI in April 4270 complicated by acute decompensation of systolic heart failure and paroxysmal atrial fibrillation rapid ventricular response.  ST segment elevation and chest pain improved before he was taken to the Cath Lab and decision was made to continue with medical management in view of his age and cognitive difficulties.  The follow-up echocardiogram showed LVEF of around 35% with extensive inferior and inferoseptal wall motion abnormalities.  He was mentioned medical therapy.  He has not had overt problems with acute heart failure or recurrence of angina since that time.  He has paroxysmal atrial fibrillation, but is not on anticoagulation.  Atrial fibrillation was initially diagnosed in 2016, incidentally detected by his gastroenterologist doing a colonoscopy.  He was started on oral anticoagulants at the time, but has not been taking them recently.  The review of systems is largely obtained from his son Deandre Brannan who is here for the appointment.  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  He is deeply involved with caring for his parents and is well aware of the limitations imposed by the advanced age and progressive dementia that his father displays.  After careful thought, he clearly wants to focus on treatments that will improve his father's quality of life and does not want aggressive treatments or invasive procedures.  The patient is in a wheelchair and appears very weak, thin and frail.  He repeatedly professes his confusion regarding  the purpose of his visit, his current health symptoms and defers most of the questions to his son.  He requires care with all activities of daily living.  He has not had issues with palpitations, dizziness, syncope, but is always very weak.  He denies orthopnea or PND.  He has not had edema.  He denies chest pain.  Although he is extremely unsteady, has not had serious falls or bleeding problems.  Guideline directed medical therapy has been limited by low blood pressure.  He is on a moderate dose of metoprolol succinate and a very low-dose of lisinopril.  It is felt that his blood pressure would not tolerate Entresto.  He has problems with incontinence and SGLT2 inhibitors are relatively contraindicated.  He is taking active dose of a potent statin.  His most recent lipid profile showed an LDL cholesterol of 40.  All other lipid parameters were acceptable range.  He is on aspirin.  Past Medical History:  Diagnosis Date   Anxiety    Arthritis    Essential hypertension 04/16/2014   Hard of hearing    tinnitus both ears   Hypertension    Paroxysmal atrial fibrillation ([place]) 04/28/2014   Shoulder pain, left 07/14/2016   states left rotator pain preop    Past Surgical History:  Procedure Laterality Date   APPENDECTOMY  yrs ago   [place]  yrs ago   HIP CLOSED REDUCTION  07/01/2011, left   Procedure: [place];  Surgeon: Magnus Sinning, MD;  Location: [facility];  Service: Orthopedics;  Laterality: Left;   HIP CLOSED REDUCTION  06/11/2011   Procedure: CLOSED MANIPULATION HIP;  Surgeon: Magnus Sinning, MD;  Location: [facility];  Service: Orthopedics;  Laterality: Left;   HIP CLOSED REDUCTION  07/01/2011   Procedure: CLOSED MANIPULATION HIP;  Surgeon: Magnus Sinning, MD;  Location: [facility];  Service: Orthopedics;  Laterality: Left;   HIP CLOSED REDUCTION  12/08/2011   Procedure: CLOSED MANIPULATION HIP;  Surgeon: Sydnee Cabal, MD;  Location: [facility];  Service: Orthopedics;   Laterality: Left;   JOINT REPLACEMENT  2007 right, left 17 yrs ago   Bilateral hip   NM MYOVIEW LTD  2012   ORIF PERIPROSTHETIC FRACTURE Left 07/14/2016   Procedure: OPEN REDUCTION INTERNAL FIXATION (ORIF) PERIPROSTHETIC FRACTURE LEFT HIP;  Surgeon: Paralee Cancel, MD;  Location: [facility];  Service: Orthopedics;  Laterality: Left;   TONSILLECTOMY  as child   TOTAL HIP REVISION  10/02/2011   Procedure: TOTAL HIP REVISION;  Surgeon: Gearlean Alf, MD;  Location: [facility];  Service: Orthopedics;  Laterality: Right;   TOTAL HIP REVISION  01/27/2012   Procedure: TOTAL HIP REVISION;  Surgeon: Gearlean Alf, MD;  Location: [facility];  Service: Orthopedics;  Laterality: Left;    Current Medications: Current Meds  Medication Sig   aspirin 81 MG EC tablet Take 1 tablet (81 mg total) by mouth daily. Swallow whole.   atorvastatin (LIPITOR) 40 MG tablet Take 1 tablet (40 mg total) by mouth daily.   bismuth subsalicylate (PEPTO BISMOL) 262 MG/15ML suspension Take 30 mLs by mouth every 6 (six) hours as needed for indigestion or diarrhea or loose stools.   feeding supplement (ENSURE ENLIVE / ENSURE PLUS) LIQD Take 237 mLs by mouth daily as needed (weight gain).   isosorbide mononitrate (IMDUR) 60 MG 24 hr tablet Take 1 tablet (60 mg total) by mouth daily.   lisinopril (ZESTRIL) 5 MG tablet Take 1 tablet (5 mg total) by mouth daily.   metoprolol succinate (TOPROL-XL) 50 MG 24 hr tablet Take 1 tablet (50 mg total) by mouth daily. Take with or immediately following a meal.     Allergies:   Fosamax [alendronate], Vilazodone hcl, and Quinolones   Social History   Socioeconomic History   Marital status: Married    Spouse name: Not on file   Number of children: Not on file   Years of education: Not on file   Highest education level: Not on file  Occupational History   Not on file  Tobacco Use   Smoking status: Former    Packs/day: 0.50    Years: 20.00    Pack years: 10.00    Types: Cigarettes    Start  date: 07/12/1973    Quit date: 07/12/1993    Years since quitting: 27.4   Smokeless tobacco: Former    Quit date: 01/21/1962   Tobacco comments:    QUIT 21 YEARS AGP  Substance and Sexual Activity   Alcohol use: No    Alcohol/week: 0.0 standard drinks   Drug use: No   Sexual activity: Never  Other Topics Concern   Not on file  Social History Narrative   Not on file   Social Determinants of Health   Financial Resource Strain: Not on file  Food Insecurity: Not on file  Transportation Needs: Not on file  Physical Activity: Not on file  Stress: Not on file  Social Connections: Not on file     Family History: The patient's family history is  largely unknown.  His son does not have cardiac problems.  ROS:   Please see the history of present illness.     All other systems reviewed and are negative.  EKGs/Labs/Other Studies Reviewed:    The following studies were reviewed today: Echocardiogram 06/30/2020  Echo: 06/30/20   IMPRESSIONS     1. No LV thrombus seen on contrasted images. Left ventricular ejection  fraction, by estimation, is 30 to 35%. The left ventricle has moderately  decreased function. The left ventricle demonstrates regional wall motion  abnormalities: global hypokinesis  with inferior and inferoseptal akinesis. Left ventricular diastolic  parameters are indeterminate.   2. Left atrial size was mildly dilated.   3. The mitral valve is normal in structure. Trivial mitral valve  regurgitation.   4. The aortic valve is calcified. Aortic valve regurgitation is mild. No  aortic stenosis is present.   5. There is borderline dilatation of the aortic root, measuring 38 mm.  There is borderline dilatation of the ascending aorta, measuring 37 mm.   6. Right ventricular systolic function is normal. The right ventricular  size is normal.   Comparison(s): A prior study was performed on 04/27/2014. New decrease in  LVEF and WMA's. Primary cardiologist aware.   EKG:   EKG is not ordered today.  The ekg ordered today demonstrates sinus rhythm with first-degree AV block, Q waves in the inferior leads as well as in leads V1-V2 and subtle nonspecific ST segment elevation that persist in the inferior leads as well as V5-V6.  Recent Labs: 06/30/2020: TSH 0.858 07/01/2020: Magnesium 1.9 07/02/2020: BUN 17; Creatinine, Ser 1.19; Hemoglobin 10.6; Platelets 204; Potassium 4.0; Sodium 137 08/21/2020: ALT 6  Recent Lipid Panel    Component Value Date/Time   CHOL 98 (L) 08/21/2020 0921   TRIG 97 08/21/2020 0921   HDL 39 (L) 08/21/2020 0921   CHOLHDL 2.5 08/21/2020 0921   CHOLHDL 3.6 06/30/2020 0100   VLDL 13 06/30/2020 0100   LDLCALC 40 08/21/2020 0921     Risk Assessment/Calculations:    CHA2DS2-VASc Score = 5   This indicates a 7.2% annual risk of stroke. The patient's score is based upon: CHF History: 1 HTN History: 1 Diabetes History: 0 Stroke History: 0 Vascular Disease History: 1 Age Score: 2 Gender Score: 0         Physical Exam:    VS:  BP 130/62 (BP Location: Left Arm, Patient Position: Sitting, Cuff Size: Normal)   Pulse 64   Wt 64.9 kg   SpO2 99%   BMI 20.52 kg/m     Wt Readings from Last 3 Encounters:  12/03/20 64.9 kg  07/24/20 70.6 kg  06/30/20 69.7 kg     GEN: He appears very frail, underweight, confused but in no acute distress HEENT: Normal NECK: No JVD; No carotid bruits LYMPHATICS: No lymphadenopathy CARDIAC: RRR, no murmurs, rubs, gallops RESPIRATORY:  Clear to auscultation without rales, wheezing or rhonchi  ABDOMEN: Soft, non-tender, non-distended MUSCULOSKELETAL:  No edema; No deformity  SKIN: Warm and dry NEUROLOGIC:  Alert and oriented x 3 PSYCHIATRIC:  Normal affect   ASSESSMENT:    1. Chronic systolic heart failure ([place])   2. Coronary artery disease involving native coronary artery of native heart without angina pectoris   3. PAF (paroxysmal atrial fibrillation) ([place])   4. Dementia without behavioral  disturbance, unspecified dementia type ([place])   5. Pure hypercholesterolemia    PLAN:    In order of problems listed above:  CHF: Functional  status is poor due to generalized frailty and age, but he does not have any overt physical findings to suggest hypervolemia.  I agree that it is unlikely he will tolerate more potent medical therapy and that he is not a good candidate for SGLT 2 inhibitors due to the risks of complications.   CAD: Extent of coronary disease is unknown, he has not had cardiac catheterization.  Nevertheless I suspect that he has multivessel disease.  Echo showed evidence of an inferior STEMI, but his ECG also raises concern for problems in the territory of the LAD.  Likely to have multivessel disease and not a candidate for revascularization.  He is not complaining of angina.  Continue aspirin, beta-blocker, statin. PAFib: He has not had any symptoms of rapid palpitations.  He is in sinus rhythm today.  Spent quite a bit of time with [employer] discussing the pros and cons of full anticoagulation and we decided that continued treatment with aspirin is probably the best choice and this patient's case.  Although he is at increased risk for embolic events, he is also at high risk for falls, bleeding complications and his overall prognosis is quite limited. Dementia: Precedes his presentation with acute MI and this coupled with significant frailty.  Needs to be taken to account whenever we make decisions regarding his care. HLP: LDL at target.  Continue statin.        Medication Adjustments/Labs and Tests Ordered: Current medicines are reviewed at length with the patient today.  Concerns regarding medicines are outlined above.  No orders of the defined types were placed in this encounter.  No orders of the defined types were placed in this encounter.   Patient Instructions  Medication Instructions:  No changes *If you need a refill on your cardiac medications before your  next appointment, please call your pharmacy*   Lab Work: None ordered If you have labs (blood work) drawn today and your tests are completely normal, you will receive your results only by: [place] (if you have MyChart) OR A paper copy in the mail If you have any lab test that is abnormal or we need to change your treatment, we will call you to review the results.   Testing/Procedures: None ordered   Follow-Up: At [facility], you and your health needs are our priority.  As part of our continuing mission to provide you with exceptional heart care, we have created designated Provider Care Teams.  These Care Teams include your primary Cardiologist (physician) and Advanced Practice Providers (APPs -  Physician Assistants and Nurse Practitioners) who all work together to provide you with the care you need, when you need it.  We recommend signing up for the patient portal called "MyChart".  Sign up information is provided on this After Visit Summary.  MyChart is used to connect with patients for Virtual Visits (Telemedicine).  Patients are able to view lab/test results, encounter notes, upcoming appointments, etc.  Non-urgent messages can be sent to your provider as well.   To learn more about what you can do with MyChart, go to [email or web address].    Your next appointment:   6 month(s)  The format for your next appointment:   In Person  Provider:   You may see Dr. Sallyanne Kuster or one of the following Advanced Practice Providers on your designated Care Team:   Almyra Deforest, PA-C Fabian Sharp, [place] or  Roby Lofts, PA-C    Signed, Sanda Klein, MD  12/07/2020 12:49 PM  [facility]

## 2020-12-07 ENCOUNTER — Encounter: Payer: Self-pay | Admitting: Cardiovascular Disease

## 2021-05-29 ENCOUNTER — Other Ambulatory Visit (HOSPITAL_COMMUNITY): Payer: Self-pay

## 2021-07-21 ENCOUNTER — Other Ambulatory Visit: Payer: Self-pay | Admitting: Cardiovascular Disease

## 2022-04-04 ENCOUNTER — Inpatient Hospital Stay (HOSPITAL_COMMUNITY)
Admission: EM | Admit: 2022-04-04 | Discharge: 2022-04-14 | DRG: 175 | Disposition: A | Discharge status: Hospice | Payer: Medicare Other | Attending: Family Medicine | Admitting: Family Medicine

## 2022-04-04 ENCOUNTER — Emergency Department (HOSPITAL_COMMUNITY): Payer: Medicare Other

## 2022-04-04 DIAGNOSIS — Z79899 Other long term (current) drug therapy: Secondary | ICD-10-CM

## 2022-04-04 DIAGNOSIS — I10 Essential (primary) hypertension: Secondary | ICD-10-CM | POA: Diagnosis present

## 2022-04-04 DIAGNOSIS — R7989 Other specified abnormal findings of blood chemistry: Secondary | ICD-10-CM | POA: Diagnosis not present

## 2022-04-04 DIAGNOSIS — Z87891 Personal history of nicotine dependence: Secondary | ICD-10-CM | POA: Diagnosis not present

## 2022-04-04 DIAGNOSIS — Z1152 Encounter for screening for COVID-19: Secondary | ICD-10-CM

## 2022-04-04 DIAGNOSIS — I2510 Atherosclerotic heart disease of native coronary artery without angina pectoris: Secondary | ICD-10-CM | POA: Insufficient documentation

## 2022-04-04 DIAGNOSIS — R778 Other specified abnormalities of plasma proteins: Secondary | ICD-10-CM | POA: Diagnosis not present

## 2022-04-04 DIAGNOSIS — T8383XA Hemorrhage of genitourinary prosthetic devices, implants and grafts, initial encounter: Secondary | ICD-10-CM | POA: Diagnosis not present

## 2022-04-04 DIAGNOSIS — N179 Acute kidney failure, unspecified: Secondary | ICD-10-CM | POA: Diagnosis present

## 2022-04-04 DIAGNOSIS — N1831 Chronic kidney disease, stage 3a: Secondary | ICD-10-CM | POA: Diagnosis present

## 2022-04-04 DIAGNOSIS — I2699 Other pulmonary embolism without acute cor pulmonale: Principal | ICD-10-CM | POA: Diagnosis present

## 2022-04-04 DIAGNOSIS — I714 Abdominal aortic aneurysm, without rupture, unspecified: Secondary | ICD-10-CM | POA: Diagnosis present

## 2022-04-04 DIAGNOSIS — F0390 Unspecified dementia without behavioral disturbance: Secondary | ICD-10-CM | POA: Diagnosis not present

## 2022-04-04 DIAGNOSIS — Z7982 Long term (current) use of aspirin: Secondary | ICD-10-CM

## 2022-04-04 DIAGNOSIS — J9601 Acute respiratory failure with hypoxia: Secondary | ICD-10-CM | POA: Diagnosis not present

## 2022-04-04 DIAGNOSIS — I4819 Other persistent atrial fibrillation: Secondary | ICD-10-CM | POA: Diagnosis present

## 2022-04-04 DIAGNOSIS — E785 Hyperlipidemia, unspecified: Secondary | ICD-10-CM | POA: Diagnosis present

## 2022-04-04 DIAGNOSIS — G9341 Metabolic encephalopathy: Secondary | ICD-10-CM | POA: Diagnosis present

## 2022-04-04 DIAGNOSIS — R109 Unspecified abdominal pain: Secondary | ICD-10-CM | POA: Diagnosis not present

## 2022-04-04 DIAGNOSIS — I482 Chronic atrial fibrillation, unspecified: Secondary | ICD-10-CM | POA: Diagnosis present

## 2022-04-04 DIAGNOSIS — R0902 Hypoxemia: Secondary | ICD-10-CM | POA: Diagnosis not present

## 2022-04-04 DIAGNOSIS — I959 Hypotension, unspecified: Secondary | ICD-10-CM | POA: Diagnosis not present

## 2022-04-04 DIAGNOSIS — I5022 Chronic systolic (congestive) heart failure: Secondary | ICD-10-CM | POA: Diagnosis not present

## 2022-04-04 DIAGNOSIS — R410 Disorientation, unspecified: Secondary | ICD-10-CM

## 2022-04-04 DIAGNOSIS — R29818 Other symptoms and signs involving the nervous system: Secondary | ICD-10-CM | POA: Diagnosis not present

## 2022-04-04 DIAGNOSIS — D539 Nutritional anemia, unspecified: Secondary | ICD-10-CM | POA: Diagnosis present

## 2022-04-04 DIAGNOSIS — R404 Transient alteration of awareness: Secondary | ICD-10-CM | POA: Diagnosis not present

## 2022-04-04 DIAGNOSIS — I130 Hypertensive heart and chronic kidney disease with heart failure and stage 1 through stage 4 chronic kidney disease, or unspecified chronic kidney disease: Secondary | ICD-10-CM | POA: Diagnosis not present

## 2022-04-04 DIAGNOSIS — R000 Tachycardia, unspecified: Secondary | ICD-10-CM | POA: Diagnosis not present

## 2022-04-04 DIAGNOSIS — Z66 Do not resuscitate: Secondary | ICD-10-CM | POA: Diagnosis present

## 2022-04-04 DIAGNOSIS — R001 Bradycardia, unspecified: Secondary | ICD-10-CM | POA: Diagnosis not present

## 2022-04-04 DIAGNOSIS — R54 Age-related physical debility: Secondary | ICD-10-CM | POA: Diagnosis present

## 2022-04-04 DIAGNOSIS — Z7401 Bed confinement status: Secondary | ICD-10-CM | POA: Diagnosis not present

## 2022-04-04 DIAGNOSIS — Z6821 Body mass index (BMI) 21.0-21.9, adult: Secondary | ICD-10-CM

## 2022-04-04 DIAGNOSIS — Y846 Urinary catheterization as the cause of abnormal reaction of the patient, or of later complication, without mention of misadventure at the time of the procedure: Secondary | ICD-10-CM | POA: Diagnosis not present

## 2022-04-04 DIAGNOSIS — I2489 Other forms of acute ischemic heart disease: Secondary | ICD-10-CM | POA: Diagnosis present

## 2022-04-04 DIAGNOSIS — E43 Unspecified severe protein-calorie malnutrition: Secondary | ICD-10-CM | POA: Diagnosis not present

## 2022-04-04 DIAGNOSIS — E861 Hypovolemia: Secondary | ICD-10-CM | POA: Diagnosis present

## 2022-04-04 DIAGNOSIS — Z993 Dependence on wheelchair: Secondary | ICD-10-CM

## 2022-04-04 DIAGNOSIS — I700 Atherosclerosis of aorta: Secondary | ICD-10-CM | POA: Diagnosis not present

## 2022-04-04 DIAGNOSIS — Z7189 Other specified counseling: Secondary | ICD-10-CM | POA: Diagnosis not present

## 2022-04-04 DIAGNOSIS — G8929 Other chronic pain: Secondary | ICD-10-CM | POA: Diagnosis present

## 2022-04-04 DIAGNOSIS — Z87448 Personal history of other diseases of urinary system: Secondary | ICD-10-CM | POA: Diagnosis not present

## 2022-04-04 DIAGNOSIS — I5021 Acute systolic (congestive) heart failure: Secondary | ICD-10-CM | POA: Diagnosis not present

## 2022-04-04 DIAGNOSIS — Z515 Encounter for palliative care: Secondary | ICD-10-CM

## 2022-04-04 DIAGNOSIS — R0682 Tachypnea, not elsewhere classified: Secondary | ICD-10-CM | POA: Diagnosis not present

## 2022-04-04 DIAGNOSIS — D62 Acute posthemorrhagic anemia: Secondary | ICD-10-CM | POA: Diagnosis not present

## 2022-04-04 DIAGNOSIS — E872 Acidosis, unspecified: Secondary | ICD-10-CM | POA: Diagnosis not present

## 2022-04-04 DIAGNOSIS — R1310 Dysphagia, unspecified: Secondary | ICD-10-CM | POA: Diagnosis not present

## 2022-04-04 DIAGNOSIS — I3139 Other pericardial effusion (noninflammatory): Secondary | ICD-10-CM | POA: Diagnosis not present

## 2022-04-04 DIAGNOSIS — I4891 Unspecified atrial fibrillation: Secondary | ICD-10-CM | POA: Diagnosis not present

## 2022-04-04 DIAGNOSIS — R531 Weakness: Secondary | ICD-10-CM | POA: Diagnosis not present

## 2022-04-04 DIAGNOSIS — G40909 Epilepsy, unspecified, not intractable, without status epilepticus: Secondary | ICD-10-CM | POA: Diagnosis present

## 2022-04-04 DIAGNOSIS — R319 Hematuria, unspecified: Secondary | ICD-10-CM | POA: Diagnosis not present

## 2022-04-04 DIAGNOSIS — I480 Paroxysmal atrial fibrillation: Secondary | ICD-10-CM | POA: Diagnosis not present

## 2022-04-04 LAB — COMPREHENSIVE METABOLIC PANEL
ALT: 19 U/L (ref 0–44)
AST: 38 U/L (ref 15–41)
Albumin: 1.7 g/dL — ABNORMAL LOW (ref 3.5–5.0)
Alkaline Phosphatase: 96 U/L (ref 38–126)
Anion gap: 6 (ref 5–15)
BUN: 60 mg/dL — ABNORMAL HIGH (ref 8–23)
CO2: 25 mmol/L (ref 22–32)
Calcium: 7.7 mg/dL — ABNORMAL LOW (ref 8.9–10.3)
Chloride: 107 mmol/L (ref 98–111)
Creatinine, Ser: 1.81 mg/dL — ABNORMAL HIGH (ref 0.61–1.24)
GFR, Estimated: 35 mL/min — ABNORMAL LOW (ref >60)
Glucose, Bld: 113 mg/dL — ABNORMAL HIGH (ref 70–99)
Potassium: 4.5 mmol/L (ref 3.5–5.1)
Sodium: 138 mmol/L (ref 135–145)
Total Bilirubin: 1 mg/dL (ref 0.3–1.2)
Total Protein: 5.2 g/dL — ABNORMAL LOW (ref 6.5–8.1)

## 2022-04-04 LAB — URINALYSIS, ROUTINE W REFLEX MICROSCOPIC
Bilirubin Urine: NEGATIVE
Glucose, UA: NEGATIVE mg/dL
Ketones, ur: NEGATIVE mg/dL
Leukocytes,Ua: NEGATIVE
Nitrite: NEGATIVE
Protein, ur: >=300 mg/dL — AB
Specific Gravity, Urine: 1.02 (ref 1.005–1.030)
pH: 5 (ref 5.0–8.0)

## 2022-04-04 LAB — CBC WITH DIFFERENTIAL/PLATELET
Abs Immature Granulocytes: 0.04 10*3/uL (ref 0.00–0.07)
Basophils Absolute: 0.1 10*3/uL (ref 0.0–0.1)
Basophils Relative: 1 %
Eosinophils Absolute: 0.2 10*3/uL (ref 0.0–0.5)
Eosinophils Relative: 2 %
HCT: 28.1 % — ABNORMAL LOW (ref 39.0–52.0)
Hemoglobin: 9 g/dL — ABNORMAL LOW (ref 13.0–17.0)
Immature Granulocytes: 1 %
Lymphocytes Relative: 24 %
Lymphs Abs: 1.9 10*3/uL (ref 0.7–4.0)
MCH: 36.9 pg — ABNORMAL HIGH (ref 26.0–34.0)
MCHC: 32 g/dL (ref 30.0–36.0)
MCV: 115.2 fL — ABNORMAL HIGH (ref 80.0–100.0)
Monocytes Absolute: 0.9 10*3/uL (ref 0.1–1.0)
Monocytes Relative: 11 %
Neutro Abs: 5 10*3/uL (ref 1.7–7.7)
Neutrophils Relative %: 61 %
Platelets: 174 10*3/uL (ref 150–400)
RBC: 2.44 MIL/uL — ABNORMAL LOW (ref 4.22–5.81)
RDW: 15.4 % (ref 11.5–15.5)
WBC: 8 10*3/uL (ref 4.0–10.5)
nRBC: 0 % (ref 0.0–0.2)

## 2022-04-04 LAB — RESP PANEL BY RT-PCR (RSV, FLU A&B, COVID)  RVPGX2
Influenza A by PCR: NEGATIVE
Influenza B by PCR: NEGATIVE
Resp Syncytial Virus by PCR: NEGATIVE
SARS Coronavirus 2 by RT PCR: NEGATIVE

## 2022-04-04 LAB — APTT: aPTT: 32 seconds (ref 24–36)

## 2022-04-04 LAB — TROPONIN I (HIGH SENSITIVITY): Troponin I (High Sensitivity): 215 ng/L (ref <18)

## 2022-04-04 LAB — PROTIME-INR
INR: 1.3 — ABNORMAL HIGH (ref 0.8–1.2)
Prothrombin Time: 16.4 seconds — ABNORMAL HIGH (ref 11.4–15.2)

## 2022-04-04 LAB — LACTIC ACID, PLASMA
Lactic Acid, Venous: 2.2 mmol/L (ref 0.5–1.9)
Lactic Acid, Venous: 1.9 mmol/L (ref 0.5–1.9)

## 2022-04-04 MED ORDER — METRONIDAZOLE 500 MG/100ML IV SOLN
500.0000 mg | Freq: Once | INTRAVENOUS | Status: AC
  Administered 2022-04-04: 500 mg via INTRAVENOUS
  Filled 2022-04-04: qty 100

## 2022-04-04 MED ORDER — IOHEXOL 350 MG/ML SOLN
55.0000 mL | Freq: Once | INTRAVENOUS | Status: AC | PRN
  Administered 2022-04-04: 55 mL via INTRAVENOUS

## 2022-04-04 MED ORDER — VANCOMYCIN VARIABLE DOSE PER UNSTABLE RENAL FUNCTION (PHARMACIST DOSING): Status: DC

## 2022-04-04 MED ORDER — SODIUM CHLORIDE 0.9 % IV SOLN: 2.0000 g | INTRAVENOUS | Status: DC

## 2022-04-04 MED ORDER — VANCOMYCIN HCL 1250 MG/250ML IV SOLN
1250.0000 mg | Freq: Once | INTRAVENOUS | Status: AC
  Administered 2022-04-04: 1250 mg via INTRAVENOUS
  Filled 2022-04-04: qty 250

## 2022-04-04 MED ORDER — METOPROLOL TARTRATE 5 MG/5ML IV SOLN: 5.0000 mg | Freq: Once | INTRAVENOUS | Status: DC

## 2022-04-04 MED ORDER — LACTATED RINGERS IV BOLUS (SEPSIS)
1000.0000 mL | Freq: Once | INTRAVENOUS | Status: AC
  Administered 2022-04-04: 1000 mL via INTRAVENOUS

## 2022-04-04 MED ORDER — LACTATED RINGERS IV SOLN: INTRAVENOUS | Status: DC

## 2022-04-04 MED ORDER — SODIUM CHLORIDE 0.9 % IV SOLN
2.0000 g | Freq: Once | INTRAVENOUS | Status: AC
  Administered 2022-04-04: 2 g via INTRAVENOUS
  Filled 2022-04-04: qty 12.5

## 2022-04-04 MED ORDER — METOPROLOL TARTRATE 25 MG PO TABS: 25.0000 mg | ORAL_TABLET | Freq: Four times a day (QID) | ORAL | Status: DC

## 2022-04-04 MED ORDER — VANCOMYCIN HCL IN DEXTROSE 1-5 GM/200ML-% IV SOLN: 1000.0000 mg | Freq: Once | INTRAVENOUS | Status: DC

## 2022-04-04 MED ORDER — HEPARIN BOLUS VIA INFUSION
3000.0000 [IU] | Freq: Once | INTRAVENOUS | Status: AC
  Administered 2022-04-04: 3000 [IU] via INTRAVENOUS
  Filled 2022-04-04: qty 3000

## 2022-04-04 MED ORDER — HEPARIN (PORCINE) 25000 UT/250ML-% IV SOLN
1150.0000 [IU]/h | INTRAVENOUS | Status: DC
  Administered 2022-04-04: 1100 [IU]/h via INTRAVENOUS
  Administered 2022-04-05 – 2022-04-06 (×2): 1150 [IU]/h via INTRAVENOUS
  Filled 2022-04-04 (×3): qty 250

## 2022-04-04 NOTE — Progress Notes (Signed)
Elink following for Sepsis Protocol 

## 2022-04-04 NOTE — ED Triage Notes (Signed)
Patient arrives via ems from home secondary to feeling weak x 2 weeks.per ems initial bp was 70/40 with hr 155-140. Administered 100 nacl and 1 l LR, bp rebounded to 104/73 . Bgl 177

## 2022-04-04 NOTE — ED Provider Notes (Signed)
[facility] Provider Note   CSN: [id number] Arrival date & time: 04/04/22  1933     History  Chief Complaint  Patient presents with   Hypotension    Tom Ortiz is a 89 y.o. male hx of A-fib not anticoagulated, previous STEMI managed medically, here presenting with hypotension and altered mental status.  Patient lives at home with her son.  Patient has been progressively more confused over the last week or so.  Family noticed that he has intermittent confusion and some trouble speaking since yesterday.  Patient also has some shortness of breath and nonproductive cough as well.  Patient was noted to be hypotensive with a blood pressure in the 70s per EMS.  Patient was also noted to be tachycardic in the 140s.  The history is provided by the patient.       Home Medications Prior to Admission medications   Medication Sig Start Date End Date Taking? Authorizing Provider  ASPIRIN LOW DOSE 81 MG EC tablet TAKE 1 TABLET BY MOUTH EVERY DAY. SWALLOW WHOLE. 07/22/21   Croitoru, Mihai, MD  atorvastatin (LIPITOR) 40 MG tablet TAKE 1 TABLET BY MOUTH EVERY DAY 07/22/21   Croitoru, Dani Gobble, MD  bismuth subsalicylate (PEPTO BISMOL) 262 MG/15ML suspension Take 30 mLs by mouth every 6 (six) hours as needed for indigestion or diarrhea or loose stools.    [provider]  docusate sodium (COLACE) 100 MG capsule Take 1 capsule (100 mg total) by mouth daily as needed for mild constipation. 07/02/20   Troy Sine, MD  feeding supplement (ENSURE ENLIVE / ENSURE PLUS) LIQD Take 237 mLs by mouth daily as needed (weight gain). 07/02/20   Troy Sine, MD  HYDROcodone-acetaminophen (NORCO/VICODIN) 5-325 MG tablet Take 1-2 tablets by mouth every 6 (six) hours as needed for moderate pain. Patient not taking: Reported on 12/03/2020 07/14/16   Danae Orleans, PA-C  isosorbide mononitrate (IMDUR) 60 MG 24 hr tablet TAKE 1 TABLET BY MOUTH EVERY DAY 07/22/21    Croitoru, Mihai, MD  lisinopril (ZESTRIL) 5 MG tablet TAKE 1 TABLET BY MOUTH EVERY DAY 07/22/21   Croitoru, Mihai, MD  LORazepam (ATIVAN) 0.5 MG tablet Take 0.5 mg by mouth at bedtime as needed for sleep.  Patient not taking: Reported on 12/03/2020 07/07/16   [provider]  MELATONIN PO Take 15-20 mg by mouth 2 (two) times daily as needed (sleep). Patient not taking: Reported on 12/03/2020    [provider]  metoprolol succinate (TOPROL-XL) 50 MG 24 hr tablet TAKE 1 TABLET BY MOUTH EVERY DAY. TAKE WITH OR IMMEDIATELY FOLLOWING A MEAL 07/22/21   Croitoru, Dani Gobble, MD  nitroGLYCERIN (NITROSTAT) 0.4 MG SL tablet Place 1 tablet (0.4 mg total) under the tongue every 5 (five) minutes x 3 doses as needed for chest pain. 07/02/20   Troy Sine, MD  sulfamethoxazole-trimethoprim (BACTRIM DS) 800-160 MG tablet Take 1 tablet by mouth 2 (two) times daily. For 14 days Patient not taking: Reported on 12/03/2020 08/22/20   Deberah Pelton, NP      Allergies    Fosamax [alendronate], Vilazodone hcl, and Quinolones    Review of Systems   Review of Systems  Respiratory:  Positive for shortness of breath.   Psychiatric/Behavioral:  Positive for confusion.   All other systems reviewed and are negative.   Physical Exam Updated Vital Signs BP (!) 165/147   Pulse 100   Temp 98.6 F (37 C) (Rectal)   Resp  20   Ht '5\' 10"'$  (1.778 m)   Wt 67.1 kg   SpO2 93%   BMI 21.22 kg/m  Physical Exam Vitals and nursing note reviewed.  Constitutional:      Comments: Confused  HENT:     Head: Normocephalic.     Nose: Nose normal.     Mouth/Throat:     Mouth: Mucous membranes are dry.  Eyes:     Extraocular Movements: Extraocular movements intact.     Pupils: Pupils are equal, round, and reactive to light.  Cardiovascular:     Rate and Rhythm: Tachycardia present.  Pulmonary:     Comments: Tachypneic and crackles bilateral bases. Abdominal:     General: Abdomen is flat.     Palpations: Abdomen  is soft.  Musculoskeletal:        General: Normal range of motion.     Cervical back: Normal range of motion and neck supple.  Skin:    General: Skin is warm.     Capillary Refill: Capillary refill takes less than 2 seconds.  Neurological:     Comments: Confused and moving all extremities.  No obvious facial droop.  Psychiatric:     Comments: Confused     ED Results / Procedures / Treatments   Labs (all labs ordered are listed, but only abnormal results are displayed) Labs Reviewed  LACTIC ACID, PLASMA - Abnormal; Notable for the following components:      Result Value   Lactic Acid, Venous 2.2 (*)    All other components within normal limits  COMPREHENSIVE METABOLIC PANEL - Abnormal; Notable for the following components:   Glucose, Bld 113 (*)    BUN 60 (*)    Creatinine, Ser 1.81 (*)    Calcium 7.7 (*)    Total Protein 5.2 (*)    Albumin 1.7 (*)    GFR, Estimated 35 (*)    All other components within normal limits  CBC WITH DIFFERENTIAL/PLATELET - Abnormal; Notable for the following components:   RBC 2.44 (*)    Hemoglobin 9.0 (*)    HCT 28.1 (*)    MCV 115.2 (*)    MCH 36.9 (*)    All other components within normal limits  PROTIME-INR - Abnormal; Notable for the following components:   Prothrombin Time 16.4 (*)    INR 1.3 (*)    All other components within normal limits  URINALYSIS, ROUTINE W REFLEX MICROSCOPIC - Abnormal; Notable for the following components:   Color, Urine AMBER (*)    APPearance CLOUDY (*)    Hgb urine dipstick SMALL (*)    Protein, ur >=300 (*)    Bacteria, UA RARE (*)    All other components within normal limits  TROPONIN I (HIGH SENSITIVITY) - Abnormal; Notable for the following components:   Troponin I (High Sensitivity) 215 (*)    All other components within normal limits  RESP PANEL BY RT-PCR (RSV, FLU A&B, COVID)  RVPGX2  CULTURE, BLOOD (ROUTINE X 2)  CULTURE, BLOOD (ROUTINE X 2)  URINE CULTURE  APTT  LACTIC ACID, PLASMA  HEPARIN  LEVEL (UNFRACTIONATED)  VITAMIN B12  FOLATE  TROPONIN I (HIGH SENSITIVITY)    EKG EKG Interpretation  Date/Time:  Saturday April 04 2022 19:47:48 EST Ventricular Rate:  149 PR Interval:    QRS Duration: 129 QT Interval:  339 QTC Calculation: 534 R Axis:   212 Text Interpretation: Wide-QRS tachycardia Nonspecific intraventricular conduction delay rate faster and QRS more wide since previous Confirmed by  Wandra Arthurs ([id number]) on 04/04/2022 7:52:10 PM  Radiology CT Angio Chest PE W and/or Wo Contrast  Addendum Date: 04/04/2022   ADDENDUM REPORT: 04/04/2022 22:44 ADDENDUM: Critical Value/emergent results were called by telephone at the time of interpretation on 04/04/2022 at 10:44 pm to provider Chanee Henrickson , who verbally acknowledged these results. Electronically Signed   By: Brett Fairy M.D.   On: 04/04/2022 22:44   Result Date: 04/04/2022 CLINICAL DATA:  Pulmonary embolism suspected, high probability. Hypotension, stroke suspected. EXAM: CT ANGIOGRAPHY CHEST WITH CONTRAST TECHNIQUE: Multidetector CT imaging of the chest was performed using the standard protocol during bolus administration of intravenous contrast. Multiplanar CT image reconstructions and MIPs were obtained to evaluate the vascular anatomy. RADIATION DOSE REDUCTION: This exam was performed according to the departmental dose-optimization program which includes automated exposure control, adjustment of the mA and/or kV according to patient size and/or use of iterative reconstruction technique. CONTRAST:  73m OMNIPAQUE IOHEXOL 350 MG/ML SOLN COMPARISON:  None Available. FINDINGS: Cardiovascular: The heart is mildly enlarged and there is no pericardial effusion. Multi-vessel coronary artery calcifications are seen. There is atherosclerotic calcification of the aorta without evidence of aneurysm. The pulmonary trunk is normal in caliber. Small lobar, segmental, and subsegmental pulmonary artery filling defects in the left lower lobe  and segmental and subsegmental pulmonary emboli in the right lower lobe. No evidence of right heart strain. Mediastinum/Nodes: No mediastinal, hilar, or axillary lymphadenopathy. The thyroid gland, trachea, and esophagus are within normal limits. Lungs/Pleura: There is a trace left pleural effusion. Bronchial wall thickening is noted bilaterally with patchy atelectasis or infiltrate at the lung bases. No pneumothorax Upper Abdomen: Stones are present within the gallbladder. There is a cyst in the right kidney. No acute abnormality. Musculoskeletal: There are compression deformities at T6, T8, T11, and T12, indeterminate in age. Degenerative changes are present in the thoracic spine. Review of the MIP images confirms the above findings. IMPRESSION: 1. Bilateral lower lobe pulmonary emboli. No evidence of right heart strain. 2. Patchy atelectasis or infiltrate at the lung bases. 3. Trace left pleural effusion. 4. Cholelithiasis. 5. Multilevel compression deformities in the thoracic spine, indeterminate in age. 6. Coronary artery calcifications. 7. Aortic atherosclerosis. Electronically Signed: By: LBrett FairyM.D. On: 04/04/2022 22:41   CT HEAD WO CONTRAST (5MM)  Result Date: 04/04/2022 CLINICAL DATA:  Acute neurologic deficit EXAM: CT HEAD WITHOUT CONTRAST TECHNIQUE: Contiguous axial images were obtained from the base of the skull through the vertex without intravenous contrast. RADIATION DOSE REDUCTION: This exam was performed according to the departmental dose-optimization program which includes automated exposure control, adjustment of the mA and/or kV according to patient size and/or use of iterative reconstruction technique. COMPARISON:  None Available. FINDINGS: Brain: There is no mass, hemorrhage or extra-axial collection. There is generalized atrophy without lobar predilection. Hypodensity of the white matter is most commonly associated with chronic microvascular disease. Vascular: Atherosclerotic  calcification of the vertebral and internal carotid arteries at the skull base. No abnormal hyperdensity of the major intracranial arteries or dural venous sinuses. Skull: The visualized skull base, calvarium and extracranial soft tissues are normal. Sinuses/Orbits: No fluid levels or advanced mucosal thickening of the visualized paranasal sinuses. No mastoid or middle ear effusion. The orbits are normal. IMPRESSION: 1. No acute intracranial abnormality. 2. Generalized atrophy and findings of chronic microvascular disease. Electronically Signed   By: KUlyses JarredM.D.   On: 04/04/2022 22:36   DG Chest Port 1 View  Result Date: 04/04/2022 CLINICAL DATA:  Possible sepsis.  Weakness for 2 weeks. EXAM: PORTABLE CHEST 1 VIEW COMPARISON:  07/11/2016. FINDINGS: The heart size and mediastinal contours are stable. There is atherosclerotic calcification of the aorta. Lung volumes are low. There is patchy airspace disease at the left lung base. No effusion or pneumothorax. A stable compression deformity is present in the mid thoracic spine. No acute osseous abnormality. IMPRESSION: Patchy atelectasis or infiltrate at the left lung base. Electronically Signed   By: Brett Fairy M.D.   On: 04/04/2022 20:08    Procedures Procedures    CRITICAL CARE Performed by: Wandra Arthurs   Total critical care time: 30 minutes  Critical care time was exclusive of separately billable procedures and treating other patients.  Critical care was necessary to treat or prevent imminent or life-threatening deterioration.  Critical care was time spent personally by me on the following activities: development of treatment plan with patient and/or surrogate as well as nursing, discussions with consultants, evaluation of patient's response to treatment, examination of patient, obtaining history from patient or surrogate, ordering and performing treatments and interventions, ordering and review of laboratory studies, ordering and  review of radiographic studies, pulse oximetry and re-evaluation of patient's condition.   Medications Ordered in ED Medications  lactated ringers infusion ( Intravenous New Bag/Given 04/04/22 2017)  ceFEPIme (MAXIPIME) 2 g in sodium chloride 0.9 % 100 mL IVPB (has no administration in time range)  vancomycin variable dose per unstable renal function (pharmacist dosing) (has no administration in time range)  metoprolol tartrate (LOPRESSOR) injection 5 mg (5 mg Intravenous Not Given 04/04/22 2301)  metoprolol tartrate (LOPRESSOR) tablet 25 mg (25 mg Oral Not Given 04/04/22 2301)  heparin ADULT infusion 100 units/mL (25000 units/22m) (1,100 Units/hr Intravenous New Bag/Given 04/04/22 2307)  lactated ringers bolus 1,000 mL (0 mLs Intravenous Stopped 04/04/22 2249)  ceFEPIme (MAXIPIME) 2 g in sodium chloride 0.9 % 100 mL IVPB (0 g Intravenous Stopped 04/04/22 2058)  metroNIDAZOLE (FLAGYL) IVPB 500 mg (0 mg Intravenous Stopped 04/04/22 2249)  vancomycin (VANCOREADY) IVPB 1250 mg/250 mL (0 mg Intravenous Stopped 04/04/22 2250)  iohexol (OMNIPAQUE) 350 MG/ML injection 55 mL (55 mLs Intravenous Contrast Given 04/04/22 2224)  heparin bolus via infusion 3,000 Units (3,000 Units Intravenous Bolus from Bag 04/04/22 2308)    ED Course/ Medical Decision Making/ A&P                             Medical Decision Making ABO TEICHERis a 89y.o. male here presenting with confusion and shortness of breath and hypotension.  Due to the hypotension and tachycardia, initial concern was possible sepsis.  Will activate code sepsis and will get CBC and CMP and lactate and cultures and chest x-ray.  10 pm Patient Desatting to the 881s Remains in rapid A-fib heart rate of the 130s.  Patient appears to have a wide-complex tachycardia.  He is not anticoagulated so at risk for PE.  Will get CTA chest.  11:25 PM CTA chest showed bilateral PEs with no heart strain.  Patient troponin is elevated at 200.  His previous  troponin was 800.  I started patient on heparin drip.  I also consulted cardiology (Dr. UVickki Muff.  He recommends metoprolol as needed and he will see the patient for possible NSTEMI and regarding his rapid A-fib.  Hospitalist to admit.   Problems Addressed: [place] acute illness or [place] Confusion: acute illness or injury Elevated troponin:  acute illness or injury Other acute pulmonary embolism without acute cor pulmonale ([place]): acute illness or injury  Amount and/or Complexity of Data Reviewed Labs: ordered. Decision-making details documented in ED Course. Radiology: ordered and independent interpretation performed. Decision-making details documented in ED Course. ECG/medicine tests: ordered and independent interpretation performed. Decision-making details documented in ED Course.  Risk Prescription drug management. Decision regarding hospitalization.    Final Clinical Impression(s) / ED Diagnoses Final diagnoses:  None    Rx / DC Orders ED Discharge Orders     None         Drenda Freeze, MD 04/04/22 2327

## 2022-04-04 NOTE — H&P (Signed)
PCP:   Pcp, No   Chief Complaint:  Altered mentation  HPI: This is a 89 year old male, scented with hypotension and altered mental status.  Patient unable to provide any history secondary to his dementia.  Per EDP, patient with progressive confusion, shortness of breath and nonproductive cough.  Hypotensive in the 70s with EMS.  Tachycardic to the 140s in the ER. In ER patient also hypoxic. In the ER CTA positive for PE.  Patient in A-fib with RVR.  Hospitalist accepted.  Review of Systems:  Unable to obtain due to patient's dementia.  Past Medical History: Past Medical History:  Diagnosis Date   Anxiety    Arthritis    Essential hypertension 04/16/2014   Hard of hearing    tinnitus both ears   Hypertension    Paroxysmal atrial fibrillation ([place]) 04/28/2014   Shoulder pain, left 07/14/2016   states left rotator pain preop   Past Surgical History:  Procedure Laterality Date   APPENDECTOMY  yrs ago   [place]  yrs ago   HIP CLOSED REDUCTION  07/01/2011, left   Procedure: [place];  Surgeon: Magnus Sinning, MD;  Location: [facility];  Service: Orthopedics;  Laterality: Left;   HIP CLOSED REDUCTION  06/11/2011   Procedure: CLOSED MANIPULATION HIP;  Surgeon: Magnus Sinning, MD;  Location: [facility];  Service: Orthopedics;  Laterality: Left;   HIP CLOSED REDUCTION  07/01/2011   Procedure: CLOSED MANIPULATION HIP;  Surgeon: Magnus Sinning, MD;  Location: [facility];  Service: Orthopedics;  Laterality: Left;   HIP CLOSED REDUCTION  12/08/2011   Procedure: CLOSED MANIPULATION HIP;  Surgeon: Sydnee Cabal, MD;  Location: [facility];  Service: Orthopedics;  Laterality: Left;   JOINT REPLACEMENT  2007 right, left 17 yrs ago   Bilateral hip   NM MYOVIEW LTD  2012   ORIF PERIPROSTHETIC FRACTURE Left 07/14/2016   Procedure: OPEN REDUCTION INTERNAL FIXATION (ORIF) PERIPROSTHETIC FRACTURE LEFT HIP;  Surgeon: Paralee Cancel, MD;  Location: [facility];  Service: Orthopedics;  Laterality: Left;    TONSILLECTOMY  as child   TOTAL HIP REVISION  10/02/2011   Procedure: TOTAL HIP REVISION;  Surgeon: Gearlean Alf, MD;  Location: [facility];  Service: Orthopedics;  Laterality: Right;   TOTAL HIP REVISION  01/27/2012   Procedure: TOTAL HIP REVISION;  Surgeon: Gearlean Alf, MD;  Location: [facility];  Service: Orthopedics;  Laterality: Left;    Medications: Prior to Admission medications   Medication Sig Start Date End Date Taking? Authorizing Provider  ASPIRIN LOW DOSE 81 MG EC tablet TAKE 1 TABLET BY MOUTH EVERY DAY. SWALLOW WHOLE. 07/22/21   Croitoru, Mihai, MD  atorvastatin (LIPITOR) 40 MG tablet TAKE 1 TABLET BY MOUTH EVERY DAY 07/22/21   Croitoru, Dani Gobble, MD  bismuth subsalicylate (PEPTO BISMOL) 262 MG/15ML suspension Take 30 mLs by mouth every 6 (six) hours as needed for indigestion or diarrhea or loose stools.    [provider]  docusate sodium (COLACE) 100 MG capsule Take 1 capsule (100 mg total) by mouth daily as needed for mild constipation. 07/02/20   Troy Sine, MD  feeding supplement (ENSURE ENLIVE / ENSURE PLUS) LIQD Take 237 mLs by mouth daily as needed (weight gain). 07/02/20   Troy Sine, MD  HYDROcodone-acetaminophen (NORCO/VICODIN) 5-325 MG tablet Take 1-2 tablets by mouth every 6 (six) hours as needed for moderate pain. Patient not taking: Reported on 12/03/2020 07/14/16   Danae Orleans, PA-C  isosorbide mononitrate (IMDUR) 60 MG  24 hr tablet TAKE 1 TABLET BY MOUTH EVERY DAY 07/22/21   Croitoru, Mihai, MD  lisinopril (ZESTRIL) 5 MG tablet TAKE 1 TABLET BY MOUTH EVERY DAY 07/22/21   Croitoru, Mihai, MD  LORazepam (ATIVAN) 0.5 MG tablet Take 0.5 mg by mouth at bedtime as needed for sleep.  Patient not taking: Reported on 12/03/2020 07/07/16   [provider]  MELATONIN PO Take 15-20 mg by mouth 2 (two) times daily as needed (sleep). Patient not taking: Reported on 12/03/2020    [provider]  metoprolol succinate (TOPROL-XL) 50 MG 24 hr tablet TAKE 1  TABLET BY MOUTH EVERY DAY. TAKE WITH OR IMMEDIATELY FOLLOWING A MEAL 07/22/21   Croitoru, Dani Gobble, MD  nitroGLYCERIN (NITROSTAT) 0.4 MG SL tablet Place 1 tablet (0.4 mg total) under the tongue every 5 (five) minutes x 3 doses as needed for chest pain. 07/02/20   Troy Sine, MD  sulfamethoxazole-trimethoprim (BACTRIM DS) 800-160 MG tablet Take 1 tablet by mouth 2 (two) times daily. For 14 days Patient not taking: Reported on 12/03/2020 08/22/20   Deberah Pelton, NP    Allergies:   Allergies  Allergen Reactions   Fosamax [Alendronate]     Other reaction(s): Unknown   Vilazodone Hcl     Other reaction(s): nausea   Quinolones Hives and Rash    89 yr old allergy to quinine    Social History:  reports that he quit smoking about 28 years ago. His smoking use included cigarettes. He started smoking about 48 years ago. He has a 10.00 pack-year smoking history. He quit smokeless tobacco use about 60 years ago. He reports that he does not drink alcohol and does not use drugs.  Family History: No family history on file.  Physical Exam: Vitals:   04/04/22 1945 04/04/22 2000 04/04/22 2015 04/04/22 2142  BP: (!) 97/58 (!) 137/108 (!) 165/147   Pulse: (!) 53 (!) 125 (!) 108   Resp: (!) 23 20 (!) 28   Temp:      TempSrc:      SpO2: (!) 86% (!) 87% (!) 85%   Weight:    67.1 kg  Height:    '5\' 10"'$  (1.778 m)    General:  Alert and oriented times 1, well developed and nourished, no acute distress, chronically ill appearing gentleman Eyes: PERRLA, pink conjunctiva, no scleral icterus ENT: Moist oral mucosa, neck supple, no thyromegaly Lungs: clear to ascultation, no wheeze, no crackles, no use of accessory muscles Cardiovascular: regular rate and rhythm, no regurgitation, no gallops, no murmurs. No carotid bruits, no JVD Abdomen: soft, positive BS, non-tender, non-distended, no organomegaly, not an acute abdomen GU: indwelling foley Neuro: CN II - XII grossly intact, sensation  intact Musculoskeletal: strength 5/5 all extremities, no clubbing, cyanosis or edema Skin: no rash, no subcutaneous crepitation, no decubitus, bruising on arms Psych: appropriate patient   Labs on Admission:  Recent Labs    04/04/22 1946  NA 138  K 4.5  CL 107  CO2 25  GLUCOSE 113*  BUN 60*  CREATININE 1.81*  CALCIUM 7.7*   Recent Labs    04/04/22 1946  AST 38  ALT 19  ALKPHOS 96  BILITOT 1.0  PROT 5.2*  ALBUMIN 1.7*    Recent Labs    04/04/22 1946  WBC 8.0  NEUTROABS 5.0  HGB 9.0*  HCT 28.1*  MCV 115.2*  PLT 174    Micro Results: Recent Results (from the past 240 hour(s))  Resp panel by RT-PCR (  RSV, Flu A&B, Covid) Anterior Nasal Swab     Status: None   Collection Time: 04/04/22  8:26 PM   Specimen: Anterior Nasal Swab  Result Value Ref Range Status   SARS Coronavirus 2 by RT PCR NEGATIVE NEGATIVE Final    Comment: (NOTE) SARS-CoV-2 target nucleic acids are NOT DETECTED.  The SARS-CoV-2 RNA is generally detectable in upper respiratory specimens during the acute phase of infection. The lowest concentration of SARS-CoV-2 viral copies this assay can detect is 138 copies/mL. A negative result does not preclude SARS-Cov-2 infection and should not be used as the sole basis for treatment or other patient management decisions. A negative result may occur with  improper specimen collection/handling, submission of specimen other than nasopharyngeal swab, presence of viral mutation(s) within the areas targeted by this assay, and inadequate number of viral copies(<138 copies/mL). A negative result must be combined with clinical observations, patient history, and epidemiological information. The expected result is Negative.  Fact Sheet for Patients:  EntrepreneurPulse.com.au  Fact Sheet for Healthcare Providers:  IncredibleEmployment.be  This test is no t yet approved or cleared by the [place] FDA and  has been  authorized for detection and/or diagnosis of SARS-CoV-2 by FDA under an Emergency Use Authorization (EUA). This EUA will remain  in effect (meaning this test can be used) for the duration of the COVID-19 declaration under Section 564(b)(1) of the Act, 21 U.S.C.section 360bbb-3(b)(1), unless the authorization is terminated  or revoked sooner.       Influenza A by PCR NEGATIVE NEGATIVE Final   Influenza B by PCR NEGATIVE NEGATIVE Final    Comment: (NOTE) The Xpert Xpress SARS-CoV-2/FLU/RSV plus assay is intended as an aid in the diagnosis of influenza from Nasopharyngeal swab specimens and should not be used as a sole basis for treatment. Nasal washings and aspirates are unacceptable for Xpert Xpress SARS-CoV-2/FLU/RSV testing.  Fact Sheet for Patients: EntrepreneurPulse.com.au  Fact Sheet for Healthcare Providers: IncredibleEmployment.be  This test is not yet approved or cleared by the [place] FDA and has been authorized for detection and/or diagnosis of SARS-CoV-2 by FDA under an Emergency Use Authorization (EUA). This EUA will remain in effect (meaning this test can be used) for the duration of the COVID-19 declaration under Section 564(b)(1) of the Act, 21 U.S.C. section 360bbb-3(b)(1), unless the authorization is terminated or revoked.     Resp Syncytial Virus by PCR NEGATIVE NEGATIVE Final    Comment: (NOTE) Fact Sheet for Patients: EntrepreneurPulse.com.au  Fact Sheet for Healthcare Providers: IncredibleEmployment.be  This test is not yet approved or cleared by the [place] FDA and has been authorized for detection and/or diagnosis of SARS-CoV-2 by FDA under an Emergency Use Authorization (EUA). This EUA will remain in effect (meaning this test can be used) for the duration of the COVID-19 declaration under Section 564(b)(1) of the Act, 21 U.S.C. section 360bbb-3(b)(1), unless the  authorization is terminated or revoked.  Performed at [facility], [place] [address], [place], [place] [address]      Radiological Exams on Admission: CT Angio Chest PE W and/or Wo Contrast  Addendum Date: 04/04/2022   ADDENDUM REPORT: 04/04/2022 22:44 ADDENDUM: Critical Value/emergent results were called by telephone at the time of interpretation on 04/04/2022 at 10:44 pm to provider DAVID YAO , who verbally acknowledged these results. Electronically Signed   By: Brett Fairy M.D.   On: 04/04/2022 22:44   Result Date: 04/04/2022 CLINICAL DATA:  Pulmonary embolism suspected, high probability. Hypotension, stroke suspected.  EXAM: CT ANGIOGRAPHY CHEST WITH CONTRAST TECHNIQUE: Multidetector CT imaging of the chest was performed using the standard protocol during bolus administration of intravenous contrast. Multiplanar CT image reconstructions and MIPs were obtained to evaluate the vascular anatomy. RADIATION DOSE REDUCTION: This exam was performed according to the departmental dose-optimization program which includes automated exposure control, adjustment of the mA and/or kV according to patient size and/or use of iterative reconstruction technique. CONTRAST:  39m OMNIPAQUE IOHEXOL 350 MG/ML SOLN COMPARISON:  None Available. FINDINGS: Cardiovascular: The heart is mildly enlarged and there is no pericardial effusion. Multi-vessel coronary artery calcifications are seen. There is atherosclerotic calcification of the aorta without evidence of aneurysm. The pulmonary trunk is normal in caliber. Small lobar, segmental, and subsegmental pulmonary artery filling defects in the left lower lobe and segmental and subsegmental pulmonary emboli in the right lower lobe. No evidence of right heart strain. Mediastinum/Nodes: No mediastinal, hilar, or axillary lymphadenopathy. The thyroid gland, trachea, and esophagus are within normal limits. Lungs/Pleura: There is a trace left pleural effusion. Bronchial wall  thickening is noted bilaterally with patchy atelectasis or infiltrate at the lung bases. No pneumothorax Upper Abdomen: Stones are present within the gallbladder. There is a cyst in the right kidney. No acute abnormality. Musculoskeletal: There are compression deformities at T6, T8, T11, and T12, indeterminate in age. Degenerative changes are present in the thoracic spine. Review of the MIP images confirms the above findings. IMPRESSION: 1. Bilateral lower lobe pulmonary emboli. No evidence of right heart strain. 2. Patchy atelectasis or infiltrate at the lung bases. 3. Trace left pleural effusion. 4. Cholelithiasis. 5. Multilevel compression deformities in the thoracic spine, indeterminate in age. 6. Coronary artery calcifications. 7. Aortic atherosclerosis. Electronically Signed: By: LBrett FairyM.D. On: 04/04/2022 22:41   CT HEAD WO CONTRAST (5MM)  Result Date: 04/04/2022 CLINICAL DATA:  Acute neurologic deficit EXAM: CT HEAD WITHOUT CONTRAST TECHNIQUE: Contiguous axial images were obtained from the base of the skull through the vertex without intravenous contrast. RADIATION DOSE REDUCTION: This exam was performed according to the departmental dose-optimization program which includes automated exposure control, adjustment of the mA and/or kV according to patient size and/or use of iterative reconstruction technique. COMPARISON:  None Available. FINDINGS: Brain: There is no mass, hemorrhage or extra-axial collection. There is generalized atrophy without lobar predilection. Hypodensity of the white matter is most commonly associated with chronic microvascular disease. Vascular: Atherosclerotic calcification of the vertebral and internal carotid arteries at the skull base. No abnormal hyperdensity of the major intracranial arteries or dural venous sinuses. Skull: The visualized skull base, calvarium and extracranial soft tissues are normal. Sinuses/Orbits: No fluid levels or advanced mucosal thickening of the  visualized paranasal sinuses. No mastoid or middle ear effusion. The orbits are normal. IMPRESSION: 1. No acute intracranial abnormality. 2. Generalized atrophy and findings of chronic microvascular disease. Electronically Signed   By: KUlyses JarredM.D.   On: 04/04/2022 22:36   DG Chest Port 1 View  Result Date: 04/04/2022 CLINICAL DATA:  Possible sepsis.  Weakness for 2 weeks. EXAM: PORTABLE CHEST 1 VIEW COMPARISON:  07/11/2016. FINDINGS: The heart size and mediastinal contours are stable. There is atherosclerotic calcification of the aorta. Lung volumes are low. There is patchy airspace disease at the left lung base. No effusion or pneumothorax. A stable compression deformity is present in the mid thoracic spine. No acute osseous abnormality. IMPRESSION: Patchy atelectasis or infiltrate at the left lung base. Electronically Signed   By: LRegan RakersD.  On: 04/04/2022 20:08    Assessment/Plan Present on Admission:  Chronic atrial fibrillation with RVR (HCC) -Admit to cardiac telemetry -Metoprolol 25 mg p.o. twice daily ordered -At baseline: Not considered a candidate for DOAC due to age, anemia, and frail state. Patient placed on heparin drip in the acute situation, PE plus atrial fibrillation.    Acute pulmonary embolism (HCC)  Acute respiratory failure with hypoxia (HCC), 2L oxygen -Oxygen to keep sats greater than 88% -Heparin drip -Lower extremity Dopplers -2D echo   Lactic acid acidosis -Likely secondary to above.  Follow LE curve until normalized   AKI (acute kidney injury) ([place]) -Gentle IV fluid hydration.  Patient's EF 30 to 35% -BMP in a.m.   Elevated troponin -Likely secondary to PE and A-fib with RVR. -Cardiology contacted by EDP -Troponin already trending down, 215=> 199   Delirium with dementia -Delirium may be due to above.  No clear evidence of infection.  Treat underlying issues. -Resume patient's Nam[place]   Hyperlipidemia -Resume as patient  Lipitor  Seizures -Resume patient's Keppra   HTN (hypertension) -Metoprolol restarted.  Macrocytic anemia -Vitamin B12, folate levels ordered for a.m.  CAD/history of systolic heart failure -Lisinopril on hold with AKI. -Imdur, atorvastatin, metoprolol, ASA resumed -?  Ongoing diuretic  Closed intertrochanteric fracture of left hip with delayed healing -Failed total hip arthroplasty -Gait instablity on wheelchair   Letina Luckett 04/04/2022, 11:07 PM

## 2022-04-04 NOTE — Progress Notes (Signed)
Pharmacy Antibiotic Note  Tom Ortiz is a 89 y.o. male for which pharmacy has been consulted for cefepime and vancomycin dosing for sepsis.  Patient with a history of HTN, AF. Patient presenting with weakness x 2 weeks.  SCr 1.81 AKI WBC 8; LA 2.2; T 98.6; HR 108; RR 28 COVID neg / flu neg  Plan: Metronidazole per MD Cefepime 2g q24hr Vancomycin 1250 mg once, subsequent dosing as indicated per random vancomycin level until renal function stable and/or improved, at which time scheduled dosing can be considered Trend WBC, Fever, Renal function F/u cultures, clinical course, WBC De-escalate when able     Temp (24hrs), Avg:98.6 F (37 C), Min:98.6 F (37 C), Max:98.6 F (37 C)  No results for input(s): "WBC", "CREATININE", "LATICACIDVEN", "VANCOTROUGH", "VANCOPEAK", "VANCORANDOM", "GENTTROUGH", "GENTPEAK", "GENTRANDOM", "TOBRATROUGH", "TOBRAPEAK", "TOBRARND", "AMIKACINPEAK", "AMIKACINTROU", "AMIKACIN" in the last 168 hours.  CrCl cannot be calculated (Patient's most recent lab result is older than the maximum 21 days allowed.).    Allergies  Allergen Reactions   Fosamax [Alendronate]     Other reaction(s): Unknown   Vilazodone Hcl     Other reaction(s): nausea   Quinolones Hives and Rash    89 yr old allergy to quinine   Antimicrobials this admission: vancomycin 1/20 >>  cefepime 1/20 >>  flagyl 1/20 >>   Microbiology results: Pending  Thank you for allowing pharmacy to be a part of this patient's care.  Lorelei Pont, PharmD, BCPS 04/04/2022 7:49 PM ED Clinical Pharmacist -  [phone]

## 2022-04-04 NOTE — Progress Notes (Signed)
ANTICOAGULATION CONSULT NOTE - Initial Consult  Pharmacy Consult for heparin Indication: pulmonary embolus  Allergies  Allergen Reactions   Fosamax [Alendronate]     Other reaction(s): Unknown   Vilazodone Hcl     Other reaction(s): nausea   Quinolones Hives and Rash    89 yr old allergy to quinine    Patient Measurements: Height: '5\' 10"'$  (177.8 cm) Weight: 67.1 kg (147 lb 14.4 oz) IBW/kg (Calculated) : 73  Vital Signs: Temp: 98.6 F (37 C) (01/20 1943) Temp Source: Rectal (01/20 1943) BP: 165/147 (01/20 2015) Pulse Rate: 108 (01/20 2015)  Labs: Recent Labs    04/04/22 1946  HGB 9.0*  HCT 28.1*  PLT 174  APTT 32  LABPROT 16.4*  INR 1.3*  CREATININE 1.81*  TROPONINIHS 215*    Estimated Creatinine Clearance: 24.7 mL/min (A) (by C-G formula based on SCr of 1.81 mg/dL (H)).   Medical History: Past Medical History:  Diagnosis Date   Anxiety    Arthritis    Essential hypertension 04/16/2014   Hard of hearing    tinnitus both ears   Hypertension    Paroxysmal atrial fibrillation ([place]) 04/28/2014   Shoulder pain, left 07/14/2016   states left rotator pain preop    Assessment: 89yo male c/o weakness, EMS found pt to be hypotensive and tachycardic which improved with fluids, started on ABX for sepsis, CTA chest reveals bilateral PE and atelectasis vs infiltrate >> to begin heparin.  Pt has known h/o PAF but not currently on [place] PTA.  Goal of Therapy:  Heparin level 0.3-0.7 units/ml Monitor platelets by anticoagulation protocol: Yes   Plan:  Heparin 3000 units IV bolus x1 followed by infusion at 1100 units/hr. Monitor heparin levels and CBC.  Wynona Neat, PharmD, BCPS  04/04/2022,10:51 PM

## 2022-04-05 ENCOUNTER — Inpatient Hospital Stay (HOSPITAL_COMMUNITY): Payer: Medicare Other

## 2022-04-05 ENCOUNTER — Other Ambulatory Visit (HOSPITAL_COMMUNITY): Payer: Medicare Other

## 2022-04-05 ENCOUNTER — Encounter (HOSPITAL_COMMUNITY): Payer: Self-pay | Admitting: Family Medicine

## 2022-04-05 ENCOUNTER — Other Ambulatory Visit: Payer: Self-pay

## 2022-04-05 DIAGNOSIS — I5021 Acute systolic (congestive) heart failure: Secondary | ICD-10-CM

## 2022-04-05 DIAGNOSIS — R7989 Other specified abnormal findings of blood chemistry: Secondary | ICD-10-CM

## 2022-04-05 DIAGNOSIS — I4891 Unspecified atrial fibrillation: Secondary | ICD-10-CM

## 2022-04-05 DIAGNOSIS — N179 Acute kidney failure, unspecified: Secondary | ICD-10-CM | POA: Diagnosis not present

## 2022-04-05 DIAGNOSIS — I3139 Other pericardial effusion (noninflammatory): Secondary | ICD-10-CM

## 2022-04-05 DIAGNOSIS — I482 Chronic atrial fibrillation, unspecified: Secondary | ICD-10-CM

## 2022-04-05 DIAGNOSIS — I2699 Other pulmonary embolism without acute cor pulmonale: Secondary | ICD-10-CM

## 2022-04-05 LAB — BASIC METABOLIC PANEL
Anion gap: 9 (ref 5–15)
BUN: 58 mg/dL — ABNORMAL HIGH (ref 8–23)
CO2: 23 mmol/L (ref 22–32)
Calcium: 7.4 mg/dL — ABNORMAL LOW (ref 8.9–10.3)
Chloride: 105 mmol/L (ref 98–111)
Creatinine, Ser: 1.68 mg/dL — ABNORMAL HIGH (ref 0.61–1.24)
GFR, Estimated: 38 mL/min — ABNORMAL LOW (ref >60)
Glucose, Bld: 112 mg/dL — ABNORMAL HIGH (ref 70–99)
Potassium: 4.3 mmol/L (ref 3.5–5.1)
Sodium: 137 mmol/L (ref 135–145)

## 2022-04-05 LAB — TSH: TSH: 1.091 u[IU]/mL (ref 0.350–4.500)

## 2022-04-05 LAB — ECHOCARDIOGRAM COMPLETE
AR max vel: 1.4 cm2
AV Area VTI: 1.39 cm2
AV Area mean vel: 1.43 cm2
AV Mean grad: 7.4 mmHg
AV Peak grad: 14.5 mmHg
Ao pk vel: 1.9 m/s
Area-P 1/2: 3.42 cm2
Calc EF: 52.8 %
Height: 70 in
MV M vel: 2.6 m/s
MV Peak grad: 27 mmHg
P 1/2 time: 296 msec
S' Lateral: 2 cm
Single Plane A2C EF: 55.4 %
Single Plane A4C EF: 54.4 %
Weight: 2366.4 oz

## 2022-04-05 LAB — CBC
HCT: 25.6 % — ABNORMAL LOW (ref 39.0–52.0)
Hemoglobin: 8.4 g/dL — ABNORMAL LOW (ref 13.0–17.0)
MCH: 37 pg — ABNORMAL HIGH (ref 26.0–34.0)
MCHC: 32.8 g/dL (ref 30.0–36.0)
MCV: 112.8 fL — ABNORMAL HIGH (ref 80.0–100.0)
Platelets: 172 10*3/uL (ref 150–400)
RBC: 2.27 MIL/uL — ABNORMAL LOW (ref 4.22–5.81)
RDW: 15.3 % (ref 11.5–15.5)
WBC: 8.5 10*3/uL (ref 4.0–10.5)
nRBC: 0 % (ref 0.0–0.2)

## 2022-04-05 LAB — MAGNESIUM: Magnesium: 2 mg/dL (ref 1.7–2.4)

## 2022-04-05 LAB — VALPROIC ACID LEVEL: Valproic Acid Lvl: <10 ug/mL — ABNORMAL LOW (ref 50.0–100.0)

## 2022-04-05 LAB — HEPARIN LEVEL (UNFRACTIONATED)
Heparin Unfractionated: 0.33 IU/mL (ref 0.30–0.70)
Heparin Unfractionated: 0.51 IU/mL (ref 0.30–0.70)

## 2022-04-05 LAB — TROPONIN I (HIGH SENSITIVITY): Troponin I (High Sensitivity): 199 ng/L (ref <18)

## 2022-04-05 LAB — FOLATE: Folate: 15.7 ng/mL (ref >5.9)

## 2022-04-05 LAB — VITAMIN B12: Vitamin B-12: 306 pg/mL (ref 180–914)

## 2022-04-05 MED ORDER — ISOSORBIDE MONONITRATE ER 30 MG PO TB24
30.0000 mg | ORAL_TABLET | Freq: Every day | ORAL | Status: DC
  Administered 2022-04-05: 30 mg via ORAL
  Filled 2022-04-05: qty 1

## 2022-04-05 MED ORDER — ACETAMINOPHEN 325 MG PO TABS
650.0000 mg | ORAL_TABLET | Freq: Four times a day (QID) | ORAL | Status: DC | PRN
  Administered 2022-04-06 – 2022-04-07 (×2): 650 mg via ORAL
  Filled 2022-04-05 (×3): qty 2

## 2022-04-05 MED ORDER — SODIUM CHLORIDE 0.9 % IV SOLN: INTRAVENOUS | Status: DC

## 2022-04-05 MED ORDER — ACETAMINOPHEN 650 MG RE SUPP: 650.0000 mg | Freq: Four times a day (QID) | RECTAL | Status: DC | PRN

## 2022-04-05 MED ORDER — ONDANSETRON HCL 4 MG PO TABS: 4.0000 mg | ORAL_TABLET | Freq: Four times a day (QID) | ORAL | Status: DC | PRN

## 2022-04-05 MED ORDER — METOPROLOL SUCCINATE ER 25 MG PO TB24: 25.0000 mg | ORAL_TABLET | Freq: Every day | ORAL | Status: DC

## 2022-04-05 MED ORDER — ASPIRIN 81 MG PO TBEC
81.0000 mg | DELAYED_RELEASE_TABLET | Freq: Every day | ORAL | Status: DC
  Administered 2022-04-05 – 2022-04-07 (×3): 81 mg via ORAL
  Filled 2022-04-05 (×3): qty 1

## 2022-04-05 MED ORDER — SODIUM CHLORIDE 0.9 % IV SOLN: INTRAVENOUS | Status: AC

## 2022-04-05 MED ORDER — VITAMIN B-12 1000 MCG PO TABS
500.0000 ug | ORAL_TABLET | Freq: Every day | ORAL | Status: DC
  Administered 2022-04-06 – 2022-04-09 (×4): 500 ug via ORAL
  Filled 2022-04-05 (×5): qty 1

## 2022-04-05 MED ORDER — METOPROLOL SUCCINATE ER 25 MG PO TB24: 50.0000 mg | ORAL_TABLET | Freq: Every day | ORAL | Status: DC

## 2022-04-05 MED ORDER — ATORVASTATIN CALCIUM 10 MG PO TABS
20.0000 mg | ORAL_TABLET | Freq: Every day | ORAL | Status: DC
  Administered 2022-04-05 – 2022-04-09 (×5): 20 mg via ORAL
  Filled 2022-04-05 (×5): qty 2

## 2022-04-05 MED ORDER — SENNOSIDES-DOCUSATE SODIUM 8.6-50 MG PO TABS: 1.0000 | ORAL_TABLET | Freq: Every evening | ORAL | Status: DC | PRN

## 2022-04-05 MED ORDER — SODIUM CHLORIDE 0.9 % IV BOLUS
500.0000 mL | Freq: Once | INTRAVENOUS | Status: AC
  Administered 2022-04-05: 500 mL via INTRAVENOUS

## 2022-04-05 MED ORDER — MAGIC MOUTHWASH
15.0000 mL | Freq: Four times a day (QID) | ORAL | Status: DC | PRN
  Administered 2022-04-05 – 2022-04-09 (×5): 15 mL via ORAL
  Filled 2022-04-05 (×8): qty 15

## 2022-04-05 MED ORDER — MAGNESIUM SULFATE 2 GM/50ML IV SOLN
2.0000 g | Freq: Once | INTRAVENOUS | Status: AC
  Administered 2022-04-05: 2 g via INTRAVENOUS
  Filled 2022-04-05: qty 50

## 2022-04-05 MED ORDER — LEVETIRACETAM 250 MG PO TABS
250.0000 mg | ORAL_TABLET | Freq: Two times a day (BID) | ORAL | Status: DC
  Administered 2022-04-05 – 2022-04-06 (×4): 250 mg via ORAL
  Filled 2022-04-05 (×5): qty 1

## 2022-04-05 MED ORDER — METOPROLOL SUCCINATE ER 25 MG PO TB24
50.0000 mg | ORAL_TABLET | Freq: Every day | ORAL | Status: DC
  Filled 2022-04-05: qty 2

## 2022-04-05 MED ORDER — TAMSULOSIN HCL 0.4 MG PO CAPS
0.4000 mg | ORAL_CAPSULE | Freq: Every day | ORAL | Status: DC
  Administered 2022-04-05 – 2022-04-14 (×9): 0.4 mg via ORAL
  Filled 2022-04-05 (×9): qty 1

## 2022-04-05 MED ORDER — ONDANSETRON HCL 4 MG/2ML IJ SOLN: 4.0000 mg | Freq: Four times a day (QID) | INTRAMUSCULAR | Status: DC | PRN

## 2022-04-05 MED ORDER — MEMANTINE HCL 10 MG PO TABS
5.0000 mg | ORAL_TABLET | Freq: Two times a day (BID) | ORAL | Status: DC
  Administered 2022-04-05 – 2022-04-09 (×10): 5 mg via ORAL
  Filled 2022-04-05 (×12): qty 1

## 2022-04-05 NOTE — Progress Notes (Addendum)
[facility] PROGRESS NOTE   Tom Ortiz:[id number] DOB: 05-14-1927 DOA: 04/04/2022  PCP: Pcp, No  Brief History/Interval Summary: 89 year old Caucasian male with past medical history of dementia, hearing impairment, paroxysmal atrial fibrillation not on anticoagulation who was brought into the emergency department after EMS was called due to history of weakness ongoing for about 2 weeks.  He was noted to be hypotensive and tachycardic when EMS evaluated him.  He was brought into the hospital for further management.  Found to be in rapid atrial fibrillation.  Blood pressure improved after fluid resuscitation.  He was found to have acute pulmonary embolism.  He was hospitalized for further management.  Consultants: Cardiology  Procedures: Echocardiogram    Subjective/Interval History: Patient pleasantly confused.  Denies any pain.  Seems to be comfortable on the bed.     Assessment/Plan:  Atrial fibrillation with RVR with a known history of chronic atrial fibrillation Patient started on metoprolol.  Heart rate has improved after he was given fluids.  Tachyarrhythmia could have been brought on by his acute illness.  He is found to have acute pulmonary embolism.  TSH 1.09 Monitor on telemetry. Cardiology was consulted as well.  Previously not on anticoagulation due to age, anemia, dementia, frail status  Acute pulmonary embolism/acute respiratory failure with hypoxia Currently requiring 2 L of oxygen by nasal cannula. CT angiogram showed pulmonary embolism. Continue with IV heparin for now. Lower extremity Dopplers and echocardiogram is pending. WBC is normal.  No infection identified.  Will discontinue antibiotics.  Hypotension, unspecified Could have been due to hypovolemia as he did have acute kidney injury.  Perhaps has had poor oral intake since he has been weak for 2 weeks.  Blood pressure improved with fluid resuscitation Lactic acid levels improved.  Acute  kidney injury Baseline renal function noted to be normal with creatinine of 1.0.  Came in with creatinine of 1.81.  Improved to 1.68 this morning.  Continue with IV fluids.  Monitor urine output.  Avoid nephrotoxic agents. Will be cautious with fluids due to history of CHF.  Chronic systolic CHF EF based on echo from 2022 is 30 to 35%.  Regional wall motion abnormalities and global hypokinesis was noted at that time. Echocardiogram to be repeated. Due to his cognitive deficits/dementia not a candidate for any aggressive cardiac interventions. Mildly elevated troponin levels are secondary to PE, tachyarrhythmia and hypotension.  Unlikely to be ACS.  History of dementia with some delirium Continue Namenda.  Seizure disorder Continue Keppra.  Macrocytic anemia Baseline hemoglobin seems to be around 10-11.  Came in with hemoglobin of 9.   No evidence for overt bleeding.  Monitor closely while he is getting anticoagulation.  Folic acid level 44.0.  Vitamin B12 306.  Will start B12 supplementation. Check stool for occult blood  History of closed intertrochanteric fracture of the left hip with delayed healing Failed total hip arthroplasty.  He is wheelchair-bound.  Goals of care No family at bedside.  Called patient's son and spoke to him.  Patient's wife also has cognitive impairment so he is the Media planner.  He mentions that patient has been having abdominal issues for a long time.  His appetite has been poor for several weeks.  Plan will be to do a CT scan of the abdomen tomorrow to see if there is any obvious abnormality that we can detect.  It also appears that this abdominal issue has been ongoing for several years though.  He has experienced some weight  loss based on previous records.  His quality of life has been poor according to the son.  In view of all of this he agrees with transition to DNR.  DVT Prophylaxis: On IV heparin Code Status: DNR Family Communication: No family at  bedside Disposition Plan: To be determined  Status is: Inpatient Remains inpatient appropriate because: Acute pulmonary embolism, atrial fibrillation with RVR    Medications: Scheduled:  aspirin EC  81 mg Oral Daily   atorvastatin  20 mg Oral Daily   isosorbide mononitrate  30 mg Oral Daily   levETIRAcetam  250 mg Oral BID   memantine  5 mg Oral BID   tamsulosin  0.4 mg Oral Daily   vancomycin variable dose per unstable renal function (pharmacist dosing)   Does not apply See admin instructions   Continuous:  ceFEPime (MAXIPIME) IV     heparin 1,100 Units/hr (04/05/22 0837)   FBP:ZWCHENIDPOEUM **OR** acetaminophen, ondansetron **OR** ondansetron (ZOFRAN) IV, senna-docusate  Antibiotics: Anti-infectives (From admission, onward)    Start     Dose/Rate Route Frequency Ordered Stop   04/05/22 2000  ceFEPIme (MAXIPIME) 2 g in sodium chloride 0.9 % 100 mL IVPB        2 g 200 mL/hr over 30 Minutes Intravenous Every 24 hours 04/04/22 2145 04/11/22 1959   04/04/22 2145  vancomycin variable dose per unstable renal function (pharmacist dosing)         Does not apply See admin instructions 04/04/22 2145     04/04/22 2000  ceFEPIme (MAXIPIME) 2 g in sodium chloride 0.9 % 100 mL IVPB        2 g 200 mL/hr over 30 Minutes Intravenous  Once 04/04/22 1946 04/04/22 2058   04/04/22 2000  metroNIDAZOLE (FLAGYL) IVPB 500 mg        500 mg 100 mL/hr over 60 Minutes Intravenous  Once 04/04/22 1946 04/04/22 2249   04/04/22 2000  vancomycin (VANCOCIN) IVPB 1000 mg/200 mL premix  Status:  Discontinued        1,000 mg 200 mL/hr over 60 Minutes Intravenous  Once 04/04/22 1946 04/04/22 1949   04/04/22 2000  vancomycin (VANCOREADY) IVPB 1250 mg/250 mL        1,250 mg 166.7 mL/hr over 90 Minutes Intravenous  Once 04/04/22 1949 04/04/22 2250       Objective:  Vital Signs  Vitals:   04/05/22 0645 04/05/22 0715 04/05/22 0745 04/05/22 0800  BP: (!) 115/54 (!) 109/56 108/60 94/60  Pulse: 65 74 (!)  104 72  Resp: 12 (!) '23 17 13  '$ Temp:    97.8 F (36.6 C)  TempSrc:    Axillary  SpO2: 97% 98% 99% 100%  Weight:      Height:        Intake/Output Summary (Last 24 hours) at 04/05/2022 0849 Last data filed at 04/05/2022 0207 Gross per 24 hour  Intake 977.5 ml  Output 100 ml  Net 877.5 ml   Filed Weights   04/04/22 2142  Weight: 67.1 kg    General appearance: Awake but distracted and disoriented.  In no distress Resp: Clear to auscultation bilaterally.  Normal effort Cardio: S1-S2 is irregularly irregular GI: Abdomen is soft.  Nontender nondistended.  Bowel sounds are present normal.  No masses organomegaly Extremities: No edema.  Moving all of his extremities but physical deconditioning is noted Neurologic: Distal oriented.  No focal neurological deficits.    Lab Results:  Data Reviewed: I have personally reviewed following labs and reports of  the imaging studies  CBC: Recent Labs  Lab 04/04/22 1946 04/05/22 0221  WBC 8.0 8.5  NEUTROABS 5.0  --   HGB 9.0* 8.4*  HCT 28.1* 25.6*  MCV 115.2* 112.8*  PLT 174 962    Basic Metabolic Panel: Recent Labs  Lab 04/04/22 1946 04/05/22 0221  NA 138 137  K 4.5 4.3  CL 107 105  CO2 25 23  GLUCOSE 113* 112*  BUN 60* 58*  CREATININE 1.81* 1.68*  CALCIUM 7.7* 7.4*    GFR: Estimated Creatinine Clearance: 26.6 mL/min (A) (by C-G formula based on SCr of 1.68 mg/dL (H)).  Liver Function Tests: Recent Labs  Lab 04/04/22 1946  AST 38  ALT 19  ALKPHOS 96  BILITOT 1.0  PROT 5.2*  ALBUMIN 1.7*     Coagulation Profile: Recent Labs  Lab 04/04/22 1946  INR 1.3*      Thyroid Function Tests: Recent Labs    04/05/22 0221  TSH 1.091    Anemia Panel: Recent Labs    04/05/22 0221  VITAMINB12 306  FOLATE 15.7    Recent Results (from the past 240 hour(s))  Resp panel by RT-PCR (RSV, Flu A&B, Covid) Anterior Nasal Swab     Status: None   Collection Time: 04/04/22  8:26 PM   Specimen: Anterior Nasal Swab   Result Value Ref Range Status   SARS Coronavirus 2 by RT PCR NEGATIVE NEGATIVE Final    Comment: (NOTE) SARS-CoV-2 target nucleic acids are NOT DETECTED.  The SARS-CoV-2 RNA is generally detectable in upper respiratory specimens during the acute phase of infection. The lowest concentration of SARS-CoV-2 viral copies this assay can detect is 138 copies/mL. A negative result does not preclude SARS-Cov-2 infection and should not be used as the sole basis for treatment or other patient management decisions. A negative result may occur with  improper specimen collection/handling, submission of specimen other than nasopharyngeal swab, presence of viral mutation(s) within the areas targeted by this assay, and inadequate number of viral copies(<138 copies/mL). A negative result must be combined with clinical observations, patient history, and epidemiological information. The expected result is Negative.  Fact Sheet for Patients:  EntrepreneurPulse.com.au  Fact Sheet for Healthcare Providers:  IncredibleEmployment.be  This test is no t yet approved or cleared by the [place] FDA and  has been authorized for detection and/or diagnosis of SARS-CoV-2 by FDA under an Emergency Use Authorization (EUA). This EUA will remain  in effect (meaning this test can be used) for the duration of the COVID-19 declaration under Section 564(b)(1) of the Act, 21 U.S.C.section 360bbb-3(b)(1), unless the authorization is terminated  or revoked sooner.       Influenza A by PCR NEGATIVE NEGATIVE Final   Influenza B by PCR NEGATIVE NEGATIVE Final    Comment: (NOTE) The Xpert Xpress SARS-CoV-2/FLU/RSV plus assay is intended as an aid in the diagnosis of influenza from Nasopharyngeal swab specimens and should not be used as a sole basis for treatment. Nasal washings and aspirates are unacceptable for Xpert Xpress SARS-CoV-2/FLU/RSV testing.  Fact Sheet for  Patients: EntrepreneurPulse.com.au  Fact Sheet for Healthcare Providers: IncredibleEmployment.be  This test is not yet approved or cleared by the [place] FDA and has been authorized for detection and/or diagnosis of SARS-CoV-2 by FDA under an Emergency Use Authorization (EUA). This EUA will remain in effect (meaning this test can be used) for the duration of the COVID-19 declaration under Section 564(b)(1) of the Act, 21 U.S.C. section 360bbb-3(b)(1), unless the authorization is  terminated or revoked.     Resp Syncytial Virus by PCR NEGATIVE NEGATIVE Final    Comment: (NOTE) Fact Sheet for Patients: EntrepreneurPulse.com.au  Fact Sheet for Healthcare Providers: IncredibleEmployment.be  This test is not yet approved or cleared by the [place] FDA and has been authorized for detection and/or diagnosis of SARS-CoV-2 by FDA under an Emergency Use Authorization (EUA). This EUA will remain in effect (meaning this test can be used) for the duration of the COVID-19 declaration under Section 564(b)(1) of the Act, 21 U.S.C. section 360bbb-3(b)(1), unless the authorization is terminated or revoked.  Performed at [facility], [place] [address], [place], [place] [address]       Radiology Studies: CT Angio Chest PE W and/or Wo Contrast  Addendum Date: 04/04/2022   ADDENDUM REPORT: 04/04/2022 22:44 ADDENDUM: Critical Value/emergent results were called by telephone at the time of interpretation on 04/04/2022 at 10:44 pm to provider DAVID YAO , who verbally acknowledged these results. Electronically Signed   By: Brett Fairy M.D.   On: 04/04/2022 22:44   Result Date: 04/04/2022 CLINICAL DATA:  Pulmonary embolism suspected, high probability. Hypotension, stroke suspected. EXAM: CT ANGIOGRAPHY CHEST WITH CONTRAST TECHNIQUE: Multidetector CT imaging of the chest was performed using the standard protocol during  bolus administration of intravenous contrast. Multiplanar CT image reconstructions and MIPs were obtained to evaluate the vascular anatomy. RADIATION DOSE REDUCTION: This exam was performed according to the departmental dose-optimization program which includes automated exposure control, adjustment of the mA and/or kV according to patient size and/or use of iterative reconstruction technique. CONTRAST:  45m OMNIPAQUE IOHEXOL 350 MG/ML SOLN COMPARISON:  None Available. FINDINGS: Cardiovascular: The heart is mildly enlarged and there is no pericardial effusion. Multi-vessel coronary artery calcifications are seen. There is atherosclerotic calcification of the aorta without evidence of aneurysm. The pulmonary trunk is normal in caliber. Small lobar, segmental, and subsegmental pulmonary artery filling defects in the left lower lobe and segmental and subsegmental pulmonary emboli in the right lower lobe. No evidence of right heart strain. Mediastinum/Nodes: No mediastinal, hilar, or axillary lymphadenopathy. The thyroid gland, trachea, and esophagus are within normal limits. Lungs/Pleura: There is a trace left pleural effusion. Bronchial wall thickening is noted bilaterally with patchy atelectasis or infiltrate at the lung bases. No pneumothorax Upper Abdomen: Stones are present within the gallbladder. There is a cyst in the right kidney. No acute abnormality. Musculoskeletal: There are compression deformities at T6, T8, T11, and T12, indeterminate in age. Degenerative changes are present in the thoracic spine. Review of the MIP images confirms the above findings. IMPRESSION: 1. Bilateral lower lobe pulmonary emboli. No evidence of right heart strain. 2. Patchy atelectasis or infiltrate at the lung bases. 3. Trace left pleural effusion. 4. Cholelithiasis. 5. Multilevel compression deformities in the thoracic spine, indeterminate in age. 6. Coronary artery calcifications. 7. Aortic atherosclerosis. Electronically  Signed: By: LBrett FairyM.D. On: 04/04/2022 22:41   CT HEAD WO CONTRAST (5MM)  Result Date: 04/04/2022 CLINICAL DATA:  Acute neurologic deficit EXAM: CT HEAD WITHOUT CONTRAST TECHNIQUE: Contiguous axial images were obtained from the base of the skull through the vertex without intravenous contrast. RADIATION DOSE REDUCTION: This exam was performed according to the departmental dose-optimization program which includes automated exposure control, adjustment of the mA and/or kV according to patient size and/or use of iterative reconstruction technique. COMPARISON:  None Available. FINDINGS: Brain: There is no mass, hemorrhage or extra-axial collection. There is generalized atrophy without lobar predilection. Hypodensity of the white  matter is most commonly associated with chronic microvascular disease. Vascular: Atherosclerotic calcification of the vertebral and internal carotid arteries at the skull base. No abnormal hyperdensity of the major intracranial arteries or dural venous sinuses. Skull: The visualized skull base, calvarium and extracranial soft tissues are normal. Sinuses/Orbits: No fluid levels or advanced mucosal thickening of the visualized paranasal sinuses. No mastoid or middle ear effusion. The orbits are normal. IMPRESSION: 1. No acute intracranial abnormality. 2. Generalized atrophy and findings of chronic microvascular disease. Electronically Signed   By: Ulyses Jarred M.D.   On: 04/04/2022 22:36   DG Chest Port 1 View  Result Date: 04/04/2022 CLINICAL DATA:  Possible sepsis.  Weakness for 2 weeks. EXAM: PORTABLE CHEST 1 VIEW COMPARISON:  07/11/2016. FINDINGS: The heart size and mediastinal contours are stable. There is atherosclerotic calcification of the aorta. Lung volumes are low. There is patchy airspace disease at the left lung base. No effusion or pneumothorax. A stable compression deformity is present in the mid thoracic spine. No acute osseous abnormality. IMPRESSION: Patchy  atelectasis or infiltrate at the left lung base. Electronically Signed   By: Brett Fairy M.D.   On: 04/04/2022 20:08       LOS: 1 day   [facility] Pager on [email or web address]  04/05/2022, 8:49 AM

## 2022-04-05 NOTE — Progress Notes (Signed)
Modified Barium Swallow Progress Note  Patient Details  Name: Tom Ortiz MRN: [id number] Date of Birth: [birth year]  Today's Date: 04/05/2022  Modified Barium Swallow completed.  Full report located under Chart Review in the Imaging Section.  Brief recommendations include the following:  Clinical Impression  Pt presents with a mild-moderate oropharyngeal dysphagia c/b lingual discoordination, delayed swallow initiation, incomplete laryngeal closure, seemingely reduced UES opening, and diminished sensation. Pt also with very poor positioning which appears to impact swallow function.  SLP had assist pt to hold his head up for PO trials during MBS. These deficits resulted in silent aspiration of thin liquid before the swallow.  Cup presentation was not reliably beneficial to prevent aspiration.  Pt was unable to clear aspiration and did not follow instructions for cued cough.  With nectar thick liquid there was premature spillage to the pyriform sinuses but no penetration or aspiration with both cup and straw presentation.  Pt was unable to siphon honey thick liquid by straw.  There was no penetration of hone thick liquid by cup.  There was prolonged oral phase with reduced bolus cohesion.  Pt did not initiate swallow until bolus had fully transited from oral cavity with stasis of portions of bolus in pharyngeal stasis rather than use of piecemeal degultition.  With puree and regular solid texture there was no penetration or aspiration.  With solid texture in particular there was increased retention of contrast at UES, but no backflow to pharyx.  There was diffuse pharyngeal residue with all consistencies.  Pt is not a good candidate for swallowing therapy 2/2 inability to follow directions consistently.  Recommend mehcanical soft diet with nectar thick liquids.   Swallow Evaluation Recommendations       SLP Diet Recommendations: Dysphagia 3 (Mech soft) solids;Nectar thick liquid   Liquid  Administration via: Cup;Straw   Medication Administration: Crushed with puree (can attempt whole if pt is able to orally manipulate pills)   Supervision: Full assist for feeding   Compensations:  Slow rate; Small sips/bites; Position pt upright and assist to hold head upright and at midline as needed for pt to be able eat   Postural Changes: Seated upright at 90 degrees   Oral Care Recommendations: Oral care BID   Other Recommendations: Order thickener from [place], [place], [place] Office: [phone] 04/05/2022,4:04 PM

## 2022-04-05 NOTE — Progress Notes (Signed)
ANTICOAGULATION CONSULT NOTE - Follow-up Note  Pharmacy Consult for Heparin Indication: pulmonary embolus  Allergies  Allergen Reactions   Fosamax [Alendronate] Other (See Comments)    Unknown   Vilazodone Hcl Nausea Only   Quinolones Hives and Rash    89 yr old allergy to quinine    Patient Measurements: Height: '5\' 10"'$  (177.8 cm) Weight: 67.1 kg (147 lb 14.4 oz) IBW/kg (Calculated) : 73 Heparin Dosing Weight: 67.1 kg  Vital Signs: Temp: 98.2 F (36.8 C) (01/21 1541) Temp Source: Oral (01/21 1541) BP: 96/50 (01/21 1607) Pulse Rate: 91 (01/21 1541)  Labs: Recent Labs    04/04/22 1946 04/04/22 2315 04/05/22 0221 04/05/22 0755 04/05/22 1810  HGB 9.0*  --  8.4*  --   --   HCT 28.1*  --  25.6*  --   --   PLT 174  --  172  --   --   APTT 32  --   --   --   --   LABPROT 16.4*  --   --   --   --   INR 1.3*  --   --   --   --   HEPARINUNFRC  --   --   --  0.33 0.51  CREATININE 1.81*  --  1.68*  --   --   TROPONINIHS 215* 199*  --   --   --      Estimated Creatinine Clearance: 26.6 mL/min (A) (by C-G formula based on SCr of 1.68 mg/dL (H)).   Medical History: Past Medical History:  Diagnosis Date   Anxiety    Arthritis    Essential hypertension 04/16/2014   Hard of hearing    tinnitus both ears   Hypertension    Paroxysmal atrial fibrillation ([place]) 04/28/2014   Shoulder pain, left 07/14/2016   states left rotator pain preop    Medications:  Medications Prior to Admission  Medication Sig Dispense Refill Last Dose   acetaminophen (TYLENOL) 650 MG CR tablet Take 650 mg by mouth as needed for pain.   Past Week   ASPIRIN LOW DOSE 81 MG EC tablet TAKE 1 TABLET BY MOUTH EVERY DAY. SWALLOW WHOLE. (Patient taking differently: Take 81 mg by mouth daily.) 90 tablet 3 04/04/2022   atorvastatin (LIPITOR) 40 MG tablet TAKE 1 TABLET BY MOUTH EVERY DAY (Patient taking differently: Take 40 mg by mouth daily.) 90 tablet 3 9/38/1829   bismuth subsalicylate (PEPTO BISMOL) 262  MG/15ML suspension Take 30 mLs by mouth as needed for indigestion or diarrhea or loose stools.   Past Month   feeding supplement (ENSURE ENLIVE / ENSURE PLUS) LIQD Take 237 mLs by mouth daily as needed (weight gain). (Patient taking differently: Take 237 mLs by mouth in the morning, at noon, and at bedtime.)   unk   isosorbide mononitrate (IMDUR) 60 MG 24 hr tablet TAKE 1 TABLET BY MOUTH EVERY DAY (Patient taking differently: Take 60 mg by mouth daily.) 90 tablet 3 04/04/2022   lisinopril (ZESTRIL) 5 MG tablet TAKE 1 TABLET BY MOUTH EVERY DAY (Patient taking differently: Take 5 mg by mouth daily.) 90 tablet 3 04/04/2022   metoprolol succinate (TOPROL-XL) 50 MG 24 hr tablet TAKE 1 TABLET BY MOUTH EVERY DAY. TAKE WITH OR IMMEDIATELY FOLLOWING A MEAL (Patient taking differently: Take 50 mg by mouth daily.) 90 tablet 3 04/04/2022 at 1400   nitroGLYCERIN (NITROSTAT) 0.4 MG SL tablet Place 1 tablet (0.4 mg total) under the tongue every 5 (five) minutes x 3  doses as needed for chest pain. 25 tablet 1 never   docusate sodium (COLACE) 100 MG capsule Take 1 capsule (100 mg total) by mouth daily as needed for mild constipation. (Patient not taking: Reported on 04/05/2022)   Not Taking    Scheduled:   aspirin EC  81 mg Oral Daily   atorvastatin  20 mg Oral Daily   [START ON 04/06/2022] vitamin B-12  500 mcg Oral Daily   levETIRAcetam  250 mg Oral BID   memantine  5 mg Oral BID   tamsulosin  0.4 mg Oral Daily   Infusions:   sodium chloride 75 mL/hr at 04/05/22 1127   heparin 1,150 Units/hr (04/05/22 1119)   PRN: acetaminophen **OR** acetaminophen, magic mouthwash, ondansetron **OR** ondansetron (ZOFRAN) IV, senna-docusate  Assessment: 89 yom with a history of HTN and AF. Patient is presenting with weakness. Heparin per pharmacy consult placed for pulmonary embolus.  Patient was not on anticoagulation prior to arrival. Started on 1100 units/hr IV heparin following 3000 unit IV heparin bolus. Resulting heparin  level is 0.33 which is borderline therapeutic.  No issues with infusion or bleeding per RN.  Hgb 9>8.4; plt 174>172  Heparin level is therapeutic x2. Cont current rate.   Goal of Therapy:  Heparin level 0.3-0.7 units/ml Monitor platelets by anticoagulation protocol: Yes   Plan:  Cont heparin infusion 1150 units/hr Check anti-Xa level daily while on heparin Continue to monitor H&H and platelets  Onnie Boer, PharmD, BCIDP, AAHIVP, CPP Infectious Disease Pharmacist 04/05/2022 6:53 PM

## 2022-04-05 NOTE — Progress Notes (Signed)
ANTICOAGULATION CONSULT NOTE - Follow-up Note  Pharmacy Consult for Heparin Indication: pulmonary embolus  Allergies  Allergen Reactions   Fosamax [Alendronate] Other (See Comments)    Unknown   Vilazodone Hcl Nausea Only   Quinolones Hives and Rash    89 yr old allergy to quinine    Patient Measurements: Height: '5\' 10"'$  (177.8 cm) Weight: 67.1 kg (147 lb 14.4 oz) IBW/kg (Calculated) : 73 Heparin Dosing Weight: 67.1 kg  Vital Signs: Temp: 97.8 F (36.6 C) (01/21 0800) Temp Source: Axillary (01/21 0800) BP: 94/60 (01/21 0800) Pulse Rate: 72 (01/21 0800)  Labs: Recent Labs    04/04/22 1946 04/04/22 2315 04/05/22 0221 04/05/22 0755  HGB 9.0*  --  8.4*  --   HCT 28.1*  --  25.6*  --   PLT 174  --  172  --   APTT 32  --   --   --   LABPROT 16.4*  --   --   --   INR 1.3*  --   --   --   HEPARINUNFRC  --   --   --  0.33  CREATININE 1.81*  --  1.68*  --   TROPONINIHS 215* 199*  --   --     Estimated Creatinine Clearance: 26.6 mL/min (A) (by C-G formula based on SCr of 1.68 mg/dL (H)).   Medical History: Past Medical History:  Diagnosis Date   Anxiety    Arthritis    Essential hypertension 04/16/2014   Hard of hearing    tinnitus both ears   Hypertension    Paroxysmal atrial fibrillation ([place]) 04/28/2014   Shoulder pain, left 07/14/2016   states left rotator pain preop    Medications:  (Not in a hospital admission)  Scheduled:   aspirin EC  81 mg Oral Daily   atorvastatin  20 mg Oral Daily   [START ON 04/06/2022] vitamin B-12  500 mcg Oral Daily   isosorbide mononitrate  30 mg Oral Daily   levETIRAcetam  250 mg Oral BID   memantine  5 mg Oral BID   tamsulosin  0.4 mg Oral Daily   Infusions:   sodium chloride     heparin 1,100 Units/hr (04/05/22 0837)   PRN: acetaminophen **OR** acetaminophen, ondansetron **OR** ondansetron (ZOFRAN) IV, senna-docusate  Assessment: 38 yom with a history of HTN and AF. Patient is presenting with weakness. Heparin per  pharmacy consult placed for pulmonary embolus.  Patient was not on anticoagulation prior to arrival. Started on 1100 units/hr IV heparin following 3000 unit IV heparin bolus. Resulting heparin level is 0.33 which is borderline therapeutic.  No issues with infusion or bleeding per RN.  Hgb 9>8.4; plt 174>172  Goal of Therapy:  Heparin level 0.3-0.7 units/ml Monitor platelets by anticoagulation protocol: Yes   Plan:  Slightly increase heparin infusion to 1150 units/hr Check anti-Xa level at 1800 and daily while on heparin Continue to monitor H&H and platelets  Lorelei Pont, PharmD, BCPS 04/05/2022 9:55 AM ED Clinical Pharmacist -  [phone]

## 2022-04-05 NOTE — Progress Notes (Signed)
Lower extremity venous has been completed.   Preliminary results in CV Proc.   Jinny Blossom Deah Ottaway 04/05/2022 10:43 AM

## 2022-04-05 NOTE — Progress Notes (Signed)
Pt arrived from ..ED..., A/ox .4..pt denies any pain, MD aware,CCMD called. CHG bath given,no further needs at this time   

## 2022-04-05 NOTE — Consult Note (Signed)
Cardiology Admission History and Physical   Patient ID: Tom Ortiz MRN: [id number]; DOB: 02-Aug-1927   Admission date: 04/04/2022  PCP:  Merryl Hacker No   [place] [facility] Cardiologist:  Shelva Majestic, MD        Chief Complaint:  AF with RVR   History of Present Illness:   Tom Ortiz is a 89 y.o. male with dementia, paroxysmal AF, HFrEF, and HTN who presented to the ED with fatigue and shortness of breath. He was found to have a PE on imaging. Cardiology consulted for assistance with management of AF with RVR.   History limited as patient does not recall why he was brought to the ED. From ED and Hospitalist notes, the patient was brought in for hypotension and altered mental status after his son noticed that he was becoming more confused over the past week. They also noticed that he was experiencing some shortness of breath.   He was brought to the ED and was found to be tachycardic to the 130-140s in atrial fibrillation. EKG did not show any dynamic changes. Labs were notable for an AKI with sCr , lactate of 2.2, elevated troponin 215 -> 199. CT-PE showed a bilateral lower lobe PE without right heart strain. He was given IV fluids and started on a heparin drip.   Past Medical History:  Diagnosis Date   Anxiety    Arthritis    Essential hypertension 04/16/2014   Hard of hearing    tinnitus both ears   Hypertension    Paroxysmal atrial fibrillation ([place]) 04/28/2014   Shoulder pain, left 07/14/2016   states left rotator pain preop    Past Surgical History:  Procedure Laterality Date   APPENDECTOMY  yrs ago   [place]  yrs ago   HIP CLOSED REDUCTION  07/01/2011, left   Procedure: [place];  Surgeon: Magnus Sinning, MD;  [facility];  Service: Orthopedics;  Laterality: Left;   HIP CLOSED REDUCTION  06/11/2011   Procedure: CLOSED MANIPULATION HIP;  Surgeon: Magnus Sinning, MD;  [facility];  Service: Orthopedics;  Laterality:  Left;   HIP CLOSED REDUCTION  07/01/2011   Procedure: CLOSED MANIPULATION HIP;  Surgeon: Magnus Sinning, MD;  [facility];  Service: Orthopedics;  Laterality: Left;   HIP CLOSED REDUCTION  12/08/2011   Procedure: CLOSED MANIPULATION HIP;  Surgeon: Sydnee Cabal, MD;  [facility];  Service: Orthopedics;  Laterality: Left;   JOINT REPLACEMENT  2007 right, left 17 yrs ago   Bilateral hip   NM MYOVIEW LTD  2012   ORIF PERIPROSTHETIC FRACTURE Left 07/14/2016   Procedure: OPEN REDUCTION INTERNAL FIXATION (ORIF) PERIPROSTHETIC FRACTURE LEFT HIP;  Surgeon: Paralee Cancel, MD;  [facility];  Service: Orthopedics;  Laterality: Left;   TONSILLECTOMY  as child   TOTAL HIP REVISION  10/02/2011   Procedure: TOTAL HIP REVISION;  Surgeon: Gearlean Alf, MD;  [facility];  Service: Orthopedics;  Laterality: Right;   TOTAL HIP REVISION  01/27/2012   Procedure: TOTAL HIP REVISION;  Surgeon: Gearlean Alf, MD;  [facility];  Service: Orthopedics;  Laterality: Left;     Medications Prior to Admission: Prior to Admission medications   Medication Sig Start Date End Date Taking? Authorizing Provider  ASPIRIN LOW DOSE 81 MG EC tablet TAKE 1 TABLET BY MOUTH EVERY DAY. SWALLOW WHOLE. 07/22/21   Croitoru, Mihai, MD  atorvastatin (LIPITOR) 40 MG tablet TAKE 1 TABLET BY MOUTH  EVERY DAY 07/22/21   Croitoru, Dani Gobble, MD  bismuth subsalicylate (PEPTO BISMOL) 262 MG/15ML suspension Take 30 mLs by mouth every 6 (six) hours as needed for indigestion or diarrhea or loose stools.    [provider]  docusate sodium (COLACE) 100 MG capsule Take 1 capsule (100 mg total) by mouth daily as needed for mild constipation. 07/02/20   Troy Sine, MD  feeding supplement (ENSURE ENLIVE / ENSURE PLUS) LIQD Take 237 mLs by mouth daily as needed (weight gain). 07/02/20   Troy Sine, MD  HYDROcodone-acetaminophen (NORCO/VICODIN) 5-325 MG tablet Take 1-2 tablets by mouth every 6 (six) hours as needed for  moderate pain. Patient not taking: Reported on 12/03/2020 07/14/16   Danae Orleans, PA-C  isosorbide mononitrate (IMDUR) 60 MG 24 hr tablet TAKE 1 TABLET BY MOUTH EVERY DAY 07/22/21   Croitoru, Mihai, MD  lisinopril (ZESTRIL) 5 MG tablet TAKE 1 TABLET BY MOUTH EVERY DAY 07/22/21   Croitoru, Mihai, MD  LORazepam (ATIVAN) 0.5 MG tablet Take 0.5 mg by mouth at bedtime as needed for sleep.  Patient not taking: Reported on 12/03/2020 07/07/16   [provider]  MELATONIN PO Take 15-20 mg by mouth 2 (two) times daily as needed (sleep). Patient not taking: Reported on 12/03/2020    [provider]  metoprolol succinate (TOPROL-XL) 50 MG 24 hr tablet TAKE 1 TABLET BY MOUTH EVERY DAY. TAKE WITH OR IMMEDIATELY FOLLOWING A MEAL 07/22/21   Croitoru, Dani Gobble, MD  nitroGLYCERIN (NITROSTAT) 0.4 MG SL tablet Place 1 tablet (0.4 mg total) under the tongue every 5 (five) minutes x 3 doses as needed for chest pain. 07/02/20   Troy Sine, MD  sulfamethoxazole-trimethoprim (BACTRIM DS) 800-160 MG tablet Take 1 tablet by mouth 2 (two) times daily. For 14 days Patient not taking: Reported on 12/03/2020 08/22/20   Deberah Pelton, NP     Allergies:    Allergies  Allergen Reactions   Fosamax [Alendronate]     Other reaction(s): Unknown   Vilazodone Hcl     Other reaction(s): nausea   Quinolones Hives and Rash    89 yr old allergy to quinine    Social History:   Social History   Socioeconomic History   Marital status: Married    Spouse name: Not on file   Number of children: Not on file   Years of education: Not on file   Highest education level: Not on file  Occupational History   Not on file  Tobacco Use   Smoking status: Former    Packs/day: 0.50    Years: 20.00    Total pack years: 10.00    Types: Cigarettes    Start date: 07/12/1973    Quit date: 07/12/1993    Years since quitting: 28.7   Smokeless tobacco: Former    Quit date: 01/21/1962   Tobacco comments:    QUIT 21 YEARS AGP   Substance and Sexual Activity   Alcohol use: No    Alcohol/week: 0.0 standard drinks of alcohol   Drug use: No   Sexual activity: Never  Other Topics Concern   Not on file  Social History Narrative   Not on file   Social Determinants of Health   Financial Resource Strain: Not on file  Food Insecurity: Not on file  Transportation Needs: Not on file  Physical Activity: Not on file  Stress: Not on file  Social Connections: Not on file  Intimate Partner Violence: Not on file  Family History:   The patient's family history is not on file.    ROS:  Please see the history of present illness.  All other ROS reviewed and negative.     Physical Exam/Data:   Vitals:   04/05/22 0200 04/05/22 0215 04/05/22 0256 04/05/22 0300  BP: 93/67 (!) 91/57 (!) 136/115 116/65  Pulse: 61 73 94 87  Resp: 16 (!) 27  (!) 21  Temp:      TempSrc:      SpO2: 94% 100%  99%  Weight:      Height:        Intake/Output Summary (Last 24 hours) at 04/05/2022 0308 Last data filed at 04/05/2022 0207 Gross per 24 hour  Intake 977.5 ml  Output 100 ml  Net 877.5 ml      04/04/2022    9:42 PM 12/03/2020   11:55 AM 07/24/2020   11:19 AM  Last 3 Weights  Weight (lbs) 147 lb 14.4 oz 143 lb 155 lb 9.6 oz  Weight (kg) 67.087 kg 64.864 kg 70.58 kg     Body mass index is 21.22 kg/m.  General:  Well nourished, elderly, chronically ill appearing, in no acute distress HEENT: normal Neck: no JVD Cardiac:  normal S1, S2; not tachy, irregularly irregular; no m/r/g appreciated  Lungs:  clear to auscultation bilaterally, no wheezing, rhonchi or rales  Abd: soft, nontender, no hepatomegaly  Ext: trace lower extremity edema Skin: warm and dry  Neuro:  no focal abnormalities noted    Relevant CV Studies: 06/30/20 - TTE  IMPRESSIONS    1. No LV thrombus seen on contrasted images. Left ventricular ejection fraction, by estimation, is 30 to 35%. The left ventricle has moderately decreased function. The  left ventricle demonstrates regional wall motion abnormalities: global hypokinesis with inferior and inferoseptal akinesis. Left ventricular diastolic parameters are indeterminate.  2. Left atrial size was mildly dilated.  3. The mitral valve is normal in structure. Trivial mitral valve regurgitation.  4. The aortic valve is calcified. Aortic valve regurgitation is mild. No aortic stenosis is present.  5. There is borderline dilatation of the aortic root, measuring 38 mm. There is borderline dilatation of the ascending aorta, measuring 37 mm.  6. Right ventricular systolic function is normal. The right ventricular size is normal.    Laboratory Data:  High Sensitivity Troponin:   Recent Labs  Lab 04/04/22 1946 04/04/22 2315  TROPONINIHS 215* 199*      Chemistry Recent Labs  Lab 04/04/22 1946 04/05/22 0221  NA 138 137  K 4.5 4.3  CL 107 105  CO2 25 23  GLUCOSE 113* 112*  BUN 60* 58*  CREATININE 1.81* 1.68*  CALCIUM 7.7* 7.4*  GFRNONAA 35* 38*  ANIONGAP 6 9    Recent Labs  Lab 04/04/22 1946  PROT 5.2*  ALBUMIN 1.7*  AST 38  ALT 19  ALKPHOS 96  BILITOT 1.0   Lipids No results for input(s): "CHOL", "TRIG", "HDL", "LABVLDL", "LDLCALC", "CHOLHDL" in the last 168 hours. Hematology Recent Labs  Lab 04/04/22 1946 04/05/22 0221  WBC 8.0 8.5  RBC 2.44* 2.27*  HGB 9.0* 8.4*  HCT 28.1* 25.6*  MCV 115.2* 112.8*  MCH 36.9* 37.0*  MCHC 32.0 32.8  RDW 15.4 15.3  PLT 174 172   Thyroid No results for input(s): "TSH", "FREET4" in the last 168 hours. BNPNo results for input(s): "BNP", "PROBNP" in the last 168 hours.  DDimer No results for input(s): "DDIMER" in the last 168 hours.  Radiology/Studies:  CT Angio Chest PE W and/or Wo Contrast  Addendum Date: 04/04/2022   ADDENDUM REPORT: 04/04/2022 22:44 ADDENDUM: Critical Value/emergent results were called by telephone at the time of interpretation on 04/04/2022 at 10:44 pm to provider DAVID YAO , who verbally  acknowledged these results. Electronically Signed   By: Brett Fairy M.D.   On: 04/04/2022 22:44   Result Date: 04/04/2022 CLINICAL DATA:  Pulmonary embolism suspected, high probability. Hypotension, stroke suspected. EXAM: CT ANGIOGRAPHY CHEST WITH CONTRAST TECHNIQUE: Multidetector CT imaging of the chest was performed using the standard protocol during bolus administration of intravenous contrast. Multiplanar CT image reconstructions and MIPs were obtained to evaluate the vascular anatomy. RADIATION DOSE REDUCTION: This exam was performed according to the departmental dose-optimization program which includes automated exposure control, adjustment of the mA and/or kV according to patient size and/or use of iterative reconstruction technique. CONTRAST:  33m OMNIPAQUE IOHEXOL 350 MG/ML SOLN COMPARISON:  None Available. FINDINGS: Cardiovascular: The heart is mildly enlarged and there is no pericardial effusion. Multi-vessel coronary artery calcifications are seen. There is atherosclerotic calcification of the aorta without evidence of aneurysm. The pulmonary trunk is normal in caliber. Small lobar, segmental, and subsegmental pulmonary artery filling defects in the left lower lobe and segmental and subsegmental pulmonary emboli in the right lower lobe. No evidence of right heart strain. Mediastinum/Nodes: No mediastinal, hilar, or axillary lymphadenopathy. The thyroid gland, trachea, and esophagus are within normal limits. Lungs/Pleura: There is a trace left pleural effusion. Bronchial wall thickening is noted bilaterally with patchy atelectasis or infiltrate at the lung bases. No pneumothorax Upper Abdomen: Stones are present within the gallbladder. There is a cyst in the right kidney. No acute abnormality. Musculoskeletal: There are compression deformities at T6, T8, T11, and T12, indeterminate in age. Degenerative changes are present in the thoracic spine. Review of the MIP images confirms the above findings.  IMPRESSION: 1. Bilateral lower lobe pulmonary emboli. No evidence of right heart strain. 2. Patchy atelectasis or infiltrate at the lung bases. 3. Trace left pleural effusion. 4. Cholelithiasis. 5. Multilevel compression deformities in the thoracic spine, indeterminate in age. 6. Coronary artery calcifications. 7. Aortic atherosclerosis. Electronically Signed: By: LBrett FairyM.D. On: 04/04/2022 22:41   CT HEAD WO CONTRAST (5MM)  Result Date: 04/04/2022 CLINICAL DATA:  Acute neurologic deficit EXAM: CT HEAD WITHOUT CONTRAST TECHNIQUE: Contiguous axial images were obtained from the base of the skull through the vertex without intravenous contrast. RADIATION DOSE REDUCTION: This exam was performed according to the departmental dose-optimization program which includes automated exposure control, adjustment of the mA and/or kV according to patient size and/or use of iterative reconstruction technique. COMPARISON:  None Available. FINDINGS: Brain: There is no mass, hemorrhage or extra-axial collection. There is generalized atrophy without lobar predilection. Hypodensity of the white matter is most commonly associated with chronic microvascular disease. Vascular: Atherosclerotic calcification of the vertebral and internal carotid arteries at the skull base. No abnormal hyperdensity of the major intracranial arteries or dural venous sinuses. Skull: The visualized skull base, calvarium and extracranial soft tissues are normal. Sinuses/Orbits: No fluid levels or advanced mucosal thickening of the visualized paranasal sinuses. No mastoid or middle ear effusion. The orbits are normal. IMPRESSION: 1. No acute intracranial abnormality. 2. Generalized atrophy and findings of chronic microvascular disease. Electronically Signed   By: KUlyses JarredM.D.   On: 04/04/2022 22:36   DG Chest Port 1 View  Result Date: 04/04/2022 CLINICAL DATA:  Possible sepsis.  Weakness for 2 weeks.  EXAM: PORTABLE CHEST 1 VIEW COMPARISON:   07/11/2016. FINDINGS: The heart size and mediastinal contours are stable. There is atherosclerotic calcification of the aorta. Lung volumes are low. There is patchy airspace disease at the left lung base. No effusion or pneumothorax. A stable compression deformity is present in the mid thoracic spine. No acute osseous abnormality. IMPRESSION: Patchy atelectasis or infiltrate at the left lung base. Electronically Signed   By: Brett Fairy M.D.   On: 04/04/2022 20:08     Assessment and Plan:    Tom Ortiz is a 89 y.o. male with dementia, paroxysmal AF, HFrEF, and HTN who presented to the ED with fatigue and shortness of breath. He was found to have a PE on imaging. Cardiology consulted for assistance with management of AF with RVR.  # Acute PE # Elevated troponin :: Bilateral lower lobe PE without evidence of heart strain. Intermediate risk given elevated biomarkers and mildly elevated lactate.. Troponin likely elevated in the setting of demand ischemia from both AF with RVR and PE. Patient denies any chest pain and EKG without evidence of ACS.  - Heparin gtt - Continue to monitor on telemetry - TTE to eval function and presence of potential wall motion abnormalities  # AF RVR - Appears better rate controlled now - Would hold off on metoprolol in the acute setting given prior ED report of systolic BP in the 37S and mildly elevated lactate.  - Can introduce fractionated beta blocker if his rates become less well controlled - Continue to monitor on telemetry - Heparin gtt   # HFrEF - Hold GDMT in the acute setting - Daily weights with diuresis as needed - Repeat TTE as above      Signed, Clois Dupes, MD  04/05/2022 3:08 AM

## 2022-04-05 NOTE — Evaluation (Signed)
Clinical/Bedside Swallow Evaluation Patient Details  Name: NEVADA MULLETT MRN: [id number] Date of Birth: 07/31/1927  Today's Date: 04/05/2022 Time: SLP Start Time (ACUTE ONLY): 4098 SLP Stop Time (ACUTE ONLY): 1100 SLP Time Calculation (min) (ACUTE ONLY): 7 min  Past Medical History:  Past Medical History:  Diagnosis Date   Anxiety    Arthritis    Essential hypertension 04/16/2014   Hard of hearing    tinnitus both ears   Hypertension    Paroxysmal atrial fibrillation ([place]) 04/28/2014   Shoulder pain, left 07/14/2016   states left rotator pain preop   Past Surgical History:  Past Surgical History:  Procedure Laterality Date   APPENDECTOMY  yrs ago   [place]  yrs ago   HIP CLOSED REDUCTION  07/01/2011, left   Procedure: [place];  Surgeon: Magnus Sinning, MD;  [facility];  Service: Orthopedics;  Laterality: Left;   HIP CLOSED REDUCTION  06/11/2011   Procedure: CLOSED MANIPULATION HIP;  Surgeon: Magnus Sinning, MD;  [facility];  Service: Orthopedics;  Laterality: Left;   HIP CLOSED REDUCTION  07/01/2011   Procedure: CLOSED MANIPULATION HIP;  Surgeon: Magnus Sinning, MD;  [facility];  Service: Orthopedics;  Laterality: Left;   HIP CLOSED REDUCTION  12/08/2011   Procedure: CLOSED MANIPULATION HIP;  Surgeon: Sydnee Cabal, MD;  [facility];  Service: Orthopedics;  Laterality: Left;   JOINT REPLACEMENT  2007 right, left 17 yrs ago   Bilateral hip   NM MYOVIEW LTD  2012   ORIF PERIPROSTHETIC FRACTURE Left 07/14/2016   Procedure: OPEN REDUCTION INTERNAL FIXATION (ORIF) PERIPROSTHETIC FRACTURE LEFT HIP;  Surgeon: Paralee Cancel, MD;  [facility];  Service: Orthopedics;  Laterality: Left;   TONSILLECTOMY  as child   TOTAL HIP REVISION  10/02/2011   Procedure: TOTAL HIP REVISION;  Surgeon: Gearlean Alf, MD;  [facility];  Service: Orthopedics;  Laterality: Right;   TOTAL HIP REVISION  01/27/2012   Procedure: TOTAL HIP REVISION;   Surgeon: Gearlean Alf, MD;  [facility];  Service: Orthopedics;  Laterality: Left;   HPI:  Mr. Marasigan is a 89 year old Caucasian male who was brought into the emergency department after EMS was called due to history of weakness ongoing for about 2 weeks.  Found to be in rapid a-fib and to have acute pulmonary embolism. CXR and Chest CT 1/20 with "Patchy atelectasis or infiltrate at the left lung base." Head CT 1/20 with no acute findings. Pt with past medical history of dementia, hearing impairment, paroxysmal atrial fibrillation not on anticoagulation.    Assessment / Plan / Recommendation  Clinical Impression  Pt presents with clinical indicators of pharyngeal dysphagia.  Pt consistently exhibited multiple swallows (4-5) with thin liquids which was not observed with solid textures.  There was intermittent throat clearing with thin liquids.  Occasional beching noted with throat clear afterwards, concerning for esophageal dysphagia/dysmotility.  Thin liquid given after regular solid trial resulted in stronger cough.  Pt exhibited adequate oral clearance of solids and puree.  There were no clinical s/s of aspiration with puree.  Pt's chest imaging concernting for possible LLL infiltrate.  Recommend instrumental swallow assessment prior to initiation of PO diet.  Will plan for MBS this afternoon.    Recommend pt remain NPO pending results of MBSS.  Priority meds can be given with puree, if needed.  SLP Visit Diagnosis: Dysphagia, unspecified (R13.10)    Aspiration Risk  Mild aspiration risk  Diet Recommendation NPO   Medication Administration: Whole meds with puree (priority oral meds)    Other  Recommendations Oral Care Recommendations: Oral care QID    Recommendations for follow up therapy are one component of a multi-disciplinary discharge planning process, led by the attending physician.  Recommendations may be updated based on patient status, additional functional criteria and  insurance authorization.  Follow up Recommendations  (TBD)      Assistance Recommended at Discharge    Functional Status Assessment  (TBD)  Frequency and Duration  (TBD)          Prognosis Prognosis for Safe Diet Advancement:  (TBD)      Swallow Study   General Date of Onset: 04/04/22 HPI: Mr. Wanzer is a 89 year old Caucasian male who was brought into the emergency department after EMS was called due to history of weakness ongoing for about 2 weeks.  Found to be in rapid a-fib and to have acute pulmonary embolism. CXR and Chest CT 1/20 with "Patchy atelectasis or infiltrate at the left lung base." Head CT 1/20 with no acute findings. Pt with past medical history of dementia, hearing impairment, paroxysmal atrial fibrillation not on anticoagulation. Type of Study: Bedside Swallow Evaluation Previous Swallow Assessment: None Diet Prior to this Study: NPO Temperature Spikes Noted: No Respiratory Status: Nasal cannula History of Recent Intubation: No Behavior/Cognition: Alert;Confused;Cooperative Oral Cavity Assessment: Within Functional Limits Oral Care Completed by SLP: No Oral Cavity - Dentition: Missing dentition;Adequate natural dentition Self-Feeding Abilities: Needs assist Patient Positioning: Upright in bed Baseline Vocal Quality: Normal Volitional Cough:  (Fair) Volitional Swallow: Able to elicit    Oral/Motor/Sensory Function Overall Oral Motor/Sensory Function: Within functional limits Facial ROM: Within Functional Limits Facial Symmetry: Within Functional Limits Lingual ROM: Within Functional Limits Lingual Symmetry: Within Functional Limits Lingual Strength: Within Functional Limits Velum: Within Functional Limits Mandible: Within Functional Limits   Ice Chips Ice chips: Not tested   Thin Liquid Thin Liquid: Impaired Presentation: Straw Pharyngeal  Phase Impairments: Multiple swallows;Cough - Delayed;Cough - Immediate;Throat Clearing - Immediate    Nectar  Thick Nectar Thick Liquid: Not tested   Honey Thick Honey Thick Liquid: Not tested   Puree Puree: Within functional limits Presentation: Spoon   Solid     Solid: Within functional limits Presentation:  (SLP fed)      Celedonio Savage, [place], [place] Office: [phone] 04/05/2022,11:19 AM

## 2022-04-05 NOTE — ED Notes (Signed)
ED TO INPATIENT HANDOFF REPORT  ED Nurse Name and Phone #: Massie Maroon RN [phone]  S Name/Age/Gender Tom Ortiz 89 y.o. male Room/Bed: 038C/038C  Code Status   Code Status: DNR  Home/SNF/Other Nursing Home Patient oriented to: self Is this baseline? Yes   Triage Complete: Triage complete  Chief Complaint Acute pulmonary embolism [facility]) [I26.99]  Triage Note Patient arrives via ems from home secondary to feeling weak x 2 weeks.per ems initial bp was 70/40 with hr 155-140. Administered 100 nacl and 1 l LR, bp rebounded to 104/73 . Bgl 177   Allergies Allergies  Allergen Reactions   Fosamax [Alendronate] Other (See Comments)    Unknown   Vilazodone Hcl Nausea Only   Quinolones Hives and Rash    89 yr old allergy to quinine    Level of Care/Admitting Diagnosis ED Disposition     ED Disposition  Admit   Condition  --   [place]: [place] [[id number]]  Level of Care: Telemetry Cardiac [[id number]]  May admit patient to [facility] or [facility] if equivalent level of care is available:: Yes  Covid Evaluation: Confirmed COVID Negative  Diagnosis: Acute pulmonary embolism [facility]) [[id number]]  Admitting Physician: [place], [place]  Attending Physician: Quintella Baton [[id number]]  Certification:: I certify this patient will need inpatient services for at least 2 midnights  Estimated Length of Stay: 2          B Medical/Surgery History Past Medical History:  Diagnosis Date   Anxiety    Arthritis    Essential hypertension 04/16/2014   Hard of hearing    tinnitus both ears   Hypertension    Paroxysmal atrial fibrillation ([place]) 04/28/2014   Shoulder pain, left 07/14/2016   states left rotator pain preop   Past Surgical History:  Procedure Laterality Date   APPENDECTOMY  yrs ago   [place]  yrs ago   HIP CLOSED REDUCTION  07/01/2011, left   Procedure: Elsa;  Surgeon: Magnus Sinning, MD;  Location: [facility];   Service: Orthopedics;  Laterality: Left;   HIP CLOSED REDUCTION  06/11/2011   Procedure: CLOSED MANIPULATION HIP;  Surgeon: Magnus Sinning, MD;  Location: [facility];  Service: Orthopedics;  Laterality: Left;   HIP CLOSED REDUCTION  07/01/2011   Procedure: CLOSED MANIPULATION HIP;  Surgeon: Magnus Sinning, MD;  [facility]: [facility];  Service: Orthopedics;  Laterality: Left;   HIP CLOSED REDUCTION  12/08/2011   Procedure: CLOSED MANIPULATION HIP;  Surgeon: Sydnee Cabal, MD;  Location: [facility];  Service: Orthopedics;  Laterality: Left;   JOINT REPLACEMENT  2007 right, left 17 yrs ago   Bilateral hip   NM MYOVIEW LTD  2012   ORIF PERIPROSTHETIC FRACTURE Left 07/14/2016   Procedure: OPEN REDUCTION INTERNAL FIXATION (ORIF) PERIPROSTHETIC FRACTURE LEFT HIP;  Surgeon: Paralee Cancel, MD;  [facility];  Service: Orthopedics;  Laterality: Left;   TONSILLECTOMY  as child   TOTAL HIP REVISION  10/02/2011   Procedure: TOTAL HIP REVISION;  Surgeon: Gearlean Alf, MD;  [facility];  Service: Orthopedics;  Laterality: Right;   TOTAL HIP REVISION  01/27/2012   Procedure: TOTAL HIP REVISION;  Surgeon: Gearlean Alf, MD;  [facility];  Service: Orthopedics;  Laterality: Left;     A IV Location/Drains/Wounds Patient Lines/Drains/Airways Status     Active Line/Drains/Airways     Name Placement date Placement time Site Days   Peripheral IV 04/04/22 20  G Left Antecubital 04/04/22  2015  Antecubital  1   Peripheral IV 04/05/22 Anterior;Right;Upper Arm 04/05/22  0110  Arm  less than 1   Urethral Catheter NT Joly Straight-tip 16 Fr. 04/04/22  2044  Straight-tip  1   External Urinary Catheter 06/30/20  0100  --  644   Incision 03/25/11 Hip Left 03/25/11  2219  -- 4029   Incision 06/11/11 Leg Left 06/11/11  1934  -- 3951   Incision 07/01/11 Hip Left 07/01/11  1738  -- 3931   Incision 10/02/11 Hip Right 10/02/11  0849  -- 3838   Incision 12/08/11 Hip Left 12/08/11  2024  -- 3771   Incision 01/27/12  Hip Left 01/27/12  1540  -- 3721   Incision (Closed) 07/14/16 Hip Left 07/14/16  1459  -- 2091   Pressure Injury 06/30/20 Buttocks Stage 2 -  Partial thickness loss of dermis presenting as a shallow open injury with a red, pink wound bed without slough. looks like scratches that have opend up 06/30/20  2000  -- 644   Wound 03/26/11 Abrasion(s) Flank Left abrasion 03/26/11  0500  Flank  4028            Intake/Output Last 24 hours  Intake/Output Summary (Last 24 hours) at 04/05/2022 1253 Last data filed at 04/05/2022 0207 Gross per 24 hour  Intake 977.5 ml  Output 100 ml  Net 877.5 ml    Labs/Imaging Results for orders placed or performed during the hospital encounter of 04/04/22 (from the past 48 hour(s))  Lactic acid, plasma     Status: Abnormal   Collection Time: 04/04/22  7:46 PM  Result Value Ref Range   Lactic Acid, Venous 2.2 (HH) 0.5 - 1.9 mmol/L    Comment: CRITICAL RESULT CALLED TO, READ BACK BY AND VERIFIED WITH STEFANIA [facility] RN 04/04/22 2110 Wiliam Ke Performed at [facility], [place] [address] [place], [place] [address]   Comprehensive metabolic panel     Status: Abnormal   Collection Time: 04/04/22  7:46 PM  Result Value Ref Range   Sodium 138 135 - 145 mmol/L   Potassium 4.5 3.5 - 5.1 mmol/L   Chloride 107 98 - 111 mmol/L   CO2 25 22 - 32 mmol/L   Glucose, Bld 113 (H) 70 - 99 mg/dL    Comment: Glucose reference range applies only to samples taken after fasting for at least 8 hours.   BUN 60 (H) 8 - 23 mg/dL   Creatinine, Ser 1.81 (H) 0.61 - 1.24 mg/dL   Calcium 7.7 (L) 8.9 - 10.3 mg/dL   Total Protein 5.2 (L) 6.5 - 8.1 g/dL   Albumin 1.7 (L) 3.5 - 5.0 g/dL   AST 38 15 - 41 U/L   ALT 19 0 - 44 U/L   Alkaline Phosphatase 96 38 - 126 U/L   Total Bilirubin 1.0 0.3 - 1.2 mg/dL   GFR, Estimated 35 (L) >60 mL/min    Comment: (NOTE) Calculated using the CKD-EPI Creatinine Equation (2021)    Anion gap 6 5 - 15    Comment: Performed at [facility], [place] [address] [place], [place] [address]  CBC with Differential     Status: Abnormal   Collection Time: 04/04/22  7:46 PM  Result Value Ref Range   WBC 8.0 4.0 - 10.5 K/uL   RBC 2.44 (L) 4.22 - 5.81 MIL/uL   Hemoglobin 9.0 (L) 13.0 - 17.0 g/dL   HCT 28.1 (L) 39.0 -  52.0 %   MCV 115.2 (H) 80.0 - 100.0 fL   MCH 36.9 (H) 26.0 - 34.0 pg   MCHC 32.0 30.0 - 36.0 g/dL   RDW 15.4 11.5 - 15.5 %   Platelets 174 150 - 400 K/uL   nRBC 0.0 0.0 - 0.2 %   Neutrophils Relative % 61 %   Neutro Abs 5.0 1.7 - 7.7 K/uL   Lymphocytes Relative 24 %   Lymphs Abs 1.9 0.7 - 4.0 K/uL   Monocytes Relative 11 %   Monocytes Absolute 0.9 0.1 - 1.0 K/uL   Eosinophils Relative 2 %   Eosinophils Absolute 0.2 0.0 - 0.5 K/uL   Basophils Relative 1 %   Basophils Absolute 0.1 0.0 - 0.1 K/uL   Immature Granulocytes 1 %   Abs Immature Granulocytes 0.04 0.00 - 0.07 K/uL    Comment: Performed at [place] [address], [place], [place] [address]  Protime-INR     Status: Abnormal   Collection Time: 04/04/22  7:46 PM  Result Value Ref Range   Prothrombin Time 16.4 (H) 11.4 - 15.2 seconds   INR 1.3 (H) 0.8 - 1.2    Comment: (NOTE) INR goal varies based on device and disease states. Performed at [facility], [place] [address] [place], [place] [address]   APTT     Status: None   Collection Time: 04/04/22  7:46 PM  Result Value Ref Range   aPTT 32 24 - 36 seconds    Comment: Performed at [place] [address] [place], [place] [address]  Urinalysis, Routine w reflex microscopic Urine, Catheterized     Status: Abnormal   Collection Time: 04/04/22  7:46 PM  Result Value Ref Range   Color, Urine AMBER (A) YELLOW    Comment: BIOCHEMICALS MAY BE AFFECTED BY COLOR   APPearance CLOUDY (A) CLEAR   Specific Gravity, Urine 1.020 1.005 - 1.030   pH 5.0 5.0 - 8.0   Glucose, UA NEGATIVE NEGATIVE mg/dL   Hgb urine dipstick SMALL (A) NEGATIVE   Bilirubin Urine NEGATIVE NEGATIVE    Ketones, ur NEGATIVE NEGATIVE mg/dL   Protein, ur >=300 (A) NEGATIVE mg/dL   Nitrite NEGATIVE NEGATIVE   Leukocytes,Ua NEGATIVE NEGATIVE   RBC / HPF 0-5 0 - 5 RBC/hpf   WBC, UA 6-10 0 - 5 WBC/hpf   Bacteria, UA RARE (A) NONE SEEN   Squamous Epithelial / HPF 0-5 0 - 5 /HPF   Mucus PRESENT    Hyaline Casts, UA PRESENT    Granular Casts, UA PRESENT     Comment: Performed at [facility], [address] [place], [place] [address]  Troponin I (High Sensitivity)     Status: Abnormal   Collection Time: 04/04/22  7:46 PM  Result Value Ref Range   Troponin I (High Sensitivity) 215 (HH) <18 ng/L    Comment: ATTEMPTED CALL TO [phone] 04/04/22 2132 M KOROLESKI ATTEMPTED CALL TO [phone] 04/04/22 2143 M KOROLESKI CRITICAL RESULT CALLED TO, READ BACK BY AND VERIFIED WITH STEFANIA SERIANNI RN 04/04/22 2157 M KOROLESKI (NOTE) Elevated high sensitivity troponin I (hsTnI) values and significant  changes across serial measurements may suggest ACS but many other  chronic and acute conditions are known to elevate hsTnI results.  Refer to the "Links" section for chest pain algorithms and additional  guidance. Performed at [facility], [place] [address] [place], [place] [address]   Blood Culture (routine x 2)  Status: None (Preliminary result)   Collection Time: 04/04/22  8:00 PM   Specimen: BLOOD  Result Value Ref Range   Specimen Description BLOOD SITE NOT SPECIFIED    Special Requests      BOTTLES DRAWN AEROBIC AND ANAEROBIC Blood Culture adequate volume   Culture      NO GROWTH < 12 HOURS Performed at [facility], [address], [place], [place] [address]    Report Status PENDING   Resp panel by RT-PCR (RSV, Flu A&B, Covid) Anterior Nasal Swab     Status: None   Collection Time: 04/04/22  8:26 PM   Specimen: Anterior Nasal Swab  Result Value Ref Range   SARS Coronavirus 2 by RT PCR NEGATIVE NEGATIVE    Comment: (NOTE) SARS-CoV-2 target nucleic acids are NOT  DETECTED.  The SARS-CoV-2 RNA is generally detectable in upper respiratory specimens during the acute phase of infection. The lowest concentration of SARS-CoV-2 viral copies this assay can detect is 138 copies/mL. A negative result does not preclude SARS-Cov-2 infection and should not be used as the sole basis for treatment or other patient management decisions. A negative result may occur with  improper specimen collection/handling, submission of specimen other than nasopharyngeal swab, presence of viral mutation(s) within the areas targeted by this assay, and inadequate number of viral copies(<138 copies/mL). A negative result must be combined with clinical observations, patient history, and epidemiological information. The expected result is Negative.  Fact Sheet for Patients:  EntrepreneurPulse.com.au  Fact Sheet for Healthcare Providers:  IncredibleEmployment.be  This test is no t yet approved or cleared by the [place] FDA and  has been authorized for detection and/or diagnosis of SARS-CoV-2 by FDA under an Emergency Use Authorization (EUA). This EUA will remain  in effect (meaning this test can be used) for the duration of the COVID-19 declaration under Section 564(b)(1) of the Act, 21 U.S.C.section 360bbb-3(b)(1), unless the authorization is terminated  or revoked sooner.       Influenza A by PCR NEGATIVE NEGATIVE   Influenza B by PCR NEGATIVE NEGATIVE    Comment: (NOTE) The Xpert Xpress SARS-CoV-2/FLU/RSV plus assay is intended as an aid in the diagnosis of influenza from Nasopharyngeal swab specimens and should not be used as a sole basis for treatment. Nasal washings and aspirates are unacceptable for Xpert Xpress SARS-CoV-2/FLU/RSV testing.  Fact Sheet for Patients: EntrepreneurPulse.com.au  Fact Sheet for Healthcare Providers: IncredibleEmployment.be  This test is not yet approved or  cleared by the [place] FDA and has been authorized for detection and/or diagnosis of SARS-CoV-2 by FDA under an Emergency Use Authorization (EUA). This EUA will remain in effect (meaning this test can be used) for the duration of the COVID-19 declaration under Section 564(b)(1) of the Act, 21 U.S.C. section 360bbb-3(b)(1), unless the authorization is terminated or revoked.     Resp Syncytial Virus by PCR NEGATIVE NEGATIVE    Comment: (NOTE) Fact Sheet for Patients: EntrepreneurPulse.com.au  Fact Sheet for Healthcare Providers: IncredibleEmployment.be  This test is not yet approved or cleared by the [place] FDA and has been authorized for detection and/or diagnosis of SARS-CoV-2 by FDA under an Emergency Use Authorization (EUA). This EUA will remain in effect (meaning this test can be used) for the duration of the COVID-19 declaration under Section 564(b)(1) of the Act, 21 U.S.C. section 360bbb-3(b)(1), unless the authorization is terminated or revoked.  Performed at [facility], [place] [address], [place], [place] [address]   Blood Culture (  routine x 2)     Status: None (Preliminary result)   Collection Time: 04/04/22  8:26 PM   Specimen: BLOOD  Result Value Ref Range   Specimen Description BLOOD SITE NOT SPECIFIED    Special Requests      BOTTLES DRAWN AEROBIC AND ANAEROBIC Blood Culture adequate volume   Culture      NO GROWTH < 12 HOURS Performed at [facility], [place] [address], [place], [place] [address]    Report Status PENDING   Lactic acid, plasma     Status: None   Collection Time: 04/04/22 11:15 PM  Result Value Ref Range   Lactic Acid, Venous 1.9 0.5 - 1.9 mmol/L    Comment: Performed at [place] [address] [place], [place] [address]  Troponin I (High Sensitivity)     Status: Abnormal   Collection Time: 04/04/22 11:15 PM  Result Value Ref Range   Troponin I (High Sensitivity) 199 (HH)  <18 ng/L    Comment: CRITICAL VALUE NOTED. VALUE IS CONSISTENT WITH PREVIOUSLY REPORTED/CALLED VALUE (NOTE) Elevated high sensitivity troponin I (hsTnI) values and significant  changes across serial measurements may suggest ACS but many other  chronic and acute conditions are known to elevate hsTnI results.  Refer to the "Links" section for chest pain algorithms and additional  guidance. Performed at [facility], [place] [address] [place], [place] [address]   Vitamin B12     Status: None   Collection Time: 04/05/22  2:21 AM  Result Value Ref Range   Vitamin B-12 306 180 - 914 pg/mL    Comment: (NOTE) This assay is not validated for testing neonatal or myeloproliferative syndrome specimens for Vitamin B12 levels. Performed at [facility], [place] [address], [place], [place] [address]   Folate     Status: None   Collection Time: 04/05/22  2:21 AM  Result Value Ref Range   Folate 15.7 >5.9 ng/mL    Comment: Performed at [place] [address], [place], [place] [address]  TSH     Status: None   Collection Time: 04/05/22  2:21 AM  Result Value Ref Range   TSH 1.091 0.350 - 4.500 uIU/mL    Comment: Performed by a 3rd Generation assay with a functional sensitivity of <=0.01 uIU/mL. Performed at [facility], [place] [address] [place], [place] [address]   Basic metabolic panel     Status: Abnormal   Collection Time: 04/05/22  2:21 AM  Result Value Ref Range   Sodium 137 135 - 145 mmol/L   Potassium 4.3 3.5 - 5.1 mmol/L   Chloride 105 98 - 111 mmol/L   CO2 23 22 - 32 mmol/L   Glucose, Bld 112 (H) 70 - 99 mg/dL    Comment: Glucose reference range applies only to samples taken after fasting for at least 8 hours.   BUN 58 (H) 8 - 23 mg/dL   Creatinine, Ser 1.68 (H) 0.61 - 1.24 mg/dL   Calcium 7.4 (L) 8.9 - 10.3 mg/dL   GFR, Estimated 38 (L) >60 mL/min    Comment: (NOTE) Calculated using the CKD-EPI Creatinine Equation (2021)    Anion gap 9 5 - 15     Comment: Performed at [place] [address], [place], [place] [address]  CBC     Status: Abnormal   Collection Time: 04/05/22  2:21 AM  Result Value Ref Range   WBC 8.5 4.0 -  10.5 K/uL   RBC 2.27 (L) 4.22 - 5.81 MIL/uL   Hemoglobin 8.4 (L) 13.0 - 17.0 g/dL   HCT 25.6 (L) 39.0 - 52.0 %   MCV 112.8 (H) 80.0 - 100.0 fL   MCH 37.0 (H) 26.0 - 34.0 pg   MCHC 32.8 30.0 - 36.0 g/dL   RDW 15.3 11.5 - 15.5 %   Platelets 172 150 - 400 K/uL   nRBC 0.0 0.0 - 0.2 %    Comment: Performed at [place] [address], [place], [place] [address]  Valproic acid level     Status: Abnormal   Collection Time: 04/05/22  2:21 AM  Result Value Ref Range   Valproic Acid Lvl <10 (L) 50.0 - 100.0 ug/mL    Comment: RESULT CONFIRMED BY MANUAL DILUTION Performed at [place] [address], [place], [place] [address]   Heparin level (unfractionated)     Status: None   Collection Time: 04/05/22  7:55 AM  Result Value Ref Range   Heparin Unfractionated 0.33 0.30 - 0.70 IU/mL    Comment: (NOTE) The clinical reportable range upper limit is being lowered to >1.10 to align with the FDA approved guidance for the current laboratory assay.  If heparin results are below expected values, and patient dosage has  been confirmed, suggest follow up testing of antithrombin III levels. Performed at [facility], [place] [address], [place], [place] [address]    VAS [place] LOWER EXTREMITY VENOUS (DVT)  Result Date: 04/05/2022  Lower Venous DVT Study Patient Name:  Tom Ortiz [facility]  Date of Exam:   04/05/2022 Medical Rec #: [id number]         Accession #:    [id number] Date of Birth: [birth year]          Patient Gender: M Patient Age:   3 years Exam Location:  [facility] Procedure:      VAS [place] LOWER EXTREMITY VENOUS (DVT) Referring Phys: DEBBY CROSLEY --------------------------------------------------------------------------------  Indications: Pulmonary embolism.  Comparison Study: no prior  Performing Technologist: Archie Patten RVS  Examination Guidelines: A complete evaluation includes B-mode imaging, spectral Doppler, color Doppler, and power Doppler as needed of all accessible portions of each vessel. Bilateral testing is considered an integral part of a complete examination. Limited examinations for reoccurring indications may be performed as noted. The reflux portion of the exam is performed with the patient in reverse Trendelenburg.  +---------+---------------+---------+-----------+----------+--------------+ RIGHT    CompressibilityPhasicitySpontaneityPropertiesThrombus Aging +---------+---------------+---------+-----------+----------+--------------+ CFV      Full           Yes      Yes                                 +---------+---------------+---------+-----------+----------+--------------+ SFJ      Full                                                        +---------+---------------+---------+-----------+----------+--------------+ FV Prox  Full                                                        +---------+---------------+---------+-----------+----------+--------------+  FV Mid   Full                                                        +---------+---------------+---------+-----------+----------+--------------+ FV DistalFull           Yes      Yes                                 +---------+---------------+---------+-----------+----------+--------------+ PFV      Full                                                        +---------+---------------+---------+-----------+----------+--------------+ POP      Full           Yes      Yes                                 +---------+---------------+---------+-----------+----------+--------------+ PTV      Full           Yes      Yes                                 +---------+---------------+---------+-----------+----------+--------------+ PERO     Full           Yes      Yes                                  +---------+---------------+---------+-----------+----------+--------------+   +---------+---------------+---------+-----------+----------+--------------+ LEFT     CompressibilityPhasicitySpontaneityPropertiesThrombus Aging +---------+---------------+---------+-----------+----------+--------------+ CFV      Full           Yes      Yes                                 +---------+---------------+---------+-----------+----------+--------------+ SFJ      Full                                                        +---------+---------------+---------+-----------+----------+--------------+ FV Prox  Full                                                        +---------+---------------+---------+-----------+----------+--------------+ FV Mid   Full                                                        +---------+---------------+---------+-----------+----------+--------------+ FV  DistalFull                                                        +---------+---------------+---------+-----------+----------+--------------+ PFV      Full                                                        +---------+---------------+---------+-----------+----------+--------------+ POP      Full           Yes      Yes                                 +---------+---------------+---------+-----------+----------+--------------+ PTV      Full                                                        +---------+---------------+---------+-----------+----------+--------------+ PERO     Full                                                        +---------+---------------+---------+-----------+----------+--------------+     Summary: BILATERAL: - No evidence of deep vein thrombosis seen in the lower extremities, bilaterally. -No evidence of popliteal cyst, bilaterally.   *See table(s) above for measurements and observations.    Preliminary    CT Angio Chest  PE W and/or Wo Contrast  Addendum Date: 04/04/2022   ADDENDUM REPORT: 04/04/2022 22:44 ADDENDUM: Critical Value/emergent results were called by telephone at the time of interpretation on 04/04/2022 at 10:44 pm to provider DAVID YAO , who verbally acknowledged these results. Electronically Signed   By: Brett Fairy M.D.   On: 04/04/2022 22:44   Result Date: 04/04/2022 CLINICAL DATA:  Pulmonary embolism suspected, high probability. Hypotension, stroke suspected. EXAM: CT ANGIOGRAPHY CHEST WITH CONTRAST TECHNIQUE: Multidetector CT imaging of the chest was performed using the standard protocol during bolus administration of intravenous contrast. Multiplanar CT image reconstructions and MIPs were obtained to evaluate the vascular anatomy. RADIATION DOSE REDUCTION: This exam was performed according to the departmental dose-optimization program which includes automated exposure control, adjustment of the mA and/or kV according to patient size and/or use of iterative reconstruction technique. CONTRAST:  58m OMNIPAQUE IOHEXOL 350 MG/ML SOLN COMPARISON:  None Available. FINDINGS: Cardiovascular: The heart is mildly enlarged and there is no pericardial effusion. Multi-vessel coronary artery calcifications are seen. There is atherosclerotic calcification of the aorta without evidence of aneurysm. The pulmonary trunk is normal in caliber. Small lobar, segmental, and subsegmental pulmonary artery filling defects in the left lower lobe and segmental and subsegmental pulmonary emboli in the right lower lobe. No evidence of right heart strain. Mediastinum/Nodes: No mediastinal, hilar, or axillary lymphadenopathy. The thyroid gland, trachea, and esophagus are within normal limits. Lungs/Pleura: There is a trace left pleural effusion. Bronchial wall thickening  is noted bilaterally with patchy atelectasis or infiltrate at the lung bases. No pneumothorax Upper Abdomen: Stones are present within the gallbladder. There is a cyst in  the right kidney. No acute abnormality. Musculoskeletal: There are compression deformities at T6, T8, T11, and T12, indeterminate in age. Degenerative changes are present in the thoracic spine. Review of the MIP images confirms the above findings. IMPRESSION: 1. Bilateral lower lobe pulmonary emboli. No evidence of right heart strain. 2. Patchy atelectasis or infiltrate at the lung bases. 3. Trace left pleural effusion. 4. Cholelithiasis. 5. Multilevel compression deformities in the thoracic spine, indeterminate in age. 6. Coronary artery calcifications. 7. Aortic atherosclerosis. Electronically Signed: By: Brett Fairy M.D. On: 04/04/2022 22:41   CT HEAD WO CONTRAST (5MM)  Result Date: 04/04/2022 CLINICAL DATA:  Acute neurologic deficit EXAM: CT HEAD WITHOUT CONTRAST TECHNIQUE: Contiguous axial images were obtained from the base of the skull through the vertex without intravenous contrast. RADIATION DOSE REDUCTION: This exam was performed according to the departmental dose-optimization program which includes automated exposure control, adjustment of the mA and/or kV according to patient size and/or use of iterative reconstruction technique. COMPARISON:  None Available. FINDINGS: Brain: There is no mass, hemorrhage or extra-axial collection. There is generalized atrophy without lobar predilection. Hypodensity of the white matter is most commonly associated with chronic microvascular disease. Vascular: Atherosclerotic calcification of the vertebral and internal carotid arteries at the skull base. No abnormal hyperdensity of the major intracranial arteries or dural venous sinuses. Skull: The visualized skull base, calvarium and extracranial soft tissues are normal. Sinuses/Orbits: No fluid levels or advanced mucosal thickening of the visualized paranasal sinuses. No mastoid or middle ear effusion. The orbits are normal. IMPRESSION: 1. No acute intracranial abnormality. 2. Generalized atrophy and findings of  chronic microvascular disease. Electronically Signed   By: Ulyses Jarred M.D.   On: 04/04/2022 22:36   DG Chest Port 1 View  Result Date: 04/04/2022 CLINICAL DATA:  Possible sepsis.  Weakness for 2 weeks. EXAM: PORTABLE CHEST 1 VIEW COMPARISON:  07/11/2016. FINDINGS: The heart size and mediastinal contours are stable. There is atherosclerotic calcification of the aorta. Lung volumes are low. There is patchy airspace disease at the left lung base. No effusion or pneumothorax. A stable compression deformity is present in the mid thoracic spine. No acute osseous abnormality. IMPRESSION: Patchy atelectasis or infiltrate at the left lung base. Electronically Signed   By: Brett Fairy M.D.   On: 04/04/2022 20:08    Pending Labs Unresulted Labs (From admission, onward)     Start     Ordered   04/06/22 0500  Heparin level (unfractionated)  Daily,   R     See Hyperspace for full Linked Orders Report.   04/04/22 2300   04/06/22 0500  CBC  Daily,   R     See Hyperspace for full Linked Orders Report.   04/04/22 2300   04/06/22 4010  Basic metabolic panel  Daily,   R      04/05/22 0915   04/05/22 1800  Heparin level (unfractionated)  Once-Timed,   TIMED        04/05/22 0959   04/04/22 1946  Urine Culture  (Septic presentation on arrival (screening labs, nursing and treatment orders for obvious sepsis))  ONCE - URGENT,   URGENT       Question:  Indication  Answer:  Dysuria   04/04/22 1946            Vitals/Pain Today's Vitals  04/05/22 1045 04/05/22 1115 04/05/22 1130 04/05/22 1200  BP: (!) 105/57 (!) 101/52 111/63   Pulse: (!) 57 (!) 51 84   Resp: 14 15 (!) 23   Temp:    (!) 96.9 F (36.1 C)  TempSrc:    Axillary  SpO2: 100% 98% 99%   Weight:      Height:      PainSc:        Isolation Precautions No active isolations  Medications Medications  heparin ADULT infusion 100 units/mL (25000 units/232m) (1,150 Units/hr Intravenous Rate/Dose Change 04/05/22 1119)  acetaminophen  (TYLENOL) tablet 650 mg (has no administration in time range)    Or  acetaminophen (TYLENOL) suppository 650 mg (has no administration in time range)  senna-docusate (Senokot-S) tablet 1 tablet (has no administration in time range)  ondansetron (ZOFRAN) tablet 4 mg (has no administration in time range)    Or  ondansetron (ZOFRAN) injection 4 mg (has no administration in time range)  atorvastatin (LIPITOR) tablet 20 mg (20 mg Oral Given 04/05/22 1128)  levETIRAcetam (KEPPRA) tablet 250 mg (250 mg Oral Given 04/05/22 1135)  memantine (NAMENDA) tablet 5 mg (5 mg Oral Given 04/05/22 1127)  tamsulosin (FLOMAX) capsule 0.4 mg (0.4 mg Oral Given 04/05/22 1126)  aspirin EC tablet 81 mg (81 mg Oral Given 04/05/22 1127)  isosorbide mononitrate (IMDUR) 24 hr tablet 30 mg (30 mg Oral Given 04/05/22 1135)  0.9 %  sodium chloride infusion ( Intravenous New Bag/Given 04/05/22 1127)  cyanocobalamin (VITAMIN B12) tablet 500 mcg (has no administration in time range)  lactated ringers bolus 1,000 mL (0 mLs Intravenous Stopped 04/04/22 2249)  ceFEPIme (MAXIPIME) 2 g in sodium chloride 0.9 % 100 mL IVPB (0 g Intravenous Stopped 04/04/22 2058)  metroNIDAZOLE (FLAGYL) IVPB 500 mg (0 mg Intravenous Stopped 04/04/22 2249)  vancomycin (VANCOREADY) IVPB 1250 mg/250 mL (0 mg Intravenous Stopped 04/04/22 2250)  iohexol (OMNIPAQUE) 350 MG/ML injection 55 mL (55 mLs Intravenous Contrast Given 04/04/22 2224)  heparin bolus via infusion 3,000 Units (3,000 Units Intravenous Bolus from Bag 04/04/22 2308)    Mobility manual wheelchair     Focused Assessments Pulmonary Assessment Handoff:  Lung sounds: Bilateral Breath Sounds: Diminished, Clear L Breath Sounds: Diminished R Breath Sounds: Diminished O2 Device: Nasal Cannula O2 Flow Rate (L/min): 2 L/min    R Recommendations: See Admitting Provider Note  Report given to: 4E16  Additional Notes: Pt swallows pills crushed in apple sauce well, does not do good with thin liq  (per speech).

## 2022-04-05 NOTE — Progress Notes (Signed)
  Echocardiogram 2D Echocardiogram has been performed.  Tom Ortiz 04/05/2022, 11:59 AM

## 2022-04-06 DIAGNOSIS — I482 Chronic atrial fibrillation, unspecified: Secondary | ICD-10-CM | POA: Diagnosis not present

## 2022-04-06 DIAGNOSIS — N179 Acute kidney failure, unspecified: Secondary | ICD-10-CM | POA: Diagnosis not present

## 2022-04-06 DIAGNOSIS — I2699 Other pulmonary embolism without acute cor pulmonale: Secondary | ICD-10-CM | POA: Diagnosis not present

## 2022-04-06 LAB — URINE CULTURE: Culture: NO GROWTH

## 2022-04-06 LAB — BASIC METABOLIC PANEL
Anion gap: 4 — ABNORMAL LOW (ref 5–15)
BUN: 56 mg/dL — ABNORMAL HIGH (ref 8–23)
CO2: 23 mmol/L (ref 22–32)
Calcium: 7.2 mg/dL — ABNORMAL LOW (ref 8.9–10.3)
Chloride: 109 mmol/L (ref 98–111)
Creatinine, Ser: 1.66 mg/dL — ABNORMAL HIGH (ref 0.61–1.24)
GFR, Estimated: 38 mL/min — ABNORMAL LOW (ref >60)
Glucose, Bld: 97 mg/dL (ref 70–99)
Potassium: 4.4 mmol/L (ref 3.5–5.1)
Sodium: 136 mmol/L (ref 135–145)

## 2022-04-06 LAB — CBC
HCT: 23.8 % — ABNORMAL LOW (ref 39.0–52.0)
Hemoglobin: 8 g/dL — ABNORMAL LOW (ref 13.0–17.0)
MCH: 37.4 pg — ABNORMAL HIGH (ref 26.0–34.0)
MCHC: 33.6 g/dL (ref 30.0–36.0)
MCV: 111.2 fL — ABNORMAL HIGH (ref 80.0–100.0)
Platelets: 166 10*3/uL (ref 150–400)
RBC: 2.14 MIL/uL — ABNORMAL LOW (ref 4.22–5.81)
RDW: 15 % (ref 11.5–15.5)
WBC: 6.4 10*3/uL (ref 4.0–10.5)
nRBC: 0 % (ref 0.0–0.2)

## 2022-04-06 LAB — MAGNESIUM: Magnesium: 2.6 mg/dL — ABNORMAL HIGH (ref 1.7–2.4)

## 2022-04-06 LAB — HEPARIN LEVEL (UNFRACTIONATED): Heparin Unfractionated: 0.51 IU/mL (ref 0.30–0.70)

## 2022-04-06 LAB — GLUCOSE, CAPILLARY: Glucose-Capillary: 103 mg/dL — ABNORMAL HIGH (ref 70–99)

## 2022-04-06 MED ORDER — SODIUM CHLORIDE 0.9 % IV SOLN: INTRAVENOUS | Status: AC

## 2022-04-06 MED ORDER — METOPROLOL TARTRATE 5 MG/5ML IV SOLN: 2.5000 mg | Freq: Once | INTRAVENOUS | Status: DC

## 2022-04-06 MED ORDER — SODIUM CHLORIDE 0.9 % IV BOLUS
500.0000 mL | Freq: Once | INTRAVENOUS | Status: AC
  Administered 2022-04-06: 500 mL via INTRAVENOUS

## 2022-04-06 MED ORDER — METOPROLOL TARTRATE 5 MG/5ML IV SOLN
2.5000 mg | Freq: Four times a day (QID) | INTRAVENOUS | Status: DC | PRN
  Administered 2022-04-06: 2.5 mg via INTRAVENOUS
  Filled 2022-04-06: qty 5

## 2022-04-06 MED ORDER — AMIODARONE IV BOLUS ONLY 150 MG/100ML
150.0000 mg | Freq: Once | INTRAVENOUS | Status: AC
  Administered 2022-04-06: 150 mg via INTRAVENOUS
  Filled 2022-04-06: qty 100

## 2022-04-06 MED ORDER — CHLORHEXIDINE GLUCONATE CLOTH 2 % EX PADS
6.0000 | MEDICATED_PAD | Freq: Every day | CUTANEOUS | Status: DC
  Administered 2022-04-06 – 2022-04-09 (×4): 6 via TOPICAL

## 2022-04-06 NOTE — Progress Notes (Signed)
ANTICOAGULATION CONSULT NOTE - Follow Up Consult  Pharmacy Consult for Heparin Indication: pulmonary embolus  Allergies  Allergen Reactions   Fosamax [Alendronate] Other (See Comments)    Unknown   Vilazodone Hcl Nausea Only   Quinolones Hives and Rash    89 yr old allergy to quinine    Patient Measurements: Height: '5\' 10"'$  (177.8 cm) Weight: 67.1 kg (147 lb 14.4 oz) IBW/kg (Calculated) : 73 Heparin Dosing Weight: 67.1 kg  Vital Signs: Temp: 98 F (36.7 C) (01/22 0808) Temp Source: Oral (01/22 0808) BP: 114/67 (01/22 0808) Pulse Rate: 85 (01/22 0808)  Labs: Recent Labs    04/04/22 1946 04/04/22 2315 04/05/22 0221 04/05/22 0755 04/05/22 1810 04/06/22 0050  HGB 9.0*  --  8.4*  --   --  8.0*  HCT 28.1*  --  25.6*  --   --  23.8*  PLT 174  --  172  --   --  166  APTT 32  --   --   --   --   --   LABPROT 16.4*  --   --   --   --   --   INR 1.3*  --   --   --   --   --   HEPARINUNFRC  --   --   --  0.33 0.51 0.51  CREATININE 1.81*  --  1.68*  --   --  1.66*  TROPONINIHS 215* 199*  --   --   --   --     Estimated Creatinine Clearance: 26.9 mL/min (A) (by C-G formula based on SCr of 1.66 mg/dL (H)).  Assessment: 89 yom with a history of HTN and Afib. Patient is presented 04/04/22 with weakness. Heparin per pharmacy consult for pulmonary embolus per CTA on 04/04/22  Heparin level remains therapeutic (0.51) on 1150 units/hr. Hgb trended down from 9>8, possibly dilutional. On IV fluids for AKI. No bleeding reported.   Goal of Therapy:  Heparin level 0.3-0.7 units/ml Monitor platelets by anticoagulation protocol: Yes   Plan:  Continue heparin drip at 1150 units/hr. Daily heparin level and CBC while on heparin. Follow up oral anticoagulation plans. Monitor for signs/symptoms of bleeding.   Arty Baumgartner, RPh 04/06/2022,9:58 AM

## 2022-04-06 NOTE — Progress Notes (Addendum)
Rounding Note    Patient Name: LAURIER JASPERSON Date of Encounter: 04/06/2022  [place] Cardiologist: Shelva Majestic, MD   Subjective   Confused; No CP or dyspnea  Inpatient Medications    Scheduled Meds:  aspirin EC  81 mg Oral Daily   atorvastatin  20 mg Oral Daily   Chlorhexidine Gluconate Cloth  6 each Topical Daily   vitamin B-12  500 mcg Oral Daily   levETIRAcetam  250 mg Oral BID   memantine  5 mg Oral BID   tamsulosin  0.4 mg Oral Daily   Continuous Infusions:  sodium chloride 75 mL/hr at 04/05/22 1127   heparin 1,150 Units/hr (04/05/22 1916)   PRN Meds: acetaminophen **OR** acetaminophen, magic mouthwash, ondansetron **OR** ondansetron (ZOFRAN) IV, senna-docusate   Vital Signs    Vitals:   04/05/22 2143 04/05/22 2338 04/06/22 0336 04/06/22 0808  BP: 124/64  123/74 114/67  Pulse: 89   85  Resp: '20 19  19  '$ Temp: 98.6 F (37 C) 98 F (36.7 C) 98.2 F (36.8 C) 98 F (36.7 C)  TempSrc:  Oral Oral Oral  SpO2:    100%  Weight:      Height:        Intake/Output Summary (Last 24 hours) at 04/06/2022 0843 Last data filed at 04/06/2022 0600 Gross per 24 hour  Intake 858.28 ml  Output 1200 ml  Net -341.72 ml      04/04/2022    9:42 PM 12/03/2020   11:55 AM 07/24/2020   11:19 AM  Last 3 Weights  Weight (lbs) 147 lb 14.4 oz 143 lb 155 lb 9.6 oz  Weight (kg) 67.087 kg 64.864 kg 70.58 kg      Telemetry    Atrial fibrillation rate controlled - Personally Reviewed   Physical Exam   GEN: No acute distress.   Neck: No JVD Cardiac: irregular Respiratory: Clear to auscultation bilaterally. GI: Soft, nontender, non-distended  MS: No edema; No deformity. Neuro:  Confused; Nonfocal  Psych: Normal affect   Labs    High Sensitivity Troponin:   Recent Labs  Lab 04/04/22 1946 04/04/22 2315  TROPONINIHS 215* 199*     Chemistry Recent Labs  Lab 04/04/22 1946 04/05/22 0221 04/05/22 1530 04/06/22 0050  NA 138 137  --  136  K 4.5 4.3   --  4.4  CL 107 105  --  109  CO2 25 23  --  23  GLUCOSE 113* 112*  --  97  BUN 60* 58*  --  56*  CREATININE 1.81* 1.68*  --  1.66*  CALCIUM 7.7* 7.4*  --  7.2*  MG  --   --  2.0 2.6*  PROT 5.2*  --   --   --   ALBUMIN 1.7*  --   --   --   AST 38  --   --   --   ALT 19  --   --   --   ALKPHOS 96  --   --   --   BILITOT 1.0  --   --   --   GFRNONAA 35* 38*  --  38*  ANIONGAP 6 9  --  4*     Hematology Recent Labs  Lab 04/04/22 1946 04/05/22 0221 04/06/22 0050  WBC 8.0 8.5 6.4  RBC 2.44* 2.27* 2.14*  HGB 9.0* 8.4* 8.0*  HCT 28.1* 25.6* 23.8*  MCV 115.2* 112.8* 111.2*  MCH 36.9* 37.0* 37.4*  MCHC 32.0 32.8  33.6  RDW 15.4 15.3 15.0  PLT 174 172 166   Thyroid  Recent Labs  Lab 04/05/22 0221  TSH 1.091    BNPNo results for input(s): "BNP", "PROBNP" in the last 168 hours.  DDimer No results for input(s): "DDIMER" in the last 168 hours.   Radiology    DG Swallowing Func-Speech Pathology  Result Date: 04/05/2022 Table formatting from the original result was not included. Images from the original result were not included. Objective Swallowing Evaluation: Type of Study: MBS-Modified Barium Swallow Study  Patient Details Name: EMMONS TOTH MRN: [id number] Date of Birth: 1927/07/08 Today's Date: 04/05/2022 Time: SLP Start Time (ACUTE ONLY): 1506 -SLP Stop Time (ACUTE ONLY): 6948 SLP Time Calculation (min) (ACUTE ONLY): 11 min Past Medical History: Past Medical History: Diagnosis Date  Anxiety   Arthritis   Essential hypertension 04/16/2014  Hard of hearing   tinnitus both ears  Hypertension   Paroxysmal atrial fibrillation ([place]) 04/28/2014  Shoulder pain, left 07/14/2016  states left rotator pain preop Past Surgical History: Past Surgical History: Procedure Laterality Date  APPENDECTOMY  yrs ago  [place]  yrs ago  HIP CLOSED REDUCTION  07/01/2011, left  Procedure: [place];  Surgeon: Magnus Sinning, MD;  Location: [facility];  Service: Orthopedics;  Laterality: Left;   HIP CLOSED REDUCTION  06/11/2011  Procedure: CLOSED MANIPULATION HIP;  Surgeon: Magnus Sinning, MD;  Location: [facility];  Service: Orthopedics;  Laterality: Left;  HIP CLOSED REDUCTION  07/01/2011  Procedure: CLOSED MANIPULATION HIP;  Surgeon: Magnus Sinning, MD;  Location: [facility];  Service: Orthopedics;  Laterality: Left;  HIP CLOSED REDUCTION  12/08/2011  Procedure: CLOSED MANIPULATION HIP;  Surgeon: Sydnee Cabal, MD;  Location: [facility];  Service: Orthopedics;  Laterality: Left;  JOINT REPLACEMENT  2007 right, left 17 yrs ago  Bilateral hip  NM MYOVIEW LTD  2012  ORIF PERIPROSTHETIC FRACTURE Left 07/14/2016  Procedure: OPEN REDUCTION INTERNAL FIXATION (ORIF) PERIPROSTHETIC FRACTURE LEFT HIP;  Surgeon: Paralee Cancel, MD;  Location: [facility];  Service: Orthopedics;  Laterality: Left;  TONSILLECTOMY  as child  TOTAL HIP REVISION  10/02/2011  Procedure: TOTAL HIP REVISION;  Surgeon: Gearlean Alf, MD;  Location: [facility];  Service: Orthopedics;  Laterality: Right;  TOTAL HIP REVISION  01/27/2012  Procedure: TOTAL HIP REVISION;  Surgeon: Gearlean Alf, MD;  Location: [facility];  Service: Orthopedics;  Laterality: Left; HPI: Mr. Neidhardt is a 89 year old Caucasian male who was brought into the emergency department after EMS was called due to history of weakness ongoing for about 2 weeks.  Found to be in rapid a-fib and to have acute pulmonary embolism. CXR and Chest CT 1/20 with "Patchy atelectasis or infiltrate at the left lung base." Head CT 1/20 with no acute findings. Pt with past medical history of dementia, hearing impairment, paroxysmal atrial fibrillation not on anticoagulation.  Subjective: poor positioning  Recommendations for follow up therapy are one component of a multi-disciplinary discharge planning process, led by the attending physician.  Recommendations may be updated based on patient status, additional functional criteria and insurance authorization. Assessment / Plan / Recommendation   04/05/2022   3:54 PM  Clinical Impressions Clinical Impression Pt presents with a mild-moderate oropharyngeal dysphagia c/b lingual discoordination, delayed swallow initiation, incomplete laryngeal closure, seemingely reduced UES opening, and diminished sensation. Pt also with very poor positioning which appears to impact swallow function.  SLP had assist pt to hold his head up for PO trials during MBS. These  deficits resulted in silent aspiration of thin liquid before the swallow.  Cup presentation was not reliably beneficial to prevent aspiration.  Pt was unable to clear aspiration and did not follow instructions for cued cough.  With nectar thick liquid there was premature spillage to the pyriform sinuses but no penetration or aspiration with both cup and straw presentation.  Pt was unable to siphon honey thick liquid by straw.  There was no penetration of hone thick liquid by cup.  There was prolonged oral phase with reduced bolus cohesion.  Pt did not initiate swallow until bolus had fully transited from oral cavity with stasis of portions of bolus in pharyngeal stasis rather than use of piecemeal degultition.  With puree and regular solid texture there was no penetration or aspiration.  With solid texture in particular there was increased retention of contrast at UES, but no backflow to pharyx.  There was diffuse pharyngeal residue with all consistencies.  Pt is not a good candidate for swallowing therapy 2/2 inability to follow directions consistently. Recommend mehcanical soft diet with nectar thick liquids. SLP Visit Diagnosis Dysphagia, oropharyngeal phase (R13.12) Impact on safety and function Moderate aspiration risk     04/05/2022   3:54 PM Treatment Recommendations Treatment Recommendations Therapy as outlined in treatment plan below     04/05/2022   4:02 PM Prognosis Prognosis for Safe Diet Advancement Fair   04/05/2022   3:54 PM Diet Recommendations SLP Diet Recommendations Dysphagia 3 (Mech soft) solids;Nectar thick liquid  Liquid Administration via Cup;Straw Medication Administration Crushed with puree Compensations Slow rate;Small sips/bites;assist with positioning as needed Postural Changes Seated upright at 90 degrees     04/05/2022   3:54 PM Other Recommendations Oral Care Recommendations Oral care BID Other Recommendations Order thickener from pharmacy Follow Up Recommendations Skilled nursing-short term rehab (<3 hours/day) Functional Status Assessment Patient has had a recent decline in their functional status and demonstrates the ability to make significant improvements in function in a reasonable and predictable amount of time.   04/05/2022   3:54 PM Frequency and Duration  Speech Therapy Frequency (ACUTE ONLY) min 2x/week Treatment Duration 2 weeks     04/05/2022   3:44 PM Oral Phase Oral Phase Impaired Oral - Honey Cup Decreased bolus cohesion;Delayed oral transit;Lingual pumping Oral - Nectar Cup Premature spillage;Decreased bolus cohesion Oral - Nectar Straw Premature spillage;Decreased bolus cohesion Oral - Thin Cup Premature spillage;Decreased bolus cohesion Oral - Thin Straw Premature spillage Oral - Puree Premature spillage;Delayed oral transit Oral - Regular Impaired mastication;Delayed oral transit;Premature spillage;Decreased bolus cohesion    04/05/2022   3:53 PM Pharyngeal Phase Pharyngeal Material does not enter airway Pharyngeal- Nectar Cup Delayed swallow initiation-pyriform sinuses Pharyngeal Material does not enter airway Pharyngeal- Nectar Straw Delayed swallow initiation-pyriform sinuses Pharyngeal Material does not enter airway Pharyngeal- Thin Cup Delayed swallow initiation-pyriform sinuses;Reduced airway/laryngeal closure;Penetration/Aspiration before swallow Pharyngeal Material enters airway, passes BELOW cords without attempt by patient to eject out (silent aspiration) Pharyngeal- Thin Straw Delayed swallow initiation-pyriform sinuses;Reduced airway/laryngeal closure;Penetration/Aspiration before swallow  Pharyngeal Material enters airway, passes BELOW cords without attempt by patient to eject out (silent aspiration) Pharyngeal- Puree Delayed swallow initiation-vallecula;Pharyngeal residue - cp segment Pharyngeal Material does not enter airway Pharyngeal- Regular Delayed swallow initiation-vallecula;Pharyngeal residue - cp segment Pharyngeal Material does not enter airway     No data to display    Celedonio Savage, [place], [place] Office: [phone] 04/05/2022, 4:06 PM  ECHOCARDIOGRAM COMPLETE  Result Date: 04/05/2022    ECHOCARDIOGRAM REPORT   Patient Name:   HAMDI KLEY Date of Exam: 04/05/2022 Medical Rec #:  [id number]        Height:       70.0 in Accession #:    [id number]       Weight:       147.9 lb Date of Birth:  Sep 10, 1927         BSA:          1.836 m Patient Age:    24 years         BP:           101/52 mmHg Patient Gender: M                HR:           70 bpm. Exam Location:  Inpatient Procedure: 2D Echo Indications:    atrial fibrillation  History:        Patient has prior history of Echocardiogram examinations, most                 recent 06/30/2020. CHF; Risk Factors:Hypertension and                 Dyslipidemia.  Sonographer:    Harvie Junior Referring Phys: [id number] DEBBY CROSLEY  Sonographer Comments: Technically difficult study due to poor echo windows and no subcostal window. Image acquisition challenging due to uncooperative patient and Image acquisition challenging due to respiratory motion. IMPRESSIONS  1. Technically difficult study. Left ventricular ejection fraction, by estimation, is 50 to 55%. The left ventricle has low normal function. Left ventricular endocardial border not optimally defined to evaluate regional wall motion. There is mild left ventricular hypertrophy. Left ventricular diastolic parameters are indeterminate.  2. Right ventricular systolic function is normal. The right ventricular size is normal.  3. The mitral valve is grossly  normal. Trivial mitral valve regurgitation. No evidence of mitral stenosis.  4. The aortic valve was not well visualized. Aortic valve regurgitation is trivial. Mild aortic valve stenosis. FINDINGS  Left Ventricle: Left ventricular ejection fraction, by estimation, is 50 to 55%. The left ventricle has low normal function. Left ventricular endocardial border not optimally defined to evaluate regional wall motion. The left ventricular internal cavity  size was small. There is mild left ventricular hypertrophy. Left ventricular diastolic parameters are indeterminate. Right Ventricle: The right ventricular size is normal. Right vetricular wall thickness was not well visualized. Right ventricular systolic function is normal. The tricuspid regurgitant velocity is 2.15 m/s, and with an assumed right atrial pressure of 3 mmHg, the estimated right ventricular systolic pressure is 78.4 mmHg. Left Atrium: Left atrial size was normal in size. Right Atrium: Right atrial size was normal in size. Pericardium: Trivial pericardial effusion is present. Presence of epicardial fat layer. Mitral Valve: The mitral valve is grossly normal. Trivial mitral valve regurgitation. No evidence of mitral valve stenosis. Tricuspid Valve: The tricuspid valve is not well visualized. Tricuspid valve regurgitation is not demonstrated. Aortic Valve: The aortic valve was not well visualized. Aortic valve regurgitation is trivial. Aortic regurgitation PHT measures 296 msec. Mild aortic stenosis is present. Aortic valve mean gradient measures 7.4 mmHg. Aortic valve peak gradient measures 14.5 mmHg. Aortic valve area, by VTI measures 1.39 cm. Pulmonic Valve: The pulmonic valve was not well visualized. Pulmonic valve regurgitation is not visualized. Aorta: The aortic root is normal in size and structure. IAS/Shunts: The interatrial septum was not  well visualized.  LEFT VENTRICLE PLAX 2D LVIDd:         3.20 cm     Diastology LVIDs:         2.00 cm     LV e'  medial:    6.31 cm/s LV PW:         1.10 cm     LV E/e' medial:  13.1 LV IVS:        1.20 cm     LV e' lateral:   7.40 cm/s LVOT diam:     2.10 cm     LV E/e' lateral: 11.2 LV SV:         50 LV SV Index:   27 LVOT Area:     3.46 cm                             3D Volume EF: LV Volumes (MOD)           3D EF:        46 % LV vol d, MOD A2C: 82.7 ml LV EDV:       109 ml LV vol d, MOD A4C: 68.6 ml LV ESV:       59 ml LV vol s, MOD A2C: 36.9 ml LV SV:        50 ml LV vol s, MOD A4C: 31.3 ml LV SV MOD A2C:     45.8 ml LV SV MOD A4C:     68.6 ml LV SV MOD BP:      41.0 ml RIGHT VENTRICLE RV Basal diam:  3.20 cm RV Mid diam:    3.10 cm RV S prime:     13.37 cm/s TAPSE (M-mode): 1.7 cm LEFT ATRIUM             Index        RIGHT ATRIUM           Index LA Vol (A2C):   48.7 ml 26.52 ml/m  RA Area:     11.90 cm LA Vol (A4C):   48.9 ml 26.63 ml/m  RA Volume:   22.40 ml  12.20 ml/m LA Biplane Vol: 49.7 ml 27.07 ml/m  AORTIC VALVE                     PULMONIC VALVE AV Area (Vmax):    1.40 cm      PV Vmax:       0.92 m/s AV Area (Vmean):   1.43 cm      PV Peak grad:  3.4 mmHg AV Area (VTI):     1.39 cm AV Vmax:           190.40 cm/s AV Vmean:          128.000 cm/s AV VTI:            0.360 m AV Peak Grad:      14.5 mmHg AV Mean Grad:      7.4 mmHg LVOT Vmax:         77.20 cm/s LVOT Vmean:        52.750 cm/s LVOT VTI:          0.145 m LVOT/AV VTI ratio: 0.40 AI PHT:            296 msec  AORTA Ao Root diam: 3.80 cm MITRAL VALVE               TRICUSPID  VALVE MV Area (PHT): 3.42 cm    TR Peak grad:   18.5 mmHg MV Decel Time: 222 msec    TR Vmax:        215.00 cm/s MR Peak grad: 27.0 mmHg MR Vmax:      260.00 cm/s  SHUNTS MV E velocity: 82.70 cm/s  Systemic VTI:  0.14 m MV A velocity: 58.50 cm/s  Systemic Diam: 2.10 cm MV E/A ratio:  1.41 Oswaldo Milian MD Electronically signed by Oswaldo Milian MD Signature Date/Time: 04/05/2022/2:16:29 PM    Final    VAS [place] LOWER EXTREMITY VENOUS (DVT)  Result Date: 04/05/2022   Lower Venous DVT Study Patient Name:  TIANT PEIXOTO [facility]  Date of Exam:   04/05/2022 Medical Rec #: [id number]         Accession #:    [id number] Date of Birth: May 08, 1927          Patient Gender: M Patient Age:   86 years Exam Location:  [facility] Procedure:      VAS [place] LOWER EXTREMITY VENOUS (DVT) Referring Phys: DEBBY CROSLEY --------------------------------------------------------------------------------  Indications: Pulmonary embolism.  Comparison Study: no prior Performing Technologist: Archie Patten RVS  Examination Guidelines: A complete evaluation includes B-mode imaging, spectral Doppler, color Doppler, and power Doppler as needed of all accessible portions of each vessel. Bilateral testing is considered an integral part of a complete examination. Limited examinations for reoccurring indications may be performed as noted. The reflux portion of the exam is performed with the patient in reverse Trendelenburg.  +---------+---------------+---------+-----------+----------+--------------+ RIGHT    CompressibilityPhasicitySpontaneityPropertiesThrombus Aging +---------+---------------+---------+-----------+----------+--------------+ CFV      Full           Yes      Yes                                 +---------+---------------+---------+-----------+----------+--------------+ SFJ      Full                                                        +---------+---------------+---------+-----------+----------+--------------+ FV Prox  Full                                                        +---------+---------------+---------+-----------+----------+--------------+ FV Mid   Full                                                        +---------+---------------+---------+-----------+----------+--------------+ FV DistalFull           Yes      Yes                                 +---------+---------------+---------+-----------+----------+--------------+ PFV      Full                                                         +---------+---------------+---------+-----------+----------+--------------+  POP      Full           Yes      Yes                                 +---------+---------------+---------+-----------+----------+--------------+ PTV      Full           Yes      Yes                                 +---------+---------------+---------+-----------+----------+--------------+ PERO     Full           Yes      Yes                                 +---------+---------------+---------+-----------+----------+--------------+   +---------+---------------+---------+-----------+----------+--------------+ LEFT     CompressibilityPhasicitySpontaneityPropertiesThrombus Aging +---------+---------------+---------+-----------+----------+--------------+ CFV      Full           Yes      Yes                                 +---------+---------------+---------+-----------+----------+--------------+ SFJ      Full                                                        +---------+---------------+---------+-----------+----------+--------------+ FV Prox  Full                                                        +---------+---------------+---------+-----------+----------+--------------+ FV Mid   Full                                                        +---------+---------------+---------+-----------+----------+--------------+ FV DistalFull                                                        +---------+---------------+---------+-----------+----------+--------------+ PFV      Full                                                        +---------+---------------+---------+-----------+----------+--------------+ POP      Full           Yes      Yes                                 +---------+---------------+---------+-----------+----------+--------------+  PTV      Full                                                         +---------+---------------+---------+-----------+----------+--------------+ PERO     Full                                                        +---------+---------------+---------+-----------+----------+--------------+     Summary: BILATERAL: - No evidence of deep vein thrombosis seen in the lower extremities, bilaterally. -No evidence of popliteal cyst, bilaterally.   *See table(s) above for measurements and observations. Electronically signed by Harold Barban MD on 04/05/2022 at 2:13:44 PM.    Final    CT Angio Chest PE W and/or Wo Contrast  Addendum Date: 04/04/2022   ADDENDUM REPORT: 04/04/2022 22:44 ADDENDUM: Critical Value/emergent results were called by telephone at the time of interpretation on 04/04/2022 at 10:44 pm to provider DAVID YAO , who verbally acknowledged these results. Electronically Signed   By: Brett Fairy M.D.   On: 04/04/2022 22:44   Result Date: 04/04/2022 CLINICAL DATA:  Pulmonary embolism suspected, high probability. Hypotension, stroke suspected. EXAM: CT ANGIOGRAPHY CHEST WITH CONTRAST TECHNIQUE: Multidetector CT imaging of the chest was performed using the standard protocol during bolus administration of intravenous contrast. Multiplanar CT image reconstructions and MIPs were obtained to evaluate the vascular anatomy. RADIATION DOSE REDUCTION: This exam was performed according to the departmental dose-optimization program which includes automated exposure control, adjustment of the mA and/or kV according to patient size and/or use of iterative reconstruction technique. CONTRAST:  38m OMNIPAQUE IOHEXOL 350 MG/ML SOLN COMPARISON:  None Available. FINDINGS: Cardiovascular: The heart is mildly enlarged and there is no pericardial effusion. Multi-vessel coronary artery calcifications are seen. There is atherosclerotic calcification of the aorta without evidence of aneurysm. The pulmonary trunk is normal in caliber. Small lobar, segmental, and subsegmental pulmonary  artery filling defects in the left lower lobe and segmental and subsegmental pulmonary emboli in the right lower lobe. No evidence of right heart strain. Mediastinum/Nodes: No mediastinal, hilar, or axillary lymphadenopathy. The thyroid gland, trachea, and esophagus are within normal limits. Lungs/Pleura: There is a trace left pleural effusion. Bronchial wall thickening is noted bilaterally with patchy atelectasis or infiltrate at the lung bases. No pneumothorax Upper Abdomen: Stones are present within the gallbladder. There is a cyst in the right kidney. No acute abnormality. Musculoskeletal: There are compression deformities at T6, T8, T11, and T12, indeterminate in age. Degenerative changes are present in the thoracic spine. Review of the MIP images confirms the above findings. IMPRESSION: 1. Bilateral lower lobe pulmonary emboli. No evidence of right heart strain. 2. Patchy atelectasis or infiltrate at the lung bases. 3. Trace left pleural effusion. 4. Cholelithiasis. 5. Multilevel compression deformities in the thoracic spine, indeterminate in age. 6. Coronary artery calcifications. 7. Aortic atherosclerosis. Electronically Signed: By: LBrett FairyM.D. On: 04/04/2022 22:41   CT HEAD WO CONTRAST (5MM)  Result Date: 04/04/2022 CLINICAL DATA:  Acute neurologic deficit EXAM: CT HEAD WITHOUT CONTRAST TECHNIQUE: Contiguous axial images were obtained from the base of the skull through the vertex without intravenous contrast. RADIATION DOSE REDUCTION: This  exam was performed according to the departmental dose-optimization program which includes automated exposure control, adjustment of the mA and/or kV according to patient size and/or use of iterative reconstruction technique. COMPARISON:  None Available. FINDINGS: Brain: There is no mass, hemorrhage or extra-axial collection. There is generalized atrophy without lobar predilection. Hypodensity of the white matter is most commonly associated with chronic  microvascular disease. Vascular: Atherosclerotic calcification of the vertebral and internal carotid arteries at the skull base. No abnormal hyperdensity of the major intracranial arteries or dural venous sinuses. Skull: The visualized skull base, calvarium and extracranial soft tissues are normal. Sinuses/Orbits: No fluid levels or advanced mucosal thickening of the visualized paranasal sinuses. No mastoid or middle ear effusion. The orbits are normal. IMPRESSION: 1. No acute intracranial abnormality. 2. Generalized atrophy and findings of chronic microvascular disease. Electronically Signed   By: Ulyses Jarred M.D.   On: 04/04/2022 22:36   DG Chest Port 1 View  Result Date: 04/04/2022 CLINICAL DATA:  Possible sepsis.  Weakness for 2 weeks. EXAM: PORTABLE CHEST 1 VIEW COMPARISON:  07/11/2016. FINDINGS: The heart size and mediastinal contours are stable. There is atherosclerotic calcification of the aorta. Lung volumes are low. There is patchy airspace disease at the left lung base. No effusion or pneumothorax. A stable compression deformity is present in the mid thoracic spine. No acute osseous abnormality. IMPRESSION: Patchy atelectasis or infiltrate at the left lung base. Electronically Signed   By: Brett Fairy M.D.   On: 04/04/2022 20:08    Patient Profile     89 y.o. male with past medical history of dementia, paroxysmal atrial fibrillation, heart failure reduced EF, hypertension admitted with pulmonary embolus for evaluation of atrial fibrillation with rapid ventricular response.  CTA this admission shows bilateral lower lobe pulmonary emboli and coronary calcification.  Lower extremity venous Dopplers no DVT.  Echocardiogram showed ejection fraction 50 to 55%, mild left ventricular hypertrophy and mild aortic stenosis but mean gradient noted to be 7.4 mmHg.  Assessment & Plan    1 persistent atrial fibrillation-patient remains in atrial fibrillation but his heart rate is controlled on the  medications and he appears to be asymptomatic though history is difficult due to dementia.  Would avoid AV nodal blocking agents at this point.  Would not plan to pursue cardioversion as he appears to be asymptomatic.  2 acute pulmonary embolus-continue anticoagulation.  Will transition to apixaban per primary service.  3 elevated troponin-minimal elevation with no clear trend.  Patient also without chest pain.  Not consistent with acute coronary syndrome.  4 history of heart failure reduced EF-patient not volume overloaded on examination.  Would continue to hold diuretics.  5 history of coronary artery disease-patient denies chest pain.  Continue statin.  Would discontinue aspirin when apixaban is initiated.  6 acute kidney injury-Per primary service.  7 macrocytic anemia further evaluation per primary care.  8 no CODE BLUE  For questions or updates, please contact [facility] Please consult [email or web address] for contact info under        Signed, Kirk Ruths, MD  04/06/2022, 8:43 AM

## 2022-04-06 NOTE — Progress Notes (Addendum)
[facility] PROGRESS NOTE   Tom Ortiz PPJ:[id number] DOB: 01-15-1928 DOA: 04/04/2022  PCP: Pcp, No  Brief History/Interval Summary: 89 year old Caucasian male with past medical history of dementia, hearing impairment, paroxysmal atrial fibrillation not on anticoagulation who was brought into the emergency department after EMS was called due to history of weakness ongoing for about 2 weeks.  He was noted to be hypotensive and tachycardic when EMS evaluated him.  He was brought into the hospital for further management.  Found to be in rapid atrial fibrillation.  Blood pressure improved after fluid resuscitation.  He was found to have acute pulmonary embolism.  He was hospitalized for further management.  Consultants: Cardiology  Procedures: Echocardiogram    Subjective/Interval History: Patient is much more awake and alert this morning compared to yesterday.  Remains distracted and mildly confused.  Denies any abdominal pain or chest pain but does complain of upper back pain.      Assessment/Plan:  Atrial fibrillation with RVR with a known history of chronic atrial fibrillation Heart rate has improved after he was given fluids.  Tachyarrhythmia could have been brought on by his acute illness.  He is found to have acute pulmonary embolism.  TSH 1.09 Cardiology was consulted as well.  Previously not on anticoagulation due to age, anemia, dementia, frail status Seems to be stable currently.  Not on any rate limiting drugs at this time.  Defer to cardiology.  ADDENDUM Called by RN that the patient was tachycardic into the 180s.  This is after he worked with physical therapy.  Telemetry showed wide-complex tachycardia.  Patient is complaining of chest discomfort.  He was placed back into the bed.  Heart rate improved into the 130s.  EKG suggest atrial fibrillation.  He likely has some aberrancy.  He will be given metoprolol intravenously.  Blood pressure noted to be soft at 580  systolic.  If heart rate does not improve and blood pressure remains low we may have to pursue amiodarone.  Continue to monitor.  Acute pulmonary embolism/acute respiratory failure with hypoxia Currently requiring 2 L of oxygen by nasal cannula. CT angiogram showed pulmonary embolism. Patient was started on heparin. No DVT noted on lower extremity Doppler studies. Echocardiogram shows LVEF to be 50 to 55%.  Right ventricular function was noted to be normal.  No significant valvular abnormalities noted.  Mild AS was noted. Patient was also empirically started on antibiotics at the time of admission but no infection identified.  Antibiotics were discontinued. Patient noted to be hemodynamically stable over the last 24 hours.   Drop in hemoglobin is likely dilutional.  Due to his dementia and other comorbidities he is not really a candidate for invasive GI workup.  Discussed with Son. Will plan to transition to Apixaban tomorrow.  Hypotension, unspecified Could have been due to hypovolemia as he did have acute kidney injury.  Perhaps has had poor oral intake since he has been weak for 2 weeks.  Blood pressure improved with fluid resuscitation Lactic acid levels improved. Continue to monitor for now.  Acute kidney injury As of 2022 is baseline creatinine was around 1.19.   Came in with creatinine of 1.81.  Thought to be due to hypovolemia.  Patient was given IV fluids.  Creatinine improved to 1.66.  Monitor urine output.  Avoid nephrotoxic agents.   Continue IV fluids for another 24 hours. Foley catheter was placed in the emergency department.  Continue with Flomax.  Will attempt voiding trial prior to discharge.  Unspecified abdominal pain, chronic [name] mentions longstanding abdominal pain.  He is wonders if this could be the reason for patient's poor appetite. Will proceed with CT of the abdomen pelvis once his renal function has improved to see if there is any obvious abnormality  that can be detected.   Chronic systolic CHF EF based on echo from 2022 is 30 to 35%.  Regional wall motion abnormalities and global hypokinesis was noted at that time. Echocardiogram done during this admission showed improved EF at 50 to 55%. Due to his cognitive deficits/dementia not a candidate for any aggressive cardiac interventions. Mildly elevated troponin levels are secondary to PE, tachyarrhythmia and hypotension.  Unlikely to be ACS.  History of dementia with some delirium Continue Namenda.  Seizure disorder Informed that patient was not on keppra. Will discontinue.  Macrocytic anemia Baseline hemoglobin seems to be around 10-11.  Came in with hemoglobin of 9.   No evidence for overt bleeding.  Monitor closely while he is getting anticoagulation.  Folic acid level 19.5.  Vitamin B12 306.  Will start B12 supplementation. Initially plan was to check stool for occult blood.  However considering all of his comorbidities including dementia and poor baseline quality of life he is not a candidate for any endoscopies.  Will hold off on further testing.  History of closed intertrochanteric fracture of the left hip with delayed healing Has seen orthopedics previously.  Last surgery was 10 years ago.  Up until 2 weeks ago patient was ambulatory in the house using a walker.  Goals of care Long discussion with patient's son yesterday.  Patient has been declining for the past several weeks and has poor baseline quality of life.  His oral intake has been decreasing which he attributes to some abdominal issue which has been longstanding.  Patient's CODE STATUS was changed over to DNR/DNI. Had another discussion with patient's son today.  He is interested in talking with palliative medicine for goals of care conversation.   DVT Prophylaxis: On IV heparin Code Status: DNR Family Communication: No family at bedside Disposition Plan: To be determined  Status is: Inpatient Remains inpatient  appropriate because: Acute pulmonary embolism, atrial fibrillation with RVR    Medications: Scheduled:  aspirin EC  81 mg Oral Daily   atorvastatin  20 mg Oral Daily   Chlorhexidine Gluconate Cloth  6 each Topical Daily   vitamin B-12  500 mcg Oral Daily   levETIRAcetam  250 mg Oral BID   memantine  5 mg Oral BID   tamsulosin  0.4 mg Oral Daily   Continuous:  sodium chloride 75 mL/hr at 04/05/22 1127   heparin 1,150 Units/hr (04/05/22 1916)   KDT:OIZTIWPYKDXIP **OR** acetaminophen, magic mouthwash, ondansetron **OR** ondansetron (ZOFRAN) IV, senna-docusate  Antibiotics: Anti-infectives (From admission, onward)    Start     Dose/Rate Route Frequency Ordered Stop   04/05/22 2000  ceFEPIme (MAXIPIME) 2 g in sodium chloride 0.9 % 100 mL IVPB  Status:  Discontinued        2 g 200 mL/hr over 30 Minutes Intravenous Every 24 hours 04/04/22 2145 04/05/22 0915   04/04/22 2145  vancomycin variable dose per unstable renal function (pharmacist dosing)  Status:  Discontinued         Does not apply See admin instructions 04/04/22 2145 04/05/22 0915   04/04/22 2000  ceFEPIme (MAXIPIME) 2 g in sodium chloride 0.9 % 100 mL IVPB        2 g 200 mL/hr over 30  Minutes Intravenous  Once 04/04/22 1946 04/04/22 2058   04/04/22 2000  metroNIDAZOLE (FLAGYL) IVPB 500 mg        500 mg 100 mL/hr over 60 Minutes Intravenous  Once 04/04/22 1946 04/04/22 2249   04/04/22 2000  vancomycin (VANCOCIN) IVPB 1000 mg/200 mL premix  Status:  Discontinued        1,000 mg 200 mL/hr over 60 Minutes Intravenous  Once 04/04/22 1946 04/04/22 1949   04/04/22 2000  vancomycin (VANCOREADY) IVPB 1250 mg/250 mL        1,250 mg 166.7 mL/hr over 90 Minutes Intravenous  Once 04/04/22 1949 04/04/22 2250       Objective:  Vital Signs  Vitals:   04/05/22 2143 04/05/22 2338 04/06/22 0336 04/06/22 0808  BP: 124/64  123/74 114/67  Pulse: 89   85  Resp: '20 19  19  '$ Temp: 98.6 F (37 C) 98 F (36.7 C) 98.2 F (36.8 C) 98 F  (36.7 C)  TempSrc:  Oral Oral Oral  SpO2:    100%  Weight:      Height:        Intake/Output Summary (Last 24 hours) at 04/06/2022 0918 Last data filed at 04/06/2022 0600 Gross per 24 hour  Intake 858.28 ml  Output 1200 ml  Net -341.72 ml    Filed Weights   04/04/22 2142  Weight: 67.1 kg    General appearance: Awake alert.  In no distress.  Distracted Resp: Clear to auscultation bilaterally.  Normal effort Cardio: S1-S2 is irregularly irregular GI: Abdomen is soft.  Mild discomfort noted on palpation.  No rebound rigidity or guarding.  No masses organomegaly. Extremities: No edema.  Noted range of motion of both lower extremities Neurologic: Disoriented.  No obvious focal neurological deficits.   Lab Results:  Data Reviewed: I have personally reviewed following labs and reports of the imaging studies  CBC: Recent Labs  Lab 04/04/22 1946 04/05/22 0221 04/06/22 0050  WBC 8.0 8.5 6.4  NEUTROABS 5.0  --   --   HGB 9.0* 8.4* 8.0*  HCT 28.1* 25.6* 23.8*  MCV 115.2* 112.8* 111.2*  PLT 174 172 166     Basic Metabolic Panel: Recent Labs  Lab 04/04/22 1946 04/05/22 0221 04/05/22 1530 04/06/22 0050  NA 138 137  --  136  K 4.5 4.3  --  4.4  CL 107 105  --  109  CO2 25 23  --  23  GLUCOSE 113* 112*  --  97  BUN 60* 58*  --  56*  CREATININE 1.81* 1.68*  --  1.66*  CALCIUM 7.7* 7.4*  --  7.2*  MG  --   --  2.0 2.6*     GFR: Estimated Creatinine Clearance: 26.9 mL/min (A) (by C-G formula based on SCr of 1.66 mg/dL (H)).  Liver Function Tests: Recent Labs  Lab 04/04/22 1946  AST 38  ALT 19  ALKPHOS 96  BILITOT 1.0  PROT 5.2*  ALBUMIN 1.7*      Coagulation Profile: Recent Labs  Lab 04/04/22 1946  INR 1.3*       Thyroid Function Tests: Recent Labs    04/05/22 0221  TSH 1.091     Anemia Panel: Recent Labs    04/05/22 0221  VITAMINB12 306  FOLATE 15.7     Recent Results (from the past 240 hour(s))  Blood Culture (routine x 2)      Status: None (Preliminary result)   Collection Time: 04/04/22  8:00 PM  Specimen: BLOOD  Result Value Ref Range Status   Specimen Description BLOOD SITE NOT SPECIFIED  Final   Special Requests   Final    BOTTLES DRAWN AEROBIC AND ANAEROBIC Blood Culture adequate volume   Culture   Final    NO GROWTH 2 DAYS Performed at [facility], [address] [place], [place] [address]    Report Status PENDING  Incomplete  Resp panel by RT-PCR (RSV, Flu A&B, Covid) Anterior Nasal Swab     Status: None   Collection Time: 04/04/22  8:26 PM   Specimen: Anterior Nasal Swab  Result Value Ref Range Status   SARS Coronavirus 2 by RT PCR NEGATIVE NEGATIVE Final    Comment: (NOTE) SARS-CoV-2 target nucleic acids are NOT DETECTED.  The SARS-CoV-2 RNA is generally detectable in upper respiratory specimens during the acute phase of infection. The lowest concentration of SARS-CoV-2 viral copies this assay can detect is 138 copies/mL. A negative result does not preclude SARS-Cov-2 infection and should not be used as the sole basis for treatment or other patient management decisions. A negative result may occur with  improper specimen collection/handling, submission of specimen other than nasopharyngeal swab, presence of viral mutation(s) within the areas targeted by this assay, and inadequate number of viral copies(<138 copies/mL). A negative result must be combined with clinical observations, patient history, and epidemiological information. The expected result is Negative.  Fact Sheet for Patients:  EntrepreneurPulse.com.au  Fact Sheet for Healthcare Providers:  IncredibleEmployment.be  This test is no t yet approved or cleared by the [place] FDA and  has been authorized for detection and/or diagnosis of SARS-CoV-2 by FDA under an Emergency Use Authorization (EUA). This EUA will remain  in effect (meaning this test can be used) for the duration of  the COVID-19 declaration under Section 564(b)(1) of the Act, 21 U.S.C.section 360bbb-3(b)(1), unless the authorization is terminated  or revoked sooner.       Influenza A by PCR NEGATIVE NEGATIVE Final   Influenza B by PCR NEGATIVE NEGATIVE Final    Comment: (NOTE) The Xpert Xpress SARS-CoV-2/FLU/RSV plus assay is intended as an aid in the diagnosis of influenza from Nasopharyngeal swab specimens and should not be used as a sole basis for treatment. Nasal washings and aspirates are unacceptable for Xpert Xpress SARS-CoV-2/FLU/RSV testing.  Fact Sheet for Patients: EntrepreneurPulse.com.au  Fact Sheet for Healthcare Providers: IncredibleEmployment.be  This test is not yet approved or cleared by the [place] FDA and has been authorized for detection and/or diagnosis of SARS-CoV-2 by FDA under an Emergency Use Authorization (EUA). This EUA will remain in effect (meaning this test can be used) for the duration of the COVID-19 declaration under Section 564(b)(1) of the Act, 21 U.S.C. section 360bbb-3(b)(1), unless the authorization is terminated or revoked.     Resp Syncytial Virus by PCR NEGATIVE NEGATIVE Final    Comment: (NOTE) Fact Sheet for Patients: EntrepreneurPulse.com.au  Fact Sheet for Healthcare Providers: IncredibleEmployment.be  This test is not yet approved or cleared by the [place] FDA and has been authorized for detection and/or diagnosis of SARS-CoV-2 by FDA under an Emergency Use Authorization (EUA). This EUA will remain in effect (meaning this test can be used) for the duration of the COVID-19 declaration under Section 564(b)(1) of the Act, 21 U.S.C. section 360bbb-3(b)(1), unless the authorization is terminated or revoked.  Performed at [facility], [place] [address] [place], [place] [address]   Blood Culture (routine x 2)  Status: None (Preliminary result)    Collection Time: 04/04/22  8:26 PM   Specimen: BLOOD  Result Value Ref Range Status   Specimen Description BLOOD SITE NOT SPECIFIED  Final   Special Requests   Final    BOTTLES DRAWN AEROBIC AND ANAEROBIC Blood Culture adequate volume   Culture   Final    NO GROWTH 2 DAYS Performed at [facility], [address] [place], [place] [address]    Report Status PENDING  Incomplete      Radiology Studies: DG Swallowing Func-Speech Pathology  Result Date: 04/05/2022 Table formatting from the original result was not included. Images from the original result were not included. Objective Swallowing Evaluation: Type of Study: MBS-Modified Barium Swallow Study  Patient Details Name: Tom Ortiz MRN: [id number] Date of Birth: 1927/08/01 Today's Date: 04/05/2022 Time: SLP Start Time (ACUTE ONLY): 1506 -SLP Stop Time (ACUTE ONLY): 9833 SLP Time Calculation (min) (ACUTE ONLY): 11 min Past Medical History: Past Medical History: Diagnosis Date  Anxiety   Arthritis   Essential hypertension 04/16/2014  Hard of hearing   tinnitus both ears  Hypertension   Paroxysmal atrial fibrillation ([place]) 04/28/2014  Shoulder pain, left 07/14/2016  states left rotator pain preop Past Surgical History: Past Surgical History: Procedure Laterality Date  APPENDECTOMY  yrs ago  [place]  yrs ago  HIP CLOSED REDUCTION  07/01/2011, left  Procedure: [place];  Surgeon: Magnus Sinning, MD;  Location: [facility];  Service: Orthopedics;  Laterality: Left;  HIP CLOSED REDUCTION  06/11/2011  Procedure: CLOSED MANIPULATION HIP;  Surgeon: Magnus Sinning, MD;  Location: [facility];  Service: Orthopedics;  Laterality: Left;  HIP CLOSED REDUCTION  07/01/2011  Procedure: CLOSED MANIPULATION HIP;  Surgeon: Magnus Sinning, MD;  Location: [facility];  Service: Orthopedics;  Laterality: Left;  HIP CLOSED REDUCTION  12/08/2011  Procedure: CLOSED MANIPULATION HIP;  Surgeon: Sydnee Cabal, MD;  Location: [facility];  Service: Orthopedics;   Laterality: Left;  JOINT REPLACEMENT  2007 right, left 17 yrs ago  Bilateral hip  NM MYOVIEW LTD  2012  ORIF PERIPROSTHETIC FRACTURE Left 07/14/2016  Procedure: OPEN REDUCTION INTERNAL FIXATION (ORIF) PERIPROSTHETIC FRACTURE LEFT HIP;  Surgeon: Paralee Cancel, MD;  Location: [facility];  Service: Orthopedics;  Laterality: Left;  TONSILLECTOMY  as child  TOTAL HIP REVISION  10/02/2011  Procedure: TOTAL HIP REVISION;  Surgeon: Gearlean Alf, MD;  Location: [facility];  Service: Orthopedics;  Laterality: Right;  TOTAL HIP REVISION  01/27/2012  Procedure: TOTAL HIP REVISION;  Surgeon: Gearlean Alf, MD;  Location: [facility];  Service: Orthopedics;  Laterality: Left; HPI: Mr. Hayward is a 89 year old Caucasian male who was brought into the emergency department after EMS was called due to history of weakness ongoing for about 2 weeks.  Found to be in rapid a-fib and to have acute pulmonary embolism. CXR and Chest CT 1/20 with "Patchy atelectasis or infiltrate at the left lung base." Head CT 1/20 with no acute findings. Pt with past medical history of dementia, hearing impairment, paroxysmal atrial fibrillation not on anticoagulation.  Subjective: poor positioning  Recommendations for follow up therapy are one component of a multi-disciplinary discharge planning process, led by the attending physician.  Recommendations may be updated based on patient status, additional functional criteria and insurance authorization. Assessment / Plan / Recommendation   04/05/2022   3:54 PM Clinical Impressions Clinical Impression Pt presents with a mild-moderate oropharyngeal dysphagia c/b lingual discoordination, delayed swallow initiation, incomplete laryngeal  closure, seemingely reduced UES opening, and diminished sensation. Pt also with very poor positioning which appears to impact swallow function.  SLP had assist pt to hold his head up for PO trials during MBS. These deficits resulted in silent aspiration of thin liquid before the swallow.  Cup  presentation was not reliably beneficial to prevent aspiration.  Pt was unable to clear aspiration and did not follow instructions for cued cough.  With nectar thick liquid there was premature spillage to the pyriform sinuses but no penetration or aspiration with both cup and straw presentation.  Pt was unable to siphon honey thick liquid by straw.  There was no penetration of hone thick liquid by cup.  There was prolonged oral phase with reduced bolus cohesion.  Pt did not initiate swallow until bolus had fully transited from oral cavity with stasis of portions of bolus in pharyngeal stasis rather than use of piecemeal degultition.  With puree and regular solid texture there was no penetration or aspiration.  With solid texture in particular there was increased retention of contrast at UES, but no backflow to pharyx.  There was diffuse pharyngeal residue with all consistencies.  Pt is not a good candidate for swallowing therapy 2/2 inability to follow directions consistently. Recommend mehcanical soft diet with nectar thick liquids. SLP Visit Diagnosis Dysphagia, oropharyngeal phase (R13.12) Impact on safety and function Moderate aspiration risk     04/05/2022   3:54 PM Treatment Recommendations Treatment Recommendations Therapy as outlined in treatment plan below     04/05/2022   4:02 PM Prognosis Prognosis for Safe Diet Advancement Fair   04/05/2022   3:54 PM Diet Recommendations SLP Diet Recommendations Dysphagia 3 (Mech soft) solids;Nectar thick liquid Liquid Administration via Cup;Straw Medication Administration Crushed with puree Compensations Slow rate;Small sips/bites;assist with positioning as needed Postural Changes Seated upright at 90 degrees     04/05/2022   3:54 PM Other Recommendations Oral Care Recommendations Oral care BID Other Recommendations Order thickener from pharmacy Follow Up Recommendations Skilled nursing-short term rehab (<3 hours/day) Functional Status Assessment Patient has had a recent  decline in their functional status and demonstrates the ability to make significant improvements in function in a reasonable and predictable amount of time.   04/05/2022   3:54 PM Frequency and Duration  Speech Therapy Frequency (ACUTE ONLY) min 2x/week Treatment Duration 2 weeks     04/05/2022   3:44 PM Oral Phase Oral Phase Impaired Oral - Honey Cup Decreased bolus cohesion;Delayed oral transit;Lingual pumping Oral - Nectar Cup Premature spillage;Decreased bolus cohesion Oral - Nectar Straw Premature spillage;Decreased bolus cohesion Oral - Thin Cup Premature spillage;Decreased bolus cohesion Oral - Thin Straw Premature spillage Oral - Puree Premature spillage;Delayed oral transit Oral - Regular Impaired mastication;Delayed oral transit;Premature spillage;Decreased bolus cohesion    04/05/2022   3:53 PM Pharyngeal Phase Pharyngeal Material does not enter airway Pharyngeal- Nectar Cup Delayed swallow initiation-pyriform sinuses Pharyngeal Material does not enter airway Pharyngeal- Nectar Straw Delayed swallow initiation-pyriform sinuses Pharyngeal Material does not enter airway Pharyngeal- Thin Cup Delayed swallow initiation-pyriform sinuses;Reduced airway/laryngeal closure;Penetration/Aspiration before swallow Pharyngeal Material enters airway, passes BELOW cords without attempt by patient to eject out (silent aspiration) Pharyngeal- Thin Straw Delayed swallow initiation-pyriform sinuses;Reduced airway/laryngeal closure;Penetration/Aspiration before swallow Pharyngeal Material enters airway, passes BELOW cords without attempt by patient to eject out (silent aspiration) Pharyngeal- Puree Delayed swallow initiation-vallecula;Pharyngeal residue - cp segment Pharyngeal Material does not enter airway Pharyngeal- Regular Delayed swallow initiation-vallecula;Pharyngeal residue - cp segment Pharyngeal Material does not enter airway  No data to display    Celedonio Savage, MA, [place] Office:  [phone] 04/05/2022, 4:06 PM                     ECHOCARDIOGRAM COMPLETE  Result Date: 04/05/2022    ECHOCARDIOGRAM REPORT   Patient Name:   Tom Ortiz Date of Exam: 04/05/2022 Medical Rec #:  [id number]        Height:       70.0 in Accession #:    [id number]       Weight:       147.9 lb Date of Birth:  1927/10/28         BSA:          1.836 m Patient Age:    56 years         BP:           101/52 mmHg Patient Gender: M                HR:           70 bpm. Exam Location:  Inpatient Procedure: 2D Echo Indications:    atrial fibrillation  History:        Patient has prior history of Echocardiogram examinations, most                 recent 06/30/2020. CHF; Risk Factors:Hypertension and                 Dyslipidemia.  Sonographer:    Harvie Junior Referring Phys: [phone] DEBBY CROSLEY  Sonographer Comments: Technically difficult study due to poor echo windows and no subcostal window. Image acquisition challenging due to uncooperative patient and Image acquisition challenging due to respiratory motion. IMPRESSIONS  1. Technically difficult study. Left ventricular ejection fraction, by estimation, is 50 to 55%. The left ventricle has low normal function. Left ventricular endocardial border not optimally defined to evaluate regional wall motion. There is mild left ventricular hypertrophy. Left ventricular diastolic parameters are indeterminate.  2. Right ventricular systolic function is normal. The right ventricular size is normal.  3. The mitral valve is grossly normal. Trivial mitral valve regurgitation. No evidence of mitral stenosis.  4. The aortic valve was not well visualized. Aortic valve regurgitation is trivial. Mild aortic valve stenosis. FINDINGS  Left Ventricle: Left ventricular ejection fraction, by estimation, is 50 to 55%. The left ventricle has low normal function. Left ventricular endocardial border not optimally defined to evaluate regional wall motion. The left ventricular internal cavity  size was  small. There is mild left ventricular hypertrophy. Left ventricular diastolic parameters are indeterminate. Right Ventricle: The right ventricular size is normal. Right vetricular wall thickness was not well visualized. Right ventricular systolic function is normal. The tricuspid regurgitant velocity is 2.15 m/s, and with an assumed right atrial pressure of 3 mmHg, the estimated right ventricular systolic pressure is 79.8 mmHg. Left Atrium: Left atrial size was normal in size. Right Atrium: Right atrial size was normal in size. Pericardium: Trivial pericardial effusion is present. Presence of epicardial fat layer. Mitral Valve: The mitral valve is grossly normal. Trivial mitral valve regurgitation. No evidence of mitral valve stenosis. Tricuspid Valve: The tricuspid valve is not well visualized. Tricuspid valve regurgitation is not demonstrated. Aortic Valve: The aortic valve was not well visualized. Aortic valve regurgitation is trivial. Aortic regurgitation PHT measures 296 msec. Mild aortic stenosis is present. Aortic valve mean gradient measures 7.4 mmHg. Aortic valve peak gradient measures  14.5 mmHg. Aortic valve area, by VTI measures 1.39 cm. Pulmonic Valve: The pulmonic valve was not well visualized. Pulmonic valve regurgitation is not visualized. Aorta: The aortic root is normal in size and structure. IAS/Shunts: The interatrial septum was not well visualized.  LEFT VENTRICLE PLAX 2D LVIDd:         3.20 cm     Diastology LVIDs:         2.00 cm     LV e' medial:    6.31 cm/s LV PW:         1.10 cm     LV E/e' medial:  13.1 LV IVS:        1.20 cm     LV e' lateral:   7.40 cm/s LVOT diam:     2.10 cm     LV E/e' lateral: 11.2 LV SV:         50 LV SV Index:   27 LVOT Area:     3.46 cm                             3D Volume EF: LV Volumes (MOD)           3D EF:        46 % LV vol d, MOD A2C: 82.7 ml LV EDV:       109 ml LV vol d, MOD A4C: 68.6 ml LV ESV:       59 ml LV vol s, MOD A2C: 36.9 ml LV SV:        50 ml  LV vol s, MOD A4C: 31.3 ml LV SV MOD A2C:     45.8 ml LV SV MOD A4C:     68.6 ml LV SV MOD BP:      41.0 ml RIGHT VENTRICLE RV Basal diam:  3.20 cm RV Mid diam:    3.10 cm RV S prime:     13.37 cm/s TAPSE (M-mode): 1.7 cm LEFT ATRIUM             Index        RIGHT ATRIUM           Index LA Vol (A2C):   48.7 ml 26.52 ml/m  RA Area:     11.90 cm LA Vol (A4C):   48.9 ml 26.63 ml/m  RA Volume:   22.40 ml  12.20 ml/m LA Biplane Vol: 49.7 ml 27.07 ml/m  AORTIC VALVE                     PULMONIC VALVE AV Area (Vmax):    1.40 cm      PV Vmax:       0.92 m/s AV Area (Vmean):   1.43 cm      PV Peak grad:  3.4 mmHg AV Area (VTI):     1.39 cm AV Vmax:           190.40 cm/s AV Vmean:          128.000 cm/s AV VTI:            0.360 m AV Peak Grad:      14.5 mmHg AV Mean Grad:      7.4 mmHg LVOT Vmax:         77.20 cm/s LVOT Vmean:        52.750 cm/s LVOT VTI:          0.145 m LVOT/AV VTI  ratio: 0.40 AI PHT:            296 msec  AORTA Ao Root diam: 3.80 cm MITRAL VALVE               TRICUSPID VALVE MV Area (PHT): 3.42 cm    TR Peak grad:   18.5 mmHg MV Decel Time: 222 msec    TR Vmax:        215.00 cm/s MR Peak grad: 27.0 mmHg MR Vmax:      260.00 cm/s  SHUNTS MV E velocity: 82.70 cm/s  Systemic VTI:  0.14 m MV A velocity: 58.50 cm/s  Systemic Diam: 2.10 cm MV E/A ratio:  1.41 Oswaldo Milian MD Electronically signed by Oswaldo Milian MD Signature Date/Time: 04/05/2022/2:16:29 PM    Final    VAS [place] LOWER EXTREMITY VENOUS (DVT)  Result Date: 04/05/2022  Lower Venous DVT Study Patient Name:  Tom Ortiz [facility]  Date of Exam:   04/05/2022 Medical Rec #: [id number]         Accession #:    [id number] Date of Birth: 1927-08-12          Patient Gender: M Patient Age:   3 years Exam Location:  [facility] Procedure:      VAS [place] LOWER EXTREMITY VENOUS (DVT) Referring Phys: DEBBY CROSLEY --------------------------------------------------------------------------------  Indications: Pulmonary embolism.  Comparison  Study: no prior Performing Technologist: Archie Patten RVS  Examination Guidelines: A complete evaluation includes B-mode imaging, spectral Doppler, color Doppler, and power Doppler as needed of all accessible portions of each vessel. Bilateral testing is considered an integral part of a complete examination. Limited examinations for reoccurring indications may be performed as noted. The reflux portion of the exam is performed with the patient in reverse Trendelenburg.  +---------+---------------+---------+-----------+----------+--------------+ RIGHT    CompressibilityPhasicitySpontaneityPropertiesThrombus Aging +---------+---------------+---------+-----------+----------+--------------+ CFV      Full           Yes      Yes                                 +---------+---------------+---------+-----------+----------+--------------+ SFJ      Full                                                        +---------+---------------+---------+-----------+----------+--------------+ FV Prox  Full                                                        +---------+---------------+---------+-----------+----------+--------------+ FV Mid   Full                                                        +---------+---------------+---------+-----------+----------+--------------+ FV DistalFull           Yes      Yes                                 +---------+---------------+---------+-----------+----------+--------------+  PFV      Full                                                        +---------+---------------+---------+-----------+----------+--------------+ POP      Full           Yes      Yes                                 +---------+---------------+---------+-----------+----------+--------------+ PTV      Full           Yes      Yes                                 +---------+---------------+---------+-----------+----------+--------------+ PERO     Full            Yes      Yes                                 +---------+---------------+---------+-----------+----------+--------------+   +---------+---------------+---------+-----------+----------+--------------+ LEFT     CompressibilityPhasicitySpontaneityPropertiesThrombus Aging +---------+---------------+---------+-----------+----------+--------------+ CFV      Full           Yes      Yes                                 +---------+---------------+---------+-----------+----------+--------------+ SFJ      Full                                                        +---------+---------------+---------+-----------+----------+--------------+ FV Prox  Full                                                        +---------+---------------+---------+-----------+----------+--------------+ FV Mid   Full                                                        +---------+---------------+---------+-----------+----------+--------------+ FV DistalFull                                                        +---------+---------------+---------+-----------+----------+--------------+ PFV      Full                                                        +---------+---------------+---------+-----------+----------+--------------+  POP      Full           Yes      Yes                                 +---------+---------------+---------+-----------+----------+--------------+ PTV      Full                                                        +---------+---------------+---------+-----------+----------+--------------+ PERO     Full                                                        +---------+---------------+---------+-----------+----------+--------------+     Summary: BILATERAL: - No evidence of deep vein thrombosis seen in the lower extremities, bilaterally. -No evidence of popliteal cyst, bilaterally.   *See table(s) above for measurements and observations. Electronically  signed by Harold Barban MD on 04/05/2022 at 2:13:44 PM.    Final    CT Angio Chest PE W and/or Wo Contrast  Addendum Date: 04/04/2022   ADDENDUM REPORT: 04/04/2022 22:44 ADDENDUM: Critical Value/emergent results were called by telephone at the time of interpretation on 04/04/2022 at 10:44 pm to provider DAVID YAO , who verbally acknowledged these results. Electronically Signed   By: Brett Fairy M.D.   On: 04/04/2022 22:44   Result Date: 04/04/2022 CLINICAL DATA:  Pulmonary embolism suspected, high probability. Hypotension, stroke suspected. EXAM: CT ANGIOGRAPHY CHEST WITH CONTRAST TECHNIQUE: Multidetector CT imaging of the chest was performed using the standard protocol during bolus administration of intravenous contrast. Multiplanar CT image reconstructions and MIPs were obtained to evaluate the vascular anatomy. RADIATION DOSE REDUCTION: This exam was performed according to the departmental dose-optimization program which includes automated exposure control, adjustment of the mA and/or kV according to patient size and/or use of iterative reconstruction technique. CONTRAST:  55m OMNIPAQUE IOHEXOL 350 MG/ML SOLN COMPARISON:  None Available. FINDINGS: Cardiovascular: The heart is mildly enlarged and there is no pericardial effusion. Multi-vessel coronary artery calcifications are seen. There is atherosclerotic calcification of the aorta without evidence of aneurysm. The pulmonary trunk is normal in caliber. Small lobar, segmental, and subsegmental pulmonary artery filling defects in the left lower lobe and segmental and subsegmental pulmonary emboli in the right lower lobe. No evidence of right heart strain. Mediastinum/Nodes: No mediastinal, hilar, or axillary lymphadenopathy. The thyroid gland, trachea, and esophagus are within normal limits. Lungs/Pleura: There is a trace left pleural effusion. Bronchial wall thickening is noted bilaterally with patchy atelectasis or infiltrate at the lung bases. No  pneumothorax Upper Abdomen: Stones are present within the gallbladder. There is a cyst in the right kidney. No acute abnormality. Musculoskeletal: There are compression deformities at T6, T8, T11, and T12, indeterminate in age. Degenerative changes are present in the thoracic spine. Review of the MIP images confirms the above findings. IMPRESSION: 1. Bilateral lower lobe pulmonary emboli. No evidence of right heart strain. 2. Patchy atelectasis or infiltrate at the lung bases. 3. Trace left pleural effusion. 4. Cholelithiasis. 5. Multilevel compression deformities in the thoracic spine, indeterminate in age. 6. Coronary artery calcifications.  7. Aortic atherosclerosis. Electronically Signed: By: Brett Fairy M.D. On: 04/04/2022 22:41   CT HEAD WO CONTRAST (5MM)  Result Date: 04/04/2022 CLINICAL DATA:  Acute neurologic deficit EXAM: CT HEAD WITHOUT CONTRAST TECHNIQUE: Contiguous axial images were obtained from the base of the skull through the vertex without intravenous contrast. RADIATION DOSE REDUCTION: This exam was performed according to the departmental dose-optimization program which includes automated exposure control, adjustment of the mA and/or kV according to patient size and/or use of iterative reconstruction technique. COMPARISON:  None Available. FINDINGS: Brain: There is no mass, hemorrhage or extra-axial collection. There is generalized atrophy without lobar predilection. Hypodensity of the white matter is most commonly associated with chronic microvascular disease. Vascular: Atherosclerotic calcification of the vertebral and internal carotid arteries at the skull base. No abnormal hyperdensity of the major intracranial arteries or dural venous sinuses. Skull: The visualized skull base, calvarium and extracranial soft tissues are normal. Sinuses/Orbits: No fluid levels or advanced mucosal thickening of the visualized paranasal sinuses. No mastoid or middle ear effusion. The orbits are normal.  IMPRESSION: 1. No acute intracranial abnormality. 2. Generalized atrophy and findings of chronic microvascular disease. Electronically Signed   By: Ulyses Jarred M.D.   On: 04/04/2022 22:36   DG Chest Port 1 View  Result Date: 04/04/2022 CLINICAL DATA:  Possible sepsis.  Weakness for 2 weeks. EXAM: PORTABLE CHEST 1 VIEW COMPARISON:  07/11/2016. FINDINGS: The heart size and mediastinal contours are stable. There is atherosclerotic calcification of the aorta. Lung volumes are low. There is patchy airspace disease at the left lung base. No effusion or pneumothorax. A stable compression deformity is present in the mid thoracic spine. No acute osseous abnormality. IMPRESSION: Patchy atelectasis or infiltrate at the left lung base. Electronically Signed   By: Brett Fairy M.D.   On: 04/04/2022 20:08       LOS: 2 days   [facility] Pager on [email or web address]  04/06/2022, 9:18 AM

## 2022-04-06 NOTE — Evaluation (Signed)
Physical Therapy Evaluation Patient Details Name: Tom Ortiz MRN: [id number] DOB: [birth year] Today's Date: 04/06/2022  History of Present Illness  89 yo male presents to [facility] on 1/20 with weakness, hypotension, afib RVR. CTA shows PE, heparin started 1/20. PMH includes dementia, hearing impairment, PAF, HTN, OA, bilat THA.  Clinical Impression   Pt presents with generalized weakness, impaired balance, and decreased activity tolerance. Pt to benefit from acute PT to address deficits. Pt sat EOB only this session with max assist for to/from EOB, pt limited in further activity by fatigue and back pain. Pt also with vtach event at end of session, see below. PT to progress mobility as tolerated, and will continue to follow acutely.      Pt with vtach event with HR up to 198 bpm upon return to supine and after repositioning. RN called to room immediately to address, HR 130s bpm upon PT exit   Recommendations for follow up therapy are one component of a multi-disciplinary discharge planning process, led by the attending physician.  Recommendations may be updated based on patient status, additional functional criteria and insurance authorization.  Follow Up Recommendations Skilled nursing-short term rehab (<3 hours/day) Can patient physically be transported by private vehicle: No    Assistance Recommended at Discharge Frequent or constant Supervision/Assistance  Patient can return home with the following  Two people to help with walking and/or transfers;Two people to help with bathing/dressing/bathroom    Equipment Recommendations None recommended by PT  Recommendations for Other Services       Functional Status Assessment Patient has had a recent decline in their functional status and/or demonstrates limited ability to make significant improvements in function in a reasonable and predictable amount of time     Precautions / Restrictions Precautions Precautions: Fall Precaution Comments:  watch HR Restrictions Weight Bearing Restrictions: No      Mobility  Bed Mobility Overal bed mobility: Needs Assistance Bed Mobility: Supine to Sit, Sit to Supine     Supine to sit: Max assist Sit to supine: Max assist   General bed mobility comments: assist for all aspects to and from EOB, boost up assist    Transfers                        Ambulation/Gait                  Stairs            Wheelchair Mobility    Modified Rankin (Stroke Patients Only)       Balance Overall balance assessment: Needs assistance Sitting-balance support: Feet supported, Bilateral upper extremity supported Sitting balance-Leahy Scale: Poor   Postural control: Posterior lean, Right lateral lean     Standing balance comment: nt                             Pertinent Vitals/Pain Pain Assessment Pain Assessment: Faces Faces Pain Scale: Hurts little more Pain Location: back, neck Pain Descriptors / Indicators: Sore Pain Intervention(s): Limited activity within patient's tolerance, Monitored during session, Repositioned    Home Living Family/patient expects to be discharged to:: Private residence Living Arrangements: Spouse/significant other Available Help at Discharge: Family Type of Home: House Home Access: Stairs to enter Entrance Stairs-Rails: Right Entrance Stairs-Number of Steps: 2   Home Layout: One level Home Equipment: Conservation officer, nature (2 wheels);Cane - single point;Toilet riser      Prior Function Prior  Level of Function : Needs assist             Mobility Comments: pt uses RW PTA ADLs Comments: PEr pt's daughter, pt's wife assists him as needed     Hand Dominance   Dominant Hand: Right    Extremity/Trunk Assessment   Upper Extremity Assessment Upper Extremity Assessment: Defer to OT evaluation    Lower Extremity Assessment Lower Extremity Assessment: Generalized weakness (foot drop appearance to bilat feet, able to  actively DF when cued)    Cervical / Trunk Assessment Cervical / Trunk Assessment: Kyphotic  Communication   Communication: HOH  Cognition Arousal/Alertness: Awake/alert Behavior During Therapy: WFL for tasks assessed/performed Overall Cognitive Status: History of cognitive impairments - at baseline                                 General Comments: history of dementia, questionable historian and oriented to self only.        General Comments General comments (skin integrity, edema, etc.): Pt with vtach event with HR up to 198 bpm upon return to supine and after repositioning; pt initially gasped and clutched chest and not responding to PT questions. RNs called to room to address, pt conversive and complaining of feeling his heart racing upon PT exit.    Exercises     Assessment/Plan    PT Assessment Patient needs continued PT services  PT Problem List Decreased strength;Decreased activity tolerance;Decreased mobility;Decreased safety awareness;Decreased cognition;Cardiopulmonary status limiting activity;Decreased skin integrity;Pain;Decreased balance       PT Treatment Interventions DME instruction;Functional mobility training;Gait training;Therapeutic activities;Balance training;Patient/family education;Therapeutic exercise    PT Goals (Current goals can be found in the Care Plan section)  Acute Rehab PT Goals PT Goal Formulation: With patient/family Time For Goal Achievement: 04/20/22 Potential to Achieve Goals: Fair    Frequency Min 2X/week     Co-evaluation               AM-PAC PT "6 Clicks" Mobility  Outcome Measure Help needed turning from your back to your side while in a flat bed without using bedrails?: A Lot Help needed moving from lying on your back to sitting on the side of a flat bed without using bedrails?: Total Help needed moving to and from a bed to a chair (including a wheelchair)?: Total Help needed standing up from a chair using  your arms (e.g., wheelchair or bedside chair)?: Total Help needed to walk in hospital room?: Total Help needed climbing 3-5 steps with a railing? : Total 6 Click Score: 7    End of Session Equipment Utilized During Treatment:  (pt off O2 upon PT arrival to room, sPO2 98% and greater) Activity Tolerance: Treatment limited secondary to medical complications (Comment) Patient left: in bed;with call bell/phone within reach;with nursing/sitter in room;Other (comment);with bed alarm set;with family/visitor present Nurse Communication: Mobility status PT Visit Diagnosis: Muscle weakness (generalized) (M62.81)    Time: 8546-2703 PT Time Calculation (min) (ACUTE ONLY): 35 min   Charges:   PT Evaluation $PT Eval Low Complexity: 1 Low PT Treatments $Therapeutic Activity: 8-22 mins        Stacie Glaze, PT DPT Acute Rehabilitation Services Pager [phone]  Office [phone]   Roxine Caddy E Ruffin Pyo 04/06/2022, 7:09 PM

## 2022-04-06 NOTE — Progress Notes (Signed)
   Called by RN when pt was up to ambulate he grabbed chest and HR up to 190.  Placed back in bed and IV lopressor to be given.  Did not resume imdur due to soft BP.    Cecilie Kicks, FNP-C [facility]  HFG:[phone] or after 5pm and on weekends call [phone] 04/06/2022

## 2022-04-07 ENCOUNTER — Inpatient Hospital Stay (HOSPITAL_COMMUNITY): Payer: Medicare Other

## 2022-04-07 DIAGNOSIS — F0390 Unspecified dementia without behavioral disturbance: Secondary | ICD-10-CM

## 2022-04-07 DIAGNOSIS — D62 Acute posthemorrhagic anemia: Secondary | ICD-10-CM

## 2022-04-07 DIAGNOSIS — E43 Unspecified severe protein-calorie malnutrition: Secondary | ICD-10-CM | POA: Insufficient documentation

## 2022-04-07 DIAGNOSIS — N179 Acute kidney failure, unspecified: Secondary | ICD-10-CM | POA: Diagnosis not present

## 2022-04-07 DIAGNOSIS — Z7189 Other specified counseling: Secondary | ICD-10-CM

## 2022-04-07 DIAGNOSIS — R319 Hematuria, unspecified: Secondary | ICD-10-CM | POA: Diagnosis not present

## 2022-04-07 DIAGNOSIS — Z515 Encounter for palliative care: Secondary | ICD-10-CM

## 2022-04-07 DIAGNOSIS — I2699 Other pulmonary embolism without acute cor pulmonale: Secondary | ICD-10-CM | POA: Diagnosis not present

## 2022-04-07 DIAGNOSIS — I482 Chronic atrial fibrillation, unspecified: Secondary | ICD-10-CM | POA: Diagnosis not present

## 2022-04-07 LAB — CBC
HCT: 21.8 % — ABNORMAL LOW (ref 39.0–52.0)
HCT: 21.7 % — ABNORMAL LOW (ref 39.0–52.0)
Hemoglobin: 7.3 g/dL — ABNORMAL LOW (ref 13.0–17.0)
Hemoglobin: 6.9 g/dL — CL (ref 13.0–17.0)
MCH: 36.7 pg — ABNORMAL HIGH (ref 26.0–34.0)
MCH: 35.8 pg — ABNORMAL HIGH (ref 26.0–34.0)
MCHC: 33.5 g/dL (ref 30.0–36.0)
MCHC: 31.8 g/dL (ref 30.0–36.0)
MCV: 109.5 fL — ABNORMAL HIGH (ref 80.0–100.0)
MCV: 112.4 fL — ABNORMAL HIGH (ref 80.0–100.0)
Platelets: 178 10*3/uL (ref 150–400)
Platelets: 187 10*3/uL (ref 150–400)
RBC: 1.99 MIL/uL — ABNORMAL LOW (ref 4.22–5.81)
RBC: 1.93 MIL/uL — ABNORMAL LOW (ref 4.22–5.81)
RDW: 15 % (ref 11.5–15.5)
RDW: 15 % (ref 11.5–15.5)
WBC: 5.3 10*3/uL (ref 4.0–10.5)
WBC: 4.8 10*3/uL (ref 4.0–10.5)
nRBC: 0 % (ref 0.0–0.2)
nRBC: 0 % (ref 0.0–0.2)

## 2022-04-07 LAB — RETICULOCYTES
Immature Retic Fract: 15.3 % (ref 2.3–15.9)
RBC.: 1.9 MIL/uL — ABNORMAL LOW (ref 4.22–5.81)
Retic Count, Absolute: 21.7 10*3/uL (ref 19.0–186.0)
Retic Ct Pct: 1.1 % (ref 0.4–3.1)

## 2022-04-07 LAB — IRON AND TIBC
Iron: 62 ug/dL (ref 45–182)
Saturation Ratios: 60 % — ABNORMAL HIGH (ref 17.9–39.5)
TIBC: 104 ug/dL — ABNORMAL LOW (ref 250–450)
UIBC: 42 ug/dL

## 2022-04-07 LAB — HEMOGLOBIN AND HEMATOCRIT, BLOOD
HCT: 22.1 % — ABNORMAL LOW (ref 39.0–52.0)
HCT: 25.2 % — ABNORMAL LOW (ref 39.0–52.0)
Hemoglobin: 7.1 g/dL — ABNORMAL LOW (ref 13.0–17.0)
Hemoglobin: 8.5 g/dL — ABNORMAL LOW (ref 13.0–17.0)

## 2022-04-07 LAB — FERRITIN: Ferritin: 372 ng/mL — ABNORMAL HIGH (ref 24–336)

## 2022-04-07 LAB — HEPARIN LEVEL (UNFRACTIONATED): Heparin Unfractionated: 0.42 IU/mL (ref 0.30–0.70)

## 2022-04-07 LAB — BASIC METABOLIC PANEL
Anion gap: 5 (ref 5–15)
BUN: 54 mg/dL — ABNORMAL HIGH (ref 8–23)
CO2: 19 mmol/L — ABNORMAL LOW (ref 22–32)
Calcium: 7 mg/dL — ABNORMAL LOW (ref 8.9–10.3)
Chloride: 111 mmol/L (ref 98–111)
Creatinine, Ser: 1.47 mg/dL — ABNORMAL HIGH (ref 0.61–1.24)
GFR, Estimated: 44 mL/min — ABNORMAL LOW (ref >60)
Glucose, Bld: 99 mg/dL (ref 70–99)
Potassium: 3.9 mmol/L (ref 3.5–5.1)
Sodium: 135 mmol/L (ref 135–145)

## 2022-04-07 LAB — PREPARE RBC (CROSSMATCH)

## 2022-04-07 MED ORDER — PROTAMINE SULFATE 10 MG/ML IV SOLN
25.0000 mg | INTRAVENOUS | Status: AC
  Administered 2022-04-07: 25 mg via INTRAVENOUS
  Filled 2022-04-07 (×2): qty 2.5

## 2022-04-07 MED ORDER — METOPROLOL TARTRATE 25 MG PO TABS
25.0000 mg | ORAL_TABLET | Freq: Two times a day (BID) | ORAL | Status: DC
  Administered 2022-04-07: 25 mg via ORAL
  Filled 2022-04-07: qty 1

## 2022-04-07 MED ORDER — SODIUM CHLORIDE 0.9 % IV BOLUS
500.0000 mL | Freq: Once | INTRAVENOUS | Status: AC
  Administered 2022-04-07: 500 mL via INTRAVENOUS

## 2022-04-07 MED ORDER — SODIUM CHLORIDE 0.9 % IV BOLUS: 500.0000 mL | Freq: Once | INTRAVENOUS | Status: DC

## 2022-04-07 MED ORDER — IOHEXOL 9 MG/ML PO SOLN
500.0000 mL | ORAL | Status: AC
  Administered 2022-04-07: 500 mL via ORAL

## 2022-04-07 MED ORDER — IOHEXOL 350 MG/ML SOLN
60.0000 mL | Freq: Once | INTRAVENOUS | Status: AC | PRN
  Administered 2022-04-07: 60 mL via INTRAVENOUS

## 2022-04-07 MED ORDER — ADULT MULTIVITAMIN W/MINERALS CH
1.0000 | ORAL_TABLET | Freq: Every day | ORAL | Status: DC
  Administered 2022-04-07 – 2022-04-09 (×3): 1 via ORAL
  Filled 2022-04-07 (×3): qty 1

## 2022-04-07 MED ORDER — LORAZEPAM 0.5 MG PO TABS: 0.5000 mg | ORAL_TABLET | Freq: Every evening | ORAL | Status: DC | PRN

## 2022-04-07 MED ORDER — METOPROLOL TARTRATE 12.5 MG HALF TABLET
12.5000 mg | ORAL_TABLET | Freq: Two times a day (BID) | ORAL | Status: DC
  Administered 2022-04-08 – 2022-04-14 (×11): 12.5 mg via ORAL
  Filled 2022-04-07 (×11): qty 1

## 2022-04-07 MED ORDER — SODIUM CHLORIDE 0.9% IV SOLUTION: Freq: Once | INTRAVENOUS | Status: AC

## 2022-04-07 NOTE — Progress Notes (Signed)
Initial Nutrition Assessment  DOCUMENTATION CODES:  Severe malnutrition in context of chronic illness  INTERVENTION:  Meal feeding with assistance Mighty Shake TID with meals, each supplement provides 330 kcals and 9 grams of protein MVI with minerals daily Request updated weight  NUTRITION DIAGNOSIS:  Severe Malnutrition related to chronic illness (dementia, HF) as evidenced by severe fat depletion, severe muscle depletion, moderate muscle depletion.  GOAL:  Patient will meet greater than or equal to 90% of their needs  MONITOR:  PO intake, Supplement acceptance, Labs, Weight trends  REASON FOR ASSESSMENT:  Malnutrition Screening Tool    ASSESSMENT:  Pt presented with progressive confusion, SOB and nonproductive cough. Admitted with pulmonary embolus and chronic afib with RVR. PMH significant for dementia, afib, HFrEF, HTN  Pending PMT consult. Oriented x2.   01/21: s/p MBS-   Pt presents with a mild-moderate oropharyngeal dysphagia c/b lingual discoordination, delayed swallow initiation, incomplete laryngeal closure, seemingely reduced UES opening, and diminished sensation ; recommend dysphagia 3/nectar thick liquids  Meal completions: 1/22: 0% dinner  Pt sleeping at time of visit. No family present at bedside. He briefly opened his eyes to RD speaking his name however he went back to sleep. No documented meal completions on file for patient today. Hormel shake on bedside table with maybe 1 or 2 sips consumed. Ensure also on bedside table unopened.   Admit weight is a stated weight. Uncertain of actual weight. There no documented history on file within the last year. Will request updated weight and monitor for any significant changes throughout admission.   Medications: vitamin b12  Labs: BUN 54, Cr 1.47, Ca 7.0, GFR 44  NUTRITION - FOCUSED PHYSICAL EXAM: Flowsheet Row Most Recent Value  Orbital Region Severe depletion  Upper Arm Region Severe depletion  Thoracic and  Lumbar Region Moderate depletion  Buccal Region Severe depletion  Temple Region Severe depletion  Clavicle Bone Region Moderate depletion  Clavicle and Acromion Bone Region Severe depletion  Scapular Bone Region Moderate depletion  Dorsal Hand Unable to assess  [in mits]  Patellar Region Moderate depletion  Anterior Thigh Region Moderate depletion  Posterior Calf Region Mild depletion  Edema (RD Assessment) None  Hair Reviewed  Eyes Unable to assess  Mouth Unable to assess  Skin Reviewed  Nails Unable to assess       Diet Order:   Diet Order             DIET DYS 3 Room service appropriate? Yes; Fluid consistency: Nectar Thick  Diet effective now                   EDUCATION NEEDS:  No education needs have been identified at this time  Skin:  Skin Assessment: Reviewed RN Assessment  Last BM:  unknown  Height:  Ht Readings from Last 1 Encounters:  04/04/22 '5\' 10"'$  (1.778 m)   Weight:  Wt Readings from Last 1 Encounters:  04/04/22 67.1 kg   BMI:  Body mass index is 21.22 kg/m.  Estimated Nutritional Needs:   Kcal:  1700-1900  Protein:  85-100g  Fluid:  >/=1.7L  Clayborne Dana, RDN, LDN Clinical Nutrition

## 2022-04-07 NOTE — TOC Initial Note (Signed)
Transition of Care [facility]) - Initial/Assessment Note    Patient Details  Name: Tom Ortiz MRN: [id number] Date of Birth: 1927-12-19  Transition of Care [facility]) CM/SW Contact:    Vinie Sill, LCSW Phone Number: 04/07/2022, 6:14 PM  Clinical Narrative:                  CSW spoke with patient's son,Tom Ortiz. CSW introduced self and explained role. He confirmed he is the caregiver for patient and the patient's spouse. Patient does not live with him. Patient  is in the home with his spouse. He states the patient spouse is unable to care for the patient. He informed CSW, family is planning to meet with palliative care. He is not sure if the patient will d/c to SNF. He states family is considering comfort care but  the decision will be made after further discussion with family and PC for [place]. He was agreeable to sending SNF referrals as family determines their plan.   TOC will continue to follow and assist with discharge planning.  Thurmond Butts, MSW, LCSW Clinical Social Worker      Expected Discharge Plan:  (undetermined at this time) Barriers to Discharge: Continued Medical Work up   Patient Goals and CMS Choice            Expected Discharge Plan and Services In-house Referral: Clinical Social Work                                            Prior Living Arrangements/Services   Lives with:: Self, Spouse Patient language and need for interpreter reviewed:: No        Need for Family Participation in Patient Care: Yes (Comment) Care giver support system in place?: Yes (comment)   Criminal Activity/Legal Involvement Pertinent to Current Situation/Hospitalization: No - Comment as needed  Activities of Daily Living   ADL Screening (condition at time of admission) Patient's cognitive ability adequate to safely complete daily activities?: Yes Is the patient deaf or have difficulty hearing?: Yes Does the patient have difficulty seeing, even when wearing  glasses/contacts?: No Does the patient have difficulty concentrating, remembering, or making decisions?: Yes Patient able to express need for assistance with ADLs?: Yes Does the patient have difficulty dressing or bathing?: Yes Independently performs ADLs?: No Communication: Independent Dressing (OT): Dependent Is this a change from baseline?: Pre-admission baseline Grooming: Dependent Is this a change from baseline?: Pre-admission baseline Feeding: Dependent Is this a change from baseline?: Pre-admission baseline Bathing: Dependent Is this a change from baseline?: Pre-admission baseline In/Out Bed: Dependent Is this a change from baseline?: Pre-admission baseline Does the patient have difficulty walking or climbing stairs?: Yes Weakness of Legs: Both Weakness of Arms/Hands: Both  Permission Sought/Granted                  Emotional Assessment       Orientation: : Oriented to Self, Oriented to Place Alcohol / Substance Use: Not Applicable Psych Involvement: No (comment)  Admission diagnosis:  Confusion [R41.0] Elevated troponin [R79.89] Acute pulmonary embolism (HCC) [I26.99] AKI (acute kidney injury) ([place]) [N17.9] Other acute pulmonary embolism without acute cor pulmonale ([place]) [I26.99] Patient Active Problem List   Diagnosis Date Noted   Protein-calorie malnutrition, severe 04/07/2022   Chronic atrial fibrillation with RVR ([place]) 04/04/2022   Acute pulmonary embolism ([place]) 04/04/2022   Acute respiratory failure with hypoxia (  [place]) 04/04/2022   Lactic acid acidosis 04/04/2022   AKI (acute kidney injury) ([place]) 04/04/2022   Elevated troponin 32/91/9166   Acute metabolic encephalopathy 06/00/4599   CAD (coronary artery disease) 04/04/2022   Hyperlipidemia 07/02/2020   Anemia 07/02/2020   ACS (acute coronary syndrome) ([place]) 06/29/2020   Closed intertrochanteric fracture of left hip, with delayed healing, subsequent encounter 07/12/2016   Gait instability 07/12/2016    Hip fracture ([place]) 07/11/2016   Paroxysmal atrial fibrillation ([place]) 04/28/2014   Irregular heart rhythm 04/16/2014   Nausea 04/16/2014   Anxiety 04/16/2014   HTN (hypertension) 04/16/2014   Postop Acute blood loss anemia 01/29/2012   Postop Hyponatremia 10/05/2011   Failed total hip arthroplasty ([place]) 10/02/2011   PCP:  Pcp, No Pharmacy:   [facility], [place] - [address] [place] [address] [place] [address] [place] [address] Phone: [phone] Fax: [phone]  [facility] [address] [place] [address] Phone: [phone] Fax: [phone]     Social Determinants of Health (SDOH) Social History: SDOH Screenings   Tobacco Use: Medium Risk (04/05/2022)   SDOH Interventions:     Readmission Risk Interventions     No data to display

## 2022-04-07 NOTE — Progress Notes (Signed)
Pharmacy note: protamine  Patient on heparin for PE and he pulled his foley with bleeding noted then stopped. Bleeding re-occurred and pharmacy asked to dose protamine -heparin was discontinued ~ 7am and rate was 1150 units/hr -protamine dose is '1mg'$  for every 100 units heparin using the last 2-3 hours on a heparin infusion. Will neutralize a total of 2300 units  Plan -protamine '25mg'$  IV x1 -Will follow anticoagulation plans  Hildred Laser, PharmD Clinical Pharmacist **Pharmacist phone directory can now be found on [email or web address] ([email or web address]).  Listed under [place].

## 2022-04-07 NOTE — Significant Event (Addendum)
Called by RN:  Pt pulled foley out earlier in shift.  Had bleeding initially which had stopped.  But now bleeding has reoccured:  Heparin drip stopped. Ordering protamine to reverse heparin Bleeding improved after condom cath applied per RN. Ordering H/H and Type and screen. Will let day team know.  D/w Dr. Bonnielee Haff. Will defer decision on wether pt needs urology consult or not to Dr. Maryland Pink.

## 2022-04-07 NOTE — Consult Note (Signed)
Palliative Care Consult Note                                  Date: 04/07/2022   Patient Name: Tom Ortiz  DOB: 1927-12-09  MRN: [id number]  Age / Sex: 89 y.o., male  PCP: Pcp, No Referring Physician: Bonnielee Haff, MD  Reason for Consultation: Establishing goals of care  HPI/Patient Profile: 89 y.o. male  with past medical history of dementia, hearing impairment, and paroxysmal atrial fibrillation not on anticoagulation who presented to [facility] ED on 04/04/2022 with weakness for about 2 weeks.  He was noted to be hypotensive and tachycardic when EMS evaluated him.  In the ED, he was found to be in atrial fibrillation with RVR.  He was also found to have a pulmonary embolism and was started on heparin.  Admitted to [facility] service.  Palliative Medicine was consulted for goals of care.  Past Medical History:  Diagnosis Date   Anxiety    Arthritis    Essential hypertension 04/16/2014   Hard of hearing    Hypertension    Paroxysmal atrial fibrillation ([place]) 04/28/2014   Shoulder pain, left 07/14/2016    Subjective:   I have reviewed medical records including progress notes, labs and imaging.  Overnight, patient removed his Foley catheter.  Heparin drip was stopped due to bleeding.  I assessed patient at bedside.  He is calm but remains confused.  He is able to tell me he is in the hospital, but does not know why.  He declines when nurse tech offers him food.  I spoke with son Bentleigh by phone to discuss diagnosis, prognosis, [place], EOL wishes, disposition, and options.  I introduced Palliative Medicine as specialized medical care for people living with serious illness. It focuses on providing relief from the symptoms and stress of a serious illness.   Prior to admission, patient was living at home with his wife, who also has dementia.   We discussed patient's current illness and what it means in the larger context of his ongoing  co-morbidities. Current clinical status was reviewed.  I updated son on the events overnight and that heparin drip has been stopped.    Natural disease trajectory of *** was discussed.  Created space and opportunity for family to express thoughts regarding patient's current medical situation. Values and goals of care were attempted to be elicited.  The difference between full scope medical intervention and comfort care was considered.  I introduced the concept of a comfort path, which means de-escalating full scope medical interventions and allowing a natural course to occur. Discussed that the goal is comfort and dignity rather than cure/prolonging life. Provided information on home vs residential hospice services.   Questions and concerns addressed.   Review of Systems  Unable to perform ROS   Objective:   Primary Diagnoses: Present on Admission:  Chronic atrial fibrillation with RVR ([place])  Acute pulmonary embolism (HCC)  Acute respiratory failure with hypoxia (HCC)  Lactic acid acidosis  AKI (acute kidney injury) ([place])  Elevated troponin  Acute metabolic encephalopathy  Hyperlipidemia  HTN (hypertension)   Physical Exam Vitals reviewed.  Constitutional:      General: He is awake. He is not in acute distress.    Appearance: He is ill-appearing.  Cardiovascular:     Rate and Rhythm: Normal rate.  Pulmonary:     Effort: Pulmonary effort is normal.  Psychiatric:  Cognition and Memory: Cognition is impaired. Memory is impaired.     Vital Signs:  BP 127/68 (BP Location: Left Arm)   Pulse 90   Temp (!) 97.4 F (36.3 C) (Axillary)   Resp 20   Ht '5\' 10"'$  (1.778 m)   Wt 67.1 kg   SpO2 100%   BMI 21.22 kg/m   Palliative Assessment/Data: ***     Assessment & Plan:   SUMMARY OF RECOMMENDATIONS   Continue current supportive interventions with watchful waiting for 48 hours No escalation of care If no improvement, son is leaning toward transition to comfort  care This NP will follow-up on Thursday 1/25  Primary Decision Maker: {Primary Decision KZLDJ:57017}  Code Status/Advance Care Planning: DNR  Symptom Management:  ***  Prognosis:  {Palliative Care Prognosis:23504}  Discharge Planning:  To Be Determined   Discussed with: ***    Thank you for allowing [place] to participate in the care of Tajee Savant Briggs   Time Total: ***  Greater than 50%  of this time was spent counseling and coordinating care related to the above assessment and plan.  Signed by: Elie Confer, NP Palliative Medicine Team  Team Phone # [phone]  For individual providers, please see AMION

## 2022-04-07 NOTE — Progress Notes (Signed)
Attempted 86F foley as ordered by Dr. [place] Pink, patient with frank red blood coming from urethra at this time, unable to pass foley catheter, resistance met. MD. Bedside RN aware. Updated. Deepa Barthel, Bettina Gavia RN

## 2022-04-07 NOTE — Progress Notes (Signed)
[facility] PROGRESS NOTE   Tom Ortiz NTZ:[id number] DOB: 1927-05-25 DOA: 04/04/2022  PCP: Pcp, No  Brief History/Interval Summary: 89 year old Caucasian male with past medical history of dementia, hearing impairment, paroxysmal atrial fibrillation not on anticoagulation who was brought into the emergency department after EMS was called due to history of weakness ongoing for about 2 weeks.  He was noted to be hypotensive and tachycardic when EMS evaluated him.  He was brought into the hospital for further management.  Found to be in rapid atrial fibrillation.  Blood pressure improved after fluid resuscitation.  He was found to have acute pulmonary embolism.  He was hospitalized for further management.  Consultants: Cardiology  Procedures: Echocardiogram    Subjective/Interval History: Overnight events noted.  Patient does not appear to be in any discomfort.  She denies any chest pain or shortness of breath.      Assessment/Plan:  Atrial fibrillation with RVR with a known history of chronic atrial fibrillation Came in with RVR which improved after he hydration.  He was found to have pulmonary embolism which could have been the inciting event. TSH 1.09.   Cardiology was consulted.  Previously not on anticoagulation due to age, anemia, dementia, frail status. Went back into atrial fibrillation with RVR last evening.  Some of the rhythm strip suggests ventricular tachycardia although he could have atrial fibrillation with aberrancy.  He was given metoprolol with a drop in blood pressure and no change in his heart rate.  Subsequently given a bolus dose of amiodarone since patient is already on IV heparin.  Heart rate has improved as of this morning.  Noted to be in sinus rhythm.  Started on beta-blocker by cardiology.  Hematuria He pulled out his Foley catheter overnight.  Was given protamine as he started bleeding.  Will do bladder scans.  Currently has a condom cath.  If  patient starts retaining then may have to place the Foley back in.  Monitor hemoglobin.  Acute pulmonary embolism/acute respiratory failure with hypoxia CT angiogram showed pulmonary embolism. Patient was started on heparin. No DVT noted on lower extremity Doppler studies. Echocardiogram shows LVEF to be 50 to 55%.  Right ventricular function was noted to be normal.  No significant valvular abnormalities noted.  Mild AS was noted. Patient was also empirically started on antibiotics at the time of admission but no infection identified.  Antibiotics were discontinued. Patient has been weaned off of oxygen.  Heparin unfortunately had to be discontinued due to hematuria. Drop in hemoglobin noted which is likely due to hematuria.  No obvious GI bleed has been noted.   Due to his dementia and other comorbidities he is not a candidate for any kind of invasive workup. Palliative care has been consulted to assist with goals of care.  Hopefully his anticoagulation can be resumed soon.  May need to consider IVC filter if we are not able to resume anticoagulation. But considering his dementia and comorbidities he may not be a good candidate for that either.  Will remove wait for palliative care to meet with family as well.  Hypotension, unspecified Could have been due to hypovolemia as he did have acute kidney injury.  Perhaps has had poor oral intake since he has been weak for 2 weeks.  Blood pressure improved with fluid resuscitation Lactic acid levels improved. Continue to monitor for now.  Acute kidney injury As of 2022 is baseline creatinine was around 1.19.   Came in with creatinine of 1.81.  Thought  to be due to hypovolemia.  Patient was given IV fluids.  Creatinine improved to 1.47 today.  Monitor urine output.  Patient pulled out his Foley catheter as discussed above.  Bladder scans.    Unspecified abdominal pain, chronic [name] mentions longstanding abdominal pain.  He is wonders if this  could be the reason for patient's poor appetite. Proceed with CT scan of the abdomen pelvis today.    Chronic systolic CHF EF based on echo from 2022 is 30 to 35%.  Regional wall motion abnormalities and global hypokinesis was noted at that time. Echocardiogram done during this admission showed improved EF at 50 to 55%. Due to his cognitive deficits/dementia not a candidate for any aggressive cardiac interventions. Mildly elevated troponin levels are secondary to PE, tachyarrhythmia and hypotension.  Unlikely to be ACS.  History of dementia with some delirium Continue Namenda.  Seizure disorder Informed that patient was not on keppra.  Keppra was discontinued.    Macrocytic anemia/acute blood loss anemia Baseline hemoglobin seems to be around 10-11.  Came in with hemoglobin of 9.   Drop in hemoglobin partly dilutional partly due to hematuria. Folic acid level 50.5.  Vitamin B12 306.  Will start B12 supplementation. Initially plan was to check stool for occult blood.  However considering all of his comorbidities including dementia and poor baseline quality of life he is not a candidate for any endoscopies.  Will hold off on further testing. Anemia panel to be checked with next blood draw.  History of closed intertrochanteric fracture of the left hip with delayed healing Has seen orthopedics previously.  Last surgery was 10 years ago.  Up until 2 weeks ago patient was ambulatory in the house using a walker.  Goals of care Have discussed with patient's son over the last few days.  Patient's CODE STATUS was changed over to DNR/DNI.  Patient with complicated health issues requiring extensive decision making on the part of patient's son.  Consult to palliative care was introduced to the patient's son.  He wants to meet with palliative medicine team.  Consult placed.   In summary this is a 89 year old male with dementia and other comorbidities who comes in with several acute medical issues.   Patient has had poor quality of life for several months and especially in the last few weeks.  In view of all of this, aggressive care including even anticoagulation is probably not indicated.  Will wait and see how the goals of care conversation go before deciding on further steps.    DVT Prophylaxis: On IV heparin which is currently on hold. Code Status: DNR Family Communication: No family at bedside Disposition Plan: To be determined.  SNF recommended by physical therapy.  Status is: Inpatient Remains inpatient appropriate because: Acute pulmonary embolism, atrial fibrillation with RVR    Medications: Scheduled:  aspirin EC  81 mg Oral Daily   atorvastatin  20 mg Oral Daily   Chlorhexidine Gluconate Cloth  6 each Topical Daily   vitamin B-12  500 mcg Oral Daily   memantine  5 mg Oral BID   metoprolol tartrate  25 mg Oral BID   protamine  25 mg Intravenous STAT   tamsulosin  0.4 mg Oral Daily   Continuous:  sodium chloride 75 mL/hr at 04/06/22 1953   WPV:[id number] **OR** acetaminophen, magic mouthwash, ondansetron **OR** ondansetron (ZOFRAN) IV, senna-docusate  Antibiotics: Anti-infectives (From admission, onward)    Start     Dose/Rate Route Frequency Ordered Stop   04/05/22  2000  ceFEPIme (MAXIPIME) 2 g in sodium chloride 0.9 % 100 mL IVPB  Status:  Discontinued        2 g 200 mL/hr over 30 Minutes Intravenous Every 24 hours 04/04/22 2145 04/05/22 0915   04/04/22 2145  vancomycin variable dose per unstable renal function (pharmacist dosing)  Status:  Discontinued         Does not apply See admin instructions 04/04/22 2145 04/05/22 0915   04/04/22 2000  ceFEPIme (MAXIPIME) 2 g in sodium chloride 0.9 % 100 mL IVPB        2 g 200 mL/hr over 30 Minutes Intravenous  Once 04/04/22 1946 04/04/22 2058   04/04/22 2000  metroNIDAZOLE (FLAGYL) IVPB 500 mg        500 mg 100 mL/hr over 60 Minutes Intravenous  Once 04/04/22 1946 04/04/22 2249   04/04/22 2000  vancomycin (VANCOCIN)  IVPB 1000 mg/200 mL premix  Status:  Discontinued        1,000 mg 200 mL/hr over 60 Minutes Intravenous  Once 04/04/22 1946 04/04/22 1949   04/04/22 2000  vancomycin (VANCOREADY) IVPB 1250 mg/250 mL        1,250 mg 166.7 mL/hr over 90 Minutes Intravenous  Once 04/04/22 1949 04/04/22 2250       Objective:  Vital Signs  Vitals:   04/06/22 1939 04/06/22 1946 04/06/22 2312 04/07/22 0403  BP: (!) 85/68 90/66 124/60 (!) 100/47  Pulse: (!) 123   71  Resp: '20 18 20 15  '$ Temp: 97.8 F (36.6 C)  97.9 F (36.6 C) 97.8 F (36.6 C)  TempSrc: Oral  Oral Axillary  SpO2: 100% 100% 100% 100%  Weight:      Height:        Intake/Output Summary (Last 24 hours) at 04/07/2022 0857 Last data filed at 04/06/2022 2000 Gross per 24 hour  Intake 120 ml  Output --  Net 120 ml    Filed Weights   04/04/22 2142  Weight: 67.1 kg    General appearance: Awake alert.  In no distress.  Distracted. Resp: Clear to auscultation bilaterally.  Normal effort Cardio: S1-S2 is normal regular.  No S3-S4.  No rubs murmurs or bruit GI: Abdomen is soft.  Nontender nondistended.  Bowel sounds are present normal.  No masses organomegaly Extremities: No edema.  Physical deconditioning noted. Neurologic: No obvious focal neurological deficits  Lab Results:  Data Reviewed: I have personally reviewed following labs and reports of the imaging studies  CBC: Recent Labs  Lab 04/04/22 1946 04/05/22 0221 04/06/22 0050 04/07/22 0241 04/07/22 0712  WBC 8.0 8.5 6.4 5.3  --   NEUTROABS 5.0  --   --   --   --   HGB 9.0* 8.4* 8.0* 7.3* 7.1*  HCT 28.1* 25.6* 23.8* 21.8* 22.1*  MCV 115.2* 112.8* 111.2* 109.5*  --   PLT 174 172 166 178  --      Basic Metabolic Panel: Recent Labs  Lab 04/04/22 1946 04/05/22 0221 04/05/22 1530 04/06/22 0050 04/07/22 0241  NA 138 137  --  136 135  K 4.5 4.3  --  4.4 3.9  CL 107 105  --  109 111  CO2 25 23  --  23 19*  GLUCOSE 113* 112*  --  97 99  BUN 60* 58*  --  56* 54*   CREATININE 1.81* 1.68*  --  1.66* 1.47*  CALCIUM 7.7* 7.4*  --  7.2* 7.0*  MG  --   --  2.0  2.6*  --      GFR: Estimated Creatinine Clearance: 30.4 mL/min (A) (by C-G formula based on SCr of 1.47 mg/dL (H)).  Liver Function Tests: Recent Labs  Lab 04/04/22 1946  AST 38  ALT 19  ALKPHOS 96  BILITOT 1.0  PROT 5.2*  ALBUMIN 1.7*      Coagulation Profile: Recent Labs  Lab 04/04/22 1946  INR 1.3*       Thyroid Function Tests: Recent Labs    04/05/22 0221  TSH 1.091     Anemia Panel: Recent Labs    04/05/22 0221  VITAMINB12 306  FOLATE 15.7     Recent Results (from the past 240 hour(s))  Urine Culture     Status: None   Collection Time: 04/04/22  7:46 PM   Specimen: In/Out Cath Urine  Result Value Ref Range Status   Specimen Description IN/OUT CATH URINE  Final   Special Requests NONE  Final   Culture   Final    NO GROWTH Performed at [facility], [place] [address], [place], [place] [address]    Report Status 04/06/2022 FINAL  Final  Blood Culture (routine x 2)     Status: None (Preliminary result)   Collection Time: 04/04/22  8:00 PM   Specimen: BLOOD  Result Value Ref Range Status   Specimen Description BLOOD SITE NOT SPECIFIED  Final   Special Requests   Final    BOTTLES DRAWN AEROBIC AND ANAEROBIC Blood Culture adequate volume   Culture   Final    NO GROWTH 3 DAYS Performed at [facility], [place] [address], [place],  [address]    Report Status PENDING  Incomplete  Resp panel by RT-PCR (RSV, Flu A&B, Covid) Anterior Nasal Swab     Status: None   Collection Time: 04/04/22  8:26 PM   Specimen: Anterior Nasal Swab  Result Value Ref Range Status   SARS Coronavirus 2 by RT PCR NEGATIVE NEGATIVE Final    Comment: (NOTE) SARS-CoV-2 target nucleic acids are NOT DETECTED.  The SARS-CoV-2 RNA is generally detectable in upper respiratory specimens during the acute phase of infection. The lowest concentration of SARS-CoV-2 viral  copies this assay can detect is 138 copies/mL. A negative result does not preclude SARS-Cov-2 infection and should not be used as the sole basis for treatment or other patient management decisions. A negative result may occur with  improper specimen collection/handling, submission of specimen other than nasopharyngeal swab, presence of viral mutation(s) within the areas targeted by this assay, and inadequate number of viral copies(<138 copies/mL). A negative result must be combined with clinical observations, patient history, and epidemiological information. The expected result is Negative.  Fact Sheet for Patients:  EntrepreneurPulse.com.au  Fact Sheet for Healthcare Providers:  IncredibleEmployment.be  This test is no t yet approved or cleared by the [place] FDA and  has been authorized for detection and/or diagnosis of SARS-CoV-2 by FDA under an Emergency Use Authorization (EUA). This EUA will remain  in effect (meaning this test can be used) for the duration of the COVID-19 declaration under Section 564(b)(1) of the Act, 21 U.S.C.section 360bbb-3(b)(1), unless the authorization is terminated  or revoked sooner.       Influenza A by PCR NEGATIVE NEGATIVE Final   Influenza B by PCR NEGATIVE NEGATIVE Final    Comment: (NOTE) The Xpert Xpress SARS-CoV-2/FLU/RSV plus assay is intended as an aid in the diagnosis of influenza from Nasopharyngeal swab specimens and should not be  used as a sole basis for treatment. Nasal washings and aspirates are unacceptable for Xpert Xpress SARS-CoV-2/FLU/RSV testing.  Fact Sheet for Patients: EntrepreneurPulse.com.au  Fact Sheet for Healthcare Providers: IncredibleEmployment.be  This test is not yet approved or cleared by the [place] FDA and has been authorized for detection and/or diagnosis of SARS-CoV-2 by FDA under an Emergency Use Authorization (EUA). This  EUA will remain in effect (meaning this test can be used) for the duration of the COVID-19 declaration under Section 564(b)(1) of the Act, 21 U.S.C. section 360bbb-3(b)(1), unless the authorization is terminated or revoked.     Resp Syncytial Virus by PCR NEGATIVE NEGATIVE Final    Comment: (NOTE) Fact Sheet for Patients: EntrepreneurPulse.com.au  Fact Sheet for Healthcare Providers: IncredibleEmployment.be  This test is not yet approved or cleared by the [place] FDA and has been authorized for detection and/or diagnosis of SARS-CoV-2 by FDA under an Emergency Use Authorization (EUA). This EUA will remain in effect (meaning this test can be used) for the duration of the COVID-19 declaration under Section 564(b)(1) of the Act, 21 U.S.C. section 360bbb-3(b)(1), unless the authorization is terminated or revoked.  Performed at [facility], [place] [address] [place], [place] [address]   Blood Culture (routine x 2)     Status: None (Preliminary result)   Collection Time: 04/04/22  8:26 PM   Specimen: BLOOD  Result Value Ref Range Status   Specimen Description BLOOD SITE NOT SPECIFIED  Final   Special Requests   Final    BOTTLES DRAWN AEROBIC AND ANAEROBIC Blood Culture adequate volume   Culture   Final    NO GROWTH 3 DAYS Performed at [facility], [address], [place], [place] [address]    Report Status PENDING  Incomplete      Radiology Studies: DG Swallowing Func-Speech Pathology  Result Date: 04/05/2022 Table formatting from the original result was not included. Images from the original result were not included. Objective Swallowing Evaluation: Type of Study: MBS-Modified Barium Swallow Study  Patient Details Name: JEROMEY KRUER MRN: [id number] Date of Birth: 06-19-1927 Today's Date: 04/05/2022 Time: SLP Start Time (ACUTE ONLY): 1506 -SLP Stop Time (ACUTE ONLY): 5885 SLP Time Calculation (min) (ACUTE ONLY): 11 min Past  Medical History: Past Medical History: Diagnosis Date  Anxiety   Arthritis   Essential hypertension 04/16/2014  Hard of hearing   tinnitus both ears  Hypertension   Paroxysmal atrial fibrillation ([place]) 04/28/2014  Shoulder pain, left 07/14/2016  states left rotator pain preop Past Surgical History: Past Surgical History: Procedure Laterality Date  APPENDECTOMY  yrs ago  [place]  yrs ago  HIP CLOSED REDUCTION  07/01/2011, left  Procedure: [place];  Surgeon: Magnus Sinning, MD;  Location: [facility];  Service: Orthopedics;  Laterality: Left;  HIP CLOSED REDUCTION  06/11/2011  Procedure: CLOSED MANIPULATION HIP;  Surgeon: Magnus Sinning, MD;  Location: [facility];  Service: Orthopedics;  Laterality: Left;  HIP CLOSED REDUCTION  07/01/2011  Procedure: CLOSED MANIPULATION HIP;  Surgeon: Magnus Sinning, MD;  Location: [facility];  Service: Orthopedics;  Laterality: Left;  HIP CLOSED REDUCTION  12/08/2011  Procedure: CLOSED MANIPULATION HIP;  Surgeon: Sydnee Cabal, MD;  Location: [facility];  Service: Orthopedics;  Laterality: Left;  JOINT REPLACEMENT  2007 right, left 17 yrs ago  Bilateral hip  NM MYOVIEW LTD  2012  ORIF PERIPROSTHETIC FRACTURE Left 07/14/2016  Procedure: OPEN REDUCTION INTERNAL FIXATION (ORIF) PERIPROSTHETIC FRACTURE LEFT HIP;  Surgeon:  Paralee Cancel, MD;  Location: [facility];  Service: Orthopedics;  Laterality: Left;  TONSILLECTOMY  as child  TOTAL HIP REVISION  10/02/2011  Procedure: TOTAL HIP REVISION;  Surgeon: Gearlean Alf, MD;  Location: [facility];  Service: Orthopedics;  Laterality: Right;  TOTAL HIP REVISION  01/27/2012  Procedure: TOTAL HIP REVISION;  Surgeon: Gearlean Alf, MD;  Location: [facility];  Service: Orthopedics;  Laterality: Left; HPI: Mr. Damron is a 89 year old Caucasian male who was brought into the emergency department after EMS was called due to history of weakness ongoing for about 2 weeks.  Found to be in rapid a-fib and to have acute pulmonary embolism. CXR and Chest CT  1/20 with "Patchy atelectasis or infiltrate at the left lung base." Head CT 1/20 with no acute findings. Pt with past medical history of dementia, hearing impairment, paroxysmal atrial fibrillation not on anticoagulation.  Subjective: poor positioning  Recommendations for follow up therapy are one component of a multi-disciplinary discharge planning process, led by the attending physician.  Recommendations may be updated based on patient status, additional functional criteria and insurance authorization. Assessment / Plan / Recommendation   04/05/2022   3:54 PM Clinical Impressions Clinical Impression Pt presents with a mild-moderate oropharyngeal dysphagia c/b lingual discoordination, delayed swallow initiation, incomplete laryngeal closure, seemingely reduced UES opening, and diminished sensation. Pt also with very poor positioning which appears to impact swallow function.  SLP had assist pt to hold his head up for PO trials during MBS. These deficits resulted in silent aspiration of thin liquid before the swallow.  Cup presentation was not reliably beneficial to prevent aspiration.  Pt was unable to clear aspiration and did not follow instructions for cued cough.  With nectar thick liquid there was premature spillage to the pyriform sinuses but no penetration or aspiration with both cup and straw presentation.  Pt was unable to siphon honey thick liquid by straw.  There was no penetration of hone thick liquid by cup.  There was prolonged oral phase with reduced bolus cohesion.  Pt did not initiate swallow until bolus had fully transited from oral cavity with stasis of portions of bolus in pharyngeal stasis rather than use of piecemeal degultition.  With puree and regular solid texture there was no penetration or aspiration.  With solid texture in particular there was increased retention of contrast at UES, but no backflow to pharyx.  There was diffuse pharyngeal residue with all consistencies.  Pt is not a good  candidate for swallowing therapy 2/2 inability to follow directions consistently. Recommend mehcanical soft diet with nectar thick liquids. SLP Visit Diagnosis Dysphagia, oropharyngeal phase (R13.12) Impact on safety and function Moderate aspiration risk     04/05/2022   3:54 PM Treatment Recommendations Treatment Recommendations Therapy as outlined in treatment plan below     04/05/2022   4:02 PM Prognosis Prognosis for Safe Diet Advancement Fair   04/05/2022   3:54 PM Diet Recommendations SLP Diet Recommendations Dysphagia 3 (Mech soft) solids;Nectar thick liquid Liquid Administration via Cup;Straw Medication Administration Crushed with puree Compensations Slow rate;Small sips/bites;assist with positioning as needed Postural Changes Seated upright at 90 degrees     04/05/2022   3:54 PM Other Recommendations Oral Care Recommendations Oral care BID Other Recommendations Order thickener from pharmacy Follow Up Recommendations Skilled nursing-short term rehab (<3 hours/day) Functional Status Assessment Patient has had a recent decline in their functional status and demonstrates the ability to make significant improvements in function in a reasonable and predictable amount  of time.   04/05/2022   3:54 PM Frequency and Duration  Speech Therapy Frequency (ACUTE ONLY) min 2x/week Treatment Duration 2 weeks     04/05/2022   3:44 PM Oral Phase Oral Phase Impaired Oral - Honey Cup Decreased bolus cohesion;Delayed oral transit;Lingual pumping Oral - Nectar Cup Premature spillage;Decreased bolus cohesion Oral - Nectar Straw Premature spillage;Decreased bolus cohesion Oral - Thin Cup Premature spillage;Decreased bolus cohesion Oral - Thin Straw Premature spillage Oral - Puree Premature spillage;Delayed oral transit Oral - Regular Impaired mastication;Delayed oral transit;Premature spillage;Decreased bolus cohesion    04/05/2022   3:53 PM Pharyngeal Phase Pharyngeal Material does not enter airway Pharyngeal- Nectar Cup Delayed swallow  initiation-pyriform sinuses Pharyngeal Material does not enter airway Pharyngeal- Nectar Straw Delayed swallow initiation-pyriform sinuses Pharyngeal Material does not enter airway Pharyngeal- Thin Cup Delayed swallow initiation-pyriform sinuses;Reduced airway/laryngeal closure;Penetration/Aspiration before swallow Pharyngeal Material enters airway, passes BELOW cords without attempt by patient to eject out (silent aspiration) Pharyngeal- Thin Straw Delayed swallow initiation-pyriform sinuses;Reduced airway/laryngeal closure;Penetration/Aspiration before swallow Pharyngeal Material enters airway, passes BELOW cords without attempt by patient to eject out (silent aspiration) Pharyngeal- Puree Delayed swallow initiation-vallecula;Pharyngeal residue - cp segment Pharyngeal Material does not enter airway Pharyngeal- Regular Delayed swallow initiation-vallecula;Pharyngeal residue - cp segment Pharyngeal Material does not enter airway     No data to display    Celedonio Savage, MA, [place] Office: [phone] 04/05/2022, 4:06 PM                     ECHOCARDIOGRAM COMPLETE  Result Date: 04/05/2022    ECHOCARDIOGRAM REPORT   Patient Name:   OSEPH IMBURGIA Date of Exam: 04/05/2022 Medical Rec #:  [id number]        Height:       70.0 in Accession #:    [id number]       Weight:       147.9 lb Date of Birth:  1927-08-26         BSA:          1.836 m Patient Age:    52 years         BP:           101/52 mmHg Patient Gender: M                HR:           70 bpm. Exam Location:  Inpatient Procedure: 2D Echo Indications:    atrial fibrillation  History:        Patient has prior history of Echocardiogram examinations, most                 recent 06/30/2020. CHF; Risk Factors:Hypertension and                 Dyslipidemia.  Sonographer:    Harvie Junior Referring Phys: [phone] DEBBY CROSLEY  Sonographer Comments: Technically difficult study due to poor echo windows and no subcostal window. Image acquisition  challenging due to uncooperative patient and Image acquisition challenging due to respiratory motion. IMPRESSIONS  1. Technically difficult study. Left ventricular ejection fraction, by estimation, is 50 to 55%. The left ventricle has low normal function. Left ventricular endocardial border not optimally defined to evaluate regional wall motion. There is mild left ventricular hypertrophy. Left ventricular diastolic parameters are indeterminate.  2. Right ventricular systolic function is normal. The right ventricular size is normal.  3. The mitral valve is grossly normal. Trivial mitral valve  regurgitation. No evidence of mitral stenosis.  4. The aortic valve was not well visualized. Aortic valve regurgitation is trivial. Mild aortic valve stenosis. FINDINGS  Left Ventricle: Left ventricular ejection fraction, by estimation, is 50 to 55%. The left ventricle has low normal function. Left ventricular endocardial border not optimally defined to evaluate regional wall motion. The left ventricular internal cavity  size was small. There is mild left ventricular hypertrophy. Left ventricular diastolic parameters are indeterminate. Right Ventricle: The right ventricular size is normal. Right vetricular wall thickness was not well visualized. Right ventricular systolic function is normal. The tricuspid regurgitant velocity is 2.15 m/s, and with an assumed right atrial pressure of 3 mmHg, the estimated right ventricular systolic pressure is 62.5 mmHg. Left Atrium: Left atrial size was normal in size. Right Atrium: Right atrial size was normal in size. Pericardium: Trivial pericardial effusion is present. Presence of epicardial fat layer. Mitral Valve: The mitral valve is grossly normal. Trivial mitral valve regurgitation. No evidence of mitral valve stenosis. Tricuspid Valve: The tricuspid valve is not well visualized. Tricuspid valve regurgitation is not demonstrated. Aortic Valve: The aortic valve was not well visualized.  Aortic valve regurgitation is trivial. Aortic regurgitation PHT measures 296 msec. Mild aortic stenosis is present. Aortic valve mean gradient measures 7.4 mmHg. Aortic valve peak gradient measures 14.5 mmHg. Aortic valve area, by VTI measures 1.39 cm. Pulmonic Valve: The pulmonic valve was not well visualized. Pulmonic valve regurgitation is not visualized. Aorta: The aortic root is normal in size and structure. IAS/Shunts: The interatrial septum was not well visualized.  LEFT VENTRICLE PLAX 2D LVIDd:         3.20 cm     Diastology LVIDs:         2.00 cm     LV e' medial:    6.31 cm/s LV PW:         1.10 cm     LV E/e' medial:  13.1 LV IVS:        1.20 cm     LV e' lateral:   7.40 cm/s LVOT diam:     2.10 cm     LV E/e' lateral: 11.2 LV SV:         50 LV SV Index:   27 LVOT Area:     3.46 cm                             3D Volume EF: LV Volumes (MOD)           3D EF:        46 % LV vol d, MOD A2C: 82.7 ml LV EDV:       109 ml LV vol d, MOD A4C: 68.6 ml LV ESV:       59 ml LV vol s, MOD A2C: 36.9 ml LV SV:        50 ml LV vol s, MOD A4C: 31.3 ml LV SV MOD A2C:     45.8 ml LV SV MOD A4C:     68.6 ml LV SV MOD BP:      41.0 ml RIGHT VENTRICLE RV Basal diam:  3.20 cm RV Mid diam:    3.10 cm RV S prime:     13.37 cm/s TAPSE (M-mode): 1.7 cm LEFT ATRIUM             Index        RIGHT ATRIUM  Index LA Vol (A2C):   48.7 ml 26.52 ml/m  RA Area:     11.90 cm LA Vol (A4C):   48.9 ml 26.63 ml/m  RA Volume:   22.40 ml  12.20 ml/m LA Biplane Vol: 49.7 ml 27.07 ml/m  AORTIC VALVE                     PULMONIC VALVE AV Area (Vmax):    1.40 cm      PV Vmax:       0.92 m/s AV Area (Vmean):   1.43 cm      PV Peak grad:  3.4 mmHg AV Area (VTI):     1.39 cm AV Vmax:           190.40 cm/s AV Vmean:          128.000 cm/s AV VTI:            0.360 m AV Peak Grad:      14.5 mmHg AV Mean Grad:      7.4 mmHg LVOT Vmax:         77.20 cm/s LVOT Vmean:        52.750 cm/s LVOT VTI:          0.145 m LVOT/AV VTI ratio: 0.40 AI PHT:             296 msec  AORTA Ao Root diam: 3.80 cm MITRAL VALVE               TRICUSPID VALVE MV Area (PHT): 3.42 cm    TR Peak grad:   18.5 mmHg MV Decel Time: 222 msec    TR Vmax:        215.00 cm/s MR Peak grad: 27.0 mmHg MR Vmax:      260.00 cm/s  SHUNTS MV E velocity: 82.70 cm/s  Systemic VTI:  0.14 m MV A velocity: 58.50 cm/s  Systemic Diam: 2.10 cm MV E/A ratio:  1.41 Oswaldo Milian MD Electronically signed by Oswaldo Milian MD Signature Date/Time: 04/05/2022/2:16:29 PM    Final    VAS [place] LOWER EXTREMITY VENOUS (DVT)  Result Date: 04/05/2022  Lower Venous DVT Study Patient Name:  ANAV LAMMERT [facility]  Date of Exam:   04/05/2022 Medical Rec #: [id number]         Accession #:    [id number] Date of Birth: 01/29/1928          Patient Gender: M Patient Age:   9 years Exam Location:  [facility] Procedure:      VAS [place] LOWER EXTREMITY VENOUS (DVT) Referring Phys: DEBBY CROSLEY --------------------------------------------------------------------------------  Indications: Pulmonary embolism.  Comparison Study: no prior Performing Technologist: Archie Patten RVS  Examination Guidelines: A complete evaluation includes B-mode imaging, spectral Doppler, color Doppler, and power Doppler as needed of all accessible portions of each vessel. Bilateral testing is considered an integral part of a complete examination. Limited examinations for reoccurring indications may be performed as noted. The reflux portion of the exam is performed with the patient in reverse Trendelenburg.  +---------+---------------+---------+-----------+----------+--------------+ RIGHT    CompressibilityPhasicitySpontaneityPropertiesThrombus Aging +---------+---------------+---------+-----------+----------+--------------+ CFV      Full           Yes      Yes                                 +---------+---------------+---------+-----------+----------+--------------+ SFJ      Full                                                         +---------+---------------+---------+-----------+----------+--------------+  FV Prox  Full                                                        +---------+---------------+---------+-----------+----------+--------------+ FV Mid   Full                                                        +---------+---------------+---------+-----------+----------+--------------+ FV DistalFull           Yes      Yes                                 +---------+---------------+---------+-----------+----------+--------------+ PFV      Full                                                        +---------+---------------+---------+-----------+----------+--------------+ POP      Full           Yes      Yes                                 +---------+---------------+---------+-----------+----------+--------------+ PTV      Full           Yes      Yes                                 +---------+---------------+---------+-----------+----------+--------------+ PERO     Full           Yes      Yes                                 +---------+---------------+---------+-----------+----------+--------------+   +---------+---------------+---------+-----------+----------+--------------+ LEFT     CompressibilityPhasicitySpontaneityPropertiesThrombus Aging +---------+---------------+---------+-----------+----------+--------------+ CFV      Full           Yes      Yes                                 +---------+---------------+---------+-----------+----------+--------------+ SFJ      Full                                                        +---------+---------------+---------+-----------+----------+--------------+ FV Prox  Full                                                        +---------+---------------+---------+-----------+----------+--------------+ FV Mid  Full                                                         +---------+---------------+---------+-----------+----------+--------------+ FV DistalFull                                                        +---------+---------------+---------+-----------+----------+--------------+ PFV      Full                                                        +---------+---------------+---------+-----------+----------+--------------+ POP      Full           Yes      Yes                                 +---------+---------------+---------+-----------+----------+--------------+ PTV      Full                                                        +---------+---------------+---------+-----------+----------+--------------+ PERO     Full                                                        +---------+---------------+---------+-----------+----------+--------------+     Summary: BILATERAL: - No evidence of deep vein thrombosis seen in the lower extremities, bilaterally. -No evidence of popliteal cyst, bilaterally.   *See table(s) above for measurements and observations. Electronically signed by Harold Barban MD on 04/05/2022 at 2:13:44 PM.    Final        LOS: 3 days   [facility] Pager on [email or web address]  04/07/2022, 8:57 AM

## 2022-04-07 NOTE — Progress Notes (Signed)
Patient pulled foley earlier in shift. Bled initially, but stopped with pressure.   Alcario Drought MD, notified, advised to start a urine trial and be ready to replace the foley is pt does not urinate.   Pt has started to bleed again, Heparin stopped, Alcario Drought MD, notified.   RN placed condom cath to monitor urine and bleeding, MD advised.  MD stated he will place orders.

## 2022-04-07 NOTE — Progress Notes (Signed)
Patient drank two sips of contrast and was unable/unwilling to drink any more.

## 2022-04-07 NOTE — Progress Notes (Signed)
Rounding Note    Patient Name: Tom Ortiz Date of Encounter: 04/07/2022  [place] Cardiologist: Shelva Majestic, MD   Subjective   Pt remains confused but denies CP or dyspnea  Inpatient Medications    Scheduled Meds:  aspirin EC  81 mg Oral Daily   atorvastatin  20 mg Oral Daily   Chlorhexidine Gluconate Cloth  6 each Topical Daily   vitamin B-12  500 mcg Oral Daily   memantine  5 mg Oral BID   metoprolol tartrate  2.5 mg Intravenous Once   protamine  25 mg Intravenous STAT   tamsulosin  0.4 mg Oral Daily   Continuous Infusions:  sodium chloride 75 mL/hr at 04/06/22 1953   heparin Stopped (04/07/22 0630)   PRN Meds: acetaminophen **OR** acetaminophen, magic mouthwash, metoprolol tartrate, ondansetron **OR** ondansetron (ZOFRAN) IV, senna-docusate   Vital Signs    Vitals:   04/06/22 1939 04/06/22 1946 04/06/22 2312 04/07/22 0403  BP: (!) 85/68 90/66 124/60 (!) 100/47  Pulse: (!) 123   71  Resp: '20 18 20 15  '$ Temp: 97.8 F (36.6 C)  97.9 F (36.6 C) 97.8 F (36.6 C)  TempSrc: Oral  Oral Axillary  SpO2: 100% 100% 100% 100%  Weight:      Height:        Intake/Output Summary (Last 24 hours) at 04/07/2022 9509 Last data filed at 04/06/2022 2000 Gross per 24 hour  Intake 120 ml  Output --  Net 120 ml       04/04/2022    9:42 PM 12/03/2020   11:55 AM 07/24/2020   11:19 AM  Last 3 Weights  Weight (lbs) 147 lb 14.4 oz 143 lb 155 lb 9.6 oz  Weight (kg) 67.087 kg 64.864 kg 70.58 kg      Telemetry    Atrial fibrillation rate controlled - Personally Reviewed   Physical Exam   GEN: NAD Neck: Supple Cardiac: irregular, no rub Respiratory: CTA GI: Soft, NT/ND MS: No edema Neuro:  Confused; Nonfocal   Labs    High Sensitivity Troponin:   Recent Labs  Lab 04/04/22 1946 04/04/22 2315  TROPONINIHS 215* 199*      Chemistry Recent Labs  Lab 04/04/22 1946 04/05/22 0221 04/05/22 1530 04/06/22 0050 04/07/22 0241  NA 138 137  --   136 135  K 4.5 4.3  --  4.4 3.9  CL 107 105  --  109 111  CO2 25 23  --  23 19*  GLUCOSE 113* 112*  --  97 99  BUN 60* 58*  --  56* 54*  CREATININE 1.81* 1.68*  --  1.66* 1.47*  CALCIUM 7.7* 7.4*  --  7.2* 7.0*  MG  --   --  2.0 2.6*  --   PROT 5.2*  --   --   --   --   ALBUMIN 1.7*  --   --   --   --   AST 38  --   --   --   --   ALT 19  --   --   --   --   ALKPHOS 96  --   --   --   --   BILITOT 1.0  --   --   --   --   GFRNONAA 35* 38*  --  38* 44*  ANIONGAP 6 9  --  4* 5      Hematology Recent Labs  Lab 04/05/22 0221 04/06/22 0050 04/07/22 0241  04/07/22 0712  WBC 8.5 6.4 5.3  --   RBC 2.27* 2.14* 1.99*  --   HGB 8.4* 8.0* 7.3* 7.1*  HCT 25.6* 23.8* 21.8* 22.1*  MCV 112.8* 111.2* 109.5*  --   MCH 37.0* 37.4* 36.7*  --   MCHC 32.8 33.6 33.5  --   RDW 15.3 15.0 15.0  --   PLT 172 166 178  --     Thyroid  Recent Labs  Lab 04/05/22 0221  TSH 1.091     Radiology    DG Swallowing Func-Speech Pathology  Result Date: 04/05/2022 Table formatting from the original result was not included. Images from the original result were not included. Objective Swallowing Evaluation: Type of Study: MBS-Modified Barium Swallow Study  Patient Details Name: Tom Ortiz MRN: [id number] Date of Birth: Feb 27, 1928 Today's Date: 04/05/2022 Time: SLP Start Time (ACUTE ONLY): 1506 -SLP Stop Time (ACUTE ONLY): 2993 SLP Time Calculation (min) (ACUTE ONLY): 11 min Past Medical History: Past Medical History: Diagnosis Date  Anxiety   Arthritis   Essential hypertension 04/16/2014  Hard of hearing   tinnitus both ears  Hypertension   Paroxysmal atrial fibrillation ([place]) 04/28/2014  Shoulder pain, left 07/14/2016  states left rotator pain preop Past Surgical History: Past Surgical History: Procedure Laterality Date  APPENDECTOMY  yrs ago  [place]  yrs ago  HIP CLOSED REDUCTION  07/01/2011, left  Procedure: [place];  Surgeon: Magnus Sinning, MD;  Location: [facility];  Service:  Orthopedics;  Laterality: Left;  HIP CLOSED REDUCTION  06/11/2011  Procedure: CLOSED MANIPULATION HIP;  Surgeon: Magnus Sinning, MD;  Location: [facility];  Service: Orthopedics;  Laterality: Left;  HIP CLOSED REDUCTION  07/01/2011  Procedure: CLOSED MANIPULATION HIP;  Surgeon: Magnus Sinning, MD;  Location: [facility];  Service: Orthopedics;  Laterality: Left;  HIP CLOSED REDUCTION  12/08/2011  Procedure: CLOSED MANIPULATION HIP;  Surgeon: Sydnee Cabal, MD;  Location: [facility];  Service: Orthopedics;  Laterality: Left;  JOINT REPLACEMENT  2007 right, left 17 yrs ago  Bilateral hip  NM MYOVIEW LTD  2012  ORIF PERIPROSTHETIC FRACTURE Left 07/14/2016  Procedure: OPEN REDUCTION INTERNAL FIXATION (ORIF) PERIPROSTHETIC FRACTURE LEFT HIP;  Surgeon: Paralee Cancel, MD;  Location: [facility];  Service: Orthopedics;  Laterality: Left;  TONSILLECTOMY  as child  TOTAL HIP REVISION  10/02/2011  Procedure: TOTAL HIP REVISION;  Surgeon: Gearlean Alf, MD;  Location: [facility];  Service: Orthopedics;  Laterality: Right;  TOTAL HIP REVISION  01/27/2012  Procedure: TOTAL HIP REVISION;  Surgeon: Gearlean Alf, MD;  Location: [facility];  Service: Orthopedics;  Laterality: Left; HPI: [name] is a 89 year old Caucasian male who was brought into the emergency department after EMS was called due to history of weakness ongoing for about 2 weeks.  Found to be in rapid a-fib and to have acute pulmonary embolism. CXR and Chest CT 1/20 with "Patchy atelectasis or infiltrate at the left lung base." Head CT 1/20 with no acute findings. Pt with past medical history of dementia, hearing impairment, paroxysmal atrial fibrillation not on anticoagulation.  Subjective: poor positioning  Recommendations for follow up therapy are one component of a multi-disciplinary discharge planning process, led by the attending physician.  Recommendations may be updated based on patient status, additional functional criteria and insurance authorization. Assessment / Plan /  Recommendation   04/05/2022   3:54 PM Clinical Impressions Clinical Impression Pt presents with a mild-moderate oropharyngeal dysphagia c/b lingual discoordination, delayed swallow initiation, incomplete laryngeal  closure, seemingely reduced UES opening, and diminished sensation. Pt also with very poor positioning which appears to impact swallow function.  SLP had assist pt to hold his head up for PO trials during MBS. These deficits resulted in silent aspiration of thin liquid before the swallow.  Cup presentation was not reliably beneficial to prevent aspiration.  Pt was unable to clear aspiration and did not follow instructions for cued cough.  With nectar thick liquid there was premature spillage to the pyriform sinuses but no penetration or aspiration with both cup and straw presentation.  Pt was unable to siphon honey thick liquid by straw.  There was no penetration of hone thick liquid by cup.  There was prolonged oral phase with reduced bolus cohesion.  Pt did not initiate swallow until bolus had fully transited from oral cavity with stasis of portions of bolus in pharyngeal stasis rather than use of piecemeal degultition.  With puree and regular solid texture there was no penetration or aspiration.  With solid texture in particular there was increased retention of contrast at UES, but no backflow to pharyx.  There was diffuse pharyngeal residue with all consistencies.  Pt is not a good candidate for swallowing therapy 2/2 inability to follow directions consistently. Recommend mehcanical soft diet with nectar thick liquids. SLP Visit Diagnosis Dysphagia, oropharyngeal phase (R13.12) Impact on safety and function Moderate aspiration risk     04/05/2022   3:54 PM Treatment Recommendations Treatment Recommendations Therapy as outlined in treatment plan below     04/05/2022   4:02 PM Prognosis Prognosis for Safe Diet Advancement Fair   04/05/2022   3:54 PM Diet Recommendations SLP Diet Recommendations Dysphagia 3  (Mech soft) solids;Nectar thick liquid Liquid Administration via Cup;Straw Medication Administration Crushed with puree Compensations Slow rate;Small sips/bites;assist with positioning as needed Postural Changes Seated upright at 90 degrees     04/05/2022   3:54 PM Other Recommendations Oral Care Recommendations Oral care BID Other Recommendations Order thickener from pharmacy Follow Up Recommendations Skilled nursing-short term rehab (<3 hours/day) Functional Status Assessment Patient has had a recent decline in their functional status and demonstrates the ability to make significant improvements in function in a reasonable and predictable amount of time.   04/05/2022   3:54 PM Frequency and Duration  Speech Therapy Frequency (ACUTE ONLY) min 2x/week Treatment Duration 2 weeks     04/05/2022   3:44 PM Oral Phase Oral Phase Impaired Oral - Honey Cup Decreased bolus cohesion;Delayed oral transit;Lingual pumping Oral - Nectar Cup Premature spillage;Decreased bolus cohesion Oral - Nectar Straw Premature spillage;Decreased bolus cohesion Oral - Thin Cup Premature spillage;Decreased bolus cohesion Oral - Thin Straw Premature spillage Oral - Puree Premature spillage;Delayed oral transit Oral - Regular Impaired mastication;Delayed oral transit;Premature spillage;Decreased bolus cohesion    04/05/2022   3:53 PM Pharyngeal Phase Pharyngeal Material does not enter airway Pharyngeal- Nectar Cup Delayed swallow initiation-pyriform sinuses Pharyngeal Material does not enter airway Pharyngeal- Nectar Straw Delayed swallow initiation-pyriform sinuses Pharyngeal Material does not enter airway Pharyngeal- Thin Cup Delayed swallow initiation-pyriform sinuses;Reduced airway/laryngeal closure;Penetration/Aspiration before swallow Pharyngeal Material enters airway, passes BELOW cords without attempt by patient to eject out (silent aspiration) Pharyngeal- Thin Straw Delayed swallow initiation-pyriform sinuses;Reduced airway/laryngeal  closure;Penetration/Aspiration before swallow Pharyngeal Material enters airway, passes BELOW cords without attempt by patient to eject out (silent aspiration) Pharyngeal- Puree Delayed swallow initiation-vallecula;Pharyngeal residue - cp segment Pharyngeal Material does not enter airway Pharyngeal- Regular Delayed swallow initiation-vallecula;Pharyngeal residue - cp segment Pharyngeal Material does not enter airway  No data to display    Celedonio Savage, MA, [place] Office: [phone] 04/05/2022, 4:06 PM                     ECHOCARDIOGRAM COMPLETE  Result Date: 04/05/2022    ECHOCARDIOGRAM REPORT   Patient Name:   Tom Ortiz Date of Exam: 04/05/2022 Medical Rec #:  [id number]        Height:       70.0 in Accession #:    [id number]       Weight:       147.9 lb Date of Birth:  1927/12/14         BSA:          1.836 m Patient Age:    89 years         BP:           101/52 mmHg Patient Gender: M                HR:           70 bpm. Exam Location:  Inpatient Procedure: 2D Echo Indications:    atrial fibrillation  History:        Patient has prior history of Echocardiogram examinations, most                 recent 06/30/2020. CHF; Risk Factors:Hypertension and                 Dyslipidemia.  Sonographer:    Harvie Junior Referring Phys: [phone] DEBBY CROSLEY  Sonographer Comments: Technically difficult study due to poor echo windows and no subcostal window. Image acquisition challenging due to uncooperative patient and Image acquisition challenging due to respiratory motion. IMPRESSIONS  1. Technically difficult study. Left ventricular ejection fraction, by estimation, is 50 to 55%. The left ventricle has low normal function. Left ventricular endocardial border not optimally defined to evaluate regional wall motion. There is mild left ventricular hypertrophy. Left ventricular diastolic parameters are indeterminate.  2. Right ventricular systolic function is normal. The right ventricular  size is normal.  3. The mitral valve is grossly normal. Trivial mitral valve regurgitation. No evidence of mitral stenosis.  4. The aortic valve was not well visualized. Aortic valve regurgitation is trivial. Mild aortic valve stenosis. FINDINGS  Left Ventricle: Left ventricular ejection fraction, by estimation, is 50 to 55%. The left ventricle has low normal function. Left ventricular endocardial border not optimally defined to evaluate regional wall motion. The left ventricular internal cavity  size was small. There is mild left ventricular hypertrophy. Left ventricular diastolic parameters are indeterminate. Right Ventricle: The right ventricular size is normal. Right vetricular wall thickness was not well visualized. Right ventricular systolic function is normal. The tricuspid regurgitant velocity is 2.15 m/s, and with an assumed right atrial pressure of 3 mmHg, the estimated right ventricular systolic pressure is 45.8 mmHg. Left Atrium: Left atrial size was normal in size. Right Atrium: Right atrial size was normal in size. Pericardium: Trivial pericardial effusion is present. Presence of epicardial fat layer. Mitral Valve: The mitral valve is grossly normal. Trivial mitral valve regurgitation. No evidence of mitral valve stenosis. Tricuspid Valve: The tricuspid valve is not well visualized. Tricuspid valve regurgitation is not demonstrated. Aortic Valve: The aortic valve was not well visualized. Aortic valve regurgitation is trivial. Aortic regurgitation PHT measures 296 msec. Mild aortic stenosis is present. Aortic valve mean gradient measures 7.4 mmHg. Aortic valve peak gradient measures 14.5  mmHg. Aortic valve area, by VTI measures 1.39 cm. Pulmonic Valve: The pulmonic valve was not well visualized. Pulmonic valve regurgitation is not visualized. Aorta: The aortic root is normal in size and structure. IAS/Shunts: The interatrial septum was not well visualized.  LEFT VENTRICLE PLAX 2D LVIDd:         3.20 cm      Diastology LVIDs:         2.00 cm     LV e' medial:    6.31 cm/s LV PW:         1.10 cm     LV E/e' medial:  13.1 LV IVS:        1.20 cm     LV e' lateral:   7.40 cm/s LVOT diam:     2.10 cm     LV E/e' lateral: 11.2 LV SV:         50 LV SV Index:   27 LVOT Area:     3.46 cm                             3D Volume EF: LV Volumes (MOD)           3D EF:        46 % LV vol d, MOD A2C: 82.7 ml LV EDV:       109 ml LV vol d, MOD A4C: 68.6 ml LV ESV:       59 ml LV vol s, MOD A2C: 36.9 ml LV SV:        50 ml LV vol s, MOD A4C: 31.3 ml LV SV MOD A2C:     45.8 ml LV SV MOD A4C:     68.6 ml LV SV MOD BP:      41.0 ml RIGHT VENTRICLE RV Basal diam:  3.20 cm RV Mid diam:    3.10 cm RV S prime:     13.37 cm/s TAPSE (M-mode): 1.7 cm LEFT ATRIUM             Index        RIGHT ATRIUM           Index LA Vol (A2C):   48.7 ml 26.52 ml/m  RA Area:     11.90 cm LA Vol (A4C):   48.9 ml 26.63 ml/m  RA Volume:   22.40 ml  12.20 ml/m LA Biplane Vol: 49.7 ml 27.07 ml/m  AORTIC VALVE                     PULMONIC VALVE AV Area (Vmax):    1.40 cm      PV Vmax:       0.92 m/s AV Area (Vmean):   1.43 cm      PV Peak grad:  3.4 mmHg AV Area (VTI):     1.39 cm AV Vmax:           190.40 cm/s AV Vmean:          128.000 cm/s AV VTI:            0.360 m AV Peak Grad:      14.5 mmHg AV Mean Grad:      7.4 mmHg LVOT Vmax:         77.20 cm/s LVOT Vmean:        52.750 cm/s LVOT VTI:          0.145 m LVOT/AV VTI ratio:  0.40 AI PHT:            296 msec  AORTA Ao Root diam: 3.80 cm MITRAL VALVE               TRICUSPID VALVE MV Area (PHT): 3.42 cm    TR Peak grad:   18.5 mmHg MV Decel Time: 222 msec    TR Vmax:        215.00 cm/s MR Peak grad: 27.0 mmHg MR Vmax:      260.00 cm/s  SHUNTS MV E velocity: 82.70 cm/s  Systemic VTI:  0.14 m MV A velocity: 58.50 cm/s  Systemic Diam: 2.10 cm MV E/A ratio:  1.41 Oswaldo Milian MD Electronically signed by Oswaldo Milian MD Signature Date/Time: 04/05/2022/2:16:29 PM    Final    VAS [place] LOWER  EXTREMITY VENOUS (DVT)  Result Date: 04/05/2022  Lower Venous DVT Study Patient Name:  Tom Ortiz [facility]  Date of Exam:   04/05/2022 Medical Rec #: [id number]         Accession #:    [id number] Date of Birth: 1928-02-26          Patient Gender: M Patient Age:   3 years Exam Location:  [facility] Procedure:      VAS [place] LOWER EXTREMITY VENOUS (DVT) Referring Phys: DEBBY CROSLEY --------------------------------------------------------------------------------  Indications: Pulmonary embolism.  Comparison Study: no prior Performing Technologist: Archie Patten RVS  Examination Guidelines: A complete evaluation includes B-mode imaging, spectral Doppler, color Doppler, and power Doppler as needed of all accessible portions of each vessel. Bilateral testing is considered an integral part of a complete examination. Limited examinations for reoccurring indications may be performed as noted. The reflux portion of the exam is performed with the patient in reverse Trendelenburg.  +---------+---------------+---------+-----------+----------+--------------+ RIGHT    CompressibilityPhasicitySpontaneityPropertiesThrombus Aging +---------+---------------+---------+-----------+----------+--------------+ CFV      Full           Yes      Yes                                 +---------+---------------+---------+-----------+----------+--------------+ SFJ      Full                                                        +---------+---------------+---------+-----------+----------+--------------+ FV Prox  Full                                                        +---------+---------------+---------+-----------+----------+--------------+ FV Mid   Full                                                        +---------+---------------+---------+-----------+----------+--------------+ FV DistalFull           Yes      Yes                                  +---------+---------------+---------+-----------+----------+--------------+  PFV      Full                                                        +---------+---------------+---------+-----------+----------+--------------+ POP      Full           Yes      Yes                                 +---------+---------------+---------+-----------+----------+--------------+ PTV      Full           Yes      Yes                                 +---------+---------------+---------+-----------+----------+--------------+ PERO     Full           Yes      Yes                                 +---------+---------------+---------+-----------+----------+--------------+   +---------+---------------+---------+-----------+----------+--------------+ LEFT     CompressibilityPhasicitySpontaneityPropertiesThrombus Aging +---------+---------------+---------+-----------+----------+--------------+ CFV      Full           Yes      Yes                                 +---------+---------------+---------+-----------+----------+--------------+ SFJ      Full                                                        +---------+---------------+---------+-----------+----------+--------------+ FV Prox  Full                                                        +---------+---------------+---------+-----------+----------+--------------+ FV Mid   Full                                                        +---------+---------------+---------+-----------+----------+--------------+ FV DistalFull                                                        +---------+---------------+---------+-----------+----------+--------------+ PFV      Full                                                        +---------+---------------+---------+-----------+----------+--------------+  POP      Full           Yes      Yes                                  +---------+---------------+---------+-----------+----------+--------------+ PTV      Full                                                        +---------+---------------+---------+-----------+----------+--------------+ PERO     Full                                                        +---------+---------------+---------+-----------+----------+--------------+     Summary: BILATERAL: - No evidence of deep vein thrombosis seen in the lower extremities, bilaterally. -No evidence of popliteal cyst, bilaterally.   *See table(s) above for measurements and observations. Electronically signed by Harold Barban MD on 04/05/2022 at 2:13:44 PM.    Final     Patient Profile     89 y.o. male with past medical history of dementia, paroxysmal atrial fibrillation, heart failure reduced EF, hypertension admitted with pulmonary embolus for evaluation of atrial fibrillation with rapid ventricular response.  CTA this admission shows bilateral lower lobe pulmonary emboli and coronary calcification.  Lower extremity venous Dopplers no DVT.  Echocardiogram showed ejection fraction 50 to 55%, mild left ventricular hypertrophy and mild aortic stenosis but mean gradient noted to be 7.4 mmHg.  Assessment & Plan    1 persistent atrial fibrillation-patient remains mildly confused this morning.  His heart rate increased yesterday with ambulation.  Will treat with metoprolol 25 mg twice daily.  Note he was on Toprol 50 mg daily at home.  Follow heart rate and adjust as needed.  2 acute pulmonary embolus-patient apparently had hematuria after pulling his Foley out yesterday.  Presently on heparin.  Transition to apixaban for pulmonary embolus once it is clear bleeding has stopped.  Note I do not think he is a good long-term anticoagulation candidate.  3 elevated troponin-minimal elevation with no clear trend.  Patient also without chest pain.  Not consistent with acute coronary syndrome.  4 history of heart failure  reduced EF-patient not volume overloaded on examination.  Would continue to hold diuretics.  5 history of coronary artery disease-patient denies chest pain.  Continue statin.  Would discontinue aspirin when apixaban is initiated.  6 acute kidney injury-Per primary service.  7 macrocytic anemia further evaluation per primary care.  8 no CODE BLUE  For questions or updates, please contact [place] Please consult [email or web address] for contact info under        Signed, Kirk Ruths, MD  04/07/2022, 8:32 AM

## 2022-04-07 NOTE — Progress Notes (Signed)
OT Cancellation Note  Patient Details Name: Tom Ortiz MRN: [id number] DOB: 1927/09/21   Cancelled Treatment:    Reason Eval/Treat Not Completed: Other (comment)- spoke to MD, holding OT today as pt off anticoagulation at this time. Will follow as see as appropriate and able.   [place] 04/07/2022, 11:12 AM

## 2022-04-07 NOTE — Progress Notes (Signed)
Called [facility] [place]) for verbal consent for blood over phone with Primary Rn Delsa Sale. Consent placed I chart   Phoebe Sharps, RN

## 2022-04-08 DIAGNOSIS — I480 Paroxysmal atrial fibrillation: Secondary | ICD-10-CM

## 2022-04-08 DIAGNOSIS — N179 Acute kidney failure, unspecified: Secondary | ICD-10-CM | POA: Diagnosis not present

## 2022-04-08 DIAGNOSIS — R319 Hematuria, unspecified: Secondary | ICD-10-CM | POA: Diagnosis not present

## 2022-04-08 DIAGNOSIS — I482 Chronic atrial fibrillation, unspecified: Secondary | ICD-10-CM | POA: Diagnosis not present

## 2022-04-08 DIAGNOSIS — I2699 Other pulmonary embolism without acute cor pulmonale: Secondary | ICD-10-CM | POA: Diagnosis not present

## 2022-04-08 LAB — BASIC METABOLIC PANEL
Anion gap: 5 (ref 5–15)
BUN: 50 mg/dL — ABNORMAL HIGH (ref 8–23)
CO2: 20 mmol/L — ABNORMAL LOW (ref 22–32)
Calcium: 7.2 mg/dL — ABNORMAL LOW (ref 8.9–10.3)
Chloride: 112 mmol/L — ABNORMAL HIGH (ref 98–111)
Creatinine, Ser: 1.5 mg/dL — ABNORMAL HIGH (ref 0.61–1.24)
GFR, Estimated: 43 mL/min — ABNORMAL LOW (ref >60)
Glucose, Bld: 94 mg/dL (ref 70–99)
Potassium: 4.2 mmol/L (ref 3.5–5.1)
Sodium: 137 mmol/L (ref 135–145)

## 2022-04-08 LAB — CBC
HCT: 26.5 % — ABNORMAL LOW (ref 39.0–52.0)
Hemoglobin: 8.9 g/dL — ABNORMAL LOW (ref 13.0–17.0)
MCH: 35.2 pg — ABNORMAL HIGH (ref 26.0–34.0)
MCHC: 33.6 g/dL (ref 30.0–36.0)
MCV: 104.7 fL — ABNORMAL HIGH (ref 80.0–100.0)
Platelets: 197 10*3/uL (ref 150–400)
RBC: 2.53 MIL/uL — ABNORMAL LOW (ref 4.22–5.81)
RDW: 17.4 % — ABNORMAL HIGH (ref 11.5–15.5)
WBC: 6.9 10*3/uL (ref 4.0–10.5)
nRBC: 0 % (ref 0.0–0.2)

## 2022-04-08 LAB — BPAM RBC
Blood Product Expiration Date: 202402252359
ISSUE DATE / TIME: 202401231449
Unit Type and Rh: 5100

## 2022-04-08 LAB — TYPE AND SCREEN
ABO/RH(D): O POS
Antibody Screen: NEGATIVE
Unit division: 0

## 2022-04-08 NOTE — Progress Notes (Signed)
Patient able to drink 350 ml slowly. Patient rolls solid food around in mouth an has to be encouraged to swallow. Complains of sore mouth and throat

## 2022-04-08 NOTE — Progress Notes (Signed)
OT Cancellation Note  Patient Details Name: MEYER ARORA MRN: [id number] DOB: Jun 21, 1927   Cancelled Treatment:    Reason Eval/Treat Not Completed: Medical issues which prohibited therapy (pt still off anticoagulation, [place] Pink MD asking OT to hold today, of note also plan for possible palliative mtg. Will follow up for OT evaluation as appropriate.)  Renaye Rakers, OTD, OTR/L [facility] [phone] [place] 04/08/2022, 8:28 AM

## 2022-04-08 NOTE — Progress Notes (Signed)
Patient didn't like food for supper, burns throat and mouth. Prn meds given for this. Patient is able to drink nectar thick liquids a lot better.

## 2022-04-08 NOTE — Progress Notes (Addendum)
[facility] PROGRESS NOTE   Tom Ortiz NWG:[id number] DOB: 07/31/1927 DOA: 04/04/2022  PCP: Pcp, No  Brief History/Interval Summary: 89 year old Caucasian male with past medical history of dementia, hearing impairment, paroxysmal atrial fibrillation not on anticoagulation who was brought into the emergency department after EMS was called due to history of weakness ongoing for about 2 weeks.  He was noted to be hypotensive and tachycardic when EMS evaluated him.  He was brought into the hospital for further management.  Found to be in rapid atrial fibrillation.  Blood pressure improved after fluid resuscitation.  He was found to have acute pulmonary embolism.  He was hospitalized for further management.  Consultants: Cardiology  Procedures: Echocardiogram    Subjective/Interval History: Patient noted to be lethargic but easily arousable.  Pleasantly confused.  Does not appear to be in any discomfort.    Assessment/Plan:  Atrial fibrillation with RVR with a known history of chronic atrial fibrillation Came in with RVR which improved after he hydration.  He was found to have pulmonary embolism which could have been the inciting event. TSH 1.09.   Cardiology was consulted.  Previously not on anticoagulation due to age, anemia, dementia, frail status. Had a few episodes of RVR.  There was also some concern for ventricular tachycardia but it was likely atrial fibrillation with aberrancy.  He was given a dose of amiodarone.  He converted to sinus rhythm. Continue with oral metoprolol.  Hematuria Patient pulled his Foley catheter out on 1/22.  Developed hematuria.  Heparin was stopped.  Coude catheter had to be placed.  Continue with irrigations.  Drop in hemoglobin noted for which he was transfused as mentioned below.  Continue to monitor for now.  Continue to check hemoglobin.    Macrocytic anemia/acute blood loss anemia Baseline hemoglobin seems to be around 10-11.  Came in  with hemoglobin of 9.   Drop in hemoglobin partly dilutional and partly due to hematuria. Hemoglobin dropped to 6.9.  He was given 1 unit of PRBC.  Hemoglobin improved and noted to be 8.9 this morning.  Continue to monitor daily. Folic acid level 57.8.  Vitamin B12 306.  Continue B12 supplementation. Initially plan was to check stool for occult blood.  However considering all of his comorbidities including dementia and poor baseline quality of life he is not a candidate for any endoscopies.  Will hold off on further testing.  Acute pulmonary embolism/acute respiratory failure with hypoxia CT angiogram showed pulmonary embolism. Patient was started on heparin. No DVT noted on lower extremity Doppler studies. Echocardiogram shows LVEF to be 50 to 55%.  Right ventricular function was noted to be normal.  No significant valvular abnormalities noted.  Mild AS was noted. Patient was also empirically started on antibiotics at the time of admission but no infection identified.  Antibiotics were discontinued. Patient has been weaned off of oxygen Patient had significant hematuria after he pulled his Foley out.  Heparin had to be discontinued.  Continues to have bleeding through his catheter. This is a difficult situation. Due to his dementia and other comorbidities he is not a candidate for any kind of invasive workup. Even invasive interventions such as IVC filter may not be appropriate. Palliative care is following.  Family understands patient's current situation.  They are open to de-escalating care and transition to hospice depending on how he does over the next 24 to 48 hours.  Hypotension, unspecified Could have been due to hypovolemia as he did have acute kidney injury.  Perhaps has had poor oral intake since he has been weak for 2 weeks.  Blood pressure improved with fluid resuscitation Lactic acid levels improved. Blood pressure is stable.  Acute kidney injury As of 2022 is baseline creatinine  was around 1.19.   Came in with creatinine of 1.81.  Thought to be due to hypovolemia.  Patient was given IV fluids.  Creatinine is stable for the most part.    Unspecified abdominal pain, chronic [name] mentions longstanding abdominal pain.  He is wonders if this could be the reason for patient's poor appetite. CT scan of the abdomen pelvis was done which showed nonspecific findings.  Abdominal aneurysm was noted at 4.9 cm.  Lithiasis and nephrolithiasis was noted.  LFTs were noted to be normal.  No other acute findings noted on CT scan.  His abdomen is benign.  Do not anticipate any further workup.  Patient seems to be asymptomatic for the most part.  His oral intake is poor but he does not have any nausea or vomiting.    Chronic systolic CHF EF based on echo from 2022 is 30 to 35%.  Regional wall motion abnormalities and global hypokinesis was noted at that time. Echocardiogram done during this admission showed improved EF at 50 to 55%. Due to his cognitive deficits/dementia not a candidate for any aggressive cardiac interventions. Mildly elevated troponin levels are secondary to PE, tachyarrhythmia and hypotension.  Unlikely to be ACS.  History of dementia with some delirium Continue Namenda.  Seizure disorder Informed that patient was not on keppra.  Keppra was discontinued.    History of closed intertrochanteric fracture of the left hip with delayed healing Has seen orthopedics previously.  Last surgery was 10 years ago.  Up until 2 weeks ago patient was ambulatory in the house using a walker.  Goals of care Have discussed with patient's son over the last few days.  Patient's CODE STATUS was changed over to DNR/DNI.  Patient with complicated health issues requiring extensive decision making on the part of patient's son.   Palliative care was consulted.   In summary this is a 89 year old male with dementia and other comorbidities who comes in with several acute medical issues.   Patient has had poor quality of life for several months and especially in the last few weeks.  In view of all of this, aggressive care including even anticoagulation is probably not indicated.  Now with hematuria which further limits treatment options in the acute setting.  Will wait and see how the patient does over next 24hrs. He is more lethargic today. Has had poor oral intake. If there is no improvement then transition to hospice/comfort care would be appropriate.  DVT Prophylaxis: IV heparin is currently on hold. Code Status: DNR Family Communication: No family at bedside.  Son being updated on a daily basis. Disposition Plan: To be determined.  SNF recommended by physical therapy.  Status is: Inpatient Remains inpatient appropriate because: Acute pulmonary embolism, atrial fibrillation with RVR    Medications: Scheduled:  atorvastatin  20 mg Oral Daily   Chlorhexidine Gluconate Cloth  6 each Topical Daily   vitamin B-12  500 mcg Oral Daily   memantine  5 mg Oral BID   metoprolol tartrate  12.5 mg Oral BID   multivitamin with minerals  1 tablet Oral Daily   tamsulosin  0.4 mg Oral Daily   Continuous:  sodium chloride     KMQ:KMMNOTRRNHAFB **OR** acetaminophen, LORazepam, magic mouthwash, ondansetron **OR** ondansetron (ZOFRAN)  IV, senna-docusate  Antibiotics: Anti-infectives (From admission, onward)    Start     Dose/Rate Route Frequency Ordered Stop   04/05/22 2000  ceFEPIme (MAXIPIME) 2 g in sodium chloride 0.9 % 100 mL IVPB  Status:  Discontinued        2 g 200 mL/hr over 30 Minutes Intravenous Every 24 hours 04/04/22 2145 04/05/22 0915   04/04/22 2145  vancomycin variable dose per unstable renal function (pharmacist dosing)  Status:  Discontinued         Does not apply See admin instructions 04/04/22 2145 04/05/22 0915   04/04/22 2000  ceFEPIme (MAXIPIME) 2 g in sodium chloride 0.9 % 100 mL IVPB        2 g 200 mL/hr over 30 Minutes Intravenous  Once 04/04/22 1946  04/04/22 2058   04/04/22 2000  metroNIDAZOLE (FLAGYL) IVPB 500 mg        500 mg 100 mL/hr over 60 Minutes Intravenous  Once 04/04/22 1946 04/04/22 2249   04/04/22 2000  vancomycin (VANCOCIN) IVPB 1000 mg/200 mL premix  Status:  Discontinued        1,000 mg 200 mL/hr over 60 Minutes Intravenous  Once 04/04/22 1946 04/04/22 1949   04/04/22 2000  vancomycin (VANCOREADY) IVPB 1250 mg/250 mL        1,250 mg 166.7 mL/hr over 90 Minutes Intravenous  Once 04/04/22 1949 04/04/22 2250       Objective:  Vital Signs  Vitals:   04/07/22 2300 04/08/22 0300 04/08/22 0529 04/08/22 0700  BP:  (!) 121/54  (!) 118/56  Pulse:  93 100 79  Resp:  '19 16 12  '$ Temp: 98.2 F (36.8 C) 99.9 F (37.7 C) 98.4 F (36.9 C) 98.1 F (36.7 C)  TempSrc: Axillary Axillary Oral Axillary  SpO2:  99% 100% 98%  Weight:      Height:        Intake/Output Summary (Last 24 hours) at 04/08/2022 1009 Last data filed at 04/08/2022 0900 Gross per 24 hour  Intake 933.94 ml  Output 800 ml  Net 133.94 ml    Filed Weights   04/04/22 2142  Weight: 67.1 kg    General appearance: Lethargic but easily arousable.  In no distress Resp: Clear to auscultation bilaterally.  Normal effort Cardio: S1-S2 is normal regular.  No S3-S4.  No rubs murmurs or bruit GI: Abdomen is soft.  Nontender nondistended.  Bowel sounds are present normal.  No masses organomegaly Foley catheter with bloody urine Extremities: No edema.   Disoriented.  No obvious focal neurological deficits.  Lab Results:  Data Reviewed: I have personally reviewed following labs and reports of the imaging studies  CBC: Recent Labs  Lab 04/04/22 1946 04/05/22 0221 04/06/22 0050 04/07/22 0241 04/07/22 0712 04/07/22 1358 04/07/22 1956 04/08/22 0250  WBC 8.0 8.5 6.4 5.3  --  4.8  --  6.9  NEUTROABS 5.0  --   --   --   --   --   --   --   HGB 9.0* 8.4* 8.0* 7.3* 7.1* 6.9* 8.5* 8.9*  HCT 28.1* 25.6* 23.8* 21.8* 22.1* 21.7* 25.2* 26.5*  MCV 115.2* 112.8*  111.2* 109.5*  --  112.4*  --  104.7*  PLT 174 172 166 178  --  187  --  197     Basic Metabolic Panel: Recent Labs  Lab 04/04/22 1946 04/05/22 0221 04/05/22 1530 04/06/22 0050 04/07/22 0241 04/08/22 0250  NA 138 137  --  136 135 137  K  4.5 4.3  --  4.4 3.9 4.2  CL 107 105  --  109 111 112*  CO2 25 23  --  23 19* 20*  GLUCOSE 113* 112*  --  97 99 94  BUN 60* 58*  --  56* 54* 50*  CREATININE 1.81* 1.68*  --  1.66* 1.47* 1.50*  CALCIUM 7.7* 7.4*  --  7.2* 7.0* 7.2*  MG  --   --  2.0 2.6*  --   --      GFR: Estimated Creatinine Clearance: 29.8 mL/min (A) (by C-G formula based on SCr of 1.5 mg/dL (H)).  Liver Function Tests: Recent Labs  Lab 04/04/22 1946  AST 38  ALT 19  ALKPHOS 96  BILITOT 1.0  PROT 5.2*  ALBUMIN 1.7*      Coagulation Profile: Recent Labs  Lab 04/04/22 1946  INR 1.3*     Anemia Panel: Recent Labs    04/07/22 1358  FERRITIN 372*  TIBC 104*  IRON 62  RETICCTPCT 1.1     Recent Results (from the past 240 hour(s))  Urine Culture     Status: None   Collection Time: 04/04/22  7:46 PM   Specimen: In/Out Cath Urine  Result Value Ref Range Status   Specimen Description IN/OUT CATH URINE  Final   Special Requests NONE  Final   Culture   Final    NO GROWTH Performed at [facility], [place] [address], [place], [place] [address]    Report Status 04/06/2022 FINAL  Final  Blood Culture (routine x 2)     Status: None (Preliminary result)   Collection Time: 04/04/22  8:00 PM   Specimen: BLOOD  Result Value Ref Range Status   Specimen Description BLOOD SITE NOT SPECIFIED  Final   Special Requests   Final    BOTTLES DRAWN AEROBIC AND ANAEROBIC Blood Culture adequate volume   Culture   Final    NO GROWTH 4 DAYS Performed at [facility], [place] [address] [place], [place] [address]    Report Status PENDING  Incomplete  Resp panel by RT-PCR (RSV, Flu A&B, Covid) Anterior Nasal Swab     Status: None   Collection Time: 04/04/22   8:26 PM   Specimen: Anterior Nasal Swab  Result Value Ref Range Status   SARS Coronavirus 2 by RT PCR NEGATIVE NEGATIVE Final    Comment: (NOTE) SARS-CoV-2 target nucleic acids are NOT DETECTED.  The SARS-CoV-2 RNA is generally detectable in upper respiratory specimens during the acute phase of infection. The lowest concentration of SARS-CoV-2 viral copies this assay can detect is 138 copies/mL. A negative result does not preclude SARS-Cov-2 infection and should not be used as the sole basis for treatment or other patient management decisions. A negative result may occur with  improper specimen collection/handling, submission of specimen other than nasopharyngeal swab, presence of viral mutation(s) within the areas targeted by this assay, and inadequate number of viral copies(<138 copies/mL). A negative result must be combined with clinical observations, patient history, and epidemiological information. The expected result is Negative.  Fact Sheet for Patients:  EntrepreneurPulse.com.au  Fact Sheet for Healthcare Providers:  IncredibleEmployment.be  This test is no t yet approved or cleared by the [place] FDA and  has been authorized for detection and/or diagnosis of SARS-CoV-2 by FDA under an Emergency Use Authorization (EUA). This EUA will remain  in effect (meaning this test can be used) for the duration of the COVID-19 declaration under Section  564(b)(1) of the Act, 21 U.S.C.section 360bbb-3(b)(1), unless the authorization is terminated  or revoked sooner.       Influenza A by PCR NEGATIVE NEGATIVE Final   Influenza B by PCR NEGATIVE NEGATIVE Final    Comment: (NOTE) The Xpert Xpress SARS-CoV-2/FLU/RSV plus assay is intended as an aid in the diagnosis of influenza from Nasopharyngeal swab specimens and should not be used as a sole basis for treatment. Nasal washings and aspirates are unacceptable for Xpert Xpress  SARS-CoV-2/FLU/RSV testing.  Fact Sheet for Patients: EntrepreneurPulse.com.au  Fact Sheet for Healthcare Providers: IncredibleEmployment.be  This test is not yet approved or cleared by the [place] FDA and has been authorized for detection and/or diagnosis of SARS-CoV-2 by FDA under an Emergency Use Authorization (EUA). This EUA will remain in effect (meaning this test can be used) for the duration of the COVID-19 declaration under Section 564(b)(1) of the Act, 21 U.S.C. section 360bbb-3(b)(1), unless the authorization is terminated or revoked.     Resp Syncytial Virus by PCR NEGATIVE NEGATIVE Final    Comment: (NOTE) Fact Sheet for Patients: EntrepreneurPulse.com.au  Fact Sheet for Healthcare Providers: IncredibleEmployment.be  This test is not yet approved or cleared by the [place] FDA and has been authorized for detection and/or diagnosis of SARS-CoV-2 by FDA under an Emergency Use Authorization (EUA). This EUA will remain in effect (meaning this test can be used) for the duration of the COVID-19 declaration under Section 564(b)(1) of the Act, 21 U.S.C. section 360bbb-3(b)(1), unless the authorization is terminated or revoked.  Performed at [facility], [place] [address] [place], [place] [address]   Blood Culture (routine x 2)     Status: None (Preliminary result)   Collection Time: 04/04/22  8:26 PM   Specimen: BLOOD  Result Value Ref Range Status   Specimen Description BLOOD SITE NOT SPECIFIED  Final   Special Requests   Final    BOTTLES DRAWN AEROBIC AND ANAEROBIC Blood Culture adequate volume   Culture   Final    NO GROWTH 4 DAYS Performed at [facility], [address] [place], [place] [address]    Report Status PENDING  Incomplete      Radiology Studies: CT ABDOMEN PELVIS W CONTRAST  Result Date: 04/08/2022 CLINICAL DATA:  89 year old male with abdominal pain.  Recent barium swallow. Bilateral lower lobe pulmonary emboli on CTA chest 04/04/2022. EXAM: CT ABDOMEN AND PELVIS WITH CONTRAST TECHNIQUE: Multidetector CT imaging of the abdomen and pelvis was performed using the standard protocol following bolus administration of intravenous contrast. RADIATION DOSE REDUCTION: This exam was performed according to the departmental dose-optimization program which includes automated exposure control, adjustment of the mA and/or kV according to patient size and/or use of iterative reconstruction technique. CONTRAST:  85m OMNIPAQUE IOHEXOL 350 MG/ML SOLN COMPARISON:  CTA chest 04/04/2022. CT Abdomen and Pelvis 10/27/2004. report of lumbar radiographs 04/30/2016. FINDINGS: Lower chest: No pericardial effusion. Small combined layering and sub pulmonic pleural effusions at both lung bases. Fairly simple fluid density. Patchy visible lower lobe and right middle lobe airspace disease. Hepatobiliary: Cholelithiasis on series 3, image 22. No pericholecystic inflammation. Trace perihepatic free fluid. Otherwise negative liver. No bile duct enlargement. Pancreas: Partially atrophied. Spleen: Negative. Adrenals/Urinary Tract: Normal adrenal glands. Exophytic right renal upper pole cyst is chronic with simple fluid density and appears benign (no follow-up imaging recommended). Symmetric renal enhancement. Left nephrolithiasis is 3 mm. No hydronephrosis. Symmetric renal contrast excretion. No hydroureter. Bladder is decompressed by Foley catheter.  Stomach/Bowel: Nonspecific presacral stranding with only mild retained stool in the rectum which is nondilated. Decompressed sigmoid colon. Mild diverticulosis in the proximal sigmoid and distal descending colon. Barium contrast has reached the descending colon with mild streak artifact. Ascending barium also. No dilated small bowel. Bilateral pericolic gutter stranding which is nonspecific. Appendix was diminutive in 2006 and is not well evaluated  today due to regional barium contrast artifact. Terminal ileum appears decompressed. Stomach appears decompressed. Nonspecific 9 mm hyperdense material in the proximal duodenum might be retained barium, uncertain series 3, image 30. No free air. No discrete free fluid in the abdomen, but there is layering free fluid at the pelvic inlet over the decompressed bladder which has mildly complex fluid density. Vascular/Lymphatic: Aortoiliac calcified atherosclerosis. Juxtarenal abdominal aortic aneurysm is up to 4.9 cm diameter, versus 3.2 cm in 2006. This tapers distally. Major arterial structures in the abdomen and pelvis remain patent. Portal venous system is patent. No lymphadenopathy identified. Reproductive: Foley catheter partially visible in the urethra. Other: Streak artifact in the pelvis, no definite pelvic free fluid. Musculoskeletal: Widespread lower thoracic and lumbar compression fractures. In the lumbar spine only L5 is spared. Underlying generalized osteopenia. In 2018 compression fractures of T11, T12, L2, and L4 were described. Sacrum and SI joints appear intact. Bilateral hip arthroplasty with some streak artifact. Chronic appearing left pubic rami fractures. IMPRESSION: 1. Nonspecific small volume of mildly complex free fluid layering at the pelvic inlet. Retained barium contrast in colon limits evaluation of the bowel, including the appendix. But there is no evidence of bowel obstruction or perforation at this time. 2. Juxtarenal Abdominal Aortic Aneurysm, up to 4.9 cm. Recommend follow-up every 6 months and vascular consultation. Reference: J Am Coll Radiol 8144;81:856-314. Aortic Atherosclerosis (ICD10-I70.0). Aortic aneurysm NOS (ICD10-I71.9). 3. Generalized osteopenia with widespread lower thoracic and lumbar compression fractures. Fractures at L1 and L3 are age indeterminate. 4. Cholelithiasis and left nephrolithiasis. 5. Layering and sub pulmonic small pleural effusions at both lung bases. See  also recent Chest CTA. Electronically Signed   By: Genevie Ann M.D.   On: 04/08/2022 06:54       LOS: 4 days   [facility] Pager on [email or web address]  04/08/2022, 10:09 AM

## 2022-04-08 NOTE — Progress Notes (Signed)
Speech Language Pathology Treatment: Dysphagia  Patient Details Name: Tom Ortiz MRN: [id number] DOB: 27-Jan-1928 Today's Date: 04/08/2022 Time: 6333-5456 SLP Time Calculation (min) (ACUTE ONLY): 10 min  Assessment / Plan / Recommendation Clinical Impression  Pt is very lethargic today.  RN reports significant difficulty with PO intake and has been giving liquid by syringe.  Pt today with poor oral clearance of puree.  There was extremely delayed swallow with pt opening his mouth to show bolus was gone without pharyngeal swallow response palpated. Pt benefited from multiple very and tactile cues to swallow today. With small amount of nectar thick liquid by cup pt did not exhibit swallow response.  There was plosive action of lips with trace expulsion of liquid bolus.  SLP used suction to clear oral cavity.  Pt is unlikely to meet his nutritional needs orally.  Will downgrade to puree diet for ease of intake.  SLP will await decision from goals of care discussion before pursuing repeat instrumental assessment.  Recommend puree diet and nectar thick liquids.     HPI HPI: Mr. Klasen is a 89 year old Caucasian male who was brought into the emergency department after EMS was called due to history of weakness ongoing for about 2 weeks.  Found to be in rapid a-fib and to have acute pulmonary embolism. CXR and Chest CT 1/20 with "Patchy atelectasis or infiltrate at the left lung base." Head CT 1/20 with no acute findings. Pt with past medical history of dementia, hearing impairment, paroxysmal atrial fibrillation not on anticoagulation.      SLP Plan  Continue with current plan of care      Recommendations for follow up therapy are one component of a multi-disciplinary discharge planning process, led by the attending physician.  Recommendations may be updated based on patient status, additional functional criteria and insurance authorization.    Recommendations  Diet recommendations: Dysphagia  1 (puree);Nectar-thick liquid Liquids provided via: Cup;Teaspoon;Straw Medication Administration: Crushed with puree Supervision: Trained caregiver to feed patient Compensations: Slow rate;Small sips/bites (Verbal cues to swallow)                Oral Care Recommendations: Oral care BID Follow Up Recommendations: Follow physician's recommendations for discharge plan and follow up therapies Assistance recommended at discharge: Frequent or constant Supervision/Assistance SLP Visit Diagnosis: Dysphagia, oropharyngeal phase (R13.12) Plan: Continue with current plan of care           Celedonio Savage, [place], [place] Office: [phone] 04/08/2022, 12:44 PM

## 2022-04-08 NOTE — Progress Notes (Signed)
Rounding Note    Patient Name: MCKENNA BORUFF Date of Encounter: 04/08/2022  [place] Cardiologist: Shelva Majestic, MD   Subjective   No CP or dyspnea; mildly confused  Inpatient Medications    Scheduled Meds:  atorvastatin  20 mg Oral Daily   Chlorhexidine Gluconate Cloth  6 each Topical Daily   vitamin B-12  500 mcg Oral Daily   memantine  5 mg Oral BID   metoprolol tartrate  12.5 mg Oral BID   multivitamin with minerals  1 tablet Oral Daily   tamsulosin  0.4 mg Oral Daily   Continuous Infusions:  sodium chloride     PRN Meds: acetaminophen **OR** acetaminophen, LORazepam, magic mouthwash, ondansetron **OR** ondansetron (ZOFRAN) IV, senna-docusate   Vital Signs    Vitals:   04/07/22 2300 04/08/22 0300 04/08/22 0529 04/08/22 0700  BP:  (!) 121/54  (!) 118/56  Pulse:  93 100 79  Resp:  '19 16 12  '$ Temp: 98.2 F (36.8 C) 99.9 F (37.7 C) 98.4 F (36.9 C) 98.1 F (36.7 C)  TempSrc: Axillary Axillary Oral Axillary  SpO2:  99% 100% 98%  Weight:      Height:        Intake/Output Summary (Last 24 hours) at 04/08/2022 0847 Last data filed at 04/08/2022 6967 Gross per 24 hour  Intake 558.94 ml  Output 800 ml  Net -241.06 ml       04/04/2022    9:42 PM 12/03/2020   11:55 AM 07/24/2020   11:19 AM  Last 3 Weights  Weight (lbs) 147 lb 14.4 oz 143 lb 155 lb 9.6 oz  Weight (kg) 67.087 kg 64.864 kg 70.58 kg      Telemetry    Sinus with PACs and NSVT vs aberrancy - Personally Reviewed   Physical Exam   GEN: NAD, frail Neck: Supple, no JVD Cardiac: irregular Respiratory: CTA; no wheeze GI: Soft, NT/ND, no masses MS: trace to 1+ edema Neuro:  Confused  Labs    High Sensitivity Troponin:   Recent Labs  Lab 04/04/22 1946 04/04/22 2315  TROPONINIHS 215* 199*      Chemistry Recent Labs  Lab 04/04/22 1946 04/05/22 0221 04/05/22 1530 04/06/22 0050 04/07/22 0241 04/08/22 0250  NA 138   < >  --  136 135 137  K 4.5   < >  --  4.4 3.9  4.2  CL 107   < >  --  109 111 112*  CO2 25   < >  --  23 19* 20*  GLUCOSE 113*   < >  --  97 99 94  BUN 60*   < >  --  56* 54* 50*  CREATININE 1.81*   < >  --  1.66* 1.47* 1.50*  CALCIUM 7.7*   < >  --  7.2* 7.0* 7.2*  MG  --   --  2.0 2.6*  --   --   PROT 5.2*  --   --   --   --   --   ALBUMIN 1.7*  --   --   --   --   --   AST 38  --   --   --   --   --   ALT 19  --   --   --   --   --   ALKPHOS 96  --   --   --   --   --   BILITOT 1.0  --   --   --   --   --  GFRNONAA 35*   < >  --  38* 44* 43*  ANIONGAP 6   < >  --  4* 5 5   < > = values in this interval not displayed.      Hematology Recent Labs  Lab 04/07/22 0241 04/07/22 0712 04/07/22 1358 04/07/22 1956 04/08/22 0250  WBC 5.3  --  4.8  --  6.9  RBC 1.99*  --  1.93*  1.90*  --  2.53*  HGB 7.3*   < > 6.9* 8.5* 8.9*  HCT 21.8*   < > 21.7* 25.2* 26.5*  MCV 109.5*  --  112.4*  --  104.7*  MCH 36.7*  --  35.8*  --  35.2*  MCHC 33.5  --  31.8  --  33.6  RDW 15.0  --  15.0  --  17.4*  PLT 178  --  187  --  197   < > = values in this interval not displayed.    Thyroid  Recent Labs  Lab 04/05/22 0221  TSH 1.091     Radiology    CT ABDOMEN PELVIS W CONTRAST  Result Date: 04/08/2022 CLINICAL DATA:  89 year old male with abdominal pain. Recent barium swallow. Bilateral lower lobe pulmonary emboli on CTA chest 04/04/2022. EXAM: CT ABDOMEN AND PELVIS WITH CONTRAST TECHNIQUE: Multidetector CT imaging of the abdomen and pelvis was performed using the standard protocol following bolus administration of intravenous contrast. RADIATION DOSE REDUCTION: This exam was performed according to the departmental dose-optimization program which includes automated exposure control, adjustment of the mA and/or kV according to patient size and/or use of iterative reconstruction technique. CONTRAST:  9m OMNIPAQUE IOHEXOL 350 MG/ML SOLN COMPARISON:  CTA chest 04/04/2022. CT Abdomen and Pelvis 10/27/2004. report of lumbar radiographs  04/30/2016. FINDINGS: Lower chest: No pericardial effusion. Small combined layering and sub pulmonic pleural effusions at both lung bases. Fairly simple fluid density. Patchy visible lower lobe and right middle lobe airspace disease. Hepatobiliary: Cholelithiasis on series 3, image 22. No pericholecystic inflammation. Trace perihepatic free fluid. Otherwise negative liver. No bile duct enlargement. Pancreas: Partially atrophied. Spleen: Negative. Adrenals/Urinary Tract: Normal adrenal glands. Exophytic right renal upper pole cyst is chronic with simple fluid density and appears benign (no follow-up imaging recommended). Symmetric renal enhancement. Left nephrolithiasis is 3 mm. No hydronephrosis. Symmetric renal contrast excretion. No hydroureter. Bladder is decompressed by Foley catheter. Stomach/Bowel: Nonspecific presacral stranding with only mild retained stool in the rectum which is nondilated. Decompressed sigmoid colon. Mild diverticulosis in the proximal sigmoid and distal descending colon. Barium contrast has reached the descending colon with mild streak artifact. Ascending barium also. No dilated small bowel. Bilateral pericolic gutter stranding which is nonspecific. Appendix was diminutive in 2006 and is not well evaluated today due to regional barium contrast artifact. Terminal ileum appears decompressed. Stomach appears decompressed. Nonspecific 9 mm hyperdense material in the proximal duodenum might be retained barium, uncertain series 3, image 30. No free air. No discrete free fluid in the abdomen, but there is layering free fluid at the pelvic inlet over the decompressed bladder which has mildly complex fluid density. Vascular/Lymphatic: Aortoiliac calcified atherosclerosis. Juxtarenal abdominal aortic aneurysm is up to 4.9 cm diameter, versus 3.2 cm in 2006. This tapers distally. Major arterial structures in the abdomen and pelvis remain patent. Portal venous system is patent. No lymphadenopathy  identified. Reproductive: Foley catheter partially visible in the urethra. Other: Streak artifact in the pelvis, no definite pelvic free fluid. Musculoskeletal: Widespread lower thoracic and lumbar compression fractures.  In the lumbar spine only L5 is spared. Underlying generalized osteopenia. In 2018 compression fractures of T11, T12, L2, and L4 were described. Sacrum and SI joints appear intact. Bilateral hip arthroplasty with some streak artifact. Chronic appearing left pubic rami fractures. IMPRESSION: 1. Nonspecific small volume of mildly complex free fluid layering at the pelvic inlet. Retained barium contrast in colon limits evaluation of the bowel, including the appendix. But there is no evidence of bowel obstruction or perforation at this time. 2. Juxtarenal Abdominal Aortic Aneurysm, up to 4.9 cm. Recommend follow-up every 6 months and vascular consultation. Reference: J Am Coll Radiol 9242;68:341-962. Aortic Atherosclerosis (ICD10-I70.0). Aortic aneurysm NOS (ICD10-I71.9). 3. Generalized osteopenia with widespread lower thoracic and lumbar compression fractures. Fractures at L1 and L3 are age indeterminate. 4. Cholelithiasis and left nephrolithiasis. 5. Layering and sub pulmonic small pleural effusions at both lung bases. See also recent Chest CTA. Electronically Signed   By: Genevie Ann M.D.   On: 04/08/2022 06:54    Patient Profile     89 y.o. male with past medical history of dementia, paroxysmal atrial fibrillation, heart failure reduced EF, hypertension admitted with pulmonary embolus for evaluation of atrial fibrillation with rapid ventricular response.  CTA this admission shows bilateral lower lobe pulmonary emboli and coronary calcification.  Lower extremity venous Dopplers no DVT.  Echocardiogram showed ejection fraction 50 to 55%, mild left ventricular hypertrophy and mild aortic stenosis but mean gradient noted to be 7.4 mmHg.  Assessment & Plan    1 paroxysmal atrial fibrillation-patient  is in sinus rhythm.  Continue metoprolol.  2 acute pulmonary embolus- Presently on heparin.  Transition to apixaban for pulmonary embolus when okay with primary service.  Note I do not think he is a good long-term anticoagulation candidate after he completes therapy for his pulmonary embolus.  3 elevated troponin-minimal elevation with no clear trend.  Patient also without chest pain.  Not consistent with acute coronary syndrome.  4 history of heart failure reduced EF-patient mildly volume examination.  There may be a contribution from poor nutritional status/low albumin.  Will follow and diurese as needed.  5 history of coronary artery disease-patient denies chest pain.  Continue statin.  Would discontinue aspirin when apixaban is initiated.  6 acute kidney injury-Per primary service.  7 macrocytic anemia further evaluation per primary care.  8 abdominal aortic aneurysm-noted on abdominal CT.  Patient not a candidate for further intervention.  Cardiology will sign off.  Will continue metoprolol at discharge.  Please call with questions.  For questions or updates, please contact [place] Please consult [email or web address] for contact info under        Signed, Kirk Ruths, MD  04/08/2022, 8:47 AM

## 2022-04-09 DIAGNOSIS — N179 Acute kidney failure, unspecified: Secondary | ICD-10-CM | POA: Diagnosis not present

## 2022-04-09 DIAGNOSIS — I2699 Other pulmonary embolism without acute cor pulmonale: Secondary | ICD-10-CM | POA: Diagnosis not present

## 2022-04-09 DIAGNOSIS — F0390 Unspecified dementia without behavioral disturbance: Secondary | ICD-10-CM | POA: Diagnosis not present

## 2022-04-09 DIAGNOSIS — J9601 Acute respiratory failure with hypoxia: Secondary | ICD-10-CM

## 2022-04-09 DIAGNOSIS — I482 Chronic atrial fibrillation, unspecified: Secondary | ICD-10-CM | POA: Diagnosis not present

## 2022-04-09 DIAGNOSIS — R319 Hematuria, unspecified: Secondary | ICD-10-CM | POA: Diagnosis not present

## 2022-04-09 DIAGNOSIS — G9341 Metabolic encephalopathy: Secondary | ICD-10-CM

## 2022-04-09 LAB — CBC
HCT: 26.5 % — ABNORMAL LOW (ref 39.0–52.0)
Hemoglobin: 8.7 g/dL — ABNORMAL LOW (ref 13.0–17.0)
MCH: 34.9 pg — ABNORMAL HIGH (ref 26.0–34.0)
MCHC: 32.8 g/dL (ref 30.0–36.0)
MCV: 106.4 fL — ABNORMAL HIGH (ref 80.0–100.0)
Platelets: 190 10*3/uL (ref 150–400)
RBC: 2.49 MIL/uL — ABNORMAL LOW (ref 4.22–5.81)
RDW: 17.1 % — ABNORMAL HIGH (ref 11.5–15.5)
WBC: 5.4 10*3/uL (ref 4.0–10.5)
nRBC: 0 % (ref 0.0–0.2)

## 2022-04-09 LAB — CULTURE, BLOOD (ROUTINE X 2)
Culture: NO GROWTH
Culture: NO GROWTH
Special Requests: ADEQUATE
Special Requests: ADEQUATE

## 2022-04-09 LAB — BASIC METABOLIC PANEL
Anion gap: 3 — ABNORMAL LOW (ref 5–15)
BUN: 45 mg/dL — ABNORMAL HIGH (ref 8–23)
CO2: 22 mmol/L (ref 22–32)
Calcium: 7.2 mg/dL — ABNORMAL LOW (ref 8.9–10.3)
Chloride: 113 mmol/L — ABNORMAL HIGH (ref 98–111)
Creatinine, Ser: 1.25 mg/dL — ABNORMAL HIGH (ref 0.61–1.24)
GFR, Estimated: 54 mL/min — ABNORMAL LOW (ref >60)
Glucose, Bld: 107 mg/dL — ABNORMAL HIGH (ref 70–99)
Potassium: 4.3 mmol/L (ref 3.5–5.1)
Sodium: 138 mmol/L (ref 135–145)

## 2022-04-09 MED ORDER — GLYCOPYRROLATE 1 MG PO TABS
1.0000 mg | ORAL_TABLET | ORAL | Status: DC | PRN
  Filled 2022-04-09: qty 1

## 2022-04-09 MED ORDER — GLYCOPYRROLATE 0.2 MG/ML IJ SOLN: 0.2000 mg | INTRAMUSCULAR | Status: DC | PRN

## 2022-04-09 MED ORDER — POLYVINYL ALCOHOL 1.4 % OP SOLN: 1.0000 [drp] | Freq: Four times a day (QID) | OPHTHALMIC | Status: DC | PRN

## 2022-04-09 MED ORDER — MORPHINE SULFATE (PF) 2 MG/ML IV SOLN
1.0000 mg | INTRAVENOUS | Status: DC | PRN
  Administered 2022-04-10: 1 mg via INTRAVENOUS
  Administered 2022-04-10 – 2022-04-13 (×2): 2 mg via INTRAVENOUS
  Filled 2022-04-09 (×3): qty 1

## 2022-04-09 MED ORDER — BIOTENE DRY MOUTH MT LIQD: 15.0000 mL | OROMUCOSAL | Status: DC | PRN

## 2022-04-09 MED ORDER — HALOPERIDOL 1 MG PO TABS
0.5000 mg | ORAL_TABLET | ORAL | Status: DC | PRN
  Filled 2022-04-09: qty 1

## 2022-04-09 MED ORDER — LORAZEPAM 2 MG/ML PO CONC: 1.0000 mg | ORAL | Status: DC | PRN

## 2022-04-09 MED ORDER — HALOPERIDOL LACTATE 5 MG/ML IJ SOLN: 0.5000 mg | INTRAMUSCULAR | Status: DC | PRN

## 2022-04-09 MED ORDER — LORAZEPAM 1 MG PO TABS
1.0000 mg | ORAL_TABLET | ORAL | Status: DC | PRN
  Administered 2022-04-10: 1 mg via ORAL
  Filled 2022-04-09: qty 1

## 2022-04-09 MED ORDER — HALOPERIDOL LACTATE 2 MG/ML PO CONC
0.5000 mg | ORAL | Status: DC | PRN
  Filled 2022-04-09: qty 5

## 2022-04-09 NOTE — Progress Notes (Signed)
PT Cancellation Note  Patient Details Name: Tom Ortiz MRN: [id number] DOB: 22-Aug-1927   Cancelled Treatment:    Reason Eval/Treat Not Completed: Medical issues which prohibited therapy; noted MD request therapy sign off due to possible plan to transition to comfort.  Please re-consult if further needs arise.   Reginia Naas 04/09/2022, 8:54 AM Magda Kiel, PT Acute Rehabilitation Services Office:[phone] 04/09/2022

## 2022-04-09 NOTE — Progress Notes (Signed)
OT Cancellation Note  Patient Details Name: TREQUAN MARSOLEK MRN: [id number] DOB: [birth year]   Cancelled Treatment:    Reason Eval/Treat Not Completed: Other (comment), discussed with MD who requests OT to sign off at this time with possible transition to comfort care.  If further needs arise, please re-consult.   Jolaine Artist, OT Acute Rehabilitation Services Office [phone]   Delight Stare 04/09/2022, 8:43 AM

## 2022-04-09 NOTE — Progress Notes (Signed)
[facility] PROGRESS NOTE   Tom Ortiz QIO:[id number] DOB: 1927/12/13 DOA: 04/04/2022  PCP: Pcp, No  Brief History/Interval Summary: 89 year old Caucasian male with past medical history of dementia, hearing impairment, paroxysmal atrial fibrillation not on anticoagulation who was brought into the emergency department after EMS was called due to history of weakness ongoing for about 2 weeks.  He was noted to be hypotensive and tachycardic when EMS evaluated him.  He was brought into the hospital for further management.  Found to be in rapid atrial fibrillation.  Blood pressure improved after fluid resuscitation.  He was found to have acute pulmonary embolism.  He was hospitalized for further management.  Consultants: Cardiology  Procedures: Echocardiogram    Subjective/Interval History: Patient noted to be lethargic but arousable.  Had very poor oral intake yesterday.  Continues to have some blood in the urine.      Assessment/Plan:  Atrial fibrillation with RVR with a known history of chronic atrial fibrillation Came in with RVR which improved after he hydration.  He was found to have pulmonary embolism which could have been the inciting event. TSH 1.09.   Cardiology was consulted.  Previously not on anticoagulation due to age, anemia, dementia, frail status. Had a few episodes of RVR.  There was also some concern for ventricular tachycardia but it was likely atrial fibrillation with aberrancy.  He was given a dose of amiodarone.  He converted to sinus rhythm. Continue with oral metoprolol.  Hematuria Patient pulled his Foley catheter out on 1/22.  Developed hematuria.  Heparin was stopped.  Coude catheter had to be placed.  Continue with irrigations.  Drop in hemoglobin noted for which he was transfused as mentioned below.  Hemoglobin is stable.  Continues to have some blood in the urine. Continue Foley catheter for now.  Macrocytic anemia/acute blood loss  anemia Baseline hemoglobin seems to be around 10-11.  Came in with hemoglobin of 9.   Drop in hemoglobin partly dilutional and partly due to hematuria. Hemoglobin dropped to 6.9.  He was given 1 unit of PRBC.  Hemoglobin responded.  Noted to be stable this morning at 8.7.  Folic acid level 32.4.  Vitamin B12 306.  Continue B12 supplementation. He is not a candidate for further workup of anemia due to his comorbidities and dementia.  Acute pulmonary embolism/acute respiratory failure with hypoxia CT angiogram showed pulmonary embolism. Patient was started on heparin. No DVT noted on lower extremity Doppler studies. Echocardiogram shows LVEF to be 50 to 55%.  Right ventricular function was noted to be normal.  No significant valvular abnormalities noted.  Mild AS was noted. Patient was also empirically started on antibiotics at the time of admission but no infection identified.  Antibiotics were discontinued. Patient had significant hematuria after he pulled his Foley out.  Heparin had to be discontinued.  Continues to have bleeding through his catheter. This is a difficult situation. Due to his dementia and other comorbidities he is not a candidate for any kind of invasive workup. Even invasive interventions such as IVC filter may not be appropriate. Palliative care is following.  Stable from a respiratory standpoint.  Continue to hold anticoagulation.  Hypotension, unspecified Could have been due to hypovolemia as he did have acute kidney injury.  Perhaps has had poor oral intake since he has been weak for 2 weeks.  Blood pressure improved with fluid resuscitation Lactic acid levels improved. Blood pressure is stable.  Acute kidney injury As of 2022 is baseline creatinine  was around 1.19.   Came in with creatinine of 1.81.  Thought to be due to hypovolemia.  Patient was given IV fluids.  Creatinine is stable for the most part.    Unspecified abdominal pain, chronic [name] mentions  longstanding abdominal pain.  He is wonders if this could be the reason for patient's poor appetite. CT scan of the abdomen pelvis was done which showed nonspecific findings.  Abdominal aneurysm was noted at 4.9 cm.  Cholelithiasis and nephrolithiasis was noted.  LFTs were noted to be normal.  No other acute findings noted on CT scan.  His abdomen is benign.  Do not anticipate any further workup.  Patient seems to be asymptomatic for the most part.  His oral intake is poor but he does not have any nausea or vomiting.    Chronic systolic CHF EF based on echo from 2022 is 30 to 35%.  Regional wall motion abnormalities and global hypokinesis was noted at that time. Echocardiogram done during this admission showed improved EF at 50 to 55%. Due to his cognitive deficits/dementia not a candidate for any aggressive cardiac interventions. Mildly elevated troponin levels are secondary to PE, tachyarrhythmia and hypotension.  Unlikely to be ACS.  History of dementia with some delirium Continue Namenda.  Seizure disorder Informed that patient was not on keppra.  Keppra was discontinued.    History of closed intertrochanteric fracture of the left hip with delayed healing Has seen orthopedics previously.  Last surgery was 10 years ago.  Up until 2 weeks ago patient was ambulatory in the house using a walker.  Goals of care Have discussed with patient's son over the last few days.  Patient's CODE STATUS was changed over to DNR/DNI.  Patient with complicated health issues requiring extensive decision making on the part of patient's son.   Palliative care was consulted.   In summary this is a 89 year old male with dementia and other comorbidities who comes in with several acute medical issues.  Patient has had poor quality of life for several months and especially in the last few weeks.  In view of all of this, aggressive care including even anticoagulation is probably not indicated.  Now with hematuria which  further limits treatment options in the acute setting.  Patient appears to be more lethargic over the last 24 hours.  His oral intake has been very poor.  Will discuss with patient's son.  Transition to hospice/comfort care seems reasonable.   Discussed with patient's son this morning.  He agrees with transition to comfort care.  Disposition not clear yet.  May need to consider residential hospice depending on patient's progress in the next 24 to 48 hours.  DVT Prophylaxis: IV heparin is currently on hold. Code Status: DNR Family Communication: No family at bedside.  Son being updated on a daily basis. Disposition Plan: To be determined.  SNF recommended by physical therapy.  Status is: Inpatient Remains inpatient appropriate because: Acute pulmonary embolism, atrial fibrillation with RVR    Medications: Scheduled:  atorvastatin  20 mg Oral Daily   Chlorhexidine Gluconate Cloth  6 each Topical Daily   vitamin B-12  500 mcg Oral Daily   memantine  5 mg Oral BID   metoprolol tartrate  12.5 mg Oral BID   multivitamin with minerals  1 tablet Oral Daily   tamsulosin  0.4 mg Oral Daily   Continuous:  sodium chloride     ERX:[id number] **OR** acetaminophen, LORazepam, magic mouthwash, ondansetron **OR** ondansetron (ZOFRAN) IV, senna-docusate  Antibiotics: Anti-infectives (From admission, onward)    Start     Dose/Rate Route Frequency Ordered Stop   04/05/22 2000  ceFEPIme (MAXIPIME) 2 g in sodium chloride 0.9 % 100 mL IVPB  Status:  Discontinued        2 g 200 mL/hr over 30 Minutes Intravenous Every 24 hours 04/04/22 2145 04/05/22 0915   04/04/22 2145  vancomycin variable dose per unstable renal function (pharmacist dosing)  Status:  Discontinued         Does not apply See admin instructions 04/04/22 2145 04/05/22 0915   04/04/22 2000  ceFEPIme (MAXIPIME) 2 g in sodium chloride 0.9 % 100 mL IVPB        2 g 200 mL/hr over 30 Minutes Intravenous  Once 04/04/22 1946 04/04/22 2058    04/04/22 2000  metroNIDAZOLE (FLAGYL) IVPB 500 mg        500 mg 100 mL/hr over 60 Minutes Intravenous  Once 04/04/22 1946 04/04/22 2249   04/04/22 2000  vancomycin (VANCOCIN) IVPB 1000 mg/200 mL premix  Status:  Discontinued        1,000 mg 200 mL/hr over 60 Minutes Intravenous  Once 04/04/22 1946 04/04/22 1949   04/04/22 2000  vancomycin (VANCOREADY) IVPB 1250 mg/250 mL        1,250 mg 166.7 mL/hr over 90 Minutes Intravenous  Once 04/04/22 1949 04/04/22 2250       Objective:  Vital Signs  Vitals:   04/08/22 1900 04/08/22 2300 04/09/22 0300 04/09/22 0803  BP: 114/62 (!) 142/70 137/65 (!) 125/58  Pulse: 86 92 86 86  Resp: '20 20 20 '$ (!) 21  Temp: 98.5 F (36.9 C) 98.2 F (36.8 C)  99.2 F (37.3 C)  TempSrc: Axillary Axillary Oral Oral  SpO2: 100% 100% 100% 99%  Weight:      Height:        Intake/Output Summary (Last 24 hours) at 04/09/2022 1005 Last data filed at 04/09/2022 0932 Gross per 24 hour  Intake 863 ml  Output 750 ml  Net 113 ml    Filed Weights   04/04/22 2142  Weight: 67.1 kg    General appearance: Lethargic but hospital.  Does not appear to be in any discomfort or distress. Resp: Clear to auscultation bilaterally.  Normal effort Cardio: S1-S2 is normal regular.  No S3-S4.  No rubs murmurs or bruit GI: Abdomen is soft.  Nontender nondistended.  Bowel sounds are present normal.  No masses organomegaly Foley catheter noted with bloody urine. Extremities: No edema.    Lab Results:  Data Reviewed: I have personally reviewed following labs and reports of the imaging studies  CBC: Recent Labs  Lab 04/04/22 1946 04/05/22 0221 04/06/22 0050 04/07/22 0241 04/07/22 0712 04/07/22 1358 04/07/22 1956 04/08/22 0250 04/09/22 0204  WBC 8.0   < > 6.4 5.3  --  4.8  --  6.9 5.4  NEUTROABS 5.0  --   --   --   --   --   --   --   --   HGB 9.0*   < > 8.0* 7.3* 7.1* 6.9* 8.5* 8.9* 8.7*  HCT 28.1*   < > 23.8* 21.8* 22.1* 21.7* 25.2* 26.5* 26.5*  MCV 115.2*   < >  111.2* 109.5*  --  112.4*  --  104.7* 106.4*  PLT 174   < > 166 178  --  187  --  197 190   < > = values in this interval not displayed.  Basic Metabolic Panel: Recent Labs  Lab 04/05/22 0221 04/05/22 1530 04/06/22 0050 04/07/22 0241 04/08/22 0250 04/09/22 0204  NA 137  --  136 135 137 138  K 4.3  --  4.4 3.9 4.2 4.3  CL 105  --  109 111 112* 113*  CO2 23  --  23 19* 20* 22  GLUCOSE 112*  --  97 99 94 107*  BUN 58*  --  56* 54* 50* 45*  CREATININE 1.68*  --  1.66* 1.47* 1.50* 1.25*  CALCIUM 7.4*  --  7.2* 7.0* 7.2* 7.2*  MG  --  2.0 2.6*  --   --   --      GFR: Estimated Creatinine Clearance: 35.8 mL/min (A) (by C-G formula based on SCr of 1.25 mg/dL (H)).  Liver Function Tests: Recent Labs  Lab 04/04/22 1946  AST 38  ALT 19  ALKPHOS 96  BILITOT 1.0  PROT 5.2*  ALBUMIN 1.7*      Coagulation Profile: Recent Labs  Lab 04/04/22 1946  INR 1.3*     Anemia Panel: Recent Labs    04/07/22 1358  FERRITIN 372*  TIBC 104*  IRON 62  RETICCTPCT 1.1     Recent Results (from the past 240 hour(s))  Urine Culture     Status: None   Collection Time: 04/04/22  7:46 PM   Specimen: In/Out Cath Urine  Result Value Ref Range Status   Specimen Description IN/OUT CATH URINE  Final   Special Requests NONE  Final   Culture   Final    NO GROWTH Performed at [facility], [place] [address], [place], [place] [address]    Report Status 04/06/2022 FINAL  Final  Blood Culture (routine x 2)     Status: None   Collection Time: 04/04/22  8:00 PM   Specimen: BLOOD  Result Value Ref Range Status   Specimen Description BLOOD SITE NOT SPECIFIED  Final   Special Requests   Final    BOTTLES DRAWN AEROBIC AND ANAEROBIC Blood Culture adequate volume   Culture   Final    NO GROWTH 5 DAYS Performed at [facility], [place] [address], [place], [place] [address]    Report Status 04/09/2022 FINAL  Final  Resp panel by RT-PCR (RSV, Flu A&B, Covid) Anterior Nasal Swab      Status: None   Collection Time: 04/04/22  8:26 PM   Specimen: Anterior Nasal Swab  Result Value Ref Range Status   SARS Coronavirus 2 by RT PCR NEGATIVE NEGATIVE Final    Comment: (NOTE) SARS-CoV-2 target nucleic acids are NOT DETECTED.  The SARS-CoV-2 RNA is generally detectable in upper respiratory specimens during the acute phase of infection. The lowest concentration of SARS-CoV-2 viral copies this assay can detect is 138 copies/mL. A negative result does not preclude SARS-Cov-2 infection and should not be used as the sole basis for treatment or other patient management decisions. A negative result may occur with  improper specimen collection/handling, submission of specimen other than nasopharyngeal swab, presence of viral mutation(s) within the areas targeted by this assay, and inadequate number of viral copies(<138 copies/mL). A negative result must be combined with clinical observations, patient history, and epidemiological information. The expected result is Negative.  Fact Sheet for Patients:  EntrepreneurPulse.com.au  Fact Sheet for Healthcare Providers:  IncredibleEmployment.be  This test is no t yet approved or cleared by the [place] FDA and  has been authorized for detection and/or diagnosis of SARS-CoV-2  by FDA under an Emergency Use Authorization (EUA). This EUA will remain  in effect (meaning this test can be used) for the duration of the COVID-19 declaration under Section 564(b)(1) of the Act, 21 U.S.C.section 360bbb-3(b)(1), unless the authorization is terminated  or revoked sooner.       Influenza A by PCR NEGATIVE NEGATIVE Final   Influenza B by PCR NEGATIVE NEGATIVE Final    Comment: (NOTE) The Xpert Xpress SARS-CoV-2/FLU/RSV plus assay is intended as an aid in the diagnosis of influenza from Nasopharyngeal swab specimens and should not be used as a sole basis for treatment. Nasal washings and aspirates are  unacceptable for Xpert Xpress SARS-CoV-2/FLU/RSV testing.  Fact Sheet for Patients: EntrepreneurPulse.com.au  Fact Sheet for Healthcare Providers: IncredibleEmployment.be  This test is not yet approved or cleared by the [place] FDA and has been authorized for detection and/or diagnosis of SARS-CoV-2 by FDA under an Emergency Use Authorization (EUA). This EUA will remain in effect (meaning this test can be used) for the duration of the COVID-19 declaration under Section 564(b)(1) of the Act, 21 U.S.C. section 360bbb-3(b)(1), unless the authorization is terminated or revoked.     Resp Syncytial Virus by PCR NEGATIVE NEGATIVE Final    Comment: (NOTE) Fact Sheet for Patients: EntrepreneurPulse.com.au  Fact Sheet for Healthcare Providers: IncredibleEmployment.be  This test is not yet approved or cleared by the [place] FDA and has been authorized for detection and/or diagnosis of SARS-CoV-2 by FDA under an Emergency Use Authorization (EUA). This EUA will remain in effect (meaning this test can be used) for the duration of the COVID-19 declaration under Section 564(b)(1) of the Act, 21 U.S.C. section 360bbb-3(b)(1), unless the authorization is terminated or revoked.  Performed at [facility], [place] [address] [place], [place] [address]   Blood Culture (routine x 2)     Status: None   Collection Time: 04/04/22  8:26 PM   Specimen: BLOOD  Result Value Ref Range Status   Specimen Description BLOOD SITE NOT SPECIFIED  Final   Special Requests   Final    BOTTLES DRAWN AEROBIC AND ANAEROBIC Blood Culture adequate volume   Culture   Final    NO GROWTH 5 DAYS Performed at [facility], [address], [place], [place] [address]    Report Status 04/09/2022 FINAL  Final      Radiology Studies: CT ABDOMEN PELVIS W CONTRAST  Result Date: 04/08/2022 CLINICAL DATA:  89 year old male with  abdominal pain. Recent barium swallow. Bilateral lower lobe pulmonary emboli on CTA chest 04/04/2022. EXAM: CT ABDOMEN AND PELVIS WITH CONTRAST TECHNIQUE: Multidetector CT imaging of the abdomen and pelvis was performed using the standard protocol following bolus administration of intravenous contrast. RADIATION DOSE REDUCTION: This exam was performed according to the departmental dose-optimization program which includes automated exposure control, adjustment of the mA and/or kV according to patient size and/or use of iterative reconstruction technique. CONTRAST:  41m OMNIPAQUE IOHEXOL 350 MG/ML SOLN COMPARISON:  CTA chest 04/04/2022. CT Abdomen and Pelvis 10/27/2004. report of lumbar radiographs 04/30/2016. FINDINGS: Lower chest: No pericardial effusion. Small combined layering and sub pulmonic pleural effusions at both lung bases. Fairly simple fluid density. Patchy visible lower lobe and right middle lobe airspace disease. Hepatobiliary: Cholelithiasis on series 3, image 22. No pericholecystic inflammation. Trace perihepatic free fluid. Otherwise negative liver. No bile duct enlargement. Pancreas: Partially atrophied. Spleen: Negative. Adrenals/Urinary Tract: Normal adrenal glands. Exophytic right renal upper pole cyst is chronic with simple fluid density  and appears benign (no follow-up imaging recommended). Symmetric renal enhancement. Left nephrolithiasis is 3 mm. No hydronephrosis. Symmetric renal contrast excretion. No hydroureter. Bladder is decompressed by Foley catheter. Stomach/Bowel: Nonspecific presacral stranding with only mild retained stool in the rectum which is nondilated. Decompressed sigmoid colon. Mild diverticulosis in the proximal sigmoid and distal descending colon. Barium contrast has reached the descending colon with mild streak artifact. Ascending barium also. No dilated small bowel. Bilateral pericolic gutter stranding which is nonspecific. Appendix was diminutive in 2006 and is not  well evaluated today due to regional barium contrast artifact. Terminal ileum appears decompressed. Stomach appears decompressed. Nonspecific 9 mm hyperdense material in the proximal duodenum might be retained barium, uncertain series 3, image 30. No free air. No discrete free fluid in the abdomen, but there is layering free fluid at the pelvic inlet over the decompressed bladder which has mildly complex fluid density. Vascular/Lymphatic: Aortoiliac calcified atherosclerosis. Juxtarenal abdominal aortic aneurysm is up to 4.9 cm diameter, versus 3.2 cm in 2006. This tapers distally. Major arterial structures in the abdomen and pelvis remain patent. Portal venous system is patent. No lymphadenopathy identified. Reproductive: Foley catheter partially visible in the urethra. Other: Streak artifact in the pelvis, no definite pelvic free fluid. Musculoskeletal: Widespread lower thoracic and lumbar compression fractures. In the lumbar spine only L5 is spared. Underlying generalized osteopenia. In 2018 compression fractures of T11, T12, L2, and L4 were described. Sacrum and SI joints appear intact. Bilateral hip arthroplasty with some streak artifact. Chronic appearing left pubic rami fractures. IMPRESSION: 1. Nonspecific small volume of mildly complex free fluid layering at the pelvic inlet. Retained barium contrast in colon limits evaluation of the bowel, including the appendix. But there is no evidence of bowel obstruction or perforation at this time. 2. Juxtarenal Abdominal Aortic Aneurysm, up to 4.9 cm. Recommend follow-up every 6 months and vascular consultation. Reference: J Am Coll Radiol 1771;16:579-038. Aortic Atherosclerosis (ICD10-I70.0). Aortic aneurysm NOS (ICD10-I71.9). 3. Generalized osteopenia with widespread lower thoracic and lumbar compression fractures. Fractures at L1 and L3 are age indeterminate. 4. Cholelithiasis and left nephrolithiasis. 5. Layering and sub pulmonic small pleural effusions at both  lung bases. See also recent Chest CTA. Electronically Signed   By: Genevie Ann M.D.   On: 04/08/2022 06:54       LOS: 5 days   [facility] Pager on [email or web address]  04/09/2022, 10:05 AM

## 2022-04-09 NOTE — Progress Notes (Signed)
Palliative Medicine Progress Note   Patient Name: Tom Ortiz       Date: 04/09/2022 DOB: 1927/12/07  Age: 89 y.o. MRN#: [id number] Attending Physician: Bonnielee Haff, MD Primary Care Physician: Kathyrn Lass Admit Date: 04/04/2022    HPI/Patient Profile: 89 y.o. male  with past medical history of dementia, hearing impairment, and paroxysmal atrial fibrillation not on anticoagulation who presented to [facility] ED on 04/04/2022 with weakness for about 2 weeks.  He was noted to be hypotensive and tachycardic when EMS evaluated him.  In the ED, he was found to be in atrial fibrillation with RVR.  He was also found to have a pulmonary embolism and was started on heparin.  Admitted to [facility] service.   Palliative Medicine was consulted for goals of care.  Subjective: Chart reviewed and update received from Dr. [place] Pink. He spoke with son earlier today and he was agreeable to comfort care.  Bedside visit. Patient is resting comfortably and does not open his eyes to voice.   I spoke with son Deral by phone. Confirmed goal of care is for comfort measures only. Reviewed comfort care as allowing a natural course occur without using interventions to prolong life.  Discussed comfort care provided in the hospital, and what that would look like--keeping him clean and dry, no labs, no artificial hydration or feeding, minimizing of medications, comfort feeds, and using medication to manage symptoms such as pain or agitation. Provided education on the natural trajectory at end-of-life.  Emotional support provided.   Objective:  Physical Exam Vitals reviewed.  Constitutional:      General: He is sleeping. He is in acute distress.     Comments: Frail and chronically ill-appearing  Pulmonary:     Effort: Pulmonary  effort is normal.  Psychiatric:        Cognition and Memory: Cognition is impaired.              Palliative Medicine Assessment & Plan   Assessment: Principal Problem:   Chronic atrial fibrillation with RVR (HCC) Active Problems:   HTN (hypertension)   Hyperlipidemia   Acute pulmonary embolism (HCC)   Acute respiratory failure with hypoxia (HCC)   Lactic acid acidosis   AKI (acute kidney injury) (HCC)   Elevated troponin   Acute metabolic encephalopathy   CAD (coronary artery disease)  Protein-calorie malnutrition, severe    Recommendations/Plan: Full comfort measures Son declines transfer to residential hospice at this time PMT will continue to follow  Symptom Management:  Morphine prn for pain or dyspnea Lorazepam (ATIVAN) prn for anxiety Haloperidol (HALDOL) prn for agitation  Glycopyrrolate (ROBINUL) for excessive secretions Ondansetron (ZOFRAN) prn for nausea Polyvinyl alcohol (LIQUIFILM TEARS) prn for dry eyes Antiseptic oral rinse (BIOTENE) prn for dry mouth   Code Status: DNR  Prognosis:  2-4 weeks  Discharge Planning: To Be Determined   Thank you for allowing the Palliative Medicine Team to assist in the care of this patient.    Lavena Bullion, NP   Please contact Palliative Medicine Team phone at [phone] for questions and concerns.  For individual providers, please see AMION.

## 2022-04-10 DIAGNOSIS — G9341 Metabolic encephalopathy: Secondary | ICD-10-CM | POA: Diagnosis not present

## 2022-04-10 DIAGNOSIS — J9601 Acute respiratory failure with hypoxia: Secondary | ICD-10-CM | POA: Diagnosis not present

## 2022-04-10 DIAGNOSIS — E43 Unspecified severe protein-calorie malnutrition: Secondary | ICD-10-CM | POA: Diagnosis not present

## 2022-04-10 DIAGNOSIS — I2699 Other pulmonary embolism without acute cor pulmonale: Secondary | ICD-10-CM | POA: Diagnosis not present

## 2022-04-10 DIAGNOSIS — I482 Chronic atrial fibrillation, unspecified: Secondary | ICD-10-CM | POA: Diagnosis not present

## 2022-04-10 NOTE — Progress Notes (Signed)
Patient currently not in restraints, Order was written but not actually needed , Bilateral soft wrist restraints were not applied. However order was inadvertently re ordered.   Order has been discontinued. Attending on call updated. Fusaye Wachtel, Bettina Gavia RN

## 2022-04-10 NOTE — Progress Notes (Signed)
Palliative Medicine Progress Note   Patient Name: Tom Ortiz       Date: 04/10/2022 DOB: August 27, 1927  Age: 89 y.o. MRN#: [id number] Attending Physician: Darliss Cheney, MD Primary Care Physician: Pcp, No Admit Date: 04/04/2022  Reason for Consultation/Follow-up: {Reason for Consult:23484}  HPI/Patient Profile: 89 y.o. male  with past medical history of dementia, hearing impairment, and paroxysmal atrial fibrillation not on anticoagulation who presented to [facility] ED on 04/04/2022 with weakness for about 2 weeks.  He was noted to be hypotensive and tachycardic when EMS evaluated him.  In the ED, he was found to be in atrial fibrillation with RVR.  He was also found to have a pulmonary embolism and was started on heparin.  Admitted to [facility] service.   Palliative Medicine was consulted for goals of care.  Subjective: Chart reviewed. Note that patient received 1 dose prn morphine overnight. Patient assessed at bedside. He is awake but confused, attempting pull out his foley catheter. Patient redirected, RN notified and asked to administer prn ativan.   Objective:  Physical Exam Vitals reviewed.  Constitutional:      General: He is awake. He is not in acute distress.    Comments: Frail and chronically ill-appearing  Pulmonary:     Effort: Pulmonary effort is normal.             Vital Signs: BP 120/61 (BP Location: Left Arm)   Pulse (!) 115   Temp 97.9 F (36.6 C) (Oral)   Resp 17   Ht '5\' 10"'$  (1.778 m)   Wt 67.1 kg   SpO2 96%   BMI 21.22 kg/m  SpO2: SpO2: 96 % O2 Device: O2 Device: Room Air O2 Flow Rate: O2 Flow Rate (L/min): 2 L/min     Palliative Assessment/Data: ***     Palliative Medicine Assessment & Plan   Assessment: Principal Problem:   Chronic atrial fibrillation with  RVR (HCC) Active Problems:   HTN (hypertension)   Hyperlipidemia   Acute pulmonary embolism (HCC)   Acute respiratory failure with hypoxia (HCC)   Lactic acid acidosis   AKI (acute kidney injury) (HCC)   Elevated troponin   Acute metabolic encephalopathy   CAD (coronary artery disease)   Protein-calorie malnutrition, severe    Recommendations/Plan: Full comfort measures DNR/DNI  PMT will continue to follow  Symptom Management:  Morphine prn for pain or dyspnea Lorazepam (ATIVAN) prn for anxiety Haloperidol (HALDOL) prn for agitation  Glycopyrrolate (ROBINUL) for excessive secretions Ondansetron (ZOFRAN) prn for nausea Polyvinyl alcohol (LIQUIFILM TEARS) prn for dry eyes Antiseptic oral rinse (BIOTENE) prn for dry mouth   Code Status:   Prognosis:  {Palliative Care Prognosis:23504}  Discharge Planning: {Palliative dispostion:23505}  Care plan was discussed with ***  Thank you for allowing the Palliative Medicine Team to assist in the care of this patient.   ***   Lavena Bullion, NP   Please contact Palliative Medicine Team phone at [phone] for questions and concerns.  For individual providers, please see AMION.

## 2022-04-10 NOTE — Care Management Important Message (Signed)
Important Message  Patient Details  Name: Tom Ortiz MRN: [id number] Date of Birth: 1928/03/10   Medicare Important Message Given:  Yes     Shelda Altes 04/10/2022, 10:59 AM

## 2022-04-10 NOTE — Progress Notes (Signed)
[facility] PROGRESS NOTE   DECLIN RAJAN FIE:[id number] DOB: February 27, 1928 DOA: 04/04/2022  PCP: Pcp, No  Brief History/Interval Summary: 89 year old Caucasian male with past medical history of dementia, hearing impairment, paroxysmal atrial fibrillation not on anticoagulation who was brought into the emergency department after EMS was called due to history of weakness ongoing for about 2 weeks.  He was noted to be hypotensive and tachycardic when EMS evaluated him.  He was brought into the hospital for further management.  Found to be in rapid atrial fibrillation.  Blood pressure improved after fluid resuscitation.  He was found to have acute pulmonary embolism.  He was hospitalized for further management.  Consultants: Cardiology  Procedures: Echocardiogram    Subjective/Interval History: Seen and examined.  He has no specific complaints.  He complains of being tired.    Assessment/Plan:  Atrial fibrillation with RVR with a known history of chronic atrial fibrillation Came in with RVR which improved after he hydration.  He was found to have pulmonary embolism which could have been the inciting event. TSH 1.09.   Cardiology was consulted.  Previously not on anticoagulation due to age, anemia, dementia, frail status. Had a few episodes of RVR.  There was also some concern for ventricular tachycardia but it was likely atrial fibrillation with aberrancy.  He was given a dose of amiodarone.  He converted to sinus rhythm. Continue with oral metoprolol.  Hematuria Patient pulled his Foley catheter out on 1/22.  Developed hematuria.  Heparin was stopped.  Coude catheter had to be placed.  Drop in hemoglobin noted for which he was transfused as mentioned below.  Hemoglobin is stable.  He was transitioned to comfort care on 04/09/2022. Continue Foley catheter for now.  Macrocytic anemia/acute blood loss anemia Baseline hemoglobin seems to be around 10-11.  Came in with hemoglobin of  9.   Drop in hemoglobin partly dilutional and partly due to hematuria. Hemoglobin dropped to 6.9.  He was given 1 unit of PRBC.  Hemoglobin responded and has remained stable.  Acute pulmonary embolism/acute respiratory failure with hypoxia CT angiogram showed pulmonary embolism. Patient was started on heparin. No DVT noted on lower extremity Doppler studies. Echocardiogram shows LVEF to be 50 to 55%.  Right ventricular function was noted to be normal.  No significant valvular abnormalities noted.  Mild AS was noted. Patient was also empirically started on antibiotics at the time of admission but no infection identified.  Antibiotics were discontinued. Patient had significant hematuria after he pulled his Foley out.  Heparin had to be discontinued.  Continues to have bleeding through his catheter. This is a difficult situation. Due to his dementia and other comorbidities he is not a candidate for any kind of invasive workup.  Will palliative care, eventually transition to comfort care on 04/09/2022.  Hypotension, unspecified Could have been due to hypovolemia as he did have acute kidney injury.  Perhaps has had poor oral intake since he has been weak for 2 weeks.  Blood pressure improved with fluid resuscitation Lactic acid levels improved. Blood pressure is stable.  Acute kidney injury on CKD stage IIIa As of 2022 is baseline creatinine was around 1.19.   Came in with creatinine of 1.81.  Was likely secondary to hypovolemia, now back to baseline.  Unspecified abdominal pain, chronic [name] mentions longstanding abdominal pain.  He is wonders if this could be the reason for patient's poor appetite. CT scan of the abdomen pelvis was done which showed nonspecific findings.  Abdominal aneurysm was noted at 4.9 cm.  Cholelithiasis and nephrolithiasis was noted.  LFTs were noted to be normal.  No other acute findings noted on CT scan.  His abdomen is benign.  Do not anticipate any further  workup.  Patient seems to be asymptomatic for the most part.  His oral intake is poor but he does not have any nausea or vomiting.    Chronic systolic CHF Stable.  History of dementia with some delirium Continue Namenda.  Seizure disorder Informed that patient was not on keppra.  Keppra was discontinued.    History of closed intertrochanteric fracture of the left hip with delayed healing Has seen orthopedics previously.  Last surgery was 10 years ago.  Up until 2 weeks ago patient was ambulatory in the house using a walker.  Goals of care Previous hospitalist/Dr. [place] Pink discussed with patient's son and he agreed to transition to comfort care on 04/09/2022.  DVT Prophylaxis: IV heparin is currently on hold. Code Status: DNR Family Communication: No family at bedside.   Disposition Plan: To be determined.  Palliative care to further discuss with the son.  Status is: Inpatient Remains inpatient appropriate because: Now comfort care, pending placement   Medications: Scheduled:  metoprolol tartrate  12.5 mg Oral BID   tamsulosin  0.4 mg Oral Daily   Continuous:   ULA:[id number] **OR** acetaminophen, antiseptic oral rinse, glycopyrrolate **OR** glycopyrrolate **OR** glycopyrrolate, haloperidol **OR** haloperidol **OR** haloperidol lactate, LORazepam **OR** LORazepam **OR** LORazepam, magic mouthwash, morphine injection, ondansetron **OR** ondansetron (ZOFRAN) IV, polyvinyl alcohol, senna-docusate  Antibiotics: Anti-infectives (From admission, onward)    Start     Dose/Rate Route Frequency Ordered Stop   04/05/22 2000  ceFEPIme (MAXIPIME) 2 g in sodium chloride 0.9 % 100 mL IVPB  Status:  Discontinued        2 g 200 mL/hr over 30 Minutes Intravenous Every 24 hours 04/04/22 2145 04/05/22 0915   04/04/22 2145  vancomycin variable dose per unstable renal function (pharmacist dosing)  Status:  Discontinued         Does not apply See admin instructions 04/04/22 2145 04/05/22 0915    04/04/22 2000  ceFEPIme (MAXIPIME) 2 g in sodium chloride 0.9 % 100 mL IVPB        2 g 200 mL/hr over 30 Minutes Intravenous  Once 04/04/22 1946 04/04/22 2058   04/04/22 2000  metroNIDAZOLE (FLAGYL) IVPB 500 mg        500 mg 100 mL/hr over 60 Minutes Intravenous  Once 04/04/22 1946 04/04/22 2249   04/04/22 2000  vancomycin (VANCOCIN) IVPB 1000 mg/200 mL premix  Status:  Discontinued        1,000 mg 200 mL/hr over 60 Minutes Intravenous  Once 04/04/22 1946 04/04/22 1949   04/04/22 2000  vancomycin (VANCOREADY) IVPB 1250 mg/250 mL        1,250 mg 166.7 mL/hr over 90 Minutes Intravenous  Once 04/04/22 1949 04/04/22 2250       Objective:  Vital Signs  Vitals:   04/09/22 0803 04/09/22 1656 04/09/22 1918 04/10/22 0733  BP: (!) 125/58 105/61 99/85 120/61  Pulse: 86 91 88 99  Resp: (!) '21  18 17  '$ Temp: 99.2 F (37.3 C) 98.8 F (37.1 C) 98.4 F (36.9 C) 97.9 F (36.6 C)  TempSrc: Oral Axillary Oral Oral  SpO2: 99% 100% 100% 100%  Weight:      Height:        Intake/Output Summary (Last 24 hours) at 04/10/2022 4825 Last data filed  at 04/10/2022 0930 Gross per 24 hour  Intake 487 ml  Output 790 ml  Net -303 ml    Filed Weights   04/04/22 2142  Weight: 67.1 kg   General exam: Appears calm and comfortable  Respiratory system: Clear to auscultation. Respiratory effort normal. Cardiovascular system: S1 & S2 heard, RRR. No JVD, murmurs, rubs, gallops or clicks. No pedal edema. Gastrointestinal system: Abdomen is nondistended, soft and nontender. No organomegaly or masses felt. Normal bowel sounds heard. Central nervous system: Alert and oriented. No focal neurological deficits. Extremities: Symmetric 5 x 5 power. Skin: No rashes, lesions or ulcers.    Lab Results:  Data Reviewed: I have personally reviewed following labs and reports of the imaging studies  CBC: Recent Labs  Lab 04/04/22 1946 04/05/22 0221 04/06/22 0050 04/07/22 0241 04/07/22 0712 04/07/22 1358  04/07/22 1956 04/08/22 0250 04/09/22 0204  WBC 8.0   < > 6.4 5.3  --  4.8  --  6.9 5.4  NEUTROABS 5.0  --   --   --   --   --   --   --   --   HGB 9.0*   < > 8.0* 7.3* 7.1* 6.9* 8.5* 8.9* 8.7*  HCT 28.1*   < > 23.8* 21.8* 22.1* 21.7* 25.2* 26.5* 26.5*  MCV 115.2*   < > 111.2* 109.5*  --  112.4*  --  104.7* 106.4*  PLT 174   < > 166 178  --  187  --  197 190   < > = values in this interval not displayed.     Basic Metabolic Panel: Recent Labs  Lab 04/05/22 0221 04/05/22 1530 04/06/22 0050 04/07/22 0241 04/08/22 0250 04/09/22 0204  NA 137  --  136 135 137 138  K 4.3  --  4.4 3.9 4.2 4.3  CL 105  --  109 111 112* 113*  CO2 23  --  23 19* 20* 22  GLUCOSE 112*  --  97 99 94 107*  BUN 58*  --  56* 54* 50* 45*  CREATININE 1.68*  --  1.66* 1.47* 1.50* 1.25*  CALCIUM 7.4*  --  7.2* 7.0* 7.2* 7.2*  MG  --  2.0 2.6*  --   --   --      GFR: Estimated Creatinine Clearance: 35.8 mL/min (A) (by C-G formula based on SCr of 1.25 mg/dL (H)).  Liver Function Tests: Recent Labs  Lab 04/04/22 1946  AST 38  ALT 19  ALKPHOS 96  BILITOT 1.0  PROT 5.2*  ALBUMIN 1.7*      Coagulation Profile: Recent Labs  Lab 04/04/22 1946  INR 1.3*     Anemia Panel: Recent Labs    04/07/22 1358  FERRITIN 372*  TIBC 104*  IRON 62  RETICCTPCT 1.1     Recent Results (from the past 240 hour(s))  Urine Culture     Status: None   Collection Time: 04/04/22  7:46 PM   Specimen: In/Out Cath Urine  Result Value Ref Range Status   Specimen Description IN/OUT CATH URINE  Final   Special Requests NONE  Final   Culture   Final    NO GROWTH Performed at [facility], [place] [address], [place], [place] [address]    Report Status 04/06/2022 FINAL  Final  Blood Culture (routine x 2)     Status: None   Collection Time: 04/04/22  8:00 PM   Specimen: BLOOD  Result Value Ref Range Status  Specimen Description BLOOD SITE NOT SPECIFIED  Final   Special Requests   Final    BOTTLES DRAWN  AEROBIC AND ANAEROBIC Blood Culture adequate volume   Culture   Final    NO GROWTH 5 DAYS Performed at [facility], [address] [place], [place] [address]    Report Status 04/09/2022 FINAL  Final  Resp panel by RT-PCR (RSV, Flu A&B, Covid) Anterior Nasal Swab     Status: None   Collection Time: 04/04/22  8:26 PM   Specimen: Anterior Nasal Swab  Result Value Ref Range Status   SARS Coronavirus 2 by RT PCR NEGATIVE NEGATIVE Final    Comment: (NOTE) SARS-CoV-2 target nucleic acids are NOT DETECTED.  The SARS-CoV-2 RNA is generally detectable in upper respiratory specimens during the acute phase of infection. The lowest concentration of SARS-CoV-2 viral copies this assay can detect is 138 copies/mL. A negative result does not preclude SARS-Cov-2 infection and should not be used as the sole basis for treatment or other patient management decisions. A negative result may occur with  improper specimen collection/handling, submission of specimen other than nasopharyngeal swab, presence of viral mutation(s) within the areas targeted by this assay, and inadequate number of viral copies(<138 copies/mL). A negative result must be combined with clinical observations, patient history, and epidemiological information. The expected result is Negative.  Fact Sheet for Patients:  EntrepreneurPulse.com.au  Fact Sheet for Healthcare Providers:  IncredibleEmployment.be  This test is no t yet approved or cleared by the [place] FDA and  has been authorized for detection and/or diagnosis of SARS-CoV-2 by FDA under an Emergency Use Authorization (EUA). This EUA will remain  in effect (meaning this test can be used) for the duration of the COVID-19 declaration under Section 564(b)(1) of the Act, 21 U.S.C.section 360bbb-3(b)(1), unless the authorization is terminated  or revoked sooner.       Influenza A by PCR NEGATIVE NEGATIVE Final   Influenza B  by PCR NEGATIVE NEGATIVE Final    Comment: (NOTE) The Xpert Xpress SARS-CoV-2/FLU/RSV plus assay is intended as an aid in the diagnosis of influenza from Nasopharyngeal swab specimens and should not be used as a sole basis for treatment. Nasal washings and aspirates are unacceptable for Xpert Xpress SARS-CoV-2/FLU/RSV testing.  Fact Sheet for Patients: EntrepreneurPulse.com.au  Fact Sheet for Healthcare Providers: IncredibleEmployment.be  This test is not yet approved or cleared by the [place] FDA and has been authorized for detection and/or diagnosis of SARS-CoV-2 by FDA under an Emergency Use Authorization (EUA). This EUA will remain in effect (meaning this test can be used) for the duration of the COVID-19 declaration under Section 564(b)(1) of the Act, 21 U.S.C. section 360bbb-3(b)(1), unless the authorization is terminated or revoked.     Resp Syncytial Virus by PCR NEGATIVE NEGATIVE Final    Comment: (NOTE) Fact Sheet for Patients: EntrepreneurPulse.com.au  Fact Sheet for Healthcare Providers: IncredibleEmployment.be  This test is not yet approved or cleared by the [place] FDA and has been authorized for detection and/or diagnosis of SARS-CoV-2 by FDA under an Emergency Use Authorization (EUA). This EUA will remain in effect (meaning this test can be used) for the duration of the COVID-19 declaration under Section 564(b)(1) of the Act, 21 U.S.C. section 360bbb-3(b)(1), unless the authorization is terminated or revoked.  Performed at [facility], [place] [address], [place], [place] [address]   Blood Culture (routine x 2)     Status: None   Collection Time: 04/04/22  8:26 PM   Specimen: BLOOD  Result Value Ref Range Status   Specimen Description BLOOD SITE NOT SPECIFIED  Final   Special Requests   Final    BOTTLES DRAWN AEROBIC AND ANAEROBIC Blood Culture adequate volume    Culture   Final    NO GROWTH 5 DAYS Performed at [facility], [address] [place], [place] [address]    Report Status 04/09/2022 FINAL  Final      Radiology Studies: No results found.     LOS: 6 days   [facility] Pager on [email or web address]  04/10/2022, 9:43 AM

## 2022-04-11 DIAGNOSIS — I482 Chronic atrial fibrillation, unspecified: Secondary | ICD-10-CM | POA: Diagnosis not present

## 2022-04-11 NOTE — Progress Notes (Signed)
Foley catheter irrigated per MD order without difficulty

## 2022-04-11 NOTE — Progress Notes (Signed)
[facility] PROGRESS NOTE   Tom Ortiz ZLD:[id number] DOB: 1927-04-01 DOA: 04/04/2022  PCP: Pcp, No  Brief History/Interval Summary: 89 year old Caucasian male with past medical history of dementia, hearing impairment, paroxysmal atrial fibrillation not on anticoagulation who was brought into the emergency department after EMS was called due to history of weakness ongoing for about 2 weeks.  He was noted to be hypotensive and tachycardic when EMS evaluated him.  He was brought into the hospital for further management.  Found to be in rapid atrial fibrillation.  Blood pressure improved after fluid resuscitation.  He was found to have acute pulmonary embolism.  He was hospitalized for further management.  Consultants: Cardiology  Procedures: Echocardiogram    Subjective/Interval History: Patient seen and examined.  He has no complaints.  He is alert and oriented to place only.  Assessment/Plan:  Atrial fibrillation with RVR with a known history of chronic atrial fibrillation Came in with RVR which improved after he hydration.  He was found to have pulmonary embolism which could have been the inciting event. TSH 1.09.   Cardiology was consulted.  Previously not on anticoagulation due to age, anemia, dementia, frail status. Had a few episodes of RVR.  There was also some concern for ventricular tachycardia but it was likely atrial fibrillation with aberrancy.  He was given a dose of amiodarone.  He converted to sinus rhythm. Continue with oral metoprolol.  Hematuria Patient pulled his Foley catheter out on 1/22.  Developed hematuria.  Heparin was stopped.  Coude catheter had to be placed.  Drop in hemoglobin noted for which he was transfused as mentioned below.  Hemoglobin is stable.  He was transitioned to comfort care on 04/09/2022. Continue Foley catheter for now.  Macrocytic anemia/acute blood loss anemia Baseline hemoglobin seems to be around 10-11.  Came in with  hemoglobin of 9.   Drop in hemoglobin partly dilutional and partly due to hematuria. Hemoglobin dropped to 6.9.  He was given 1 unit of PRBC.  Hemoglobin responded and has remained stable.  Acute pulmonary embolism/acute respiratory failure with hypoxia CT angiogram showed pulmonary embolism. Patient was started on heparin. No DVT noted on lower extremity Doppler studies. Echocardiogram shows LVEF to be 50 to 55%.  Right ventricular function was noted to be normal.  No significant valvular abnormalities noted.  Mild AS was noted. Patient was also empirically started on antibiotics at the time of admission but no infection identified.  Antibiotics were discontinued. Patient had significant hematuria after he pulled his Foley out.  Heparin had to be discontinued.  Continues to have bleeding through his catheter. This is a difficult situation. Due to his dementia and other comorbidities he is not a candidate for any kind of invasive workup.  Will palliative care, eventually transition to comfort care on 04/09/2022.  Hypotension, unspecified Could have been due to hypovolemia as he did have acute kidney injury.  Perhaps has had poor oral intake since he has been weak for 2 weeks.  Blood pressure improved with fluid resuscitation Lactic acid levels improved. Blood pressure is stable.  Acute kidney injury on CKD stage IIIa As of 2022 is baseline creatinine was around 1.19.   Came in with creatinine of 1.81.  Was likely secondary to hypovolemia, now back to baseline.  Unspecified abdominal pain, chronic [name] mentions longstanding abdominal pain.  He is wonders if this could be the reason for patient's poor appetite. CT scan of the abdomen pelvis was done which showed nonspecific findings.  Abdominal aneurysm was noted at 4.9 cm.  Cholelithiasis and nephrolithiasis was noted.  LFTs were noted to be normal.  No other acute findings noted on CT scan.  His abdomen is benign.  Do not anticipate  any further workup.  Patient seems to be asymptomatic for the most part.  His oral intake is poor but he does not have any nausea or vomiting.    Chronic systolic CHF Stable.  History of dementia with some delirium Continue Namenda.  Seizure disorder Informed that patient was not on keppra.  Keppra was discontinued.    History of closed intertrochanteric fracture of the left hip with delayed healing Has seen orthopedics previously.  Last surgery was 10 years ago.  Up until 2 weeks ago patient was ambulatory in the house using a walker.  Goals of care Previous hospitalist/Dr. [place] Pink discussed with patient's son and he agreed to transition to comfort care on 04/09/2022.  DVT Prophylaxis: IV heparin is currently on hold. Code Status: DNR Family Communication: No family at bedside.   Disposition Plan: To be determined.  Palliative care to further discuss with the son about disposition.  Status is: Inpatient Remains inpatient appropriate because: Now comfort care, pending placement   Medications: Scheduled:  metoprolol tartrate  12.5 mg Oral BID   tamsulosin  0.4 mg Oral Daily   Continuous:   CXK:[id number] **OR** acetaminophen, antiseptic oral rinse, glycopyrrolate **OR** glycopyrrolate **OR** glycopyrrolate, haloperidol **OR** haloperidol **OR** haloperidol lactate, LORazepam **OR** LORazepam **OR** LORazepam, magic mouthwash, morphine injection, ondansetron **OR** ondansetron (ZOFRAN) IV, polyvinyl alcohol, senna-docusate  Antibiotics: Anti-infectives (From admission, onward)    Start     Dose/Rate Route Frequency Ordered Stop   04/05/22 2000  ceFEPIme (MAXIPIME) 2 g in sodium chloride 0.9 % 100 mL IVPB  Status:  Discontinued        2 g 200 mL/hr over 30 Minutes Intravenous Every 24 hours 04/04/22 2145 04/05/22 0915   04/04/22 2145  vancomycin variable dose per unstable renal function (pharmacist dosing)  Status:  Discontinued         Does not apply See admin instructions  04/04/22 2145 04/05/22 0915   04/04/22 2000  ceFEPIme (MAXIPIME) 2 g in sodium chloride 0.9 % 100 mL IVPB        2 g 200 mL/hr over 30 Minutes Intravenous  Once 04/04/22 1946 04/04/22 2058   04/04/22 2000  metroNIDAZOLE (FLAGYL) IVPB 500 mg        500 mg 100 mL/hr over 60 Minutes Intravenous  Once 04/04/22 1946 04/04/22 2249   04/04/22 2000  vancomycin (VANCOCIN) IVPB 1000 mg/200 mL premix  Status:  Discontinued        1,000 mg 200 mL/hr over 60 Minutes Intravenous  Once 04/04/22 1946 04/04/22 1949   04/04/22 2000  vancomycin (VANCOREADY) IVPB 1250 mg/250 mL        1,250 mg 166.7 mL/hr over 90 Minutes Intravenous  Once 04/04/22 1949 04/04/22 2250       Objective:  Vital Signs  Vitals:   04/10/22 1143 04/10/22 1155 04/10/22 2009 04/11/22 0742  BP:   (!) 119/59 (!) 115/59  Pulse: 68 (!) 115 100 86  Resp:   17 14  Temp:   (!) 100.4 F (38 C) 98.2 F (36.8 C)  TempSrc:   Oral Axillary  SpO2:  96% 97% 98%  Weight:      Height:        Intake/Output Summary (Last 24 hours) at 04/11/2022 1055 Last data filed at 04/11/2022  0802 Gross per 24 hour  Intake 30 ml  Output 750 ml  Net -720 ml    Filed Weights   04/04/22 2142  Weight: 67.1 kg   General exam: Appears calm and comfortable  Respiratory system: Clear to auscultation. Respiratory effort normal. Cardiovascular system: S1 & S2 heard, RRR. No JVD, murmurs, rubs, gallops or clicks. No pedal edema. Gastrointestinal system: Abdomen is nondistended, soft and nontender. No organomegaly or masses felt. Normal bowel sounds heard. Central nervous system: Alert and oriented. No focal neurological deficits. Extremities: Symmetric 5 x 5 power. Skin: No rashes, lesions or ulcers.    Lab Results:  Data Reviewed: I have personally reviewed following labs and reports of the imaging studies  CBC: Recent Labs  Lab 04/04/22 1946 04/05/22 0221 04/06/22 0050 04/07/22 0241 04/07/22 0712 04/07/22 1358 04/07/22 1956  04/08/22 0250 04/09/22 0204  WBC 8.0   < > 6.4 5.3  --  4.8  --  6.9 5.4  NEUTROABS 5.0  --   --   --   --   --   --   --   --   HGB 9.0*   < > 8.0* 7.3* 7.1* 6.9* 8.5* 8.9* 8.7*  HCT 28.1*   < > 23.8* 21.8* 22.1* 21.7* 25.2* 26.5* 26.5*  MCV 115.2*   < > 111.2* 109.5*  --  112.4*  --  104.7* 106.4*  PLT 174   < > 166 178  --  187  --  197 190   < > = values in this interval not displayed.     Basic Metabolic Panel: Recent Labs  Lab 04/05/22 0221 04/05/22 1530 04/06/22 0050 04/07/22 0241 04/08/22 0250 04/09/22 0204  NA 137  --  136 135 137 138  K 4.3  --  4.4 3.9 4.2 4.3  CL 105  --  109 111 112* 113*  CO2 23  --  23 19* 20* 22  GLUCOSE 112*  --  97 99 94 107*  BUN 58*  --  56* 54* 50* 45*  CREATININE 1.68*  --  1.66* 1.47* 1.50* 1.25*  CALCIUM 7.4*  --  7.2* 7.0* 7.2* 7.2*  MG  --  2.0 2.6*  --   --   --      GFR: Estimated Creatinine Clearance: 35.8 mL/min (A) (by C-G formula based on SCr of 1.25 mg/dL (H)).  Liver Function Tests: Recent Labs  Lab 04/04/22 1946  AST 38  ALT 19  ALKPHOS 96  BILITOT 1.0  PROT 5.2*  ALBUMIN 1.7*      Coagulation Profile: Recent Labs  Lab 04/04/22 1946  INR 1.3*     Anemia Panel: No results for input(s): "VITAMINB12", "FOLATE", "FERRITIN", "TIBC", "IRON", "RETICCTPCT" in the last 72 hours.   Recent Results (from the past 240 hour(s))  Urine Culture     Status: None   Collection Time: 04/04/22  7:46 PM   Specimen: In/Out Cath Urine  Result Value Ref Range Status   Specimen Description IN/OUT CATH URINE  Final   Special Requests NONE  Final   Culture   Final    NO GROWTH Performed at [facility], [address] [place], [place] [address]    Report Status 04/06/2022 FINAL  Final  Blood Culture (routine x 2)     Status: None   Collection Time: 04/04/22  8:00 PM   Specimen: BLOOD  Result Value Ref Range Status   Specimen Description BLOOD SITE NOT SPECIFIED  Final  Special Requests   Final    BOTTLES  DRAWN AEROBIC AND ANAEROBIC Blood Culture adequate volume   Culture   Final    NO GROWTH 5 DAYS Performed at [facility], [place] [address], [place], [place] [address]    Report Status 04/09/2022 FINAL  Final  Resp panel by RT-PCR (RSV, Flu A&B, Covid) Anterior Nasal Swab     Status: None   Collection Time: 04/04/22  8:26 PM   Specimen: Anterior Nasal Swab  Result Value Ref Range Status   SARS Coronavirus 2 by RT PCR NEGATIVE NEGATIVE Final    Comment: (NOTE) SARS-CoV-2 target nucleic acids are NOT DETECTED.  The SARS-CoV-2 RNA is generally detectable in upper respiratory specimens during the acute phase of infection. The lowest concentration of SARS-CoV-2 viral copies this assay can detect is 138 copies/mL. A negative result does not preclude SARS-Cov-2 infection and should not be used as the sole basis for treatment or other patient management decisions. A negative result may occur with  improper specimen collection/handling, submission of specimen other than nasopharyngeal swab, presence of viral mutation(s) within the areas targeted by this assay, and inadequate number of viral copies(<138 copies/mL). A negative result must be combined with clinical observations, patient history, and epidemiological information. The expected result is Negative.  Fact Sheet for Patients:  EntrepreneurPulse.com.au  Fact Sheet for Healthcare Providers:  IncredibleEmployment.be  This test is no t yet approved or cleared by the [place] FDA and  has been authorized for detection and/or diagnosis of SARS-CoV-2 by FDA under an Emergency Use Authorization (EUA). This EUA will remain  in effect (meaning this test can be used) for the duration of the COVID-19 declaration under Section 564(b)(1) of the Act, 21 U.S.C.section 360bbb-3(b)(1), unless the authorization is terminated  or revoked sooner.       Influenza A by PCR NEGATIVE NEGATIVE Final    Influenza B by PCR NEGATIVE NEGATIVE Final    Comment: (NOTE) The Xpert Xpress SARS-CoV-2/FLU/RSV plus assay is intended as an aid in the diagnosis of influenza from Nasopharyngeal swab specimens and should not be used as a sole basis for treatment. Nasal washings and aspirates are unacceptable for Xpert Xpress SARS-CoV-2/FLU/RSV testing.  Fact Sheet for Patients: EntrepreneurPulse.com.au  Fact Sheet for Healthcare Providers: IncredibleEmployment.be  This test is not yet approved or cleared by the [place] FDA and has been authorized for detection and/or diagnosis of SARS-CoV-2 by FDA under an Emergency Use Authorization (EUA). This EUA will remain in effect (meaning this test can be used) for the duration of the COVID-19 declaration under Section 564(b)(1) of the Act, 21 U.S.C. section 360bbb-3(b)(1), unless the authorization is terminated or revoked.     Resp Syncytial Virus by PCR NEGATIVE NEGATIVE Final    Comment: (NOTE) Fact Sheet for Patients: EntrepreneurPulse.com.au  Fact Sheet for Healthcare Providers: IncredibleEmployment.be  This test is not yet approved or cleared by the [place] FDA and has been authorized for detection and/or diagnosis of SARS-CoV-2 by FDA under an Emergency Use Authorization (EUA). This EUA will remain in effect (meaning this test can be used) for the duration of the COVID-19 declaration under Section 564(b)(1) of the Act, 21 U.S.C. section 360bbb-3(b)(1), unless the authorization is terminated or revoked.  Performed at [facility], [place] [address] [place], [place] [address]   Blood Culture (routine x 2)     Status: None   Collection Time: 04/04/22  8:26 PM   Specimen: BLOOD  Result Value  Ref Range Status   Specimen Description BLOOD SITE NOT SPECIFIED  Final   Special Requests   Final    BOTTLES DRAWN AEROBIC AND ANAEROBIC Blood Culture adequate  volume   Culture   Final    NO GROWTH 5 DAYS Performed at [facility], [address] [place], [place] [address]    Report Status 04/09/2022 FINAL  Final      Radiology Studies: No results found.     LOS: 7 days   [facility] Pager on [email or web address]  04/11/2022, 10:55 AM

## 2022-04-12 DIAGNOSIS — J9601 Acute respiratory failure with hypoxia: Secondary | ICD-10-CM | POA: Diagnosis not present

## 2022-04-12 DIAGNOSIS — G9341 Metabolic encephalopathy: Secondary | ICD-10-CM | POA: Diagnosis not present

## 2022-04-12 DIAGNOSIS — I482 Chronic atrial fibrillation, unspecified: Secondary | ICD-10-CM | POA: Diagnosis not present

## 2022-04-12 DIAGNOSIS — E43 Unspecified severe protein-calorie malnutrition: Secondary | ICD-10-CM | POA: Diagnosis not present

## 2022-04-12 DIAGNOSIS — I2699 Other pulmonary embolism without acute cor pulmonale: Secondary | ICD-10-CM | POA: Diagnosis not present

## 2022-04-12 MED ORDER — OLANZAPINE 5 MG PO TABS
5.0000 mg | ORAL_TABLET | Freq: Every day | ORAL | Status: DC
  Administered 2022-04-12 – 2022-04-13 (×2): 5 mg via ORAL
  Filled 2022-04-12 (×3): qty 1

## 2022-04-12 MED ORDER — FENTANYL 12 MCG/HR TD PT72
1.0000 | MEDICATED_PATCH | TRANSDERMAL | Status: DC
  Administered 2022-04-12: 1 via TRANSDERMAL
  Filled 2022-04-12: qty 1

## 2022-04-12 MED ORDER — OLANZAPINE 2.5 MG PO TABS: 2.5000 mg | ORAL_TABLET | Freq: Every day | ORAL | Status: DC

## 2022-04-12 NOTE — Progress Notes (Signed)
[facility] PROGRESS NOTE   Tom Ortiz ZOX:[id number] DOB: 1927/09/09 DOA: 04/04/2022  PCP: Pcp, No  Brief History/Interval Summary: 89 year old Caucasian male with past medical history of dementia, hearing impairment, paroxysmal atrial fibrillation not on anticoagulation who was brought into the emergency department after EMS was called due to history of weakness ongoing for about 2 weeks.  He was noted to be hypotensive and tachycardic when EMS evaluated him.  He was brought into the hospital for further management.  Found to be in rapid atrial fibrillation.  Blood pressure improved after fluid resuscitation.  He was found to have acute pulmonary embolism.  He was hospitalized for further management.  Eventually transitioned to comfort care.  Consultants: Cardiology  Procedures: Echocardiogram    Subjective/Interval History: Patient seen and examined.  He is alert and oriented to self.  He is comfortable.  Has hand mittens.  Assessment/Plan:  Atrial fibrillation with RVR with a known history of chronic atrial fibrillation Came in with RVR which improved after he hydration.  He was found to have pulmonary embolism which could have been the inciting event. TSH 1.09.   Cardiology was consulted.  Previously not on anticoagulation due to age, anemia, dementia, frail status. Had a few episodes of RVR.  There was also some concern for ventricular tachycardia but it was likely atrial fibrillation with aberrancy.  He was given a dose of amiodarone.  He converted to sinus rhythm. Continue with oral metoprolol.  Hematuria Patient pulled his Foley catheter out on 1/22.  Developed hematuria.  Heparin was stopped.  Coude catheter had to be placed.  Drop in hemoglobin noted for which he was transfused as mentioned below.  Hemoglobin is stable.  He was transitioned to comfort care on 04/09/2022. Continue Foley catheter for now.  Macrocytic anemia/acute blood loss anemia Baseline  hemoglobin seems to be around 10-11.  Came in with hemoglobin of 9.   Drop in hemoglobin partly dilutional and partly due to hematuria. Hemoglobin dropped to 6.9.  He was given 1 unit of PRBC.  Hemoglobin responded and has remained stable.  Acute pulmonary embolism/acute respiratory failure with hypoxia CT angiogram showed pulmonary embolism. Patient was started on heparin. No DVT noted on lower extremity Doppler studies. Echocardiogram shows LVEF to be 50 to 55%.  Right ventricular function was noted to be normal.  No significant valvular abnormalities noted.  Mild AS was noted. Patient was also empirically started on antibiotics at the time of admission but no infection identified.  Antibiotics were discontinued. Patient had significant hematuria after he pulled his Foley out.  Heparin had to be discontinued.  Continues to have bleeding through his catheter. This is a difficult situation. Due to his dementia and other comorbidities he is not a candidate for any kind of invasive workup.  Will palliative care, eventually transition to comfort care on 04/09/2022.  Hypotension, unspecified Could have been due to hypovolemia as he did have acute kidney injury.  Perhaps has had poor oral intake since he has been weak for 2 weeks.  Blood pressure improved with fluid resuscitation Lactic acid levels improved. Blood pressure is stable.  Acute kidney injury on CKD stage IIIa As of 2022 is baseline creatinine was around 1.19.   Came in with creatinine of 1.81.  Was likely secondary to hypovolemia, now back to baseline.  Unspecified abdominal pain, chronic [name] mentions longstanding abdominal pain.  He is wonders if this could be the reason for patient's poor appetite. CT scan of the  abdomen pelvis was done which showed nonspecific findings.  Abdominal aneurysm was noted at 4.9 cm.  Cholelithiasis and nephrolithiasis was noted.  LFTs were noted to be normal.  No other acute findings noted on CT  scan.  His abdomen is benign.  Do not anticipate any further workup.  Patient seems to be asymptomatic for the most part.  His oral intake is poor but he does not have any nausea or vomiting.    Chronic systolic CHF Stable.  History of dementia with some delirium Continue Namenda.  Seizure disorder Informed that patient was not on keppra.  Keppra was discontinued.    History of closed intertrochanteric fracture of the left hip with delayed healing Has seen orthopedics previously.  Last surgery was 10 years ago.  Up until 2 weeks ago patient was ambulatory in the house using a walker.  Goals of care Previous hospitalist/Dr. [place] Pink discussed with patient's son and he agreed to transition to comfort care on 04/09/2022.  DVT Prophylaxis: IV heparin is currently on hold. Code Status: DNR Family Communication: No family at bedside.   Disposition Plan: To be determined.  Palliative care to further discuss with the son about disposition.  Status is: Inpatient Remains inpatient appropriate because: Now comfort care, pending placement   Medications: Scheduled:  metoprolol tartrate  12.5 mg Oral BID   tamsulosin  0.4 mg Oral Daily   Continuous:   PYK:[id number] **OR** acetaminophen, antiseptic oral rinse, glycopyrrolate **OR** glycopyrrolate **OR** glycopyrrolate, haloperidol **OR** haloperidol **OR** haloperidol lactate, LORazepam **OR** LORazepam **OR** LORazepam, magic mouthwash, morphine injection, ondansetron **OR** ondansetron (ZOFRAN) IV, polyvinyl alcohol, senna-docusate  Antibiotics: Anti-infectives (From admission, onward)    Start     Dose/Rate Route Frequency Ordered Stop   04/05/22 2000  ceFEPIme (MAXIPIME) 2 g in sodium chloride 0.9 % 100 mL IVPB  Status:  Discontinued        2 g 200 mL/hr over 30 Minutes Intravenous Every 24 hours 04/04/22 2145 04/05/22 0915   04/04/22 2145  vancomycin variable dose per unstable renal function (pharmacist dosing)  Status:   Discontinued         Does not apply See admin instructions 04/04/22 2145 04/05/22 0915   04/04/22 2000  ceFEPIme (MAXIPIME) 2 g in sodium chloride 0.9 % 100 mL IVPB        2 g 200 mL/hr over 30 Minutes Intravenous  Once 04/04/22 1946 04/04/22 2058   04/04/22 2000  metroNIDAZOLE (FLAGYL) IVPB 500 mg        500 mg 100 mL/hr over 60 Minutes Intravenous  Once 04/04/22 1946 04/04/22 2249   04/04/22 2000  vancomycin (VANCOCIN) IVPB 1000 mg/200 mL premix  Status:  Discontinued        1,000 mg 200 mL/hr over 60 Minutes Intravenous  Once 04/04/22 1946 04/04/22 1949   04/04/22 2000  vancomycin (VANCOREADY) IVPB 1250 mg/250 mL        1,250 mg 166.7 mL/hr over 90 Minutes Intravenous  Once 04/04/22 1949 04/04/22 2250       Objective:  Vital Signs  Vitals:   04/10/22 2009 04/11/22 0742 04/11/22 2119 04/12/22 0820  BP: (!) 119/59 (!) 115/59 121/70 136/71  Pulse: 100 86 91 100  Resp: '17 14 18 20  '$ Temp: (!) 100.4 F (38 C) 98.2 F (36.8 C) 97.8 F (36.6 C) 98.7 F (37.1 C)  TempSrc: Oral Axillary Oral Oral  SpO2: 97% 98% 98%   Weight:      Height:  Intake/Output Summary (Last 24 hours) at 04/12/2022 0906 Last data filed at 04/11/2022 2000 Gross per 24 hour  Intake 240 ml  Output --  Net 240 ml    Filed Weights   04/04/22 2142  Weight: 67.1 kg   General exam: Appears calm and comfortable  Respiratory system: Clear to auscultation. Respiratory effort normal. Cardiovascular system: S1 & S2 heard, RRR. No JVD, murmurs, rubs, gallops or clicks. No pedal edema. Gastrointestinal system: Abdomen is nondistended, soft and nontender. No organomegaly or masses felt. Normal bowel sounds heard. Central nervous system: Alert and oriented. No focal neurological deficits. Extremities: Symmetric 5 x 5 power. Skin: No rashes, lesions or ulcers.    Lab Results:  Data Reviewed: I have personally reviewed following labs and reports of the imaging studies  CBC: Recent Labs  Lab  04/06/22 0050 04/07/22 0241 04/07/22 0712 04/07/22 1358 04/07/22 1956 04/08/22 0250 04/09/22 0204  WBC 6.4 5.3  --  4.8  --  6.9 5.4  HGB 8.0* 7.3* 7.1* 6.9* 8.5* 8.9* 8.7*  HCT 23.8* 21.8* 22.1* 21.7* 25.2* 26.5* 26.5*  MCV 111.2* 109.5*  --  112.4*  --  104.7* 106.4*  PLT 166 178  --  187  --  197 190     Basic Metabolic Panel: Recent Labs  Lab 04/05/22 1530 04/06/22 0050 04/07/22 0241 04/08/22 0250 04/09/22 0204  NA  --  136 135 137 138  K  --  4.4 3.9 4.2 4.3  CL  --  109 111 112* 113*  CO2  --  23 19* 20* 22  GLUCOSE  --  97 99 94 107*  BUN  --  56* 54* 50* 45*  CREATININE  --  1.66* 1.47* 1.50* 1.25*  CALCIUM  --  7.2* 7.0* 7.2* 7.2*  MG 2.0 2.6*  --   --   --      GFR: Estimated Creatinine Clearance: 35.8 mL/min (A) (by C-G formula based on SCr of 1.25 mg/dL (H)).  Liver Function Tests: No results for input(s): "AST", "ALT", "ALKPHOS", "BILITOT", "PROT", "ALBUMIN" in the last 168 hours.    Coagulation Profile: No results for input(s): "INR", "PROTIME" in the last 168 hours.   Anemia Panel: No results for input(s): "VITAMINB12", "FOLATE", "FERRITIN", "TIBC", "IRON", "RETICCTPCT" in the last 72 hours.   Recent Results (from the past 240 hour(s))  Urine Culture     Status: None   Collection Time: 04/04/22  7:46 PM   Specimen: In/Out Cath Urine  Result Value Ref Range Status   Specimen Description IN/OUT CATH URINE  Final   Special Requests NONE  Final   Culture   Final    NO GROWTH Performed at [facility], [address], [place], [place] [address]    Report Status 04/06/2022 FINAL  Final  Blood Culture (routine x 2)     Status: None   Collection Time: 04/04/22  8:00 PM   Specimen: BLOOD  Result Value Ref Range Status   Specimen Description BLOOD SITE NOT SPECIFIED  Final   Special Requests   Final    BOTTLES DRAWN AEROBIC AND ANAEROBIC Blood Culture adequate volume   Culture   Final    NO GROWTH 5 DAYS Performed at [facility], [place] [address], [place], [place] [address]    Report Status 04/09/2022 FINAL  Final  Resp panel by RT-PCR (RSV, Flu A&B, Covid) Anterior Nasal Swab     Status: None   Collection Time: 04/04/22  8:26 PM   Specimen: Anterior Nasal Swab  Result Value Ref Range Status   SARS Coronavirus 2 by RT PCR NEGATIVE NEGATIVE Final    Comment: (NOTE) SARS-CoV-2 target nucleic acids are NOT DETECTED.  The SARS-CoV-2 RNA is generally detectable in upper respiratory specimens during the acute phase of infection. The lowest concentration of SARS-CoV-2 viral copies this assay can detect is 138 copies/mL. A negative result does not preclude SARS-Cov-2 infection and should not be used as the sole basis for treatment or other patient management decisions. A negative result may occur with  improper specimen collection/handling, submission of specimen other than nasopharyngeal swab, presence of viral mutation(s) within the areas targeted by this assay, and inadequate number of viral copies(<138 copies/mL). A negative result must be combined with clinical observations, patient history, and epidemiological information. The expected result is Negative.  Fact Sheet for Patients:  EntrepreneurPulse.com.au  Fact Sheet for Healthcare Providers:  IncredibleEmployment.be  This test is no t yet approved or cleared by the [place] FDA and  has been authorized for detection and/or diagnosis of SARS-CoV-2 by FDA under an Emergency Use Authorization (EUA). This EUA will remain  in effect (meaning this test can be used) for the duration of the COVID-19 declaration under Section 564(b)(1) of the Act, 21 U.S.C.section 360bbb-3(b)(1), unless the authorization is terminated  or revoked sooner.       Influenza A by PCR NEGATIVE NEGATIVE Final   Influenza B by PCR NEGATIVE NEGATIVE Final    Comment: (NOTE) The Xpert Xpress SARS-CoV-2/FLU/RSV plus assay is intended as an  aid in the diagnosis of influenza from Nasopharyngeal swab specimens and should not be used as a sole basis for treatment. Nasal washings and aspirates are unacceptable for Xpert Xpress SARS-CoV-2/FLU/RSV testing.  Fact Sheet for Patients: EntrepreneurPulse.com.au  Fact Sheet for Healthcare Providers: IncredibleEmployment.be  This test is not yet approved or cleared by the [place] FDA and has been authorized for detection and/or diagnosis of SARS-CoV-2 by FDA under an Emergency Use Authorization (EUA). This EUA will remain in effect (meaning this test can be used) for the duration of the COVID-19 declaration under Section 564(b)(1) of the Act, 21 U.S.C. section 360bbb-3(b)(1), unless the authorization is terminated or revoked.     Resp Syncytial Virus by PCR NEGATIVE NEGATIVE Final    Comment: (NOTE) Fact Sheet for Patients: EntrepreneurPulse.com.au  Fact Sheet for Healthcare Providers: IncredibleEmployment.be  This test is not yet approved or cleared by the [place] FDA and has been authorized for detection and/or diagnosis of SARS-CoV-2 by FDA under an Emergency Use Authorization (EUA). This EUA will remain in effect (meaning this test can be used) for the duration of the COVID-19 declaration under Section 564(b)(1) of the Act, 21 U.S.C. section 360bbb-3(b)(1), unless the authorization is terminated or revoked.  Performed at [facility], [employer] [address], [place], [place] [address]   Blood Culture (routine x 2)     Status: None   Collection Time: 04/04/22  8:26 PM   Specimen: BLOOD  Result Value Ref Range Status   Specimen Description BLOOD SITE NOT SPECIFIED  Final   Special Requests   Final    BOTTLES DRAWN AEROBIC AND ANAEROBIC Blood Culture adequate volume   Culture   Final    NO GROWTH 5 DAYS Performed at [facility], [address], [place], [place] [address]     Report Status 04/09/2022 FINAL  Final      Radiology Studies: No results  found.     LOS: 8 days   Darliss Cheney  [facility] Pager on [email or web address]  04/12/2022, 9:06 AM

## 2022-04-12 NOTE — Progress Notes (Signed)
Patient pulled out foley with balloon still inflated. Trauma to the area was noted and I was able to stop the bleeding. Doctor is aware.

## 2022-04-12 NOTE — Progress Notes (Signed)
Report given to [facility] RN. Patient will be transported to [place], Bettina Gavia RN

## 2022-04-12 NOTE — Progress Notes (Signed)
Patient has arrived to the floor  

## 2022-04-12 NOTE — Progress Notes (Signed)
Palliative Medicine Progress Note   Patient Name: Tom Ortiz       Date: 04/12/2022 DOB: 1927-04-26  Age: 89 y.o. MRN#: [id number] Attending Physician: Tom Cheney, MD Primary Care Physician: Pcp, No Admit Date: 04/04/2022    HPI/Patient Profile: 89 y.o. male  with past medical history of dementia, hearing impairment, and paroxysmal atrial fibrillation not on anticoagulation who presented to [facility] ED on 04/04/2022 with weakness for about 2 weeks.  He was noted to be hypotensive and tachycardic when EMS evaluated him.  In the ED, he was found to be in atrial fibrillation with RVR.  He was also found to have a pulmonary embolism and was started on heparin.  Admitted to [facility] service.   Palliative Medicine was consulted for goals of care and patient was transitioned to comfort care on 1/25.  Subjective: Chart reviewed. Per [facility], patient has received no prn medication for symptom management since 1/26.   16:20 -  Patient found with foley catheter out with balloon still inflated. Site assessed; no bleeding noted. He is not in acute distress, but appears restless and generally uncomfortable. RN Erasmo Downer notified via secure chat.    17:40 - Checked back on patient. He is resting comfortably. There is no bleeding from site from site of catheter removal.    Objective:  Physical Exam Vitals reviewed.  Constitutional:      General: He is not in acute distress.    Appearance: He is ill-appearing.  Pulmonary:     Effort: Pulmonary effort is normal.  Neurological:     Mental Status: He is disoriented.  Psychiatric:        Cognition and Memory: Cognition is impaired.              Palliative Medicine Assessment & Plan   Assessment: Principal Problem:   Chronic atrial fibrillation with RVR  (HCC) Active Problems:   HTN (hypertension)   Hyperlipidemia   Acute pulmonary embolism (HCC)   Acute respiratory failure with hypoxia (HCC)   Lactic acid acidosis   AKI (acute kidney injury) (HCC)   Elevated troponin   Acute metabolic encephalopathy   CAD (coronary artery disease)   Protein-calorie malnutrition, severe    Recommendations/Plan: Continue comfort measures Olanzapine 5 mg at bedtime Fentanyl 12 mcg patch every 72 hours PMT will continue to follow  Code Status: DNR   Prognosis:  < 4 weeks  Discharge Planning: To Be Determined    Thank you for allowing the Palliative Medicine Team to assist in the care of this patient.    Lavena Bullion, NP   Please contact Palliative Medicine Team phone at [phone] for questions and concerns.  For individual providers, please see AMION.

## 2022-04-13 DIAGNOSIS — J9601 Acute respiratory failure with hypoxia: Secondary | ICD-10-CM | POA: Diagnosis not present

## 2022-04-13 DIAGNOSIS — I482 Chronic atrial fibrillation, unspecified: Secondary | ICD-10-CM | POA: Diagnosis not present

## 2022-04-13 DIAGNOSIS — I2699 Other pulmonary embolism without acute cor pulmonale: Secondary | ICD-10-CM | POA: Diagnosis not present

## 2022-04-13 DIAGNOSIS — E43 Unspecified severe protein-calorie malnutrition: Secondary | ICD-10-CM | POA: Diagnosis not present

## 2022-04-13 DIAGNOSIS — Z515 Encounter for palliative care: Secondary | ICD-10-CM | POA: Diagnosis not present

## 2022-04-13 NOTE — Progress Notes (Signed)
PROGRESS NOTE    Tom Ortiz  FWY:[id number] DOB: Jun 02, 1927 DOA: 04/04/2022 PCP: Pcp, No   Brief Narrative:  This 89 year old Caucasian male with past medical history of dementia, hearing impairment, paroxysmal atrial fibrillation, not on anticoagulation who was brought into the emergency department after EMS was called due to history of weakness ongoing for about 2 weeks.  He was noted to be hypotensive and tachycardic when EMS evaluated him.  He was brought into the hospital for further management.  Found to be in rapid atrial fibrillation.  Blood pressure improved after fluid resuscitation.  He was found to have acute pulmonary embolism.  He was hospitalized for further management.  Eventually transitioned to comfort care.   Assessment & Plan:   Principal Problem:   Chronic atrial fibrillation with RVR (HCC) Active Problems:   HTN (hypertension)   Hyperlipidemia   Acute pulmonary embolism (HCC)   Acute respiratory failure with hypoxia (HCC)   Lactic acid acidosis   AKI (acute kidney injury) (HCC)   Elevated troponin   Acute metabolic encephalopathy   CAD (coronary artery disease)   Protein-calorie malnutrition, severe  Atrial fibrillation with RVR: Came in with RVR which improved after IVF hydration.  He was found to have pulmonary embolism which could have been the inciting event. TSH 1.09.   Cardiology was consulted.  Previously not on anticoagulation due to age, anemia, dementia, frail status. Had few episodes of RVR.  There was also some concern for ventricular tachycardia but it was likely atrial fibrillation with aberrancy.   He was given a dose of amiodarone. He converted to sinus rhythm. Continue with oral metoprolol. HR controlled now.   Hematuria: Patient has pulled his Foley catheter out on 1/22.  Developed hematuria.  Heparin was stopped.  Coude catheter had to be placed.  Drop in hemoglobin noted for which he was transfused as mentioned below. Hemoglobin is  stable.  He was transitioned to comfort care on 04/09/2022. Continue Foley catheter for now.   Macrocytic anemia /acute blood loss anemia: Baseline hemoglobin seems to be around 10-11.  Came in with hemoglobin of 9.   Drop in hemoglobin partly dilutional and partly due to hematuria. Hemoglobin dropped to 6.9.  He was given 1 unit of PRBC.  Hemoglobin responded and has remained stable.   Acute pulmonary embolism: Acute hypoxic respiratory failure: CT angiogram showed pulmonary embolism. Patient was started on heparin. No DVT noted on lower extremity Doppler studies. Echocardiogram shows LVEF to be 50 to 55%.  Right ventricular function was noted to be normal.  No significant valvular abnormalities noted.  Mild AS was noted. Patient was also empirically started on antibiotics at the time of admission but no infection identified.  Antibiotics were discontinued. Patient had significant hematuria after he pulled his Foley out.  Heparin had to be discontinued.  Continues to have bleeding through his catheter. This is a difficult situation. Due to his dementia and other comorbidities he is not a candidate for any kind of invasive workup. Palliative care consulted, eventually care transitioned to comfort care on 04/09/2022.   Hypotension, unspecified: Could have been due to hypovolemia as he did have acute kidney injury.  Perhaps has had poor oral intake since he has been weak for 2 weeks.  Blood pressure improved with fluid resuscitation. Lactic acid levels improved. Blood pressure is stable.   Acute kidney injury on CKD stage IIIa: As of 2022 his baseline creatinine was around 1.19.   Came in with creatinine of 1.81.  Was likely secondary to hypovolemia, now back to baseline.   Chronic abdominal pain, unspecified: [name] mentions longstanding abdominal pain.  He wonders if this could be the reason for patient's poor appetite. CT scan of the abdomen pelvis was done which showed nonspecific  findings.  Abdominal aneurysm was noted at 4.9 cm.  Cholelithiasis and nephrolithiasis was noted.  LFTs were noted to be normal.  No other acute findings noted on CT scan.  His abdomen is benign.  Do not anticipate any further workup.  Patient seems to be asymptomatic for the most part.  His oral intake is poor but he does not have any nausea or vomiting.     Chronic systolic CHF: Stable.   History of dementia with some delirium: Continue Namenda.   Seizure disorder: Informed that patient was not on keppra.  Keppra was discontinued.     History of closed intertrochanteric fracture of the left hip with delayed healing Has seen orthopedics previously.  Last surgery was 10 years ago.  Up until 2 weeks ago patient was ambulatory in the house using a walker.   Goals of care Previous hospitalist/ Dr. [place] Pink discussed with patient's son and he agreed to transition to comfort care on 04/09/2022.   DVT prophylaxis: IV heparin on hold Code Status: DNR Family Communication: No family at bed side. Disposition Plan:   Care transitioned to comfort care.   Consultants:  Cardiology Palliative care  Procedures:Echocardiogram  Antimicrobials: Anti-infectives (From admission, onward)    Start     Dose/Rate Route Frequency Ordered Stop   04/05/22 2000  ceFEPIme (MAXIPIME) 2 g in sodium chloride 0.9 % 100 mL IVPB  Status:  Discontinued        2 g 200 mL/hr over 30 Minutes Intravenous Every 24 hours 04/04/22 2145 04/05/22 0915   04/04/22 2145  vancomycin variable dose per unstable renal function (pharmacist dosing)  Status:  Discontinued         Does not apply See admin instructions 04/04/22 2145 04/05/22 0915   04/04/22 2000  ceFEPIme (MAXIPIME) 2 g in sodium chloride 0.9 % 100 mL IVPB        2 g 200 mL/hr over 30 Minutes Intravenous  Once 04/04/22 1946 04/04/22 2058   04/04/22 2000  metroNIDAZOLE (FLAGYL) IVPB 500 mg        500 mg 100 mL/hr over 60 Minutes Intravenous  Once 04/04/22 1946  04/04/22 2249   04/04/22 2000  vancomycin (VANCOCIN) IVPB 1000 mg/200 mL premix  Status:  Discontinued        1,000 mg 200 mL/hr over 60 Minutes Intravenous  Once 04/04/22 1946 04/04/22 1949   04/04/22 2000  vancomycin (VANCOREADY) IVPB 1250 mg/250 mL        1,250 mg 166.7 mL/hr over 90 Minutes Intravenous  Once 04/04/22 1949 04/04/22 2250       Subjective: Patient was seen and examined at bedside.  Overnight events noted.   Patient is alert and oriented x 1,  following commands.   He reports having pain in the right foot.  Objective: Vitals:   04/12/22 0820 04/12/22 2023 04/13/22 0500 04/13/22 0908  BP: 136/71 (!) 111/58 124/74 (!) 105/58  Pulse: 100 100 100 86  Resp: '20 20 18 16  '$ Temp: 98.7 F (37.1 C) 99 F (37.2 C) 99.8 F (37.7 C) 99.2 F (37.3 C)  TempSrc: Oral Oral Oral Axillary  SpO2:    95%  Weight:   73.7 kg   Height:  Intake/Output Summary (Last 24 hours) at 04/13/2022 1248 Last data filed at 04/12/2022 2319 Gross per 24 hour  Intake 120 ml  Output --  Net 120 ml   Filed Weights   04/04/22 2142 04/13/22 0500  Weight: 67.1 kg 73.7 kg    Examination:  General exam: Appears comfortable, not in any acute distress. Respiratory system: CTA bilaterally, respiratory effort normal, RR 15. Cardiovascular system: S1 & S2 heard, regular rate and rhythm, no murmur. Gastrointestinal system: Abdomen is soft, non tender, non distended, BS+ Central nervous system: Alert and oriented x 1. No focal neurological deficits. Extremities: Symmetric 5 x 5 power. Skin: No rashes, lesions or ulcers Psychiatry: Judgement and insight appear normal. Mood & affect appropriate.     Data Reviewed: I have personally reviewed following labs and imaging studies  CBC: Recent Labs  Lab 04/07/22 0241 04/07/22 0712 04/07/22 1358 04/07/22 1956 04/08/22 0250 04/09/22 0204  WBC 5.3  --  4.8  --  6.9 5.4  HGB 7.3* 7.1* 6.9* 8.5* 8.9* 8.7*  HCT 21.8* 22.1* 21.7* 25.2* 26.5*  26.5*  MCV 109.5*  --  112.4*  --  104.7* 106.4*  PLT 178  --  187  --  197 062   Basic Metabolic Panel: Recent Labs  Lab 04/07/22 0241 04/08/22 0250 04/09/22 0204  NA 135 137 138  K 3.9 4.2 4.3  CL 111 112* 113*  CO2 19* 20* 22  GLUCOSE 99 94 107*  BUN 54* 50* 45*  CREATININE 1.47* 1.50* 1.25*  CALCIUM 7.0* 7.2* 7.2*   GFR: Estimated Creatinine Clearance: 38.9 mL/min (A) (by C-G formula based on SCr of 1.25 mg/dL (H)). Liver Function Tests: No results for input(s): "AST", "ALT", "ALKPHOS", "BILITOT", "PROT", "ALBUMIN" in the last 168 hours. No results for input(s): "LIPASE", "AMYLASE" in the last 168 hours. No results for input(s): "AMMONIA" in the last 168 hours. Coagulation Profile: No results for input(s): "INR", "PROTIME" in the last 168 hours. Cardiac Enzymes: No results for input(s): "CKTOTAL", "CKMB", "CKMBINDEX", "TROPONINI" in the last 168 hours. BNP (last 3 results) No results for input(s): "PROBNP" in the last 8760 hours. HbA1C: No results for input(s): "HGBA1C" in the last 72 hours. CBG: Recent Labs  Lab 04/06/22 2045  GLUCAP 103*   Lipid Profile: No results for input(s): "CHOL", "HDL", "LDLCALC", "TRIG", "CHOLHDL", "LDLDIRECT" in the last 72 hours. Thyroid Function Tests: No results for input(s): "TSH", "T4TOTAL", "FREET4", "T3FREE", "THYROIDAB" in the last 72 hours. Anemia Panel: No results for input(s): "VITAMINB12", "FOLATE", "FERRITIN", "TIBC", "IRON", "RETICCTPCT" in the last 72 hours. Sepsis Labs: No results for input(s): "PROCALCITON", "LATICACIDVEN" in the last 168 hours.  Recent Results (from the past 240 hour(s))  Urine Culture     Status: None   Collection Time: 04/04/22  7:46 PM   Specimen: In/Out Cath Urine  Result Value Ref Range Status   Specimen Description IN/OUT CATH URINE  Final   Special Requests NONE  Final   Culture   Final    NO GROWTH Performed at [facility], [address], [place], [place] [address]    Report  Status 04/06/2022 FINAL  Final  Blood Culture (routine x 2)     Status: None   Collection Time: 04/04/22  8:00 PM   Specimen: BLOOD  Result Value Ref Range Status   Specimen Description BLOOD SITE NOT SPECIFIED  Final   Special Requests   Final    BOTTLES DRAWN AEROBIC AND ANAEROBIC Blood Culture adequate volume  Culture   Final    NO GROWTH 5 DAYS Performed at [facility], [place] [address] [place], [place] [address]    Report Status 04/09/2022 FINAL  Final  Resp panel by RT-PCR (RSV, Flu A&B, Covid) Anterior Nasal Swab     Status: None   Collection Time: 04/04/22  8:26 PM   Specimen: Anterior Nasal Swab  Result Value Ref Range Status   SARS Coronavirus 2 by RT PCR NEGATIVE NEGATIVE Final    Comment: (NOTE) SARS-CoV-2 target nucleic acids are NOT DETECTED.  The SARS-CoV-2 RNA is generally detectable in upper respiratory specimens during the acute phase of infection. The lowest concentration of SARS-CoV-2 viral copies this assay can detect is 138 copies/mL. A negative result does not preclude SARS-Cov-2 infection and should not be used as the sole basis for treatment or other patient management decisions. A negative result may occur with  improper specimen collection/handling, submission of specimen other than nasopharyngeal swab, presence of viral mutation(s) within the areas targeted by this assay, and inadequate number of viral copies(<138 copies/mL). A negative result must be combined with clinical observations, patient history, and epidemiological information. The expected result is Negative.  Fact Sheet for Patients:  EntrepreneurPulse.com.au  Fact Sheet for Healthcare Providers:  IncredibleEmployment.be  This test is no t yet approved or cleared by the [place] FDA and  has been authorized for detection and/or diagnosis of SARS-CoV-2 by FDA under an Emergency Use Authorization (EUA). This EUA will remain  in effect  (meaning this test can be used) for the duration of the COVID-19 declaration under Section 564(b)(1) of the Act, 21 U.S.C.section 360bbb-3(b)(1), unless the authorization is terminated  or revoked sooner.       Influenza A by PCR NEGATIVE NEGATIVE Final   Influenza B by PCR NEGATIVE NEGATIVE Final    Comment: (NOTE) The Xpert Xpress SARS-CoV-2/FLU/RSV plus assay is intended as an aid in the diagnosis of influenza from Nasopharyngeal swab specimens and should not be used as a sole basis for treatment. Nasal washings and aspirates are unacceptable for Xpert Xpress SARS-CoV-2/FLU/RSV testing.  Fact Sheet for Patients: EntrepreneurPulse.com.au  Fact Sheet for Healthcare Providers: IncredibleEmployment.be  This test is not yet approved or cleared by the [place] FDA and has been authorized for detection and/or diagnosis of SARS-CoV-2 by FDA under an Emergency Use Authorization (EUA). This EUA will remain in effect (meaning this test can be used) for the duration of the COVID-19 declaration under Section 564(b)(1) of the Act, 21 U.S.C. section 360bbb-3(b)(1), unless the authorization is terminated or revoked.     Resp Syncytial Virus by PCR NEGATIVE NEGATIVE Final    Comment: (NOTE) Fact Sheet for Patients: EntrepreneurPulse.com.au  Fact Sheet for Healthcare Providers: IncredibleEmployment.be  This test is not yet approved or cleared by the [place] FDA and has been authorized for detection and/or diagnosis of SARS-CoV-2 by FDA under an Emergency Use Authorization (EUA). This EUA will remain in effect (meaning this test can be used) for the duration of the COVID-19 declaration under Section 564(b)(1) of the Act, 21 U.S.C. section 360bbb-3(b)(1), unless the authorization is terminated or revoked.  Performed at [facility], [place] [address] [place], [place] [address]   Blood Culture  (routine x 2)     Status: None   Collection Time: 04/04/22  8:26 PM   Specimen: BLOOD  Result Value Ref Range Status   Specimen Description BLOOD SITE NOT SPECIFIED  Final   Special Requests  Final    BOTTLES DRAWN AEROBIC AND ANAEROBIC Blood Culture adequate volume   Culture   Final    NO GROWTH 5 DAYS Performed at [facility], [place] [address] [place], [place] [address]    Report Status 04/09/2022 FINAL  Final    Radiology Studies: No results found.  Scheduled Meds:  fentaNYL  1 patch Transdermal Q72H   metoprolol tartrate  12.5 mg Oral BID   OLANZapine  5 mg Oral QHS   tamsulosin  0.4 mg Oral Daily   Continuous Infusions:   LOS: 9 days    Time spent: 50 mins    Keturah Yerby, MD [facility]   If 7PM-7AM, please contact night-coverage

## 2022-04-13 NOTE — Progress Notes (Signed)
Palliative Medicine Progress Note   Patient Name: Tom Ortiz       Date: 04/13/2022 DOB: 1927/07/30  Age: 89 y.o. MRN#: [id number] Attending Physician: Shawna Clamp, MD Primary Care Physician: Pcp, No Admit Date: 04/04/2022  Reason for Consultation/Follow-up: end of life care  HPI/Patient Profile: 89 y.o. male  with past medical history of dementia, hearing impairment, and paroxysmal atrial fibrillation not on anticoagulation who presented to [facility] ED on 04/04/2022 with weakness for about 2 weeks.  He was noted to be hypotensive and tachycardic when EMS evaluated him.  In the ED, he was found to be in atrial fibrillation with RVR.  He was also found to have a pulmonary embolism and was started on heparin.  Admitted to [facility] service.   Palliative Medicine was consulted for goals of care and patient was transitioned to comfort care on 1/25.  Subjective: Patient resting comfortably. He is unresponsive to voice. No non-verbal signs of pain or discomfort noted. Respirations are even and unlabored. No excessive respiratory secretions noted.   I spoke with son by phone. I provided an update on patient pulling out his catheter as well as starting medication to keep him more relaxed and comfortable.  Continued discussion had regarding natural trajectory at EOL. Son expresses some frustration regarding nursing care. Emotional support provided.     Objective:  Physical Exam Vitals reviewed.  Constitutional:      General: He is sleeping. He is not in acute distress.    Appearance: He is ill-appearing.     Comments: Frail  Pulmonary:     Effort: Pulmonary effort is normal.             Palliative Assessment/Data: 10-20%     Palliative Medicine Assessment & Plan   Assessment: Principal  Problem:   Chronic atrial fibrillation with RVR (HCC) Active Problems:   HTN (hypertension)   Hyperlipidemia   Acute pulmonary embolism (HCC)   Acute respiratory failure with hypoxia (HCC)   Lactic acid acidosis   AKI (acute kidney injury) (HCC)   Elevated troponin   Acute metabolic encephalopathy   CAD (coronary artery disease)   Protein-calorie malnutrition, severe    Recommendations/Plan: Continue comfort measures Olanzapine 5 mg at bedtime Fentanyl 12 mcg patch every 72 hours Referral to hosp[facility] facility in [facility] PMT will continue to  follow   Code Status: DNR   Prognosis:  < 2 weeks  Discharge Planning: To Be Determined    Thank you for allowing the Palliative Medicine Team to assist in the care of this patient.   Lavena Bullion, NP   Please contact Palliative Medicine Team phone at [phone] for questions and concerns.  For individual providers, please see AMION.

## 2022-04-14 DIAGNOSIS — I482 Chronic atrial fibrillation, unspecified: Secondary | ICD-10-CM | POA: Diagnosis not present

## 2022-04-14 DIAGNOSIS — G9341 Metabolic encephalopathy: Secondary | ICD-10-CM | POA: Diagnosis not present

## 2022-04-14 DIAGNOSIS — E43 Unspecified severe protein-calorie malnutrition: Secondary | ICD-10-CM | POA: Diagnosis not present

## 2022-04-14 DIAGNOSIS — N179 Acute kidney failure, unspecified: Secondary | ICD-10-CM | POA: Diagnosis not present

## 2022-04-14 DIAGNOSIS — I2699 Other pulmonary embolism without acute cor pulmonale: Secondary | ICD-10-CM | POA: Diagnosis not present

## 2022-04-14 MED ORDER — GLYCOPYRROLATE 1 MG PO TABS: 1.0000 mg | ORAL_TABLET | ORAL | 0 refills | Status: DC | PRN

## 2022-04-14 MED ORDER — HALOPERIDOL 0.5 MG PO TABS: 0.5000 mg | ORAL_TABLET | ORAL | 0 refills | Status: DC | PRN

## 2022-04-14 MED ORDER — OLANZAPINE 5 MG PO TABS: 5.0000 mg | ORAL_TABLET | Freq: Every day | ORAL | 0 refills | Status: DC

## 2022-04-14 NOTE — Discharge Instructions (Signed)
Patient is being discharged to hospice facility in [place]

## 2022-04-14 NOTE — Progress Notes (Signed)
PROGRESS NOTE    Tom Ortiz  HAL:[id number] DOB: Nov 21, 1927 DOA: 04/04/2022 PCP: Pcp, No   Brief Narrative:  This 89 year old Caucasian male with past medical history of dementia, hearing impairment, paroxysmal atrial fibrillation, not on anticoagulation who was brought into the emergency department after EMS was called due to history of weakness ongoing for about 2 weeks.  He was noted to be hypotensive and tachycardic when EMS evaluated him.  He was brought into the hospital for further management.  Found to be in rapid atrial fibrillation.  Blood pressure improved after fluid resuscitation.  He was found to have acute pulmonary embolism.  He was hospitalized for further management.  Eventually transitioned to comfort care.   Assessment & Plan:   Principal Problem:   Chronic atrial fibrillation with RVR (HCC) Active Problems:   HTN (hypertension)   Hyperlipidemia   Acute pulmonary embolism (HCC)   Acute respiratory failure with hypoxia (HCC)   Lactic acid acidosis   AKI (acute kidney injury) (HCC)   Elevated troponin   Acute metabolic encephalopathy   CAD (coronary artery disease)   Protein-calorie malnutrition, severe  Atrial fibrillation with RVR: Came in with RVR which improved after IVF hydration.   He was found to have pulmonary embolism which could have been the inciting event. TSH 1.09.   Cardiology was consulted.  Previously not on anticoagulation due to age, anemia, dementia, frail status. Had few episodes of RVR.  There was also some concern for ventricular tachycardia but it was likely atrial fibrillation with aberrancy. He was given a dose of amiodarone. He converted to sinus rhythm. Continue with oral metoprolol. HR controlled now.   Hematuria: Patient has pulled his Foley catheter out on 1/22.  Developed hematuria.  Heparin was stopped.  Coude catheter had to be placed. Drop in hemoglobin noted for which he was transfused as mentioned below. Hemoglobin is stable.   He was transitioned to comfort care on 04/09/2022. Continue Foley catheter for now.   Macrocytic anemia /acute blood loss anemia: Baseline hemoglobin seems to be around 10-11.  Came in with hemoglobin of 9.   Drop in hemoglobin partly dilutional and partly due to hematuria. Hemoglobin dropped to 6.9.  He was given 1 unit of PRBC.  Hemoglobin responded and has remained stable.   Acute pulmonary embolism: Acute hypoxic respiratory failure: CT angiogram showed pulmonary embolism. Patient was started on heparin. No DVT noted on lower extremity Doppler studies. Echocardiogram shows LVEF to be 50 to 55%.  Right ventricular function was noted to be normal.  No significant valvular abnormalities noted.  Mild AS was noted. Patient was also empirically started on antibiotics at the time of admission but no infection identified.  Antibiotics were discontinued. Patient had significant hematuria after he pulled his Foley out.  Heparin had to be discontinued.  Continues to have bleeding through his catheter. This is a difficult situation. Due to his dementia and other comorbidities he is not a candidate for any kind of invasive workup. Palliative care consulted, eventually care transitioned to comfort care on 04/09/2022.   Hypotension, unspecified: Could have been due to hypovolemia as he did have acute kidney injury.   Perhaps has had poor oral intake since he has been weak for 2 weeks.  Blood pressure improved with fluid resuscitation. Lactic acid levels improved. Blood pressure is stable.   Acute kidney injury on CKD stage IIIa: As of 2022 his baseline creatinine was around 1.19.   Came in with creatinine of 1.81.  Was likely secondary to hypovolemia, now back to baseline.   Chronic abdominal pain, unspecified: [name] mentions longstanding abdominal pain.  He wonders if this could be the reason for patient's poor appetite. CT scan of the abdomen pelvis was done which showed nonspecific  findings.  Abdominal aneurysm was noted at 4.9 cm. Cholelithiasis and nephrolithiasis was noted.  LFTs were noted to be normal.  No other acute findings noted on CT scan.  His abdomen is benign.  Do not anticipate any further workup.  Patient seems to be asymptomatic for the most part.  His oral intake is poor but he does not have any nausea or vomiting.     Chronic systolic CHF: Stable.   History of dementia with some delirium: Continue Namenda.   Seizure disorder: Informed that patient was not on keppra.  Keppra was discontinued.     History of closed intertrochanteric fracture of the left hip with delayed healing Has seen orthopedics previously.  Last surgery was 10 years ago.  Up until 2 weeks ago patient was ambulatory in the house using a walker.   Goals of care Previous hospitalist/ Dr. [place] Pink discussed with patient's son and he agreed to transition to comfort care on 04/09/2022. Continue comfort measures.  Referral to hospice facility in [facility] pending  DVT prophylaxis: IV heparin on hold Code Status: DNR Family Communication: No family at bed side. Disposition Plan:   Care transitioned to comfort care.   Consultants:  Cardiology Palliative care  Procedures:Echocardiogram  Antimicrobials: Anti-infectives (From admission, onward)    Start     Dose/Rate Route Frequency Ordered Stop   04/05/22 2000  ceFEPIme (MAXIPIME) 2 g in sodium chloride 0.9 % 100 mL IVPB  Status:  Discontinued        2 g 200 mL/hr over 30 Minutes Intravenous Every 24 hours 04/04/22 2145 04/05/22 0915   04/04/22 2145  vancomycin variable dose per unstable renal function (pharmacist dosing)  Status:  Discontinued         Does not apply See admin instructions 04/04/22 2145 04/05/22 0915   04/04/22 2000  ceFEPIme (MAXIPIME) 2 g in sodium chloride 0.9 % 100 mL IVPB        2 g 200 mL/hr over 30 Minutes Intravenous  Once 04/04/22 1946 04/04/22 2058   04/04/22 2000  metroNIDAZOLE (FLAGYL) IVPB 500  mg        500 mg 100 mL/hr over 60 Minutes Intravenous  Once 04/04/22 1946 04/04/22 2249   04/04/22 2000  vancomycin (VANCOCIN) IVPB 1000 mg/200 mL premix  Status:  Discontinued        1,000 mg 200 mL/hr over 60 Minutes Intravenous  Once 04/04/22 1946 04/04/22 1949   04/04/22 2000  vancomycin (VANCOREADY) IVPB 1250 mg/250 mL        1,250 mg 166.7 mL/hr over 90 Minutes Intravenous  Once 04/04/22 1949 04/04/22 2250       Subjective: Patient was seen and examined at bedside.  Overnight events noted.   Patient is alert and oriented x 1, following commands.  He reports having pain in the right foot but better. He is awaiting discharge to hospice facility in [place].  Objective: Vitals:   04/13/22 0908 04/13/22 2243 04/14/22 0535 04/14/22 0752  BP: (!) 105/58 (!) 151/68 (!) 148/71 (!) 163/81  Pulse: 86 74 85 86  Resp: '16 20 16   '$ Temp: 99.2 F (37.3 C) 99.4 F (37.4 C) 97.9 F (36.6 C) 98.2 F (36.8 C)  TempSrc:  Axillary Axillary Oral Oral  SpO2: 95% 97% 96% (!) 87%  Weight:      Height:       No intake or output data in the 24 hours ending 04/14/22 1131  Filed Weights   04/04/22 2142 04/13/22 0500  Weight: 67.1 kg 73.7 kg    Examination:  General exam: Appears comfortable, not in any acute distress. Respiratory system: CTA bilaterally, respiratory effort normal, RR 15. Cardiovascular system: S1 & S2 heard, regular rate and rhythm, no murmur. Gastrointestinal system: Abdomen is soft, non tender, non distended, BS+ Central nervous system: Alert and oriented x 1. No focal neurological deficits. Extremities: No edema, no cyanosis, no clubbing. Skin: No rashes, lesions or ulcers Psychiatry: Judgement and insight appear normal. Mood & affect appropriate.     Data Reviewed: I have personally reviewed following labs and imaging studies  CBC: Recent Labs  Lab 04/07/22 1358 04/07/22 1956 04/08/22 0250 04/09/22 0204  WBC 4.8  --  6.9 5.4  HGB 6.9* 8.5* 8.9* 8.7*  HCT  21.7* 25.2* 26.5* 26.5*  MCV 112.4*  --  104.7* 106.4*  PLT 187  --  197 161   Basic Metabolic Panel: Recent Labs  Lab 04/08/22 0250 04/09/22 0204  NA 137 138  K 4.2 4.3  CL 112* 113*  CO2 20* 22  GLUCOSE 94 107*  BUN 50* 45*  CREATININE 1.50* 1.25*  CALCIUM 7.2* 7.2*   GFR: Estimated Creatinine Clearance: 38.9 mL/min (A) (by C-G formula based on SCr of 1.25 mg/dL (H)). Liver Function Tests: No results for input(s): "AST", "ALT", "ALKPHOS", "BILITOT", "PROT", "ALBUMIN" in the last 168 hours. No results for input(s): "LIPASE", "AMYLASE" in the last 168 hours. No results for input(s): "AMMONIA" in the last 168 hours. Coagulation Profile: No results for input(s): "INR", "PROTIME" in the last 168 hours. Cardiac Enzymes: No results for input(s): "CKTOTAL", "CKMB", "CKMBINDEX", "TROPONINI" in the last 168 hours. BNP (last 3 results) No results for input(s): "PROBNP" in the last 8760 hours. HbA1C: No results for input(s): "HGBA1C" in the last 72 hours. CBG: No results for input(s): "GLUCAP" in the last 168 hours.  Lipid Profile: No results for input(s): "CHOL", "HDL", "LDLCALC", "TRIG", "CHOLHDL", "LDLDIRECT" in the last 72 hours. Thyroid Function Tests: No results for input(s): "TSH", "T4TOTAL", "FREET4", "T3FREE", "THYROIDAB" in the last 72 hours. Anemia Panel: No results for input(s): "VITAMINB12", "FOLATE", "FERRITIN", "TIBC", "IRON", "RETICCTPCT" in the last 72 hours. Sepsis Labs: No results for input(s): "PROCALCITON", "LATICACIDVEN" in the last 168 hours.  Recent Results (from the past 240 hour(s))  Urine Culture     Status: None   Collection Time: 04/04/22  7:46 PM   Specimen: In/Out Cath Urine  Result Value Ref Range Status   Specimen Description IN/OUT CATH URINE  Final   Special Requests NONE  Final   Culture   Final    NO GROWTH Performed at [facility], [address] [place], [place] [address]    Report Status 04/06/2022 FINAL  Final  Blood  Culture (routine x 2)     Status: None   Collection Time: 04/04/22  8:00 PM   Specimen: BLOOD  Result Value Ref Range Status   Specimen Description BLOOD SITE NOT SPECIFIED  Final   Special Requests   Final    BOTTLES DRAWN AEROBIC AND ANAEROBIC Blood Culture adequate volume   Culture   Final    NO GROWTH 5 DAYS Performed at [facility], [address] [place],  [place] [address]    Report Status 04/09/2022 FINAL  Final  Resp panel by RT-PCR (RSV, Flu A&B, Covid) Anterior Nasal Swab     Status: None   Collection Time: 04/04/22  8:26 PM   Specimen: Anterior Nasal Swab  Result Value Ref Range Status   SARS Coronavirus 2 by RT PCR NEGATIVE NEGATIVE Final    Comment: (NOTE) SARS-CoV-2 target nucleic acids are NOT DETECTED.  The SARS-CoV-2 RNA is generally detectable in upper respiratory specimens during the acute phase of infection. The lowest concentration of SARS-CoV-2 viral copies this assay can detect is 138 copies/mL. A negative result does not preclude SARS-Cov-2 infection and should not be used as the sole basis for treatment or other patient management decisions. A negative result may occur with  improper specimen collection/handling, submission of specimen other than nasopharyngeal swab, presence of viral mutation(s) within the areas targeted by this assay, and inadequate number of viral copies(<138 copies/mL). A negative result must be combined with clinical observations, patient history, and epidemiological information. The expected result is Negative.  Fact Sheet for Patients:  EntrepreneurPulse.com.au  Fact Sheet for Healthcare Providers:  IncredibleEmployment.be  This test is no t yet approved or cleared by the [place] FDA and  has been authorized for detection and/or diagnosis of SARS-CoV-2 by FDA under an Emergency Use Authorization (EUA). This EUA will remain  in effect (meaning this test can be used) for the  duration of the COVID-19 declaration under Section 564(b)(1) of the Act, 21 U.S.C.section 360bbb-3(b)(1), unless the authorization is terminated  or revoked sooner.       Influenza A by PCR NEGATIVE NEGATIVE Final   Influenza B by PCR NEGATIVE NEGATIVE Final    Comment: (NOTE) The Xpert Xpress SARS-CoV-2/FLU/RSV plus assay is intended as an aid in the diagnosis of influenza from Nasopharyngeal swab specimens and should not be used as a sole basis for treatment. Nasal washings and aspirates are unacceptable for Xpert Xpress SARS-CoV-2/FLU/RSV testing.  Fact Sheet for Patients: EntrepreneurPulse.com.au  Fact Sheet for Healthcare Providers: IncredibleEmployment.be  This test is not yet approved or cleared by the [place] FDA and has been authorized for detection and/or diagnosis of SARS-CoV-2 by FDA under an Emergency Use Authorization (EUA). This EUA will remain in effect (meaning this test can be used) for the duration of the COVID-19 declaration under Section 564(b)(1) of the Act, 21 U.S.C. section 360bbb-3(b)(1), unless the authorization is terminated or revoked.     Resp Syncytial Virus by PCR NEGATIVE NEGATIVE Final    Comment: (NOTE) Fact Sheet for Patients: EntrepreneurPulse.com.au  Fact Sheet for Healthcare Providers: IncredibleEmployment.be  This test is not yet approved or cleared by the [place] FDA and has been authorized for detection and/or diagnosis of SARS-CoV-2 by FDA under an Emergency Use Authorization (EUA). This EUA will remain in effect (meaning this test can be used) for the duration of the COVID-19 declaration under Section 564(b)(1) of the Act, 21 U.S.C. section 360bbb-3(b)(1), unless the authorization is terminated or revoked.  Performed at [facility], [place] [address], [place], [place] [address]   Blood Culture (routine x 2)     Status: None   Collection  Time: 04/04/22  8:26 PM   Specimen: BLOOD  Result Value Ref Range Status   Specimen Description BLOOD SITE NOT SPECIFIED  Final   Special Requests   Final    BOTTLES DRAWN AEROBIC AND ANAEROBIC Blood Culture adequate volume   Culture   Final  NO GROWTH 5 DAYS Performed at [facility], [place] [address], [place], [place] [address]    Report Status 04/09/2022 FINAL  Final    Radiology Studies: No results found.  Scheduled Meds:  fentaNYL  1 patch Transdermal Q72H   metoprolol tartrate  12.5 mg Oral BID   OLANZapine  5 mg Oral QHS   tamsulosin  0.4 mg Oral Daily   Continuous Infusions:   LOS: 10 days    Time spent: 35 mins    Lian Tanori, MD [facility]   If 7PM-7AM, please contact night-coverage

## 2022-04-14 NOTE — Progress Notes (Signed)
Nutrition Brief Note  Chart reviewed. Pt now transitioning to comfort care.  No further nutrition interventions planned at this time.  Please re-consult as needed.   Argusta Mcgann, RD, LDN Clinical Dietitian RD pager # available in AMION  After hours/weekend pager # available in AMION   

## 2022-04-14 NOTE — Discharge Summary (Addendum)
Physician Discharge Summary  Tom Ortiz VOH:[id number] DOB: [birth year] DOA: 04/04/2022  PCP: Pcp, No  Admit date: 04/04/2022  Discharge date: 04/14/2022  Admitted From: Home Disposition:  Hospice facility in [place]  Recommendations for Outpatient Follow-up:  Follow up with PCP in 1-2 weeks Patient is being discharged to hospice facility  Home Health:None Equipment/Devices:None  Discharge Condition: Stable CODE STATUS:DNR Diet recommendation: Thin liquids  Brief/Interim Summary: This 89 year old Caucasian male with past medical history of dementia, hearing impairment, paroxysmal atrial fibrillation, not on anticoagulation who was brought into the emergency department after EMS was called due to history of weakness ongoing for about 2 weeks.  He was noted to be hypotensive and tachycardic when EMS evaluated him.  He was brought into the hospital for further management.  Found to be in rapid atrial fibrillation.  Blood pressure improved after fluid resuscitation.  He was found to have acute pulmonary embolism.  He was hospitalized for further management.  Eventually transitioned to comfort care. Patient is being discharged to hospice facility in [place].   Discharge Diagnoses:  Principal Problem:   Chronic atrial fibrillation with RVR (HCC) Active Problems:   HTN (hypertension)   Hyperlipidemia   Acute pulmonary embolism (HCC)   Acute respiratory failure with hypoxia (HCC)   Lactic acid acidosis   AKI (acute kidney injury) (HCC)   Elevated troponin   Acute metabolic encephalopathy   CAD (coronary artery disease)   Protein-calorie malnutrition, severe  Atrial fibrillation with RVR: Came in with RVR which improved after IVF hydration.   He was found to have pulmonary embolism which could have been the inciting event. TSH 1.09.   Cardiology was consulted.  Previously not on anticoagulation due to age, anemia, dementia, frail status. Had few episodes of RVR.  There was  also some concern for ventricular tachycardia but it was likely atrial fibrillation with aberrancy. He was given a dose of amiodarone. He converted to sinus rhythm. Continue with oral metoprolol. HR controlled now.   Hematuria: Patient has pulled his Foley catheter out on 1/22.  Developed hematuria.  Heparin was stopped.  Coude catheter had to be placed. Drop in hemoglobin noted for which he was transfused as mentioned below. Hemoglobin is stable.  He was transitioned to comfort care on 04/09/2022. Continue Foley catheter for now.   Macrocytic anemia /acute blood loss anemia: Baseline hemoglobin seems to be around 10-11.  Came in with hemoglobin of 9.   Drop in hemoglobin partly dilutional and partly due to hematuria. Hemoglobin dropped to 6.9.  He was given 1 unit of PRBC.  Hemoglobin responded and has remained stable.   Acute pulmonary embolism: Acute hypoxic respiratory failure: CT angiogram showed pulmonary embolism. Patient was started on heparin. No DVT noted on lower extremity Doppler studies. Echocardiogram shows LVEF to be 50 to 55%.  Right ventricular function was noted to be normal.  No significant valvular abnormalities noted.  Mild AS was noted. Patient was also empirically started on antibiotics at the time of admission but no infection identified.  Antibiotics were discontinued. Patient had significant hematuria after he pulled his Foley out.  Heparin had to be discontinued.  Continues to have bleeding through his catheter. This is a difficult situation. Due to his dementia and other comorbidities he is not a candidate for any kind of invasive workup. Palliative care consulted, eventually care transitioned to comfort care on 04/09/2022.   Hypotension, unspecified: Could have been due to hypovolemia as he did have acute kidney injury.  Perhaps has had poor oral intake since he has been weak for 2 weeks.  Blood pressure improved with fluid resuscitation. Lactic acid levels  improved. Blood pressure is stable.   Acute kidney injury on CKD stage IIIa: As of 2022 his baseline creatinine was around 1.19.   Came in with creatinine of 1.81.  Was likely secondary to hypovolemia, now back to baseline.   Chronic abdominal pain, unspecified: [name] mentions longstanding abdominal pain.  He wonders if this could be the reason for patient's poor appetite. CT scan of the abdomen pelvis was done which showed nonspecific findings.  Abdominal aneurysm was noted at 4.9 cm. Cholelithiasis and nephrolithiasis was noted.  LFTs were noted to be normal.  No other acute findings noted on CT scan.  His abdomen is benign.  Do not anticipate any further workup.  Patient seems to be asymptomatic for the most part.  His oral intake is poor but he does not have any nausea or vomiting.     Chronic systolic CHF: Stable.   History of dementia with some delirium: Continue Namenda.   Seizure disorder: Informed that patient was not on keppra.  Keppra was discontinued.     History of closed intertrochanteric fracture of the left hip with delayed healing Has seen orthopedics previously.  Last surgery was 10 years ago.  Up until 2 weeks ago patient was ambulatory in the house using a walker.   Goals of care Previous hospitalist/ Dr. [place] Pink discussed with patient's son and he agreed to transition to comfort care on 04/09/2022. Continue comfort measures.  Referral to hospice facility in [facility] pending  Discharge Instructions  Discharge Instructions     Diet - low sodium heart healthy   Complete by: As directed    Diet full liquid   Complete by: As directed    Discharge instructions   Complete by: As directed    Patient is being discharged to hospice facility   Increase activity slowly   Complete by: As directed       Allergies as of 04/14/2022       Reactions   Fosamax [alendronate] Other (See Comments)   Unknown   Vilazodone Hcl Nausea Only   Quinolones Hives, Rash    89 yr old allergy to quinine        Medication List     STOP taking these medications    Aspirin Low Dose 81 MG tablet Generic drug: aspirin EC   atorvastatin 40 MG tablet Commonly known as: LIPITOR   bismuth subsalicylate 045 WU/98JX suspension Commonly known as: PEPTO BISMOL   docusate sodium 100 MG capsule Commonly known as: COLACE   feeding supplement Liqd   isosorbide mononitrate 60 MG 24 hr tablet Commonly known as: IMDUR   lisinopril 5 MG tablet Commonly known as: ZESTRIL   metoprolol succinate 50 MG 24 hr tablet Commonly known as: TOPROL-XL   nitroGLYCERIN 0.4 MG SL tablet Commonly known as: NITROSTAT       TAKE these medications    acetaminophen 650 MG CR tablet Commonly known as: TYLENOL Take 650 mg by mouth as needed for pain.   glycopyrrolate 1 MG tablet Commonly known as: ROBINUL Take 1 tablet (1 mg total) by mouth every 4 (four) hours as needed (excessive secretions).   haloperidol 0.5 MG tablet Commonly known as: HALDOL Take 1 tablet (0.5 mg total) by mouth every 4 (four) hours as needed for agitation (or delirium).   OLANZapine 5 MG tablet Commonly known as:  ZYPREXA Take 1 tablet (5 mg total) by mouth at bedtime.        Follow-up Information     [facility] Follow up.   Contact information: [place] [id number] [phone]               Allergies  Allergen Reactions   Fosamax [Alendronate] Other (See Comments)    Unknown   Vilazodone Hcl Nausea Only   Quinolones Hives and Rash    89 yr old allergy to quinine    Consultations: Palliative care   Procedures/Studies: CT ABDOMEN PELVIS W CONTRAST  Result Date: 04/08/2022 CLINICAL DATA:  89 year old male with abdominal pain. Recent barium swallow. Bilateral lower lobe pulmonary emboli on CTA chest 04/04/2022. EXAM: CT ABDOMEN AND PELVIS WITH CONTRAST TECHNIQUE: Multidetector CT imaging of the abdomen and pelvis  was performed using the standard protocol following bolus administration of intravenous contrast. RADIATION DOSE REDUCTION: This exam was performed according to the departmental dose-optimization program which includes automated exposure control, adjustment of the mA and/or kV according to patient size and/or use of iterative reconstruction technique. CONTRAST:  53m OMNIPAQUE IOHEXOL 350 MG/ML SOLN COMPARISON:  CTA chest 04/04/2022. CT Abdomen and Pelvis 10/27/2004. report of lumbar radiographs 04/30/2016. FINDINGS: Lower chest: No pericardial effusion. Small combined layering and sub pulmonic pleural effusions at both lung bases. Fairly simple fluid density. Patchy visible lower lobe and right middle lobe airspace disease. Hepatobiliary: Cholelithiasis on series 3, image 22. No pericholecystic inflammation. Trace perihepatic free fluid. Otherwise negative liver. No bile duct enlargement. Pancreas: Partially atrophied. Spleen: Negative. Adrenals/Urinary Tract: Normal adrenal glands. Exophytic right renal upper pole cyst is chronic with simple fluid density and appears benign (no follow-up imaging recommended). Symmetric renal enhancement. Left nephrolithiasis is 3 mm. No hydronephrosis. Symmetric renal contrast excretion. No hydroureter. Bladder is decompressed by Foley catheter. Stomach/Bowel: Nonspecific presacral stranding with only mild retained stool in the rectum which is nondilated. Decompressed sigmoid colon. Mild diverticulosis in the proximal sigmoid and distal descending colon. Barium contrast has reached the descending colon with mild streak artifact. Ascending barium also. No dilated small bowel. Bilateral pericolic gutter stranding which is nonspecific. Appendix was diminutive in 2006 and is not well evaluated today due to regional barium contrast artifact. Terminal ileum appears decompressed. Stomach appears decompressed. Nonspecific 9 mm hyperdense material in the proximal duodenum might be retained  barium, uncertain series 3, image 30. No free air. No discrete free fluid in the abdomen, but there is layering free fluid at the pelvic inlet over the decompressed bladder which has mildly complex fluid density. Vascular/Lymphatic: Aortoiliac calcified atherosclerosis. Juxtarenal abdominal aortic aneurysm is up to 4.9 cm diameter, versus 3.2 cm in 2006. This tapers distally. Major arterial structures in the abdomen and pelvis remain patent. Portal venous system is patent. No lymphadenopathy identified. Reproductive: Foley catheter partially visible in the urethra. Other: Streak artifact in the pelvis, no definite pelvic free fluid. Musculoskeletal: Widespread lower thoracic and lumbar compression fractures. In the lumbar spine only L5 is spared. Underlying generalized osteopenia. In 2018 compression fractures of T11, T12, L2, and L4 were described. Sacrum and SI joints appear intact. Bilateral hip arthroplasty with some streak artifact. Chronic appearing left pubic rami fractures. IMPRESSION: 1. Nonspecific small volume of mildly complex free fluid layering at the pelvic inlet. Retained barium contrast in colon limits evaluation of the bowel, including the appendix. But there is no evidence of bowel obstruction or perforation at this time. 2. Juxtarenal Abdominal  Aortic Aneurysm, up to 4.9 cm. Recommend follow-up every 6 months and vascular consultation. Reference: J Am Coll Radiol 7253;66:440-347. Aortic Atherosclerosis (ICD10-I70.0). Aortic aneurysm NOS (ICD10-I71.9). 3. Generalized osteopenia with widespread lower thoracic and lumbar compression fractures. Fractures at L1 and L3 are age indeterminate. 4. Cholelithiasis and left nephrolithiasis. 5. Layering and sub pulmonic small pleural effusions at both lung bases. See also recent Chest CTA. Electronically Signed   By: Genevie Ann M.D.   On: 04/08/2022 06:54   DG Swallowing Func-Speech Pathology  Result Date: 04/05/2022 Table formatting from the original result  was not included. Images from the original result were not included. Objective Swallowing Evaluation: Type of Study: MBS-Modified Barium Swallow Study  Patient Details Name: KONNOR VONDRASEK MRN: [id number] Date of Birth: [birth year] Today's Date: 04/05/2022 Time: SLP Start Time (ACUTE ONLY): 1506 -SLP Stop Time (ACUTE ONLY): 5643 SLP Time Calculation (min) (ACUTE ONLY): 11 min Past Medical History: Past Medical History: Diagnosis Date  Anxiety   Arthritis   Essential hypertension 04/16/2014  Hard of hearing   tinnitus both ears  Hypertension   Paroxysmal atrial fibrillation ([place]) 04/28/2014  Shoulder pain, left 07/14/2016  states left rotator pain preop Past Surgical History: Past Surgical History: Procedure Laterality Date  APPENDECTOMY  yrs ago  Normal  yrs ago  HIP CLOSED REDUCTION  07/01/2011, left  Procedure: [place];  Surgeon: Magnus Sinning, MD;  Location: [facility];  Service: Orthopedics;  Laterality: Left;  HIP CLOSED REDUCTION  06/11/2011  Procedure: CLOSED MANIPULATION HIP;  Surgeon: Magnus Sinning, MD;  Location: [facility];  Service: Orthopedics;  Laterality: Left;  HIP CLOSED REDUCTION  07/01/2011  Procedure: CLOSED MANIPULATION HIP;  Surgeon: Magnus Sinning, MD;  Location: [facility];  Service: Orthopedics;  Laterality: Left;  HIP CLOSED REDUCTION  12/08/2011  Procedure: CLOSED MANIPULATION HIP;  Surgeon: Sydnee Cabal, MD;  Location: [facility];  Service: Orthopedics;  Laterality: Left;  JOINT REPLACEMENT  2007 right, left 17 yrs ago  Bilateral hip  NM MYOVIEW LTD  2012  ORIF PERIPROSTHETIC FRACTURE Left 07/14/2016  Procedure: OPEN REDUCTION INTERNAL FIXATION (ORIF) PERIPROSTHETIC FRACTURE LEFT HIP;  Surgeon: Paralee Cancel, MD;  Location: [facility];  Service: Orthopedics;  Laterality: Left;  TONSILLECTOMY  as child  TOTAL HIP REVISION  10/02/2011  Procedure: TOTAL HIP REVISION;  Surgeon: Gearlean Alf, MD;  Location: [facility];  Service: Orthopedics;  Laterality: Right;  TOTAL HIP REVISION  01/27/2012   Procedure: TOTAL HIP REVISION;  Surgeon: Gearlean Alf, MD;  Location: [facility];  Service: Orthopedics;  Laterality: Left; HPI: Mr. Novosad is a 89 year old Caucasian male who was brought into the emergency department after EMS was called due to history of weakness ongoing for about 2 weeks.  Found to be in rapid a-fib and to have acute pulmonary embolism. CXR and Chest CT 1/20 with "Patchy atelectasis or infiltrate at the left lung base." Head CT 1/20 with no acute findings. Pt with past medical history of dementia, hearing impairment, paroxysmal atrial fibrillation not on anticoagulation.  Subjective: poor positioning  Recommendations for follow up therapy are one component of a multi-disciplinary discharge planning process, led by the attending physician.  Recommendations may be updated based on patient status, additional functional criteria and insurance authorization. Assessment / Plan / Recommendation   04/05/2022   3:54 PM Clinical Impressions Clinical Impression Pt presents with a mild-moderate oropharyngeal dysphagia c/b lingual discoordination, delayed swallow initiation, incomplete laryngeal closure, seemingely reduced UES opening, and diminished sensation.  Pt also with very poor positioning which appears to impact swallow function.  SLP had assist pt to hold his head up for PO trials during MBS. These deficits resulted in silent aspiration of thin liquid before the swallow.  Cup presentation was not reliably beneficial to prevent aspiration.  Pt was unable to clear aspiration and did not follow instructions for cued cough.  With nectar thick liquid there was premature spillage to the pyriform sinuses but no penetration or aspiration with both cup and straw presentation.  Pt was unable to siphon honey thick liquid by straw.  There was no penetration of hone thick liquid by cup.  There was prolonged oral phase with reduced bolus cohesion.  Pt did not initiate swallow until bolus had fully transited from  oral cavity with stasis of portions of bolus in pharyngeal stasis rather than use of piecemeal degultition.  With puree and regular solid texture there was no penetration or aspiration.  With solid texture in particular there was increased retention of contrast at UES, but no backflow to pharyx.  There was diffuse pharyngeal residue with all consistencies.  Pt is not a good candidate for swallowing therapy 2/2 inability to follow directions consistently. Recommend mehcanical soft diet with nectar thick liquids. SLP Visit Diagnosis Dysphagia, oropharyngeal phase (R13.12) Impact on safety and function Moderate aspiration risk     04/05/2022   3:54 PM Treatment Recommendations Treatment Recommendations Therapy as outlined in treatment plan below     04/05/2022   4:02 PM Prognosis Prognosis for Safe Diet Advancement Fair   04/05/2022   3:54 PM Diet Recommendations SLP Diet Recommendations Dysphagia 3 (Mech soft) solids;Nectar thick liquid Liquid Administration via Cup;Straw Medication Administration Crushed with puree Compensations Slow rate;Small sips/bites;assist with positioning as needed Postural Changes Seated upright at 90 degrees     04/05/2022   3:54 PM Other Recommendations Oral Care Recommendations Oral care BID Other Recommendations Order thickener from pharmacy Follow Up Recommendations Skilled nursing-short term rehab (<3 hours/day) Functional Status Assessment Patient has had a recent decline in their functional status and demonstrates the ability to make significant improvements in function in a reasonable and predictable amount of time.   04/05/2022   3:54 PM Frequency and Duration  Speech Therapy Frequency (ACUTE ONLY) min 2x/week Treatment Duration 2 weeks     04/05/2022   3:44 PM Oral Phase Oral Phase Impaired Oral - Honey Cup Decreased bolus cohesion;Delayed oral transit;Lingual pumping Oral - Nectar Cup Premature spillage;Decreased bolus cohesion Oral - Nectar Straw Premature spillage;Decreased bolus  cohesion Oral - Thin Cup Premature spillage;Decreased bolus cohesion Oral - Thin Straw Premature spillage Oral - Puree Premature spillage;Delayed oral transit Oral - Regular Impaired mastication;Delayed oral transit;Premature spillage;Decreased bolus cohesion    04/05/2022   3:53 PM Pharyngeal Phase Pharyngeal Material does not enter airway Pharyngeal- Nectar Cup Delayed swallow initiation-pyriform sinuses Pharyngeal Material does not enter airway Pharyngeal- Nectar Straw Delayed swallow initiation-pyriform sinuses Pharyngeal Material does not enter airway Pharyngeal- Thin Cup Delayed swallow initiation-pyriform sinuses;Reduced airway/laryngeal closure;Penetration/Aspiration before swallow Pharyngeal Material enters airway, passes BELOW cords without attempt by patient to eject out (silent aspiration) Pharyngeal- Thin Straw Delayed swallow initiation-pyriform sinuses;Reduced airway/laryngeal closure;Penetration/Aspiration before swallow Pharyngeal Material enters airway, passes BELOW cords without attempt by patient to eject out (silent aspiration) Pharyngeal- Puree Delayed swallow initiation-vallecula;Pharyngeal residue - cp segment Pharyngeal Material does not enter airway Pharyngeal- Regular Delayed swallow initiation-vallecula;Pharyngeal residue - cp segment Pharyngeal Material does not enter airway     No data to display  Celedonio Savage, MA, [place] Office: [phone] 04/05/2022, 4:06 PM                     ECHOCARDIOGRAM COMPLETE  Result Date: 04/05/2022    ECHOCARDIOGRAM REPORT   Patient Name:   VITO BEG Date of Exam: 04/05/2022 Medical Rec #:  [id number]        Height:       70.0 in Accession #:    [id number]       Weight:       147.9 lb Date of Birth:  30-Jan-1928         BSA:          1.836 m Patient Age:    58 years         BP:           101/52 mmHg Patient Gender: M                HR:           70 bpm. Exam Location:  Inpatient Procedure: 2D Echo Indications:     atrial fibrillation  History:        Patient has prior history of Echocardiogram examinations, most                 recent 06/30/2020. CHF; Risk Factors:Hypertension and                 Dyslipidemia.  Sonographer:    Harvie Junior Referring Phys: [phone] DEBBY CROSLEY  Sonographer Comments: Technically difficult study due to poor echo windows and no subcostal window. Image acquisition challenging due to uncooperative patient and Image acquisition challenging due to respiratory motion. IMPRESSIONS  1. Technically difficult study. Left ventricular ejection fraction, by estimation, is 50 to 55%. The left ventricle has low normal function. Left ventricular endocardial border not optimally defined to evaluate regional wall motion. There is mild left ventricular hypertrophy. Left ventricular diastolic parameters are indeterminate.  2. Right ventricular systolic function is normal. The right ventricular size is normal.  3. The mitral valve is grossly normal. Trivial mitral valve regurgitation. No evidence of mitral stenosis.  4. The aortic valve was not well visualized. Aortic valve regurgitation is trivial. Mild aortic valve stenosis. FINDINGS  Left Ventricle: Left ventricular ejection fraction, by estimation, is 50 to 55%. The left ventricle has low normal function. Left ventricular endocardial border not optimally defined to evaluate regional wall motion. The left ventricular internal cavity  size was small. There is mild left ventricular hypertrophy. Left ventricular diastolic parameters are indeterminate. Right Ventricle: The right ventricular size is normal. Right vetricular wall thickness was not well visualized. Right ventricular systolic function is normal. The tricuspid regurgitant velocity is 2.15 m/s, and with an assumed right atrial pressure of 3 mmHg, the estimated right ventricular systolic pressure is 61.4 mmHg. Left Atrium: Left atrial size was normal in size. Right Atrium: Right atrial size was normal in size.  Pericardium: Trivial pericardial effusion is present. Presence of epicardial fat layer. Mitral Valve: The mitral valve is grossly normal. Trivial mitral valve regurgitation. No evidence of mitral valve stenosis. Tricuspid Valve: The tricuspid valve is not well visualized. Tricuspid valve regurgitation is not demonstrated. Aortic Valve: The aortic valve was not well visualized. Aortic valve regurgitation is trivial. Aortic regurgitation PHT measures 296 msec. Mild aortic stenosis is present. Aortic valve mean gradient measures 7.4 mmHg. Aortic valve peak gradient measures 14.5 mmHg. Aortic valve area, by VTI  measures 1.39 cm. Pulmonic Valve: The pulmonic valve was not well visualized. Pulmonic valve regurgitation is not visualized. Aorta: The aortic root is normal in size and structure. IAS/Shunts: The interatrial septum was not well visualized.  LEFT VENTRICLE PLAX 2D LVIDd:         3.20 cm     Diastology LVIDs:         2.00 cm     LV e' medial:    6.31 cm/s LV PW:         1.10 cm     LV E/e' medial:  13.1 LV IVS:        1.20 cm     LV e' lateral:   7.40 cm/s LVOT diam:     2.10 cm     LV E/e' lateral: 11.2 LV SV:         50 LV SV Index:   27 LVOT Area:     3.46 cm                             3D Volume EF: LV Volumes (MOD)           3D EF:        46 % LV vol d, MOD A2C: 82.7 ml LV EDV:       109 ml LV vol d, MOD A4C: 68.6 ml LV ESV:       59 ml LV vol s, MOD A2C: 36.9 ml LV SV:        50 ml LV vol s, MOD A4C: 31.3 ml LV SV MOD A2C:     45.8 ml LV SV MOD A4C:     68.6 ml LV SV MOD BP:      41.0 ml RIGHT VENTRICLE RV Basal diam:  3.20 cm RV Mid diam:    3.10 cm RV S prime:     13.37 cm/s TAPSE (M-mode): 1.7 cm LEFT ATRIUM             Index        RIGHT ATRIUM           Index LA Vol (A2C):   48.7 ml 26.52 ml/m  RA Area:     11.90 cm LA Vol (A4C):   48.9 ml 26.63 ml/m  RA Volume:   22.40 ml  12.20 ml/m LA Biplane Vol: 49.7 ml 27.07 ml/m  AORTIC VALVE                     PULMONIC VALVE AV Area (Vmax):    1.40 cm       PV Vmax:       0.92 m/s AV Area (Vmean):   1.43 cm      PV Peak grad:  3.4 mmHg AV Area (VTI):     1.39 cm AV Vmax:           190.40 cm/s AV Vmean:          128.000 cm/s AV VTI:            0.360 m AV Peak Grad:      14.5 mmHg AV Mean Grad:      7.4 mmHg LVOT Vmax:         77.20 cm/s LVOT Vmean:        52.750 cm/s LVOT VTI:          0.145 m LVOT/AV VTI ratio: 0.40 AI PHT:  296 msec  AORTA Ao Root diam: 3.80 cm MITRAL VALVE               TRICUSPID VALVE MV Area (PHT): 3.42 cm    TR Peak grad:   18.5 mmHg MV Decel Time: 222 msec    TR Vmax:        215.00 cm/s MR Peak grad: 27.0 mmHg MR Vmax:      260.00 cm/s  SHUNTS MV E velocity: 82.70 cm/s  Systemic VTI:  0.14 m MV A velocity: 58.50 cm/s  Systemic Diam: 2.10 cm MV E/A ratio:  1.41 Oswaldo Milian MD Electronically signed by Oswaldo Milian MD Signature Date/Time: 04/05/2022/2:16:29 PM    Final    VAS [place] LOWER EXTREMITY VENOUS (DVT)  Result Date: 04/05/2022  Lower Venous DVT Study Patient Name:  DORRIAN DOGGETT [facility]  Date of Exam:   04/05/2022 Medical Rec #: [id number]         Accession #:    [id number] Date of Birth: April 17, 1927          Patient Gender: M Patient Age:   89 years Exam Location:  [facility] Procedure:      VAS [place] LOWER EXTREMITY VENOUS (DVT) Referring Phys: DEBBY CROSLEY --------------------------------------------------------------------------------  Indications: Pulmonary embolism.  Comparison Study: no prior Performing Technologist: Archie Patten RVS  Examination Guidelines: A complete evaluation includes B-mode imaging, spectral Doppler, color Doppler, and power Doppler as needed of all accessible portions of each vessel. Bilateral testing is considered an integral part of a complete examination. Limited examinations for reoccurring indications may be performed as noted. The reflux portion of the exam is performed with the patient in reverse Trendelenburg.   +---------+---------------+---------+-----------+----------+--------------+ RIGHT    CompressibilityPhasicitySpontaneityPropertiesThrombus Aging +---------+---------------+---------+-----------+----------+--------------+ CFV      Full           Yes      Yes                                 +---------+---------------+---------+-----------+----------+--------------+ SFJ      Full                                                        +---------+---------------+---------+-----------+----------+--------------+ FV Prox  Full                                                        +---------+---------------+---------+-----------+----------+--------------+ FV Mid   Full                                                        +---------+---------------+---------+-----------+----------+--------------+ FV DistalFull           Yes      Yes                                 +---------+---------------+---------+-----------+----------+--------------+ PFV      Full                                                        +---------+---------------+---------+-----------+----------+--------------+  POP      Full           Yes      Yes                                 +---------+---------------+---------+-----------+----------+--------------+ PTV      Full           Yes      Yes                                 +---------+---------------+---------+-----------+----------+--------------+ PERO     Full           Yes      Yes                                 +---------+---------------+---------+-----------+----------+--------------+   +---------+---------------+---------+-----------+----------+--------------+ LEFT     CompressibilityPhasicitySpontaneityPropertiesThrombus Aging +---------+---------------+---------+-----------+----------+--------------+ CFV      Full           Yes      Yes                                  +---------+---------------+---------+-----------+----------+--------------+ SFJ      Full                                                        +---------+---------------+---------+-----------+----------+--------------+ FV Prox  Full                                                        +---------+---------------+---------+-----------+----------+--------------+ FV Mid   Full                                                        +---------+---------------+---------+-----------+----------+--------------+ FV DistalFull                                                        +---------+---------------+---------+-----------+----------+--------------+ PFV      Full                                                        +---------+---------------+---------+-----------+----------+--------------+ POP      Full           Yes      Yes                                 +---------+---------------+---------+-----------+----------+--------------+  PTV      Full                                                        +---------+---------------+---------+-----------+----------+--------------+ PERO     Full                                                        +---------+---------------+---------+-----------+----------+--------------+     Summary: BILATERAL: - No evidence of deep vein thrombosis seen in the lower extremities, bilaterally. -No evidence of popliteal cyst, bilaterally.   *See table(s) above for measurements and observations. Electronically signed by Harold Barban MD on 04/05/2022 at 2:13:44 PM.    Final    CT Angio Chest PE W and/or Wo Contrast  Addendum Date: 04/04/2022   ADDENDUM REPORT: 04/04/2022 22:44 ADDENDUM: Critical Value/emergent results were called by telephone at the time of interpretation on 04/04/2022 at 10:44 pm to provider DAVID YAO , who verbally acknowledged these results. Electronically Signed   By: Brett Fairy M.D.   On: 04/04/2022 22:44    Result Date: 04/04/2022 CLINICAL DATA:  Pulmonary embolism suspected, high probability. Hypotension, stroke suspected. EXAM: CT ANGIOGRAPHY CHEST WITH CONTRAST TECHNIQUE: Multidetector CT imaging of the chest was performed using the standard protocol during bolus administration of intravenous contrast. Multiplanar CT image reconstructions and MIPs were obtained to evaluate the vascular anatomy. RADIATION DOSE REDUCTION: This exam was performed according to the departmental dose-optimization program which includes automated exposure control, adjustment of the mA and/or kV according to patient size and/or use of iterative reconstruction technique. CONTRAST:  45m OMNIPAQUE IOHEXOL 350 MG/ML SOLN COMPARISON:  None Available. FINDINGS: Cardiovascular: The heart is mildly enlarged and there is no pericardial effusion. Multi-vessel coronary artery calcifications are seen. There is atherosclerotic calcification of the aorta without evidence of aneurysm. The pulmonary trunk is normal in caliber. Small lobar, segmental, and subsegmental pulmonary artery filling defects in the left lower lobe and segmental and subsegmental pulmonary emboli in the right lower lobe. No evidence of right heart strain. Mediastinum/Nodes: No mediastinal, hilar, or axillary lymphadenopathy. The thyroid gland, trachea, and esophagus are within normal limits. Lungs/Pleura: There is a trace left pleural effusion. Bronchial wall thickening is noted bilaterally with patchy atelectasis or infiltrate at the lung bases. No pneumothorax Upper Abdomen: Stones are present within the gallbladder. There is a cyst in the right kidney. No acute abnormality. Musculoskeletal: There are compression deformities at T6, T8, T11, and T12, indeterminate in age. Degenerative changes are present in the thoracic spine. Review of the MIP images confirms the above findings. IMPRESSION: 1. Bilateral lower lobe pulmonary emboli. No evidence of right heart strain. 2. Patchy  atelectasis or infiltrate at the lung bases. 3. Trace left pleural effusion. 4. Cholelithiasis. 5. Multilevel compression deformities in the thoracic spine, indeterminate in age. 6. Coronary artery calcifications. 7. Aortic atherosclerosis. Electronically Signed: By: LBrett FairyM.D. On: 04/04/2022 22:41   CT HEAD WO CONTRAST (5MM)  Result Date: 04/04/2022 CLINICAL DATA:  Acute neurologic deficit EXAM: CT HEAD WITHOUT CONTRAST TECHNIQUE: Contiguous axial images were obtained from the base of the skull through the vertex without intravenous contrast. RADIATION DOSE REDUCTION: This  exam was performed according to the departmental dose-optimization program which includes automated exposure control, adjustment of the mA and/or kV according to patient size and/or use of iterative reconstruction technique. COMPARISON:  None Available. FINDINGS: Brain: There is no mass, hemorrhage or extra-axial collection. There is generalized atrophy without lobar predilection. Hypodensity of the white matter is most commonly associated with chronic microvascular disease. Vascular: Atherosclerotic calcification of the vertebral and internal carotid arteries at the skull base. No abnormal hyperdensity of the major intracranial arteries or dural venous sinuses. Skull: The visualized skull base, calvarium and extracranial soft tissues are normal. Sinuses/Orbits: No fluid levels or advanced mucosal thickening of the visualized paranasal sinuses. No mastoid or middle ear effusion. The orbits are normal. IMPRESSION: 1. No acute intracranial abnormality. 2. Generalized atrophy and findings of chronic microvascular disease. Electronically Signed   By: Ulyses Jarred M.D.   On: 04/04/2022 22:36   DG Chest Port 1 View  Result Date: 04/04/2022 CLINICAL DATA:  Possible sepsis.  Weakness for 2 weeks. EXAM: PORTABLE CHEST 1 VIEW COMPARISON:  07/11/2016. FINDINGS: The heart size and mediastinal contours are stable. There is atherosclerotic  calcification of the aorta. Lung volumes are low. There is patchy airspace disease at the left lung base. No effusion or pneumothorax. A stable compression deformity is present in the mid thoracic spine. No acute osseous abnormality. IMPRESSION: Patchy atelectasis or infiltrate at the left lung base. Electronically Signed   By: Brett Fairy M.D.   On: 04/04/2022 20:08     Subjective: Patient is being discharged to hospice facility in [place].   Discharge Exam: Vitals:   04/14/22 0752 04/14/22 1550  BP: (!) 163/81 112/72  Pulse: 86 88  Resp:    Temp: 98.2 F (36.8 C) 98.4 F (36.9 C)  SpO2: (!) 87% 97%   Vitals:   04/13/22 2243 04/14/22 0535 04/14/22 0752 04/14/22 1550  BP: (!) 151/68 (!) 148/71 (!) 163/81 112/72  Pulse: 74 85 86 88  Resp: 20 16    Temp: 99.4 F (37.4 C) 97.9 F (36.6 C) 98.2 F (36.8 C) 98.4 F (36.9 C)  TempSrc: Axillary Oral Oral Oral  SpO2: 97% 96% (!) 87% 97%  Weight:      Height:        General: Pt is alert, awake, not in acute distress Cardiovascular: RRR, S1/S2 +, no rubs, no gallops Respiratory: CTA bilaterally, no wheezing, no rhonchi Abdominal: Soft, NT, ND, bowel sounds + Extremities: no edema, no cyanosis    The results of significant diagnostics from this hospitalization (including imaging, microbiology, ancillary and laboratory) are listed below for reference.     Microbiology: Recent Results (from the past 240 hour(s))  Urine Culture     Status: None   Collection Time: 04/04/22  7:46 PM   Specimen: In/Out Cath Urine  Result Value Ref Range Status   Specimen Description IN/OUT CATH URINE  Final   Special Requests NONE  Final   Culture   Final    NO GROWTH Performed at [facility], [address], [place], [place] [address]    Report Status 04/06/2022 FINAL  Final  Blood Culture (routine x 2)     Status: None   Collection Time: 04/04/22  8:00 PM   Specimen: BLOOD  Result Value Ref Range Status   Specimen Description  BLOOD SITE NOT SPECIFIED  Final   Special Requests   Final    BOTTLES DRAWN AEROBIC AND ANAEROBIC Blood Culture adequate volume  Culture   Final    NO GROWTH 5 DAYS Performed at [facility], [place] [address], [place], [place] [address]    Report Status 04/09/2022 FINAL  Final  Resp panel by RT-PCR (RSV, Flu A&B, Covid) Anterior Nasal Swab     Status: None   Collection Time: 04/04/22  8:26 PM   Specimen: Anterior Nasal Swab  Result Value Ref Range Status   SARS Coronavirus 2 by RT PCR NEGATIVE NEGATIVE Final    Comment: (NOTE) SARS-CoV-2 target nucleic acids are NOT DETECTED.  The SARS-CoV-2 RNA is generally detectable in upper respiratory specimens during the acute phase of infection. The lowest concentration of SARS-CoV-2 viral copies this assay can detect is 138 copies/mL. A negative result does not preclude SARS-Cov-2 infection and should not be used as the sole basis for treatment or other patient management decisions. A negative result may occur with  improper specimen collection/handling, submission of specimen other than nasopharyngeal swab, presence of viral mutation(s) within the areas targeted by this assay, and inadequate number of viral copies(<138 copies/mL). A negative result must be combined with clinical observations, patient history, and epidemiological information. The expected result is Negative.  Fact Sheet for Patients:  EntrepreneurPulse.com.au  Fact Sheet for Healthcare Providers:  IncredibleEmployment.be  This test is no t yet approved or cleared by the [place] FDA and  has been authorized for detection and/or diagnosis of SARS-CoV-2 by FDA under an Emergency Use Authorization (EUA). This EUA will remain  in effect (meaning this test can be used) for the duration of the COVID-19 declaration under Section 564(b)(1) of the Act, 21 U.S.C.section 360bbb-3(b)(1), unless the authorization is terminated  or  revoked sooner.       Influenza A by PCR NEGATIVE NEGATIVE Final   Influenza B by PCR NEGATIVE NEGATIVE Final    Comment: (NOTE) The Xpert Xpress SARS-CoV-2/FLU/RSV plus assay is intended as an aid in the diagnosis of influenza from Nasopharyngeal swab specimens and should not be used as a sole basis for treatment. Nasal washings and aspirates are unacceptable for Xpert Xpress SARS-CoV-2/FLU/RSV testing.  Fact Sheet for Patients: EntrepreneurPulse.com.au  Fact Sheet for Healthcare Providers: IncredibleEmployment.be  This test is not yet approved or cleared by the [place] FDA and has been authorized for detection and/or diagnosis of SARS-CoV-2 by FDA under an Emergency Use Authorization (EUA). This EUA will remain in effect (meaning this test can be used) for the duration of the COVID-19 declaration under Section 564(b)(1) of the Act, 21 U.S.C. section 360bbb-3(b)(1), unless the authorization is terminated or revoked.     Resp Syncytial Virus by PCR NEGATIVE NEGATIVE Final    Comment: (NOTE) Fact Sheet for Patients: EntrepreneurPulse.com.au  Fact Sheet for Healthcare Providers: IncredibleEmployment.be  This test is not yet approved or cleared by the [place] FDA and has been authorized for detection and/or diagnosis of SARS-CoV-2 by FDA under an Emergency Use Authorization (EUA). This EUA will remain in effect (meaning this test can be used) for the duration of the COVID-19 declaration under Section 564(b)(1) of the Act, 21 U.S.C. section 360bbb-3(b)(1), unless the authorization is terminated or revoked.  Performed at [facility], [place] [address] [place], [place] [address]   Blood Culture (routine x 2)     Status: None   Collection Time: 04/04/22  8:26 PM   Specimen: BLOOD  Result Value Ref Range Status   Specimen Description BLOOD SITE NOT SPECIFIED  Final   Special Requests  Final    BOTTLES DRAWN AEROBIC AND ANAEROBIC Blood Culture adequate volume   Culture   Final    NO GROWTH 5 DAYS Performed at [facility], [place] [address] [place], [place] [address]    Report Status 04/09/2022 FINAL  Final     Labs: BNP (last 3 results) No results for input(s): "BNP" in the last 8760 hours. Basic Metabolic Panel: Recent Labs  Lab 04/08/22 0250 04/09/22 0204  NA 137 138  K 4.2 4.3  CL 112* 113*  CO2 20* 22  GLUCOSE 94 107*  BUN 50* 45*  CREATININE 1.50* 1.25*  CALCIUM 7.2* 7.2*   Liver Function Tests: No results for input(s): "AST", "ALT", "ALKPHOS", "BILITOT", "PROT", "ALBUMIN" in the last 168 hours. No results for input(s): "LIPASE", "AMYLASE" in the last 168 hours. No results for input(s): "AMMONIA" in the last 168 hours. CBC: Recent Labs  Lab 04/07/22 1956 04/08/22 0250 04/09/22 0204  WBC  --  6.9 5.4  HGB 8.5* 8.9* 8.7*  HCT 25.2* 26.5* 26.5*  MCV  --  104.7* 106.4*  PLT  --  197 190   Cardiac Enzymes: No results for input(s): "CKTOTAL", "CKMB", "CKMBINDEX", "TROPONINI" in the last 168 hours. BNP: Invalid input(s): "POCBNP" CBG: No results for input(s): "GLUCAP" in the last 168 hours. D-Dimer No results for input(s): "DDIMER" in the last 72 hours. Hgb A1c No results for input(s): "HGBA1C" in the last 72 hours. Lipid Profile No results for input(s): "CHOL", "HDL", "LDLCALC", "TRIG", "CHOLHDL", "LDLDIRECT" in the last 72 hours. Thyroid function studies No results for input(s): "TSH", "T4TOTAL", "T3FREE", "THYROIDAB" in the last 72 hours.  Invalid input(s): "FREET3" Anemia work up No results for input(s): "VITAMINB12", "FOLATE", "FERRITIN", "TIBC", "IRON", "RETICCTPCT" in the last 72 hours. Urinalysis    Component Value Date/Time   COLORURINE AMBER (A) 04/04/2022 1946   APPEARANCEUR CLOUDY (A) 04/04/2022 1946   APPEARANCEUR Turbid (A) 08/21/2020 0941   LABSPEC 1.020 04/04/2022 1946   PHURINE 5.0 04/04/2022 1946   GLUCOSEU  NEGATIVE 04/04/2022 1946   HGBUR SMALL (A) 04/04/2022 1946   BILIRUBINUR NEGATIVE 04/04/2022 1946   BILIRUBINUR Negative 08/21/2020 [place] 04/04/2022 1946   PROTEINUR >=300 (A) 04/04/2022 1946   UROBILINOGEN 0.2 01/22/2012 1216   NITRITE NEGATIVE 04/04/2022 1946   LEUKOCYTESUR NEGATIVE 04/04/2022 1946   Sepsis Labs Recent Labs  Lab 04/08/22 0250 04/09/22 0204  WBC 6.9 5.4   Microbiology Recent Results (from the past 240 hour(s))  Urine Culture     Status: None   Collection Time: 04/04/22  7:46 PM   Specimen: In/Out Cath Urine  Result Value Ref Range Status   Specimen Description IN/OUT CATH URINE  Final   Special Requests NONE  Final   Culture   Final    NO GROWTH Performed at [facility], [place] [address] [place], [place] [address]    Report Status 04/06/2022 FINAL  Final  Blood Culture (routine x 2)     Status: None   Collection Time: 04/04/22  8:00 PM   Specimen: BLOOD  Result Value Ref Range Status   Specimen Description BLOOD SITE NOT SPECIFIED  Final   Special Requests   Final    BOTTLES DRAWN AEROBIC AND ANAEROBIC Blood Culture adequate volume   Culture   Final    NO GROWTH 5 DAYS Performed at [facility], [place] [address] [place], [place] [address]    Report Status 04/09/2022 FINAL  Final  Resp  panel by RT-PCR (RSV, Flu A&B, Covid) Anterior Nasal Swab     Status: None   Collection Time: 04/04/22  8:26 PM   Specimen: Anterior Nasal Swab  Result Value Ref Range Status   SARS Coronavirus 2 by RT PCR NEGATIVE NEGATIVE Final    Comment: (NOTE) SARS-CoV-2 target nucleic acids are NOT DETECTED.  The SARS-CoV-2 RNA is generally detectable in upper respiratory specimens during the acute phase of infection. The lowest concentration of SARS-CoV-2 viral copies this assay can detect is 138 copies/mL. A negative result does not preclude SARS-Cov-2 infection and should not be used as the sole basis for treatment or other patient  management decisions. A negative result may occur with  improper specimen collection/handling, submission of specimen other than nasopharyngeal swab, presence of viral mutation(s) within the areas targeted by this assay, and inadequate number of viral copies(<138 copies/mL). A negative result must be combined with clinical observations, patient history, and epidemiological information. The expected result is Negative.  Fact Sheet for Patients:  EntrepreneurPulse.com.au  Fact Sheet for Healthcare Providers:  IncredibleEmployment.be  This test is no t yet approved or cleared by the [place] FDA and  has been authorized for detection and/or diagnosis of SARS-CoV-2 by FDA under an Emergency Use Authorization (EUA). This EUA will remain  in effect (meaning this test can be used) for the duration of the COVID-19 declaration under Section 564(b)(1) of the Act, 21 U.S.C.section 360bbb-3(b)(1), unless the authorization is terminated  or revoked sooner.       Influenza A by PCR NEGATIVE NEGATIVE Final   Influenza B by PCR NEGATIVE NEGATIVE Final    Comment: (NOTE) The Xpert Xpress SARS-CoV-2/FLU/RSV plus assay is intended as an aid in the diagnosis of influenza from Nasopharyngeal swab specimens and should not be used as a sole basis for treatment. Nasal washings and aspirates are unacceptable for Xpert Xpress SARS-CoV-2/FLU/RSV testing.  Fact Sheet for Patients: EntrepreneurPulse.com.au  Fact Sheet for Healthcare Providers: IncredibleEmployment.be  This test is not yet approved or cleared by the [place] FDA and has been authorized for detection and/or diagnosis of SARS-CoV-2 by FDA under an Emergency Use Authorization (EUA). This EUA will remain in effect (meaning this test can be used) for the duration of the COVID-19 declaration under Section 564(b)(1) of the Act, 21 U.S.C. section 360bbb-3(b)(1),  unless the authorization is terminated or revoked.     Resp Syncytial Virus by PCR NEGATIVE NEGATIVE Final    Comment: (NOTE) Fact Sheet for Patients: EntrepreneurPulse.com.au  Fact Sheet for Healthcare Providers: IncredibleEmployment.be  This test is not yet approved or cleared by the [place] FDA and has been authorized for detection and/or diagnosis of SARS-CoV-2 by FDA under an Emergency Use Authorization (EUA). This EUA will remain in effect (meaning this test can be used) for the duration of the COVID-19 declaration under Section 564(b)(1) of the Act, 21 U.S.C. section 360bbb-3(b)(1), unless the authorization is terminated or revoked.  Performed at [facility], [place] [address], [place], [place] [address]   Blood Culture (routine x 2)     Status: None   Collection Time: 04/04/22  8:26 PM   Specimen: BLOOD  Result Value Ref Range Status   Specimen Description BLOOD SITE NOT SPECIFIED  Final   Special Requests   Final    BOTTLES DRAWN AEROBIC AND ANAEROBIC Blood Culture adequate volume   Culture   Final    NO GROWTH 5 DAYS Performed at [facility], [address] [place], [place] [address]    Report Status 04/09/2022 FINAL  Final     Time coordinating discharge: Over 30 minutes  SIGNED:   Shawna Clamp, MD  [facility] 04/14/2022, 4:25 PM Pager   If 7PM-7AM, please contact night-coverage

## 2022-04-14 NOTE — Progress Notes (Signed)
Palliative Medicine Progress Note   Patient Name: Tom Ortiz       Date: 04/14/2022 DOB: 15-Sep-1927  Age: 89 y.o. MRN#: [id number] Attending Physician: Shawna Clamp, MD Primary Care Physician: Pcp, No Admit Date: 04/04/2022   HPI/Patient Profile: 89 y.o. male  with past medical history of dementia, hearing impairment, and paroxysmal atrial fibrillation not on anticoagulation who presented to [facility] ED on 04/04/2022 with weakness for about 2 weeks.  He was noted to be hypotensive and tachycardic when EMS evaluated him.  In the ED, he was found to be in atrial fibrillation with RVR.  He was also found to have a pulmonary embolism and was started on heparin.  Admitted to [facility] service.   Palliative Medicine was consulted for goals of care and patient was transitioned to comfort care on 1/25.  Subjective: Patient appears comfortable. He opens his eyes to voice. No non-verbal signs of pain or discomfort noted. Respirations are even and unlabored. No excessive respiratory secretions noted.     Objective:  Physical Exam Vitals reviewed.  Constitutional:      General: He is sleeping. He is not in acute distress.    Appearance: He is ill-appearing.  Pulmonary:     Effort: Pulmonary effort is normal.              Palliative Medicine Assessment & Plan   Assessment: Principal Problem:   Chronic atrial fibrillation with RVR (HCC) Active Problems:   HTN (hypertension)   Hyperlipidemia   Acute pulmonary embolism (HCC)   Acute respiratory failure with hypoxia (HCC)   Lactic acid acidosis   AKI (acute kidney injury) (HCC)   Elevated troponin   Acute metabolic encephalopathy   CAD (coronary artery disease)   Protein-calorie malnutrition, severe    Recommendations/Plan: Continue comfort  measures Olanzapine 5 mg at bedtime Fentanyl 12 mcg patch every 72 hours Awaiting bed at hospice facility in [place] Status: DNR   Prognosis:  < 2 weeks  Discharge Planning: Hospice facility    Thank you for allowing the Palliative Medicine Team to assist in the care of this patient.   MDM - moderate   Lavena Bullion, NP   Please contact Palliative Medicine Team phone at [phone] for questions and concerns.  For individual providers, please see AMION.

## 2022-04-14 NOTE — TOC Progression Note (Addendum)
Transition of Care [facility]) - Progression Note    Patient Details  Name: Tom Ortiz MRN: [id number] Date of Birth: [birth year]  Transition of Care [facility]) CM/SW [place], RN Phone Number: 04/14/2022, 8:44 AM  Clinical Narrative:    CM noted patient was seen by Palliative Care regarding Comfort care transition and request for Inpatient Hospice Placement.  I called the son, Tom Ortiz this morning and discuss choice regarding Inpatient Hospice facility placement - the son requests [facility] location.  I called Webb Silversmith, CM with [facility] and she will follow up with the patient's son after 10 am this morning.  CM will continue to follow the patient for needed Inpatient Hospice placement.  04/14/2022 1611- Webb Silversmith, CM with [facility] has offered an inpatient bed to the patient.  MD is aware and will be completing discharge summary.  [facility] was called and transportation was arranged for 530 pm to allow time for physician to complete discharge paperwork.  Bedside nursing - please call [facility] and give nursing report - [phone].   Expected Discharge Plan: [place] Barriers to Discharge: Continued Medical Work up  Expected Discharge Plan and Services In-house Referral: Clinical Social Work Discharge Planning Services: CM Consult Post Acute Care Choice: Residential Hospice Bed Living arrangements for the past 2 months: Single Family Home                                       Social Determinants of Health (SDOH) Interventions SDOH Screenings   Food Insecurity: No Food Insecurity (04/11/2022)  Housing: Low Risk  (04/11/2022)  Utilities: Not At Risk (04/11/2022)  Tobacco Use: Medium Risk (04/05/2022)    Readmission Risk Interventions    04/14/2022    8:43 AM  Readmission Risk Prevention Plan  Transportation Screening Complete  PCP or Specialist  Appt within 5-7 Days Complete  Home Care Screening Complete  Medication Review (RN CM) Complete

## 2022-04-16 DIAGNOSIS — F0390 Unspecified dementia without behavioral disturbance: Secondary | ICD-10-CM | POA: Diagnosis not present

## 2022-04-16 DIAGNOSIS — E43 Unspecified severe protein-calorie malnutrition: Secondary | ICD-10-CM | POA: Diagnosis not present

## 2022-04-16 DIAGNOSIS — R1310 Dysphagia, unspecified: Secondary | ICD-10-CM | POA: Diagnosis not present

## 2022-04-16 DIAGNOSIS — I2699 Other pulmonary embolism without acute cor pulmonale: Secondary | ICD-10-CM | POA: Diagnosis not present

## 2022-04-16 DIAGNOSIS — Z87448 Personal history of other diseases of urinary system: Secondary | ICD-10-CM | POA: Diagnosis not present

## 2022-04-16 DIAGNOSIS — I4891 Unspecified atrial fibrillation: Secondary | ICD-10-CM | POA: Diagnosis not present

## 2022-05-15 DEATH — deceased
